# Patient Record
Sex: Male | Born: 1935 | Race: White | Hispanic: No | Marital: Married | Smoking: Former smoker
Health system: Southern US, Community
[De-identification: ages and names within clinical notes are randomized; demographics above are authoritative.]

## PROBLEM LIST (undated history)

## (undated) DIAGNOSIS — G4733 Obstructive sleep apnea (adult) (pediatric): Secondary | ICD-10-CM

## (undated) DIAGNOSIS — E782 Mixed hyperlipidemia: Secondary | ICD-10-CM

## (undated) DIAGNOSIS — N400 Enlarged prostate without lower urinary tract symptoms: Secondary | ICD-10-CM

## (undated) DIAGNOSIS — D649 Anemia, unspecified: Secondary | ICD-10-CM

## (undated) DIAGNOSIS — I10 Essential (primary) hypertension: Secondary | ICD-10-CM

## (undated) DIAGNOSIS — M545 Low back pain, unspecified: Secondary | ICD-10-CM

## (undated) DIAGNOSIS — J309 Allergic rhinitis, unspecified: Secondary | ICD-10-CM

## (undated) DIAGNOSIS — L438 Other lichen planus: Secondary | ICD-10-CM

## (undated) DIAGNOSIS — D126 Benign neoplasm of colon, unspecified: Secondary | ICD-10-CM

## (undated) DIAGNOSIS — M1990 Unspecified osteoarthritis, unspecified site: Secondary | ICD-10-CM

## (undated) DIAGNOSIS — N200 Calculus of kidney: Secondary | ICD-10-CM

## (undated) DIAGNOSIS — J449 Chronic obstructive pulmonary disease, unspecified: Secondary | ICD-10-CM

## (undated) DIAGNOSIS — K5730 Diverticulosis of large intestine without perforation or abscess without bleeding: Secondary | ICD-10-CM

## (undated) DIAGNOSIS — K219 Gastro-esophageal reflux disease without esophagitis: Secondary | ICD-10-CM

## (undated) DIAGNOSIS — K589 Irritable bowel syndrome without diarrhea: Secondary | ICD-10-CM

## (undated) DIAGNOSIS — F419 Anxiety disorder, unspecified: Secondary | ICD-10-CM

## (undated) DIAGNOSIS — K635 Polyp of colon: Secondary | ICD-10-CM

## (undated) DIAGNOSIS — C801 Malignant (primary) neoplasm, unspecified: Secondary | ICD-10-CM

## (undated) DIAGNOSIS — E119 Type 2 diabetes mellitus without complications: Secondary | ICD-10-CM

## (undated) DIAGNOSIS — I2510 Atherosclerotic heart disease of native coronary artery without angina pectoris: Secondary | ICD-10-CM

## (undated) HISTORY — DX: Benign prostatic hyperplasia without lower urinary tract symptoms: N40.0

## (undated) HISTORY — DX: Benign neoplasm of colon, unspecified: D12.6

## (undated) HISTORY — DX: Calculus of kidney: N20.0

## (undated) HISTORY — PX: CHOLECYSTECTOMY: SHX55

## (undated) HISTORY — DX: Obstructive sleep apnea (adult) (pediatric): G47.33

## (undated) HISTORY — DX: Allergic rhinitis, unspecified: J30.9

## (undated) HISTORY — DX: Atherosclerotic heart disease of native coronary artery without angina pectoris: I25.10

## (undated) HISTORY — DX: Mixed hyperlipidemia: E78.2

## (undated) HISTORY — DX: Unspecified osteoarthritis, unspecified site: M19.90

## (undated) HISTORY — DX: Anemia, unspecified: D64.9

## (undated) HISTORY — DX: Chronic obstructive pulmonary disease, unspecified: J44.9

## (undated) HISTORY — DX: Essential (primary) hypertension: I10

## (undated) HISTORY — DX: Low back pain, unspecified: M54.50

## (undated) HISTORY — DX: Type 2 diabetes mellitus without complications: E11.9

## (undated) HISTORY — PX: CATARACT EXTRACTION: SUR2

## (undated) HISTORY — DX: Irritable bowel syndrome, unspecified: K58.9

## (undated) HISTORY — DX: Anxiety disorder, unspecified: F41.9

## (undated) HISTORY — DX: Polyp of colon: K63.5

## (undated) HISTORY — DX: Diverticulosis of large intestine without perforation or abscess without bleeding: K57.30

## (undated) HISTORY — DX: Low back pain: M54.5

## (undated) HISTORY — DX: Gastro-esophageal reflux disease without esophagitis: K21.9

---

## 2000-08-04 ENCOUNTER — Emergency Department (HOSPITAL_COMMUNITY): Admission: EM | Admit: 2000-08-04 | Discharge: 2000-08-04 | Payer: Self-pay | Admitting: Emergency Medicine

## 2000-09-06 ENCOUNTER — Emergency Department (HOSPITAL_COMMUNITY): Admission: EM | Admit: 2000-09-06 | Discharge: 2000-09-06 | Payer: Self-pay | Admitting: Emergency Medicine

## 2001-05-31 ENCOUNTER — Encounter: Admission: RE | Admit: 2001-05-31 | Discharge: 2001-05-31 | Payer: Self-pay | Admitting: Gastroenterology

## 2001-05-31 ENCOUNTER — Encounter (INDEPENDENT_AMBULATORY_CARE_PROVIDER_SITE_OTHER): Payer: Self-pay | Admitting: *Deleted

## 2001-05-31 ENCOUNTER — Encounter: Payer: Self-pay | Admitting: Gastroenterology

## 2001-11-01 ENCOUNTER — Encounter: Payer: Self-pay | Admitting: Gastroenterology

## 2001-11-01 ENCOUNTER — Ambulatory Visit (HOSPITAL_COMMUNITY): Admission: RE | Admit: 2001-11-01 | Discharge: 2001-11-01 | Payer: Self-pay | Admitting: Gastroenterology

## 2001-11-04 ENCOUNTER — Encounter (INDEPENDENT_AMBULATORY_CARE_PROVIDER_SITE_OTHER): Payer: Self-pay | Admitting: *Deleted

## 2001-11-04 ENCOUNTER — Ambulatory Visit (HOSPITAL_COMMUNITY): Admission: RE | Admit: 2001-11-04 | Discharge: 2001-11-04 | Payer: Self-pay | Admitting: Gastroenterology

## 2001-11-04 ENCOUNTER — Encounter: Payer: Self-pay | Admitting: Gastroenterology

## 2003-01-25 ENCOUNTER — Encounter (INDEPENDENT_AMBULATORY_CARE_PROVIDER_SITE_OTHER): Payer: Self-pay | Admitting: Gastroenterology

## 2003-04-27 ENCOUNTER — Ambulatory Visit (HOSPITAL_BASED_OUTPATIENT_CLINIC_OR_DEPARTMENT_OTHER): Admission: RE | Admit: 2003-04-27 | Discharge: 2003-04-27 | Payer: Self-pay | Admitting: Pulmonary Disease

## 2004-07-23 ENCOUNTER — Ambulatory Visit: Payer: Self-pay | Admitting: Pulmonary Disease

## 2004-09-11 ENCOUNTER — Ambulatory Visit: Payer: Self-pay | Admitting: Pulmonary Disease

## 2004-11-07 ENCOUNTER — Ambulatory Visit: Payer: Self-pay | Admitting: Pulmonary Disease

## 2004-11-11 ENCOUNTER — Ambulatory Visit: Payer: Self-pay | Admitting: Pulmonary Disease

## 2004-11-25 ENCOUNTER — Ambulatory Visit: Payer: Self-pay | Admitting: Pulmonary Disease

## 2004-12-19 ENCOUNTER — Ambulatory Visit: Payer: Self-pay | Admitting: Pulmonary Disease

## 2005-05-04 ENCOUNTER — Ambulatory Visit: Payer: Self-pay | Admitting: Pulmonary Disease

## 2005-06-23 ENCOUNTER — Ambulatory Visit: Payer: Self-pay | Admitting: Pulmonary Disease

## 2005-06-29 ENCOUNTER — Ambulatory Visit: Payer: Self-pay | Admitting: Gastroenterology

## 2005-07-02 ENCOUNTER — Ambulatory Visit: Payer: Self-pay | Admitting: Pulmonary Disease

## 2005-07-27 ENCOUNTER — Ambulatory Visit: Payer: Self-pay | Admitting: Gastroenterology

## 2005-08-26 ENCOUNTER — Ambulatory Visit: Payer: Self-pay | Admitting: Pulmonary Disease

## 2005-09-03 ENCOUNTER — Ambulatory Visit: Payer: Self-pay | Admitting: Pulmonary Disease

## 2006-01-04 ENCOUNTER — Ambulatory Visit: Payer: Self-pay | Admitting: Pulmonary Disease

## 2006-01-12 ENCOUNTER — Ambulatory Visit: Payer: Self-pay | Admitting: Pulmonary Disease

## 2006-01-13 ENCOUNTER — Ambulatory Visit: Payer: Self-pay | Admitting: Gastroenterology

## 2006-01-14 ENCOUNTER — Encounter (INDEPENDENT_AMBULATORY_CARE_PROVIDER_SITE_OTHER): Payer: Self-pay | Admitting: *Deleted

## 2006-01-14 ENCOUNTER — Ambulatory Visit: Payer: Self-pay | Admitting: Gastroenterology

## 2006-01-29 ENCOUNTER — Ambulatory Visit: Payer: Self-pay | Admitting: Internal Medicine

## 2006-01-29 ENCOUNTER — Ambulatory Visit: Payer: Self-pay | Admitting: Pulmonary Disease

## 2006-02-15 ENCOUNTER — Ambulatory Visit (HOSPITAL_COMMUNITY): Admission: RE | Admit: 2006-02-15 | Discharge: 2006-02-15 | Payer: Self-pay | Admitting: Internal Medicine

## 2006-02-15 ENCOUNTER — Encounter (INDEPENDENT_AMBULATORY_CARE_PROVIDER_SITE_OTHER): Payer: Self-pay | Admitting: Specialist

## 2006-02-15 ENCOUNTER — Encounter: Payer: Self-pay | Admitting: Internal Medicine

## 2006-02-19 ENCOUNTER — Ambulatory Visit: Payer: Self-pay | Admitting: Internal Medicine

## 2006-03-16 ENCOUNTER — Ambulatory Visit: Payer: Self-pay | Admitting: Pulmonary Disease

## 2006-05-10 ENCOUNTER — Emergency Department (HOSPITAL_COMMUNITY): Admission: EM | Admit: 2006-05-10 | Discharge: 2006-05-10 | Payer: Self-pay | Admitting: Emergency Medicine

## 2006-06-29 ENCOUNTER — Ambulatory Visit: Payer: Self-pay | Admitting: Pulmonary Disease

## 2006-07-20 ENCOUNTER — Ambulatory Visit: Payer: Self-pay | Admitting: Pulmonary Disease

## 2006-07-23 ENCOUNTER — Ambulatory Visit (HOSPITAL_COMMUNITY): Admission: RE | Admit: 2006-07-23 | Discharge: 2006-07-23 | Payer: Self-pay | Admitting: Pulmonary Disease

## 2006-12-27 ENCOUNTER — Ambulatory Visit: Payer: Self-pay | Admitting: Pulmonary Disease

## 2007-01-07 ENCOUNTER — Ambulatory Visit: Payer: Self-pay | Admitting: Pulmonary Disease

## 2007-01-11 ENCOUNTER — Ambulatory Visit: Payer: Self-pay | Admitting: Pulmonary Disease

## 2007-01-11 LAB — CONVERTED CEMR LAB
Albumin: 4.1 g/dL (ref 3.5–5.2)
Basophils Absolute: 0 10*3/uL (ref 0.0–0.1)
Cholesterol: 160 mg/dL (ref 0–200)
Creatinine, Ser: 0.8 mg/dL (ref 0.4–1.5)
GFR calc Af Amer: 123 mL/min
HCT: 43.5 % (ref 39.0–52.0)
HDL: 37.9 mg/dL — ABNORMAL LOW (ref >39.0)
Hemoglobin: 14.7 g/dL (ref 13.0–17.0)
Hgb A1c MFr Bld: 6.9 % — ABNORMAL HIGH (ref 4.6–6.0)
LDL Cholesterol: 103 mg/dL — ABNORMAL HIGH (ref 0–99)
Lymphocytes Relative: 22.8 % (ref 12.0–46.0)
MCHC: 33.7 g/dL (ref 30.0–36.0)
MCV: 87.9 fL (ref 78.0–100.0)
Monocytes Absolute: 0.5 10*3/uL (ref 0.2–0.7)
Neutro Abs: 4.3 10*3/uL (ref 1.4–7.7)
Neutrophils Relative %: 68.3 % (ref 43.0–77.0)
PSA: 0.59 ng/mL (ref 0.10–4.00)
Potassium: 4.4 meq/L (ref 3.5–5.1)
RDW: 12.1 % (ref 11.5–14.6)
Sodium: 145 meq/L (ref 135–145)
Total Bilirubin: 0.8 mg/dL (ref 0.3–1.2)
Total Protein: 7.4 g/dL (ref 6.0–8.3)

## 2007-01-18 ENCOUNTER — Ambulatory Visit: Payer: Self-pay | Admitting: Internal Medicine

## 2007-06-29 ENCOUNTER — Ambulatory Visit: Payer: Self-pay | Admitting: Pulmonary Disease

## 2007-07-13 ENCOUNTER — Ambulatory Visit: Payer: Self-pay | Admitting: Pulmonary Disease

## 2007-07-13 LAB — CONVERTED CEMR LAB
BUN: 15 mg/dL (ref 6–23)
Chloride: 106 meq/L (ref 96–112)
Creatinine, Ser: 0.8 mg/dL (ref 0.4–1.5)
Hgb A1c MFr Bld: 6.7 % — ABNORMAL HIGH (ref 4.6–6.0)
VLDL: 18 mg/dL (ref 0–40)

## 2007-07-22 DIAGNOSIS — I10 Essential (primary) hypertension: Secondary | ICD-10-CM

## 2007-07-22 DIAGNOSIS — K589 Irritable bowel syndrome without diarrhea: Secondary | ICD-10-CM

## 2007-07-22 DIAGNOSIS — G4733 Obstructive sleep apnea (adult) (pediatric): Secondary | ICD-10-CM

## 2007-07-22 DIAGNOSIS — J441 Chronic obstructive pulmonary disease with (acute) exacerbation: Secondary | ICD-10-CM

## 2007-07-22 DIAGNOSIS — Z87442 Personal history of urinary calculi: Secondary | ICD-10-CM

## 2007-07-22 DIAGNOSIS — F411 Generalized anxiety disorder: Secondary | ICD-10-CM

## 2007-07-22 DIAGNOSIS — M545 Low back pain: Secondary | ICD-10-CM

## 2007-07-22 DIAGNOSIS — K219 Gastro-esophageal reflux disease without esophagitis: Secondary | ICD-10-CM

## 2007-07-22 DIAGNOSIS — D126 Benign neoplasm of colon, unspecified: Secondary | ICD-10-CM | POA: Insufficient documentation

## 2007-07-22 DIAGNOSIS — J309 Allergic rhinitis, unspecified: Secondary | ICD-10-CM | POA: Insufficient documentation

## 2007-07-22 DIAGNOSIS — D649 Anemia, unspecified: Secondary | ICD-10-CM | POA: Insufficient documentation

## 2007-07-25 ENCOUNTER — Ambulatory Visit: Payer: Self-pay | Admitting: Pulmonary Disease

## 2007-08-30 ENCOUNTER — Telehealth: Payer: Self-pay | Admitting: Pulmonary Disease

## 2007-09-19 ENCOUNTER — Ambulatory Visit: Payer: Self-pay | Admitting: Pulmonary Disease

## 2007-10-03 ENCOUNTER — Emergency Department (HOSPITAL_COMMUNITY): Admission: EM | Admit: 2007-10-03 | Discharge: 2007-10-04 | Payer: Self-pay | Admitting: Emergency Medicine

## 2007-10-17 ENCOUNTER — Encounter (INDEPENDENT_AMBULATORY_CARE_PROVIDER_SITE_OTHER): Payer: Self-pay | Admitting: *Deleted

## 2007-10-17 ENCOUNTER — Inpatient Hospital Stay (HOSPITAL_COMMUNITY): Admission: EM | Admit: 2007-10-17 | Discharge: 2007-10-20 | Payer: Self-pay | Admitting: Emergency Medicine

## 2007-10-17 ENCOUNTER — Ambulatory Visit: Payer: Self-pay | Admitting: Pulmonary Disease

## 2007-10-20 ENCOUNTER — Encounter (INDEPENDENT_AMBULATORY_CARE_PROVIDER_SITE_OTHER): Payer: Self-pay | Admitting: *Deleted

## 2007-10-21 ENCOUNTER — Telehealth: Payer: Self-pay | Admitting: Pulmonary Disease

## 2007-10-28 ENCOUNTER — Ambulatory Visit: Payer: Self-pay | Admitting: Pulmonary Disease

## 2007-12-14 ENCOUNTER — Encounter: Payer: Self-pay | Admitting: Pulmonary Disease

## 2008-01-11 ENCOUNTER — Ambulatory Visit: Payer: Self-pay | Admitting: Pulmonary Disease

## 2008-01-11 DIAGNOSIS — H9190 Unspecified hearing loss, unspecified ear: Secondary | ICD-10-CM | POA: Insufficient documentation

## 2008-01-11 DIAGNOSIS — K5730 Diverticulosis of large intestine without perforation or abscess without bleeding: Secondary | ICD-10-CM

## 2008-01-15 LAB — CONVERTED CEMR LAB
AST: 22 units/L (ref 0–37)
Basophils Absolute: 0 10*3/uL (ref 0.0–0.1)
Basophils Relative: 0.9 % (ref 0.0–1.0)
Chloride: 108 meq/L (ref 96–112)
Cholesterol: 160 mg/dL (ref 0–200)
Creatinine, Ser: 0.8 mg/dL (ref 0.4–1.5)
Eosinophils Absolute: 0.1 10*3/uL (ref 0.0–0.7)
GFR calc non Af Amer: 102 mL/min
HDL: 25.7 mg/dL — ABNORMAL LOW (ref >39.0)
Hgb A1c MFr Bld: 6.6 % — ABNORMAL HIGH (ref 4.6–6.0)
MCHC: 33.7 g/dL (ref 30.0–36.0)
MCV: 87.9 fL (ref 78.0–100.0)
Neutrophils Relative %: 62.8 % (ref 43.0–77.0)
Platelets: 224 10*3/uL (ref 150–400)
Potassium: 4.6 meq/L (ref 3.5–5.1)
Total Bilirubin: 0.6 mg/dL (ref 0.3–1.2)
Triglycerides: 106 mg/dL (ref 0–149)
VLDL: 21 mg/dL (ref 0–40)

## 2008-01-19 ENCOUNTER — Ambulatory Visit: Payer: Self-pay | Admitting: Internal Medicine

## 2008-01-26 ENCOUNTER — Ambulatory Visit (HOSPITAL_COMMUNITY): Admission: RE | Admit: 2008-01-26 | Discharge: 2008-01-26 | Payer: Self-pay | Admitting: Internal Medicine

## 2008-02-07 ENCOUNTER — Telehealth (INDEPENDENT_AMBULATORY_CARE_PROVIDER_SITE_OTHER): Payer: Self-pay | Admitting: *Deleted

## 2008-02-16 ENCOUNTER — Ambulatory Visit: Payer: Self-pay | Admitting: Internal Medicine

## 2008-06-06 ENCOUNTER — Ambulatory Visit: Payer: Self-pay | Admitting: Pulmonary Disease

## 2008-07-10 ENCOUNTER — Ambulatory Visit: Payer: Self-pay | Admitting: Pulmonary Disease

## 2008-07-10 DIAGNOSIS — E782 Mixed hyperlipidemia: Secondary | ICD-10-CM | POA: Insufficient documentation

## 2008-07-10 DIAGNOSIS — M1990 Unspecified osteoarthritis, unspecified site: Secondary | ICD-10-CM

## 2008-07-24 ENCOUNTER — Telehealth (INDEPENDENT_AMBULATORY_CARE_PROVIDER_SITE_OTHER): Payer: Self-pay | Admitting: *Deleted

## 2008-07-24 LAB — CONVERTED CEMR LAB
BUN: 11 mg/dL (ref 6–23)
CO2: 28 meq/L (ref 19–32)
Chloride: 108 meq/L (ref 96–112)
Cholesterol: 155 mg/dL (ref 0–200)
Creatinine, Ser: 0.7 mg/dL (ref 0.4–1.5)
GFR calc non Af Amer: 118 mL/min
Glucose, Bld: 131 mg/dL — ABNORMAL HIGH (ref 70–99)
LDL Cholesterol: 106 mg/dL — ABNORMAL HIGH (ref 0–99)
Triglycerides: 78 mg/dL (ref 0–149)

## 2008-07-29 DIAGNOSIS — N138 Other obstructive and reflux uropathy: Secondary | ICD-10-CM

## 2008-07-29 DIAGNOSIS — N401 Enlarged prostate with lower urinary tract symptoms: Secondary | ICD-10-CM

## 2008-08-20 ENCOUNTER — Ambulatory Visit: Payer: Self-pay | Admitting: Pulmonary Disease

## 2008-09-26 ENCOUNTER — Encounter (INDEPENDENT_AMBULATORY_CARE_PROVIDER_SITE_OTHER): Payer: Self-pay | Admitting: *Deleted

## 2008-11-12 HISTORY — PX: SKIN CANCER EXCISION: SHX779

## 2008-11-22 ENCOUNTER — Telehealth (INDEPENDENT_AMBULATORY_CARE_PROVIDER_SITE_OTHER): Payer: Self-pay | Admitting: *Deleted

## 2008-12-21 ENCOUNTER — Telehealth: Payer: Self-pay | Admitting: Pulmonary Disease

## 2008-12-30 ENCOUNTER — Emergency Department (HOSPITAL_BASED_OUTPATIENT_CLINIC_OR_DEPARTMENT_OTHER): Admission: EM | Admit: 2008-12-30 | Discharge: 2008-12-31 | Payer: Self-pay | Admitting: Emergency Medicine

## 2008-12-30 ENCOUNTER — Encounter (INDEPENDENT_AMBULATORY_CARE_PROVIDER_SITE_OTHER): Payer: Self-pay | Admitting: *Deleted

## 2008-12-30 ENCOUNTER — Ambulatory Visit: Payer: Self-pay | Admitting: Diagnostic Radiology

## 2009-01-01 ENCOUNTER — Ambulatory Visit: Payer: Self-pay | Admitting: Pulmonary Disease

## 2009-01-01 DIAGNOSIS — K5289 Other specified noninfective gastroenteritis and colitis: Secondary | ICD-10-CM

## 2009-01-09 ENCOUNTER — Encounter (INDEPENDENT_AMBULATORY_CARE_PROVIDER_SITE_OTHER): Payer: Self-pay | Admitting: *Deleted

## 2009-01-09 ENCOUNTER — Ambulatory Visit: Payer: Self-pay | Admitting: Pulmonary Disease

## 2009-01-13 LAB — CONVERTED CEMR LAB
ALT: 17 units/L (ref 0–53)
Alkaline Phosphatase: 77 units/L (ref 39–117)
Basophils Relative: 1 % (ref 0.0–3.0)
Bilirubin, Direct: 0.1 mg/dL (ref 0.0–0.3)
Calcium: 9.2 mg/dL (ref 8.4–10.5)
Creatinine, Ser: 0.7 mg/dL (ref 0.4–1.5)
Eosinophils Absolute: 0 10*3/uL (ref 0.0–0.7)
Eosinophils Relative: 1 % (ref 0.0–5.0)
HDL: 29.5 mg/dL — ABNORMAL LOW (ref >39.00)
LDL Cholesterol: 91 mg/dL (ref 0–99)
Lymphocytes Relative: 23 % (ref 12.0–46.0)
Neutrophils Relative %: 67.5 % (ref 43.0–77.0)
PSA: 1.09 ng/mL (ref 0.10–4.00)
Platelets: 200 10*3/uL (ref 150.0–400.0)
RBC: 4.39 M/uL (ref 4.22–5.81)
Total Bilirubin: 0.7 mg/dL (ref 0.3–1.2)
Total CHOL/HDL Ratio: 5
Total Protein: 7.3 g/dL (ref 6.0–8.3)
Triglycerides: 63 mg/dL (ref 0.0–149.0)
VLDL: 12.6 mg/dL (ref 0.0–40.0)
WBC: 4.7 10*3/uL (ref 4.5–10.5)

## 2009-01-14 ENCOUNTER — Encounter: Payer: Self-pay | Admitting: Pulmonary Disease

## 2009-01-17 ENCOUNTER — Encounter: Payer: Self-pay | Admitting: Pulmonary Disease

## 2009-02-15 ENCOUNTER — Ambulatory Visit: Payer: Self-pay | Admitting: Cardiology

## 2009-02-15 DIAGNOSIS — I2510 Atherosclerotic heart disease of native coronary artery without angina pectoris: Secondary | ICD-10-CM | POA: Insufficient documentation

## 2009-03-12 ENCOUNTER — Ambulatory Visit: Payer: Self-pay | Admitting: Cardiology

## 2009-03-26 ENCOUNTER — Encounter: Payer: Self-pay | Admitting: Emergency Medicine

## 2009-03-26 ENCOUNTER — Ambulatory Visit: Payer: Self-pay | Admitting: Radiology

## 2009-03-27 ENCOUNTER — Ambulatory Visit: Payer: Self-pay | Admitting: Pulmonary Disease

## 2009-03-27 ENCOUNTER — Encounter (INDEPENDENT_AMBULATORY_CARE_PROVIDER_SITE_OTHER): Payer: Self-pay | Admitting: *Deleted

## 2009-03-27 ENCOUNTER — Ambulatory Visit: Payer: Self-pay | Admitting: Internal Medicine

## 2009-03-27 ENCOUNTER — Inpatient Hospital Stay (HOSPITAL_COMMUNITY): Admission: EM | Admit: 2009-03-27 | Discharge: 2009-03-27 | Payer: Self-pay | Admitting: Pulmonary Disease

## 2009-04-15 ENCOUNTER — Ambulatory Visit: Payer: Self-pay | Admitting: Internal Medicine

## 2009-04-15 DIAGNOSIS — K5900 Constipation, unspecified: Secondary | ICD-10-CM | POA: Insufficient documentation

## 2009-04-15 DIAGNOSIS — K3184 Gastroparesis: Secondary | ICD-10-CM

## 2009-04-17 ENCOUNTER — Ambulatory Visit: Payer: Self-pay | Admitting: Pulmonary Disease

## 2009-06-21 ENCOUNTER — Ambulatory Visit: Payer: Self-pay | Admitting: Pulmonary Disease

## 2009-07-11 ENCOUNTER — Ambulatory Visit: Payer: Self-pay | Admitting: Pulmonary Disease

## 2009-07-12 LAB — CONVERTED CEMR LAB
ALT: 17 units/L (ref 0–53)
Albumin: 4.3 g/dL (ref 3.5–5.2)
Basophils Relative: 1 % (ref 0.0–3.0)
Bilirubin, Direct: 0.2 mg/dL (ref 0.0–0.3)
CO2: 23 meq/L (ref 19–32)
Chloride: 105 meq/L (ref 96–112)
Creatinine, Ser: 0.9 mg/dL (ref 0.4–1.5)
Eosinophils Absolute: 0 10*3/uL (ref 0.0–0.7)
Eosinophils Relative: 0.1 % (ref 0.0–5.0)
HCT: 43.8 % (ref 39.0–52.0)
Hgb A1c MFr Bld: 6.5 % (ref 4.6–6.5)
Lymphs Abs: 0.4 10*3/uL — ABNORMAL LOW (ref 0.7–4.0)
MCHC: 33.9 g/dL (ref 30.0–36.0)
MCV: 90.3 fL (ref 78.0–100.0)
Monocytes Absolute: 0.3 10*3/uL (ref 0.1–1.0)
Neutro Abs: 7.9 10*3/uL — ABNORMAL HIGH (ref 1.4–7.7)
Neutrophils Relative %: 90.7 % — ABNORMAL HIGH (ref 43.0–77.0)
Potassium: 4.7 meq/L (ref 3.5–5.1)
RBC: 4.85 M/uL (ref 4.22–5.81)
Sed Rate: 24 mm/hr — ABNORMAL HIGH (ref 0–22)
Total Protein: 7.8 g/dL (ref 6.0–8.3)
WBC: 8.7 10*3/uL (ref 4.5–10.5)

## 2009-07-15 HISTORY — PX: KNEE SURGERY: SHX244

## 2009-07-18 ENCOUNTER — Telehealth: Payer: Self-pay | Admitting: Pulmonary Disease

## 2009-08-05 ENCOUNTER — Telehealth (INDEPENDENT_AMBULATORY_CARE_PROVIDER_SITE_OTHER): Payer: Self-pay | Admitting: *Deleted

## 2009-08-20 ENCOUNTER — Ambulatory Visit: Payer: Self-pay | Admitting: Pulmonary Disease

## 2009-08-20 ENCOUNTER — Telehealth (INDEPENDENT_AMBULATORY_CARE_PROVIDER_SITE_OTHER): Payer: Self-pay | Admitting: *Deleted

## 2009-08-21 ENCOUNTER — Encounter: Payer: Self-pay | Admitting: Pulmonary Disease

## 2009-09-20 ENCOUNTER — Encounter: Payer: Self-pay | Admitting: Pulmonary Disease

## 2009-11-08 ENCOUNTER — Encounter: Payer: Self-pay | Admitting: Cardiology

## 2009-11-11 ENCOUNTER — Ambulatory Visit: Payer: Self-pay | Admitting: Cardiology

## 2009-11-11 DIAGNOSIS — E663 Overweight: Secondary | ICD-10-CM | POA: Insufficient documentation

## 2009-12-12 ENCOUNTER — Telehealth (INDEPENDENT_AMBULATORY_CARE_PROVIDER_SITE_OTHER): Payer: Self-pay | Admitting: *Deleted

## 2010-01-09 ENCOUNTER — Ambulatory Visit: Payer: Self-pay | Admitting: Pulmonary Disease

## 2010-01-11 LAB — CONVERTED CEMR LAB
Alkaline Phosphatase: 87 units/L (ref 39–117)
Basophils Relative: 0.8 % (ref 0.0–3.0)
Bilirubin, Direct: 0.1 mg/dL (ref 0.0–0.3)
CO2: 28 meq/L (ref 19–32)
Calcium: 9.4 mg/dL (ref 8.4–10.5)
Creatinine, Ser: 0.8 mg/dL (ref 0.4–1.5)
Eosinophils Absolute: 0 10*3/uL (ref 0.0–0.7)
GFR calc non Af Amer: 100.91 mL/min (ref >60)
HCT: 40 % (ref 39.0–52.0)
HDL: 38.1 mg/dL — ABNORMAL LOW (ref >39.00)
Hemoglobin: 13.4 g/dL (ref 13.0–17.0)
Hgb A1c MFr Bld: 6.5 % (ref 4.6–6.5)
Lymphocytes Relative: 30 % (ref 12.0–46.0)
Lymphs Abs: 1.3 10*3/uL (ref 0.7–4.0)
MCHC: 33.4 g/dL (ref 30.0–36.0)
Monocytes Relative: 9.1 % (ref 3.0–12.0)
Neutro Abs: 2.6 10*3/uL (ref 1.4–7.7)
RBC: 4.53 M/uL (ref 4.22–5.81)
RDW: 13.3 % (ref 11.5–14.6)
Sodium: 142 meq/L (ref 135–145)
TSH: 1.41 microintl units/mL (ref 0.35–5.50)
Total CHOL/HDL Ratio: 4
Total Protein: 7.4 g/dL (ref 6.0–8.3)
VLDL: 12.2 mg/dL (ref 0.0–40.0)

## 2010-01-16 ENCOUNTER — Encounter: Payer: Self-pay | Admitting: Pulmonary Disease

## 2010-06-06 ENCOUNTER — Ambulatory Visit: Payer: Self-pay | Admitting: Pulmonary Disease

## 2010-07-10 ENCOUNTER — Ambulatory Visit: Payer: Self-pay | Admitting: Pulmonary Disease

## 2010-07-12 LAB — CONVERTED CEMR LAB
BUN: 16 mg/dL (ref 6–23)
Calcium: 9.2 mg/dL (ref 8.4–10.5)
Cholesterol: 155 mg/dL (ref 0–200)
GFR calc non Af Amer: 110.25 mL/min (ref >60)
Glucose, Bld: 118 mg/dL — ABNORMAL HIGH (ref 70–99)
Hgb A1c MFr Bld: 7.1 % — ABNORMAL HIGH (ref 4.6–6.5)

## 2010-10-01 ENCOUNTER — Encounter: Payer: Self-pay | Admitting: Pulmonary Disease

## 2010-10-16 NOTE — Assessment & Plan Note (Signed)
Summary: flu shot ///kp   Nurse Visit Flu Vaccine Consent Questions     Do you have a history of severe allergic reactions to this vaccine? no    Any prior history of allergic reactions to egg and/or gelatin? no    Do you have a sensitivity to the preservative Thimersol? no    Do you have a past history of Guillan-Barre Syndrome? no    Do you currently have an acute febrile illness? no    Have you ever had a severe reaction to latex? no    Vaccine information given and explained to patient? yes    Are you currently pregnant? no    Lot Number:[id number]   Exp Date:03/14/2011   Site Given  Right Deltoid IM .Kandice Hams [facility]  June 06, 2010 3:32 PM    Allergies: 1)  ! Prednisone  Orders Added: 1)  Flu Vaccine 34yrs + MEDICARE PATIENTS [Q2039] 2)  Administration Flu vaccine - MCR [G0008]

## 2010-10-16 NOTE — Assessment & Plan Note (Signed)
Summary: 6 months/apc   Primary Care Provider:  Lorin Picket Nadel,MD  CC:  5 month ROV & review of mult medical problems....  History of Present Illness: 75 y/o WM w/ mult med problems... he has multiple medical problems as noted below...     ~  Apr10:  seen recently in ER w/ ?temp, "felt bad", weak- eval w/ CXR, CT Brain, labs- all neg... given rest, fluids, tylenol and improved... c/o gas and we discussed Mylicon, Gas-X, Phazyme regularly...  CTAbd showed coronary calcifications & this plus his known risk factors>>> referred to Cards for eval.  ~  saw DrHochrein 6/10 w/ normal EKG, neg CV exam- screened w/ treadmill which was adeq & felt to be neg- rec> primary risk factor reduction strategy...  ~  Aug10:  he went to the ER 03/27/09 w/ epig pain, N &V... Xray & Labs neg- consult from DrGessner/ GI felt to be flair of his gastroparesis (given Compazine suppos for Prn use)... he had follow up w/ DrPerry several days ago & his Reglan was discontinued... feels well now- no pain/ discomfort, no n/v/d/c/ or blood seen....   ~  Dec10:  add-on visit for repeat sinus infection- c/o congestion, drainage, etc... prev Rx w/ ZPak, then Avelox, & now rec> sinus films (chr right max sinus), Augmentin, Depo80, Dosepak, Claritin/ Mucinex/ Saline/ Flonase... we will refer to ENT for their opinion (DrShoemaker 1/11> symptoms resolved, routine f/u).   ~  January 09, 2010:  he's had alot going on w/ left knee surg 11/10 DrDuda to "clean it out", the series of 3 "liq cartilage" shots 2/11> starting to feel sl better & incr exercise... has infection in toe on Keflex... had Squamous cell ca removed left hand... prev sinusitis resolved w/ Augmentin & Dosepak... he saw DrHochrein for f/u 2/11> stable, no angina, BP OK, no change in Rx...    Current Problem List:  HEARING LOSS (ICD-389.9) - he has bilat hearing aides...  ALLERGIC RHINITIS (ICD-477.9) - on allergy shots from DrESL Laney Pastor)... plus Claritin, Saline, Mucinex,  Flonase.  OBSTRUCTIVE SLEEP APNEA (ICD-327.23) - sleep study 8/04 showed RDI= 45 & desat to 88%... mod snoring and leg jerks without sleep disruption... CPAP perscribed but not using it now- states he can't sleep w/ it on & furthermore he is resting well as long as he stays on his side... denies daytime hypersomnolence & not interested in re-evaluation.  COPD (ICD-496) - ex-smoker, quit 1981... on ADVAIR 100Bid, PROAIR Prn, MUCINEX Bid... he had Pneumovax in 2008... doesn't like Pred Rx... doing well without cough, sputum, change in dyspnea, etc.  HYPERTENSION (ICD-401.9) - on diet alone, low sodium + ASA 81mg /d... BP= 140/74 here today & even better at home he says... denies HA, fatigue, visual changes, CP, palipit, dizziness, syncope, dyspnea, edema, etc...  R/O CAD (ICD-414.00) - hx neg cardiac cath 1988 by Fransisco Beau; now followed by DrHochrein- notes reviewed.  ~  CT Abd 4/10 via ER showed coronary calcif... referred to Cards.  ~  NuclearStressTest 10/02 was norm- no scar or ischemia, EF=55%.  ~  Eval by DrHochrein 6/10 showed norm EKG & cardiac exam... treadmill ordered.  ~  Treadmill 6/10 showed reasonable exerc tolerance, no ischemic changes, few PVCs, sl incr BP...  ~  2/11:  f/u DrHochrein & stable, no angina, no changes made.  MIXED HYPERLIPIDEMIA (ICD-272.2) - low HLD and NIASPAN 500mg /d started 4/09...  ~  FLP 4/09 showed TChol 160, TG 106, HDL 26, LDL 113... rec- diet + Niaspan 500/d.  ~  FLP 10/09 showed TChol 155, TG 78, HDL 33, LDL 106...  ~  FLP 4/10 showed TChol 133, TG 63, HDL 30, LDL 91... rec> continue same.  ~  FLP 4/11 showed TChol 144, TG 61, HDL 38, LDL 94  DM (ICD-250.00) - on diet + METFORMIN 500mg Bid... he reports BS at home all in the 120-130's...  ~  labs 10/08 showed BS=140, HgA1c=6.7.[name].  ~  labs 4/09 showed BS= 132, HgA1c= 6.6.Marland KitchenMarland Kitchen rec- same med, better diet...  ~  labs 10/09 showed BS= 131, HgA1c= 6.3.Marland KitchenMarland Kitchen  ~  labs 4/10 showed BS= 117, HgA1c= 6.2.[name]. rec> same  meds/ diet Rx.  ~  Dilated eye exam 01/17/09 by DrShapiro- no retinopathy...  ~  labs 10/10 showed BS= 138, A1c= 6.5  ~  labs 4/11 showed BS= 121, A1c= 6.5  GERD (ICD-530.81) & ? GASTROPARESIS - on PROTONIX 40mg /d, & off Reglan per DrPerry...  ~   last EGD 6/07 by DrPerry showed gastic polyp...  ~  8/10:  given Compazine suppos for gastroparesis flair by [employer].  DIVERTICULOSIS OF COLON (ICD-562.10) - he uses MIRALAX Bid regularly... IRRITABLE BOWEL SYNDROME (ICD-564.1) COLONIC POLYPS (ICD-211.3)  ~  last colonoscopy 5/07 by DrSam showed divertics only... f/u 46yrs.  ~  CT Abd 4/10 in ER showed atx bases, coronary calcif, s/p GB, abdAo calcif, 11mm renal stone, divertics, bilat hip AVN.  RENAL CALCULUS, HX OF (ICD-V13.01) BENIGN PROSTATIC HYPERTROPHY, HX OF (ICD-V13.8) - on FLOMAX 0.4mg /d w/ improved symptoms...  DEGENERATIVE JOINT DISEASE (ICD-715.90) - pt states "I have a bum knee" and eval by DrDuda w/ shots in his left shoulder too... note Abd CT w/ bilat hip AVN noted... takes TYLENOL Prn...  ~  11/10:  s/p left knee arthroscopy by DrDuda... BACK PAIN, LUMBAR (ICD-724.2)  ANXIETY (ICD-300.00) - not currently on meds for nerves.  ANEMIA (ICD-285.9) - prev hx of GIB...  ~  labs 4/09 showed Hg= 14.1  ~  labs 4/10 showed Hg= 13.6  ~  labs 4/11 showed Hg= 13.4  Health Maintenance:  ~  Colonoscopy 5/07 by DrSam... f/u planned 5 yrs.  ~  PSA 4/09 = 1.00;  4/10 = 1.09;  PSA 4/11 = 1.02  ~  Immunizations: Pneumovax in 2008 @ age 53;  Tetanus- given 4/10;  had both Flu shots in 2009.     Allergies: 1)  ! Prednisone  Comments:  Nurse/Medical Assistant: The patient's medications and allergies were reviewed with the patient and were updated in the Medication and Allergy Lists.  Past History:  Past Medical History: HEARING LOSS (ICD-389.9) ALLERGIC RHINITIS (ICD-477.9) OBSTRUCTIVE SLEEP APNEA (ICD-327.23) COPD (ICD-496) HYPERTENSION (ICD-401.9) R/O CAD (ICD-414.00) MIXED  HYPERLIPIDEMIA (ICD-272.2) DM (ICD-250.00) GERD (ICD-530.81) DIVERTICULOSIS OF COLON (ICD-562.10) IRRITABLE BOWEL SYNDROME (ICD-564.1) COLONIC POLYPS (ICD-211.3) RENAL CALCULUS, HX OF (ICD-V13.01) BENIGN PROSTATIC HYPERTROPHY, HX OF (ICD-V13.8) DEGENERATIVE JOINT DISEASE (ICD-715.90) BACK PAIN, LUMBAR (ICD-724.2) ANXIETY (ICD-300.00) ANEMIA (ICD-285.9)  GASTROPARESIS GASTRIC POLYPS HIATAL HERNIA  Past Surgical History: S/P cholecystectomy Cataract surgery Arthroscopic knee surgery left  done nov 2010 squamous cell cancer removed from left hand march 2010  Family History: Reviewed history from 04/15/2009 and no changes required. Father died age 37 w/ pneumonia and irreg heat beat Mother died age 80 from stroke, hx DM & DJD 1 Sibling- brother age 21 w/ HBP & borderline DM... No FH of Colon Cancer:  Social History: Reviewed history from 04/15/2009 and no changes required. Married, wife= [place], 48 yrs, No children ex-smoker, quit in 1981 w/ 23yrs 1+ppd retired from  [employer] Use - no  Review of Systems      See HPI       The patient complains of dyspnea on exertion.  The patient denies anorexia, fever, weight loss, weight gain, vision loss, decreased hearing, hoarseness, chest pain, syncope, peripheral edema, prolonged cough, headaches, hemoptysis, abdominal pain, melena, hematochezia, severe indigestion/heartburn, hematuria, incontinence, muscle weakness, suspicious skin lesions, transient blindness, difficulty walking, depression, unusual weight change, abnormal bleeding, enlarged lymph nodes, and angioedema.    Vital Signs:  Patient profile:   75 year old male Height:      75 inches Weight:      226.25 pounds O2 Sat:      96 % on Room air Temp:     97.1 degrees F oral Pulse rate:   74 / minute BP sitting:   140 / 74  (right arm) Cuff size:   regular  Vitals Entered By: Randell Loop CMA (January 09, 2010 9:14 AM)  O2 Sat at Rest %:  96 O2 Flow:   Room air CC: 5 month ROV & review of mult medical problems... Is Patient Diabetic? Yes Pain Assessment Patient in pain? no      Comments meds updated today   Physical Exam  Additional Exam:  WD, WN, 75 y/o WM in NAD... GENERAL:  Alert & oriented; pleasant & cooperative... HEENT:  [place]/AT, EOM-full, PERRLA, EACs-clear, TMs-wnl, NOSE- pale, congested, THROAT-clear & wnl. NECK:  Supple w/ fairROM; no JVD; normal carotid impulses w/o bruits; no thyromegaly or nodules palpated; no lymphadenopathy. CHEST:  Clear to P & A; without wheezes/ rales/ or rhonchi heard... HEART:  Regular Rhythm; without murmurs/ rubs/ or gallops detected...  ABDOMEN:  Soft & nontender; normal bowel sounds; no organomegaly or masses palpated., no guarding, or rebound EXT: without deformities, mild arthritic changes; no varicose veins/ venous insuffic/ or edema. NEURO:  CN's intact;  no focal neuro deficits... DERM:  No lesions noted; no rash etc...    MISC. Report  Procedure date:  01/09/2010  Findings:      BMP (METABOL)   Sodium                    142 mEq/L                   135-145   Potassium                 4.5 mEq/L                   3.5-5.1   Chloride                  108 mEq/L                   96-112   Carbon Dioxide            28 mEq/L                    19-32   Glucose              [H]  121 mg/dL                   16-10   BUN                       15 mg/dL  6-23   Creatinine                0.8 mg/dL                   0.4-5.4   Calcium                   9.4 mg/dL                   0.9-81.1   GFR                       100.91 mL/min               >60  Hepatic/Liver Function Panel (HEPATIC)   Total Bilirubin           0.8 mg/dL                   9.1-4.7   Direct Bilirubin          0.1 mg/dL                   8.2-9.5   Alkaline Phosphatase      87 U/L                      39-117   AST                       22 U/L                      0-37   ALT                       17 U/L                       0-53   Total Protein             7.4 g/dL                    6.2-1.3   Albumin                   4.2 g/dL                    0.8-6.5  CBC Platelet w/Diff (CBCD)   White Cell Count     [L]  4.4 K/uL                    4.5-10.5   Red Cell Count            4.53 Mil/uL                 4.22-5.81   Hemoglobin                13.4 g/dL                   78.4-69.6   Hematocrit                40.0 %                      39.0-52.0   MCV                       88.4 fl  78.0-100.0   Platelet Count            201.0 K/uL                  150.0-400.0   Neutrophil %              59.0 %                      43.0-77.0   Lymphocyte %              30.0 %                      12.0-46.0   Monocyte %                9.1 %                       3.0-12.0   Eosinophils%              1.1 %                       0.0-5.0   Basophils %               0.8 %                       0.0-3.0  Comments:      Lipid Panel (LIPID)   Cholesterol               144 mg/dL                   9-811   Triglycerides             61.0 mg/dL                  9.1-478.2   HDL                  [L]  95.62 mg/dL                 >13.08   LDL Cholesterol           94 mg/dL                    6-57  TSH (TSH)   FastTSH                   1.41 uIU/mL                 0.35-5.50   Hemoglobin A1C (A1C)   Hemoglobin A1C            6.5 %                       4.6-6.5   Prostate Specific Antigen (PSA)   PSA-Hyb                   1.02 ng/mL                  0.10-4.00   Impression & Recommendations:  Problem # 1:  COPD (ICD-496) Stable on Advair, Proair, Mucinex... His updated medication list for this problem includes:    Advair Diskus 100-50 Mcg/dose Misc (Fluticasone-salmeterol) .[name]... Take one puff daily  Problem # 2:  R/O CAD (ICD-414.00) Followed by DrHochrein... BP has been good on diet alone... no angina.Marland KitchenMarland Kitchen  His updated medication list for this problem includes:    Adult Aspirin Ec Low Strength 81 Mg Tbec  (Aspirin) .[name]... Take 1 tablet by mouth once a day  Problem # 3:  MIXED HYPERLIPIDEMIA (ICD-272.2) FLP looks good on med-  continue this + diet/ exerc... His updated medication list for this problem includes:    Niaspan 500 Mg Cr-tabs (Niacin (antihyperlipidemic)) .[name]... Take 1 tab by mouth at bedtime...  Orders: TLB-BMP (Basic Metabolic Panel-BMET) (80048-METABOL) TLB-Hepatic/Liver Function Pnl (80076-HEPATIC) TLB-CBC Platelet - w/Differential (85025-CBCD) TLB-Lipid Panel (80061-LIPID) TLB-TSH (Thyroid Stimulating Hormone) (84443-TSH) TLB-A1C / Hgb A1C (Glycohemoglobin) (83036-A1C) TLB-PSA (Prostate Specific Antigen) (84153-PSA)  Problem # 4:  DM (ICD-250.00) Stable on Metformin... continue same + diet etc... His updated medication list for this problem includes:    Adult Aspirin Ec Low Strength 81 Mg Tbec (Aspirin) .[name]... Take 1 tablet by mouth once a day    Metformin Hcl 500 Mg Tabs (Metformin hcl) .[name]... Take 1 tablet by mouth two times a day  Problem # 5:  GERD (ICD-530.81) GI is stable-  continue Rx. His updated medication list for this problem includes:    Protonix 40 Mg Pack (Pantoprazole sodium) .[name]... Take 1 tablet by mouth once a day    Hyoscyamine Sulfate 0.125 Mg Subl (Hyoscyamine sulfate) .[name]... Place 1 tab under tongue every 6 h as needed for abd cramping...  Problem # 6:  BENIGN PROSTATIC HYPERTROPHY, HX OF (ICD-V13.8) GU stable-  continue med.  Problem # 7:  OTHER MEDICAL PROBLEMS AS NOTED>>>  Complete Medication List: 1)  Allergy Vaccine  .... Every month 2)  Advair Diskus 100-50 Mcg/dose Misc (Fluticasone-salmeterol) .... Take one puff daily 3)  Adult Aspirin Ec Low Strength 81 Mg Tbec (Aspirin) .... Take 1 tablet by mouth once a day 4)  Niaspan 500 Mg Cr-tabs (Niacin (antihyperlipidemic)) .... Take 1 tab by mouth at bedtime.Marland KitchenMarland Kitchen 5)  Metformin Hcl 500 Mg Tabs (Metformin hcl) .... Take 1 tablet by mouth two times a day 6)  Protonix 40 Mg Pack (Pantoprazole sodium)  .... Take 1 tablet by mouth once a day 7)  Miralax Powd (Polyethylene glycol 3350) .... Take as directed two times a day... 8)  Hyoscyamine Sulfate 0.125 Mg Subl (Hyoscyamine sulfate) .... Place 1 tab under tongue every 6 h as needed for abd cramping... 9)  Flomax 0.4 Mg Cp24 (Tamsulosin hcl) .... Take 1 capsule by mouth daily 10)  Centrum Silver Tabs (Multiple vitamins-minerals) .... Take 1 tablet by mouth once a day 11)  Vitamin D 1000 Unit Tabs (Cholecalciferol) .... Daily 12)  Bl Vitamin E 400 Unit Caps (Vitamin e) .... Take 1 tablet by mouth once a day 13)  Onetouch Ultra Test Strp (Glucose blood) .... Test blood glucose daily 14)  Claritin 10 Mg Tabs (Loratadine) .... Take 1 tablet by mouth once a day 15)  Eq Saline Nasal Spray 0.65 % Soln (Saline) .... Spray in each nostril every 1-2 h while awake... 16)  Mucinex Maximum Strength 1200 Mg Xr12h-tab (Guaifenesin) .... Take 1 tab by mouth two times a day w/ plenty of fluids... 17)  Fluticasone Propionate 50 Mcg/act Susp (Fluticasone propionate) .... 2 sp in each nostril at bedtime... 18)  Triamcinolone Acetonide 0.1 % Crea (Triamcinolone acetonide) .... Apply two times a day 19)  Cephalexin 500 Mg Caps (Cephalexin) .... Take one capsule by mouth two times a day until gone  Patient Instructions: 1)  Today we updated your med list- see below.... 2)  Leigh sent your needed  refill in via computer... 3)  Today we did your follow up FASTING blood work...  please call the "phone tree" in a few days for your lab results.Marland KitchenMarland Kitchen 4)  Call for any problems.Marland KitchenMarland Kitchen 5)  Please schedule a follow-up appointment in 6 months, sooner as needed... Prescriptions: ONETOUCH ULTRA TEST  STRP (GLUCOSE BLOOD) test blood glucose daily  #100 x 11   Entered by:   Randell Loop CMA   Authorized by:   Michele Mcalpine MD   Signed by:   Randell Loop CMA on 01/09/2010   Method used:   Electronically to        [facility] (retail)       [address]        [place], [place]  [address]       Ph: [phone] or [phone]       Fax: [phone]   RxID:   [id number] MIRALAX   POWD (POLYETHYLENE GLYCOL 3350) take as directed two times a day...  #2 bottles x prn   Entered by:   Randell Loop CMA   Authorized by:   Michele Mcalpine MD   Signed by:   Randell Loop CMA on 01/09/2010   Method used:   Electronically to        [facility] (retail)       [address]       [place], [place]  [address]       Ph: [phone] or [phone]       Fax: [phone]   RxID:   [id number] PROTONIX 40 MG  PACK (PANTOPRAZOLE SODIUM) Take 1 tablet by mouth once a day  #30 x prn   Entered by:   Randell Loop CMA   Authorized by:   Michele Mcalpine MD   Signed by:   Randell Loop CMA on 01/09/2010   Method used:   Electronically to        [facility] (retail)       [address]       [place], [place]  [address]       Ph: [phone] or [phone]       Fax: [phone]   RxID:   [id number] METFORMIN HCL 500 MG  TABS (METFORMIN HCL) Take 1 tablet by mouth two times a day  #60 x prn   Entered by:   Randell Loop CMA   Authorized by:   Michele Mcalpine MD   Signed by:   Randell Loop CMA on 01/09/2010   Method used:   Electronically to        [facility] (retail)       [address]       [place], [place]  [address]       Ph: [phone] or [phone]       Fax: [phone]   RxID:   [id number]

## 2010-10-16 NOTE — Letter (Signed)
Summary: Recheck Sinusitis/[facility]  Recheck Sinusitis/[facility]   Imported By: Sherian Rein 09/25/2009 14:29:39  _____________________________________________________________________  External Attachment:    Type:   Image     Comment:   External Document

## 2010-10-16 NOTE — Assessment & Plan Note (Signed)
Summary: rov 6 months///kp   Primary Care Provider:  Lorin Picket Nadel,MD  CC:  6 month ROV & review of mult medical problems....  History of Present Illness: 75 y/o WM w/ mult med problems... he has multiple medical problems as noted below...     ~  Apr10:  seen recently in ER w/ ?temp, "felt bad", weak- eval w/ CXR, CT Brain, labs- all neg... given rest, fluids, tylenol and improved... c/o gas and we discussed Mylicon, Gas-X, Phazyme regularly...  CTAbd showed coronary calcifications & this plus his known risk factors>>> referred to Cards for eval.  ~  saw DrHochrein 6/10 w/ normal EKG, neg CV exam- screened w/ treadmill which was adeq & felt to be neg- rec> primary risk factor reduction strategy...  ~  Aug10:  he went to the ER 03/27/09 w/ epig pain, N &V... Xray & Labs neg- consult from DrGessner/ GI felt to be flair of his gastroparesis (given Compazine suppos for Prn use)... he had follow up w/ DrPerry several days ago & his Reglan was discontinued... feels well now- no pain/ discomfort, no n/v/d/c/ or blood seen....  ~  Dec10:  add-on visit for repeat sinus infection- c/o congestion, drainage, etc... prev Rx w/ ZPak, then Avelox, & now rec> sinus films (chr right max sinus), Augmentin, Depo80, Dosepak, Claritin/ Mucinex/ Saline/ Flonase... we will refer to ENT for their opinion (DrShoemaker 1/11> symptoms resolved, routine f/u).   ~  January 09, 2010:  he's had alot going on w/ left knee surg 11/10 DrDuda to "clean it out", the series of 3 "liq cartilage" shots 2/11> starting to feel sl better & incr exercise... has infection in toe on Keflex... had squamous cell ca removed left hand... prev sinusitis resolved w/ Augmentin & Dosepak... he saw DrHochrein for f/u 2/11> stable, no angina, BP OK, no change in Rx...   ~  July 10, 2010:  mult somatic complaints- fell 10/1 w/ left knee injury & cortisone shot helped, c/o head congestion, CWP, sugars sl up, HA, etc... on a pos note- he stopped Vit E &  urine flow is much better...  breathing stable- no ch in DOE;  BP controlled w/ diet, no salt;  Lipids OK on Niaspan;  DM control adeq on Metform;  refills written today.    Current Problem List:  HEARING LOSS (ICD-389.9) - he has bilat hearing aides...  ALLERGIC RHINITIS (ICD-477.9) - on allergy shots from DrESL Laney Pastor)... plus Claritin, Saline, Mucinex, Flonase.  OBSTRUCTIVE SLEEP APNEA (ICD-327.23) - sleep study 8/04 showed RDI= 45 & desat to 88%... mod snoring and leg jerks without sleep disruption... CPAP perscribed but not using it now- states he can't sleep w/ it on & furthermore he is resting well as long as he stays on his side... denies daytime hypersomnolence & not interested in re-evaluation.  COPD (ICD-496) - ex-smoker, quit 1981... on ADVAIR 100Bid, PROAIR Prn, MUCINEX Bid... he had Pneumovax in 2008... doesn't like Pred Rx... doing well except recent cough, min green sputum (no change in dyspnea)- we will Rx w/ Augmentin, incr Mucinex/ Fluids.  HYPERTENSION (ICD-401.9) - on diet alone, low sodium + ASA 81mg /d... BP= 128/80 here today & even better at home he says... denies HA, fatigue, visual changes, CP, palipit, dizziness, syncope, dyspnea, edema, etc...  R/O CAD (ICD-414.00) - hx neg cardiac cath 1988 by Fransisco Beau; now followed by DrHochrein- notes reviewed.  ~  CT Abd 4/10 via ER showed coronary calcif... referred to Cards.  ~  NuclearStressTest 10/02 was  norm- no scar or ischemia, EF=55%.  ~  Eval by DrHochrein 6/10 showed norm EKG & cardiac exam... treadmill ordered.  ~  Treadmill 6/10 showed reasonable exerc tolerance, no ischemic changes, few PVCs, sl incr BP...  ~  2/11:  f/u DrHochrein & stable, no angina, no changes made.  MIXED HYPERLIPIDEMIA (ICD-272.2) - low HLD and NIASPAN 500mg /d started 4/09...  ~  FLP 4/09 showed TChol 160, TG 106, HDL 26, LDL 113... rec- diet + Niaspan 500/d.  ~  FLP 10/09 showed TChol 155, TG 78, HDL 33, LDL 106...  ~  FLP 4/10 showed  TChol 133, TG 63, HDL 30, LDL 91... rec> continue same.  ~  FLP 4/11 showed TChol 144, TG 61, HDL 38, LDL 94  ~  FLP 10/11 showed TChol 155, TG 81, HDL 40, LDL 99  DM (ICD-250.00) - on diet + METFORMIN 500mg Bid... he reports BS at home all in the 120-130's...  ~  labs 10/08 showed BS=140, HgA1c=6.7.[name].  ~  labs 4/09 showed BS= 132, HgA1c= 6.6.Marland KitchenMarland Kitchen rec- same med, better diet...  ~  labs 10/09 showed BS= 131, HgA1c= 6.3.Marland KitchenMarland Kitchen  ~  labs 4/10 showed BS= 117, HgA1c= 6.2.[name]. rec> same meds/ diet Rx.  ~  labs 10/10 showed BS= 138, A1c= 6.5  ~  labs 4/11 showed BS= 121, A1c= 6.5  ~  Dilated eye exam 5/11 by DrShapiro- no retinopathy...  ~  labs 10/11 showed BS= 118, A1c= 7.1.[name]. not as good- get on diet or more meds.  GERD (ICD-530.81) & ? GASTROPARESIS - on PROTONIX 40mg /d, & off Reglan per DrPerry...  ~   last EGD 6/07 by DrPerry showed gastic polyp...  ~  8/10:  given Compazine suppos for gastroparesis flair by [employer].  DIVERTICULOSIS OF COLON (ICD-562.10) - he uses MIRALAX Bid regularly... IRRITABLE BOWEL SYNDROME (ICD-564.1) COLONIC POLYPS (ICD-211.3)  ~  last colonoscopy 5/07 by DrSam showed divertics only... f/u 36yrs.  ~  CT Abd 4/10 in ER showed atx bases, coronary calcif, s/p GB, abdAo calcif, 11mm renal stone, divertics, bilat hip AVN.  RENAL CALCULUS, HX OF (ICD-V13.01) BENIGN PROSTATIC HYPERTROPHY, HX OF (ICD-V13.8) - on FLOMAX 0.4mg /d w/ improved symptoms (he states flow better since stopping Vit E supplement).  DEGENERATIVE JOINT DISEASE (ICD-715.90) - pt states "I have a bum knee" and eval by DrDuda w/ shots in his left shoulder too... note Abd CT w/ bilat hip AVN noted... takes TYLENOL Prn...  ~  11/10:  s/p left knee arthroscopy by DrDuda...  ~  10/11:  s/p fall w/ left knee injury- s/p cortisone shot by DrDuda. BACK PAIN, LUMBAR (ICD-724.2)  ANXIETY (ICD-300.00) - not currently on meds for nerves.  ANEMIA (ICD-285.9) - prev hx of GIB...  ~  labs 4/09 showed Hg= 14.1  ~  labs  4/10 showed Hg= 13.6  ~  labs 4/11 showed Hg= 13.4  Health Maintenance:  ~  Colonoscopy 5/07 by DrSam... f/u planned 5 yrs.  ~  PSA 4/09 = 1.00;  4/10 = 1.09;  PSA 4/11 = 1.02  ~  Immunizations: Pneumovax in 2008 @ age 81;  Tetanus- given 4/10;  he gets yearly seasonal Flu vaccine.   Preventive Screening-Counseling & Management  Alcohol-Tobacco     Smoking Status: quit     Packs/Day: 1.0     Year Quit: 1981     Pack years: 20  Allergies: 1)  ! Prednisone  Comments:  Nurse/Medical Assistant: The patient's medications and allergies were reviewed with the patient and were  updated in the Medication and Allergy Lists.  Past History:  Past Medical History: HEARING LOSS (ICD-389.9) ALLERGIC RHINITIS (ICD-477.9) OBSTRUCTIVE SLEEP APNEA (ICD-327.23) COPD (ICD-496) HYPERTENSION (ICD-401.9) R/O CAD (ICD-414.00) MIXED HYPERLIPIDEMIA (ICD-272.2) DM (ICD-250.00) GERD (ICD-530.81) DIVERTICULOSIS OF COLON (ICD-562.10) IRRITABLE BOWEL SYNDROME (ICD-564.1) COLONIC POLYPS (ICD-211.3) RENAL CALCULUS, HX OF (ICD-V13.01) BENIGN PROSTATIC HYPERTROPHY, HX OF (ICD-V13.8) DEGENERATIVE JOINT DISEASE (ICD-715.90) BACK PAIN, LUMBAR (ICD-724.2) ANXIETY (ICD-300.00) ANEMIA (ICD-285.9)  Past Surgical History: S/P cholecystectomy Cataract surgery Arthroscopic knee surgery left  done nov 2010 squamous cell cancer removed from left hand march 2010  Family History: Reviewed history from 04/15/2009 and no changes required. Father died age 17 w/ pneumonia and irreg heat beat Mother died age 8 from stroke, hx DM & DJD 1 Sibling- brother age 3 w/ HBP & borderline DM... No FH of Colon Cancer:  Social History: Reviewed history from 04/15/2009 and no changes required. Married, wife= Bethany, 48 yrs, No children ex-smoker, quit in 1981 w/ 91yrs 1+ppd retired from [employer] order Alcohol Use - no Packs/Day:  1.0  Review of Systems      See HPI       The patient complains of decreased  hearing, dyspnea on exertion, headaches, and difficulty walking.  The patient denies anorexia, fever, weight loss, weight gain, vision loss, hoarseness, chest pain, syncope, peripheral edema, prolonged cough, hemoptysis, abdominal pain, melena, hematochezia, severe indigestion/heartburn, hematuria, incontinence, muscle weakness, suspicious skin lesions, transient blindness, depression, unusual weight change, abnormal bleeding, enlarged lymph nodes, and angioedema.    Vital Signs:  Patient profile:   75 year old male Height:      75 inches Weight:      228.38 pounds BMI:     28.65 O2 Sat:      97 % on Room air Temp:     97.2 degrees F oral Pulse rate:   72 / minute BP sitting:   128 / 80  (left arm) Cuff size:   regular  Vitals Entered By: Randell Loop CMA (July 10, 2010 8:59 AM)  O2 Sat at Rest %:  97 O2 Flow:  Room air CC: 6 month ROV & review of mult medical problems... Is Patient Diabetic? Yes Pain Assessment Patient in pain? yes      Onset of pain  ha everyday Comments meds updated today with pt   Physical Exam  Additional Exam:  WD, WN, 75 y/o WM in NAD... GENERAL:  Alert & oriented; pleasant & cooperative... HEENT:  [place]/AT, EOM-full, PERRLA, EACs-clear, TMs-wnl, NOSE- pale, congested, THROAT-clear & wnl. NECK:  Supple w/ fairROM; no JVD; normal carotid impulses w/o bruits; no thyromegaly or nodules palpated; no lymphadenopathy. CHEST:  Clear to P & A; without wheezes/ rales/ or rhonchi heard... HEART:  Regular Rhythm; without murmurs/ rubs/ or gallops detected...  ABDOMEN:  Soft & nontender; normal bowel sounds; no organomegaly or masses palpated., no guarding, or rebound EXT: without deformities, mild arthritic changes; no varicose veins/ venous insuffic/ or edema. NEURO:  CN's intact;  no focal neuro deficits... DERM:  No lesions noted; no rash etc...    MISC. Report  Procedure date:  07/10/2010  Findings:      BMP (METABOL)   Sodium               [L]  133  mEq/L                   135-145   Potassium  4.5 mEq/L                   3.5-5.1   Chloride                  101 mEq/L                   96-112   Carbon Dioxide            25 mEq/L                    19-32   Glucose              [H]  118 mg/dL                   16-10   BUN                       16 mg/dL                    9-60   Creatinine                0.7 mg/dL                   4.5-4.0   Calcium                   9.2 mg/dL                   9.8-11.9   GFR                       110.25 mL/min               >60  Lipid Panel (LIPID)   Cholesterol               155 mg/dL                   1-478   Triglycerides             81.0 mg/dL                  2.9-562.1   HDL                       30.86 mg/dL                 >57.84   LDL Cholesterol           99 mg/dL                    6-96  Hemoglobin A1C (A1C)   Hemoglobin A1C       [H]  7.1 %                       4.6-6.5   Impression & Recommendations:  Problem # 1:  ACUTE SINUSITIS, UNSPECIFIED (ICD-461.9) We will Rx acute symptoms w/ Augmentin, Mucinex, Saline nasal mist etc... His updated medication list for this problem includes:    Fluticasone Propionate 50 Mcg/act Susp (Fluticasone propionate) .[name]... 2 sp in each nostril at bedtime...    Eq Saline Nasal Spray 0.65 % Soln (Saline) ..... Spray in each nostril every 1-2 h while awake...    Mucinex Maximum Strength 1200 Mg Xr12h-tab (Guaifenesin) .[name]... Take 1 tab by mouth two times a day w/  plenty of fluids...    Amoxicillin-pot Clavulanate 875-125 Mg Tabs (Amoxicillin-pot clavulanate) .[name]... Take 1 tab by mouth two times a day for sinus infection  Orders: TLB-BMP (Basic Metabolic Panel-BMET) (80048-METABOL) TLB-Lipid Panel (80061-LIPID) TLB-A1C / Hgb A1C (Glycohemoglobin) (83036-A1C)  Problem # 2:  COPD (ICD-496) Stable>  continue current RX. His updated medication list for this problem includes:    Advair Diskus 100-50 Mcg/dose Misc (Fluticasone-salmeterol) .[name]... Take  one puff two times a day...  Problem # 3:  HYPERTENSION (ICD-401.9) BP stable on diet, no salt alone... keep wt down.  Problem # 4:  MIXED HYPERLIPIDEMIA (ICD-272.2) Continue low chol/ low fat diet, get wt down... His updated medication list for this problem includes:    Niaspan 500 Mg Cr-tabs (Niacin (antihyperlipidemic)) .[name]... Take 1 tab by mouth at bedtime...  Problem # 5:  DM (ICD-250.00) A1c is up to 7.1 & may need incr meds- for now needs better diet, same Metformin, get wt down. His updated medication list for this problem includes:    Adult Aspirin Ec Low Strength 81 Mg Tbec (Aspirin) .[name]... Take 1 tablet by mouth once a day    Metformin Hcl 500 Mg Tabs (Metformin hcl) .[name]... Take 1 tablet by mouth two times a day  Problem # 6:  GERD (ICD-530.81) Gi per DrPerry et al... continue his Rx... His updated medication list for this problem includes:    Protonix 40 Mg Pack (Pantoprazole sodium) .[name]... Take 1 tablet by mouth once a day    Hyoscyamine Sulfate 0.125 Mg Subl (Hyoscyamine sulfate) .[name]... Place 1 tab under tongue every 6 h as needed for abd cramping...  Problem # 7:  DEGENERATIVE JOINT DISEASE (ICD-715.90) Eval & treatment from DrDuda... continue as directed... His updated medication list for this problem includes:    Adult Aspirin Ec Low Strength 81 Mg Tbec (Aspirin) .[name]... Take 1 tablet by mouth once a day  Problem # 8:  OTHER MEDICAL PROBLEMS AS NOTED>>>  Complete Medication List: 1)  Allergy Vaccine  .... Every month 2)  Claritin 10 Mg Tabs (Loratadine) .... Take 1 tablet by mouth once a day 3)  Fluticasone Propionate 50 Mcg/act Susp (Fluticasone propionate) .... 2 sp in each nostril at bedtime.Marland KitchenMarland Kitchen 4)  Eq Saline Nasal Spray 0.65 % Soln (Saline) .... Spray in each nostril every 1-2 h while awake... 5)  Advair Diskus 100-50 Mcg/dose Misc (Fluticasone-salmeterol) .... Take one puff two times a day... 6)  Mucinex Maximum Strength 1200 Mg Xr12h-tab (Guaifenesin) .... Take 1 tab  by mouth two times a day w/ plenty of fluids.Marland KitchenMarland Kitchen 7)  Adult Aspirin Ec Low Strength 81 Mg Tbec (Aspirin) .... Take 1 tablet by mouth once a day 8)  Niaspan 500 Mg Cr-tabs (Niacin (antihyperlipidemic)) .... Take 1 tab by mouth at bedtime.Marland KitchenMarland Kitchen 9)  Metformin Hcl 500 Mg Tabs (Metformin hcl) .... Take 1 tablet by mouth two times a day 10)  Protonix 40 Mg Pack (Pantoprazole sodium) .... Take 1 tablet by mouth once a day 11)  Hyoscyamine Sulfate 0.125 Mg Subl (Hyoscyamine sulfate) .... Place 1 tab under tongue every 6 h as needed for abd cramping... 12)  Miralax Powd (Polyethylene glycol 3350) .... Take as directed two times a day... 13)  Flomax 0.4 Mg Cp24 (Tamsulosin hcl) .... Take 1 capsule by mouth daily 14)  Centrum Silver Tabs (Multiple vitamins-minerals) .... Take 1 tablet by mouth once a day 15)  Vitamin D 1000 Unit Tabs (Cholecalciferol) .... Daily 16)  Onetouch Ultra Test Strp (Glucose blood) .Marland KitchenMarland KitchenMarland Kitchen  Test blood glucose daily 17)  Triamcinolone Acetonide 0.1 % Crea (Triamcinolone acetonide) .... Apply two times a day 18)  Amoxicillin-pot Clavulanate 875-125 Mg Tabs (Amoxicillin-pot clavulanate) .... Take 1 tab by mouth two times a day for sinus infection 19)  Shingles Vaccine...  .... Administer shingles vaccine please...  Patient Instructions: 1)  Today we updated your med list- see below.... 2)  We refilled the meds you requested and wrote a new perscription for AUGMENTIN antibiotic to take as needed for sinus infection.Marland KitchenMarland Kitchen 3)  Today we did your follow up FASTING blood work... please call the "phone tree" in a few days for your lab results.Marland KitchenMarland Kitchen 4)  Stay as active as poss, and get on that diet you've been meaning to start!!! 5)  Call for any questions.Marland KitchenMarland Kitchen 6)  Please schedule a follow-up appointment in 6 months. Prescriptions: SHINGLES VACCINE... administer Shingles vaccine please...  #1 x 0   Entered and Authorized by:   Michele Mcalpine MD   Signed by:   Michele Mcalpine MD on 07/10/2010   Method used:    Print then Give to Patient   RxID:   [id number] AMOXICILLIN-POT CLAVULANATE 875-125 MG TABS (AMOXICILLIN-POT CLAVULANATE) take 1 tab by mouth two times a day for sinus infection  #20 x 1   Entered and Authorized by:   Michele Mcalpine MD   Signed by:   Michele Mcalpine MD on 07/10/2010   Method used:   Print then Give to Patient   RxID:   [id number] FLOMAX 0.4 MG CP24 (TAMSULOSIN HCL) Take 1 capsule by mouth daily  #30 x 12   Entered and Authorized by:   Michele Mcalpine MD   Signed by:   Michele Mcalpine MD on 07/10/2010   Method used:   Print then Give to Patient   RxID:   [id number] FLUTICASONE PROPIONATE 50 MCG/ACT SUSP (FLUTICASONE PROPIONATE) 2 sp in each nostril at bedtime...  #1 x 12   Entered and Authorized by:   Michele Mcalpine MD   Signed by:   Michele Mcalpine MD on 07/10/2010   Method used:   Print then Give to Patient   RxID:   [id number] ADVAIR DISKUS 100-50 MCG/DOSE  MISC (FLUTICASONE-SALMETEROL) take one puff two times a day...  #1 x 12   Entered and Authorized by:   Michele Mcalpine MD   Signed by:   Michele Mcalpine MD on 07/10/2010   Method used:   Print then Give to Patient   RxID:   [id number]    Immunization History:  Pneumovax Immunization History:    Pneumovax:  historical (06/30/2006)

## 2010-10-16 NOTE — Miscellaneous (Signed)
  Clinical Lists Changes  Observations: Added new observation of ETTFINDING:  Quality of ETT:   diagnostic    ETT Interpretation:   normal-no evidence of ischemia by ST analysis    Comments:     The patient had a reasonable exercise tolerance.  He reached the target heart rate.  There were no ischemic ST/T wave changes or arrhythmias.  He mild chest tightness and appropriate dyspnea.  He had a normal heart rate recovery.  He had a slightly accelrated BP response.    Recommendations:   Negative adequate ETT.  No further cardiovascular studies are indicated.  He should continue with primary risk reduction. He needs particular attention paid to his BP. Based on this study, I gave him a prescription for exercise.  (03/12/2009 11:27)      Exercise Stress Test  Procedure date:  03/12/2009  Findings:       Quality of ETT:   diagnostic    ETT Interpretation:   normal-no evidence of ischemia by ST analysis    Comments:     The patient had a reasonable exercise tolerance.  He reached the target heart rate.  There were no ischemic ST/T wave changes or arrhythmias.  He mild chest tightness and appropriate dyspnea.  He had a normal heart rate recovery.  He had a slightly accelrated BP response.    Recommendations:   Negative adequate ETT.  No further cardiovascular studies are indicated.  He should continue with primary risk reduction. He needs particular attention paid to his BP. Based on this study, I gave him a prescription for exercise.

## 2010-10-16 NOTE — Assessment & Plan Note (Signed)
Summary: rov. gd  Medications Added HYDROCODONE-ACETAMINOPHEN 5-500 MG TABS (HYDROCODONE-ACETAMINOPHEN) as needed      Allergies Added:   Visit Type:  Follow-up Referring Provider:  n/a Primary Provider:  Lorin Picket Nadel,MD  CC:  chest pain.  History of Present Illness: The patient presents for followup of his coronary calcification. At the last visit I performed an exercise treadmill test. There was no evidence of high-grade obstructive coronary disease. Since that time he has done relatively well. He has had arthroscopic surgery on his knee and cartilage injections. He still has knee discomfort and is slow to rehabilitation. These had a couple episodes of fleeting mid sternal chest discomfort. One was about 3 weeks ago after an injection in his knee. This was mild and not associated with other symptoms. He came and went spontaneously. He's not had any sustained substernal chest pressure. He has no symptoms reproducible with activity. He has no shortness of breath, PND or orthopnea. He said no palpitations, presyncope or syncope.  Current Medications (verified): 1)  Allergy Vaccine .... Every Month 2)  Advair Diskus 100-50 Mcg/dose  Misc (Fluticasone-Salmeterol) .... Take One Puff Daily 3)  Adult Aspirin Ec Low Strength 81 Mg  Tbec (Aspirin) .... Take 1 Tablet By Mouth Once A Day 4)  Niaspan 500 Mg Cr-Tabs (Niacin (Antihyperlipidemic)) .... Take 1 Tab By Mouth At Bedtime.Marland KitchenMarland Kitchen 5)  Metformin Hcl 500 Mg  Tabs (Metformin Hcl) .... Take 1 Tablet By Mouth Two Times A Day 6)  Protonix 40 Mg  Pack (Pantoprazole Sodium) .... Take 1 Tablet By Mouth Once A Day 7)  Miralax   Powd (Polyethylene Glycol 3350) .... Take As Directed Two Times A Day... 8)  Flomax 0.4 Mg Cp24 (Tamsulosin Hcl) .... Take 1 Capsule By Mouth Daily 9)  Centrum Silver   Tabs (Multiple Vitamins-Minerals) .... Take 1 Tablet By Mouth Once A Day 10)  Vitamin D 1000 Unit  Tabs (Cholecalciferol) .... Daily 11)  Bl Vitamin E 400 Unit  Caps  (Vitamin E) .... Take 1 Tablet By Mouth Once A Day 12)  Onetouch Ultra Test  Strp (Glucose Blood) .... Test Blood Glucose Daily 13)  Prochlorperazine 25 Mg Supp (Prochlorperazine) .... Insert One Suppository Every 6 H As Needed For Nausea... 14)  Hyoscyamine Sulfate 0.125 Mg Subl (Hyoscyamine Sulfate) .... Place 1 Tab Under Tongue Every 6 H As Needed For Abd Cramping... 15)  Claritin 10 Mg Tabs (Loratadine) .... Take 1 Tablet By Mouth Once A Day 16)  Eq Saline Nasal Spray 0.65 % Soln (Saline) .... Spray in Each Nostril Every 1-2 H While Awake... 17)  Mucinex Maximum Strength 1200 Mg Xr12h-Tab (Guaifenesin) .... Take 1 Tab By Mouth Two Times A Day W/ Plenty of Fluids... 18)  Fluticasone Propionate 50 Mcg/act Susp (Fluticasone Propionate) .... 2 Sp in Each Nostril At Bedtime... 19)  Hydrocodone-Acetaminophen 5-500 Mg Tabs (Hydrocodone-Acetaminophen) .... As Needed  Allergies (verified): 1)  ! Prednisone  Past History:  Past Medical History: Reviewed history from 08/20/2009 and no changes required. HEARING LOSS (ICD-389.9) ALLERGIC RHINITIS (ICD-477.9) OBSTRUCTIVE SLEEP APNEA (ICD-327.23) COPD (ICD-496) HYPERTENSION (ICD-401.9) R/O CAD (ICD-414.00) MIXED HYPERLIPIDEMIA (ICD-272.2) DM (ICD-250.00) GERD (ICD-530.81) DIVERTICULOSIS OF COLON (ICD-562.10) IRRITABLE BOWEL SYNDROME (ICD-564.1) COLONIC POLYPS (ICD-211.3) RENAL CALCULUS, HX OF (ICD-V13.01) BENIGN PROSTATIC HYPERTROPHY, HX OF (ICD-V13.8) DEGENERATIVE JOINT DISEASE (ICD-715.90) BACK PAIN, LUMBAR (ICD-724.2) ANXIETY (ICD-300.00) ANEMIA (ICD-285.9)  GASTROPARESIS GASTRIC POLYPS HIATAL HERNIA  Past Surgical History: S/P cholecystectomy Cataract surgery Arthroscopic knee surgery left  Review of Systems  As stated in the HPI and negative for all other systems.   Vital Signs:  Patient profile:   75 year old male Height:      75 inches Weight:      222 pounds BMI:     27.85 Pulse rate:   79 / minute BP  sitting:   148 / 78  (right arm) Cuff size:   regular  Vitals Entered By: Hardin Negus, RMA (November 11, 2009 4:26 PM)  Physical Exam  General:  Well developed, well nourished, no acute distress. Head:  Normocephalic and atraumatic. Eyes:  PERRLA/EOM intact; conjunctiva and lids normal. Neck:  Neck supple, no JVD. No masses, thyromegaly or abnormal cervical nodes. Chest Wall:  no deformities or breast masses noted Lungs:  Clear throughout to auscultation. Heart:  Regular rate and rhythm; no murmurs, rubs,  or bruits. Abdomen:  Bowel sounds positive; abdomen soft and non-tender without masses, organomegaly, or hernias noted. No hepatosplenomegaly, obese Msk:  Symmetrical with no gross deformities. Normal posture. Pulses:  pulses normal in all 4 extremities Extremities:  No clubbing or cyanosis. Neurologic:  Alert and oriented x 3. Psych:  Normal affect.   EKG  Procedure date:  11/11/2009  Findings:      sinus rhythm, rate 78, axis within normal limits, intervals within normal limits, no acute ST-T wave changes.  Impression & Recommendations:  Problem # 1:  CORONARY ATHEROSCLEROSIS NATIVE CORONARY ARTERY (ICD-414.01) The patient does have coronary calcification involves as evidence of some coronary plaque. However, there is no evidence of obstructive plaque. We have discussed this. At this point he needs no further testing that needs to continue with risk reduction. His updated medication list for this problem includes:    Adult Aspirin Ec Low Strength 81 Mg Tbec (Aspirin) .[name]... Take 1 tablet by mouth once a day  Problem # 2:  HYPERTENSION (ICD-401.9) Today his blood pressure is unusually high. He understands therapeutic lifestyle changes. He can keep a blood pressure diary. It is not stay below 140/90 need further therapy.  Problem # 3:  OVERWEIGHT (ICD-278.02) He knows that he needs weight loss. Hopefully he will be able to exercise more as he is knee gets better. This will  help his blood pressure as well.  Other Orders: EKG w/ Interpretation (93000)  Patient Instructions: 1)  Your physician recommends that you schedule a follow-up appointment in: 1 yr rov 2)  Your physician recommends that you continue on your current medications as directed. Please refer to the Current Medication list given to you today.

## 2010-10-16 NOTE — Progress Notes (Signed)
Summary: rx  Phone Note Call from Patient Call back at Home Phone [phone]   Caller: Patient Call For: nadel Reason for Call: Talk to Nurse Summary of Call: would like to get something called in to pharmacy.  pt has congestion and sinus drainage.  Taking Mucinex and clariton. [facility] Initial call taken by: Eugene Gavia,  December 12, 2009 8:51 AM  Follow-up for Phone Call        Called, spoke with pt.  Pt c/o sinus drainage with clear and yellow mucus, burning throat, and head congestion since Monday.  Requesting [facility] be [facility] to [facility].  Will forward to SN - pls advise.  Thanks! Allergies - Prednisone.   Follow-up by: Gweneth Dimitri RN,  December 12, 2009 9:03 AM  Additional Follow-up for Phone Call Additional follow up Details #1::        per SN---otc mucinex 1200 mg by mouth two times a day with plenty of fluids, otc nasal saline spray every 1-2 hours during the day, rx for astepro #1  2 sprays in each nostril every morning, zpak #1  take as directed. Randell Loop CMA  December 12, 2009 9:09 AM     Additional Follow-up for Phone Call Additional follow up Details #2::    called, spoke with pt.  Pt informed of above recs per SN and aware astepro and zpak sent to [facility] [address]  He verbalized understanding.  Follow-up by: Gweneth Dimitri RN,  December 12, 2009 9:32 AM  New/Updated Medications: ASTEPRO 0.15 % SOLN (AZELASTINE HCL) 2 sprays in each nostril every morning ZITHROMAX Z-PAK 250 MG TABS (AZITHROMYCIN) take as directed Prescriptions: ZITHROMAX Z-PAK 250 MG TABS (AZITHROMYCIN) take as directed  #1 x 0   Entered by:   Gweneth Dimitri RN   Authorized by:   Michele Mcalpine MD   Signed by:   Gweneth Dimitri RN on 12/12/2009   Method used:   Electronically to        [id number]       [address]       [place], [place]  [address]       Ph: [phone] or [phone]       Fax: [phone]   RxID:   [id number] ASTEPRO 0.15 % SOLN  (AZELASTINE HCL) 2 sprays in each nostril every morning  #1 x 0   Entered by:   Gweneth Dimitri RN   Authorized by:   Michele Mcalpine MD   Signed by:   Gweneth Dimitri RN on 12/12/2009   Method used:   Electronically to        [id number] ([facility])       [address]       [place], [place]  [address]       Ph: [phone] or [phone]       Fax: [phone]   RxID:   [id number]

## 2010-10-16 NOTE — Letter (Signed)
Summary: Geralynn Rile  [facility]   Imported By: Sherian Rein 01/29/2010 14:28:25  _____________________________________________________________________  External Attachment:    Type:   Image     Comment:   External Document

## 2010-10-21 ENCOUNTER — Encounter: Payer: Self-pay | Admitting: Adult Health

## 2010-10-21 ENCOUNTER — Ambulatory Visit (INDEPENDENT_AMBULATORY_CARE_PROVIDER_SITE_OTHER): Payer: Medicare Other | Admitting: Adult Health

## 2010-10-21 ENCOUNTER — Other Ambulatory Visit: Payer: Medicare Other

## 2010-10-21 ENCOUNTER — Encounter (INDEPENDENT_AMBULATORY_CARE_PROVIDER_SITE_OTHER): Payer: Self-pay | Admitting: *Deleted

## 2010-10-21 ENCOUNTER — Other Ambulatory Visit: Payer: Self-pay | Admitting: Adult Health

## 2010-10-21 ENCOUNTER — Telehealth: Payer: Self-pay | Admitting: Cardiology

## 2010-10-21 DIAGNOSIS — I10 Essential (primary) hypertension: Secondary | ICD-10-CM

## 2010-10-21 LAB — BASIC METABOLIC PANEL
CO2: 28 mEq/L (ref 19–32)
Chloride: 106 mEq/L (ref 96–112)
Potassium: 4.5 mEq/L (ref 3.5–5.1)
Sodium: 141 mEq/L (ref 135–145)

## 2010-10-22 NOTE — Miscellaneous (Signed)
Summary: [facility]  [facility]   Imported By: Lester [place] 10/14/2010 10:54:00  _____________________________________________________________________  External Attachment:    Type:   Image     Comment:   External Document

## 2010-10-30 NOTE — Progress Notes (Signed)
Summary: B/P running high this morning  needs appt with SW  lm   Phone Note Call from Patient Call back at [facility] Phone [phone]   Caller: Patient Summary of Call: Pt B/P running high this morning 163/82 has been yesterday 179/85 and 135/74 and 131/76. Initial call taken by: Judie Grieve,  October 21, 2010 8:24 AM  Follow-up for Phone Call        Spoke with pt who reports he starting feeling bad Sunday evening.  Describes as "feeling odd" with pressure in head and chest. Did not take blood pressure at that time. Took tylenol which helped and went to bed. Early last week blood pressure was 131/68 and on Saturday February 4 it was 135/74. Monday AM continued to feel bad. Blood pressure at that time 164/80. Had chest pressure and headache which continued throughout day.  No radiation of pain. Took tylenol twice yesterday which helped. States he does have nausea at times which he sees Dr. Marina Goodell for. This AM blood pressure is 169/82. Head and chest pressure not as bad this AM.  Took blood pressure while speaking with me and it was 173/98. Will discuss with Dr. Antoine Poche. Follow-up by: Dossie Arbour, RN, BSN,  October 21, 2010 9:14 AM  Additional Follow-up for Phone Call Additional follow up Details #1::        pt rtn call Glynda Jaeger  October 21, 2010 1:40 PM  Per Dr Antoine Poche who reviewed information - pt to be seen either by Tereso Newcomer, PA, his primary care MD or be seen in the [facility] office tomorrow.  Left message for pt to call back  pt called back and gave him above info-he doesn't want to go to [facility], he wiil call his pcp and if he can't get in , he call me back to schedule with scott Glynda Jaeger  October 21, 2010 3:01 PM  Additional Follow-up by: Charolotte Capuchin, RN,  October 21, 2010 2:22 PM

## 2010-10-30 NOTE — Assessment & Plan Note (Signed)
Summary: Acute NP office visit - HTN   Copy to:  n/a Primary Provider/Referring Provider:  Lorin Picket Nadel,MD  CC:  HTN at 182/75 at 1300 and 179/78 at 1430 today.  History of Present Illness: 75  year old male patient has a known history of diabetes, hypertension, hyperlipidemia, and esophageal reflux     October 21, 2010--Presents for work in visit. Complains of elevated blood pressure.  B/P were up  182/75 at 1300, 179/78 at 1430 today . Has had a cold and drainge for last week. Taking zyrtec, denies decongestants. Has had a mild headache , took tylenol which helped. No visual/speech changes, no ext weakness. Felt some chest pressure yesterday for short time , none today.  No palpitations or exertional chest pain.  Denies, dyspnea, orthopnea, hemoptysis, fever, n/v/d, edema.  Medications Prior to Update: 1)  Allergy Vaccine .... Every Month 2)  Claritin 10 Mg Tabs (Loratadine) .... Take 1 Tablet By Mouth Once A Day 3)  Fluticasone Propionate 50 Mcg/act Susp (Fluticasone Propionate) .... 2 Sp in Each Nostril At Bedtime.Marland KitchenMarland Kitchen 4)  Eq Saline Nasal Spray 0.65 % Soln (Saline) .... Spray in Each Nostril Every 1-2 H While Awake... 5)  Advair Diskus 100-50 Mcg/dose  Misc (Fluticasone-Salmeterol) .... Take One Puff Two Times A Day... 6)  Mucinex Maximum Strength 1200 Mg Xr12h-Tab (Guaifenesin) .... Take 1 Tab By Mouth Two Times A Day W/ Plenty of Fluids.Marland KitchenMarland Kitchen 7)  Adult Aspirin Ec Low Strength 81 Mg  Tbec (Aspirin) .... Take 1 Tablet By Mouth Once A Day 8)  Niaspan 500 Mg Cr-Tabs (Niacin (Antihyperlipidemic)) .... Take 1 Tab By Mouth At Bedtime.Marland KitchenMarland Kitchen 9)  Metformin Hcl 500 Mg  Tabs (Metformin Hcl) .... Take 1 Tablet By Mouth Two Times A Day 10)  Protonix 40 Mg  Pack (Pantoprazole Sodium) .... Take 1 Tablet By Mouth Once A Day 11)  Hyoscyamine Sulfate 0.125 Mg Subl (Hyoscyamine Sulfate) .... Place 1 Tab Under Tongue Every 6 H As Needed For Abd Cramping... 12)  Miralax   Powd (Polyethylene Glycol 3350) .... Take  As Directed Two Times A Day... 13)  Flomax 0.4 Mg Cp24 (Tamsulosin Hcl) .... Take 1 Capsule By Mouth Daily 14)  Centrum Silver   Tabs (Multiple Vitamins-Minerals) .... Take 1 Tablet By Mouth Once A Day 15)  Vitamin D 1000 Unit  Tabs (Cholecalciferol) .... Daily 16)  Onetouch Ultra Test  Strp (Glucose Blood) .... Test Blood Glucose Daily 17)  Triamcinolone Acetonide 0.1 % Crea (Triamcinolone Acetonide) .... Apply Two Times A Day 18)  Amoxicillin-Pot Clavulanate 875-125 Mg Tabs (Amoxicillin-Pot Clavulanate) .... Take 1 Tab By Mouth Two Times A Day For Sinus Infection 19)  Shingles Vaccine... .... Administer Shingles Vaccine Please...  Current Medications (verified): 1)  Allergy Vaccine .... Every Month 2)  Claritin 10 Mg Tabs (Loratadine) .... Take 1 Tablet By Mouth Once A Day 3)  Fluticasone Propionate 50 Mcg/act Susp (Fluticasone Propionate) .... 2 Sp in Each Nostril At Bedtime.Marland KitchenMarland Kitchen 4)  Eq Saline Nasal Spray 0.65 % Soln (Saline) .... Spray in Each Nostril Every 1-2 H While Awake... 5)  Advair Diskus 100-50 Mcg/dose  Misc (Fluticasone-Salmeterol) .... Take One Puff Two Times A Day... 6)  Mucinex Maximum Strength 1200 Mg Xr12h-Tab (Guaifenesin) .... Take 1 Tab By Mouth Two Times A Day W/ Plenty of Fluids.Marland KitchenMarland Kitchen 7)  Adult Aspirin Ec Low Strength 81 Mg  Tbec (Aspirin) .... Take 1 Tablet By Mouth Once A Day 8)  Niaspan 500 Mg Cr-Tabs (Niacin (Antihyperlipidemic)) .... Take 1  Tab By Mouth At Bedtime.Marland KitchenMarland Kitchen 9)  Metformin Hcl 500 Mg  Tabs (Metformin Hcl) .... Take 1 Tablet By Mouth Two Times A Day 10)  Protonix 40 Mg  Pack (Pantoprazole Sodium) .... Take 1 Tablet By Mouth Once A Day 11)  Hyoscyamine Sulfate 0.125 Mg Subl (Hyoscyamine Sulfate) .... Place 1 Tab Under Tongue Every 6 H As Needed For Abd Cramping... 12)  Miralax   Powd (Polyethylene Glycol 3350) .... Take As Directed Two Times A Day... 13)  Flomax 0.4 Mg Cp24 (Tamsulosin Hcl) .... Take 1 Capsule By Mouth Daily 14)  Centrum Silver   Tabs (Multiple  Vitamins-Minerals) .... Take 1 Tablet By Mouth Once A Day 15)  Vitamin D 1000 Unit  Tabs (Cholecalciferol) .... Daily 16)  Onetouch Ultra Test  Strp (Glucose Blood) .... Test Blood Glucose Daily 17)  Triamcinolone Acetonide 0.1 % Crea (Triamcinolone Acetonide) .... Apply Two Times A Day 18)  Shingles Vaccine... .... Administer Shingles Vaccine Please...  Allergies (verified): 1)  ! Prednisone  Past History:  Past Medical History: Last updated: 07/10/2010 HEARING LOSS (ICD-389.9) ALLERGIC RHINITIS (ICD-477.9) OBSTRUCTIVE SLEEP APNEA (ICD-327.23) COPD (ICD-496) HYPERTENSION (ICD-401.9) R/O CAD (ICD-414.00) MIXED HYPERLIPIDEMIA (ICD-272.2) DM (ICD-250.00) GERD (ICD-530.81) DIVERTICULOSIS OF COLON (ICD-562.10) IRRITABLE BOWEL SYNDROME (ICD-564.1) COLONIC POLYPS (ICD-211.3) RENAL CALCULUS, HX OF (ICD-V13.01) BENIGN PROSTATIC HYPERTROPHY, HX OF (ICD-V13.8) DEGENERATIVE JOINT DISEASE (ICD-715.90) BACK PAIN, LUMBAR (ICD-724.2) ANXIETY (ICD-300.00) ANEMIA (ICD-285.9)  Past Surgical History: Last updated: 07/10/2010 S/P cholecystectomy Cataract surgery Arthroscopic knee surgery left  done nov 2010 squamous cell cancer removed from left hand march 2010  Family History: Last updated: 04/23/2009 Father died age 63 w/ pneumonia and irreg heat beat Mother died age 40 from stroke, hx DM & DJD 1 Sibling- brother age 39 w/ HBP & borderline DM... No FH of Colon Cancer:  Social History: Last updated: 2009/04/23 Married, wife= Louise, 48 yrs, No children ex-smoker, quit in 1981 w/ 81yrs 1+ppd retired from [employer] Alcohol Use - no  Risk Factors: Smoking Status: quit (07/10/2010) Packs/Day: 1.0 (07/10/2010)  Review of Systems      See HPI  Vital Signs:  Patient profile:   75 year old male Height:      75 inches Weight:      233.13 pounds BMI:     29.24 O2 Sat:      98 % on Room air Temp:     97.9 degrees F oral Pulse rate:   76 / minute BP sitting:   156 / 84   (left arm) Cuff size:   regular  Vitals Entered By: Boone Master CNA/MA (October 21, 2010 3:44 PM)  O2 Flow:  Room air CC: HTN at 182/75 at 1300, 179/78 at 1430 today Is Patient Diabetic? No Comments Medications reviewed with patient Daytime contact number verified with patient. Boone Master CNA/MA  October 21, 2010 3:44 PM    Physical Exam  Additional Exam:  WD, WN, 75 y/o WM in NAD... GENERAL:  Alert & oriented; pleasant & cooperative... HEENT:  [place]/AT, PERRLA, EOMI,  EACs-clear, TMs-wnl, NOSE-clear, THROAT-clear & wnl. NECK:  Supple w/ fairROM; no JVD; normal carotid impulses w/o bruits; no thyromegaly or nodules palpated; no lymphadenopathy. CHEST:  Clear to P & A; without wheezes/ rales/ or rhonchi heard... HEART:  Regular Rhythm; without murmurs/ rubs/ or gallops detected...  ABDOMEN:  Soft & nontender; normal bowel sounds; no organomegaly or masses palpated., no guarding, or rebound EXT: without deformities, mild arthritic changes; no varicose veins/ venous insuffic/ or  edema. NEURO:  CN's intact;  no focal neuro deficits...nml gait,neg rhomberg.nml grips, MAEWx 4  DERM:  No lesions noted; no rash etc...     Impression & Recommendations:  Problem # 1:  HYPERTENSION (ICD-401.9)  elevated blood pressure with assoicated mild headache.  exam unrevealing. EKG with no acute changes.  Plan:  Begin Diovan 160mg  once daily - samples given Low salt diet  Check blood pressure once daily and keep log -bring to next office visit.-call if blood pressure is >160/90. follow up 4 weeks and as needed  Please contact office for sooner follow up if symptoms do not improve or worsen  His updated medication list for this problem includes:    Diovan 160 Mg Tabs (Valsartan) .[name]... 1 by mouth once daily  Orders: TLB-BMP (Basic Metabolic Panel-BMET) (80048-METABOL) TLB-BNP (B-Natriuretic Peptide) (83880-BNPR) Est. Patient Level IV (95621)  Medications Added to Medication List This  Visit: 1)  Diovan 160 Mg Tabs (Valsartan) .[name].. 1 by mouth once daily  Patient Instructions: 1)  Begin Diovan 160mg  once daily - samples given 2)  Low salt diet  3)  Check blood pressure once daily and keep log -bring to next office visit.-call if blood pressure is >160/90. 4)  follow up 4 weeks and as needed  5)  Please contact office for sooner follow up if symptoms do not improve or worsen

## 2010-11-13 ENCOUNTER — Encounter: Payer: Self-pay | Admitting: Cardiology

## 2010-11-13 ENCOUNTER — Encounter (INDEPENDENT_AMBULATORY_CARE_PROVIDER_SITE_OTHER): Payer: Medicare Other | Admitting: Cardiology

## 2010-11-13 DIAGNOSIS — I10 Essential (primary) hypertension: Secondary | ICD-10-CM

## 2010-11-13 DIAGNOSIS — I2510 Atherosclerotic heart disease of native coronary artery without angina pectoris: Secondary | ICD-10-CM

## 2010-11-13 DIAGNOSIS — E782 Mixed hyperlipidemia: Secondary | ICD-10-CM

## 2010-11-13 NOTE — Progress Notes (Unsigned)
HPI  Allergies  Allergen Reactions  . Prednisone     REACTION: high dose intolerance    Current Outpatient Prescriptions on File Prior to Visit  Medication Sig Dispense Refill  . aspirin 81 MG tablet Take 81 mg by mouth daily.        . cholecalciferol (VITAMIN D) 1000 UNITS tablet Take 1,000 Units by mouth daily.        . fluticasone (FLONASE) 50 MCG/ACT nasal spray 2 sprays by Nasal route daily. As needed       . fluticasone-salmeterol (ADVAIR HFA) 115-21 MCG/ACT inhaler Inhale 2 puffs into the lungs 2 (two) times daily. One puff twice a day       . Guaifenesin (MUCINEX MAXIMUM STRENGTH) 1200 MG TB12 Take by mouth.        . hyoscyamine (LEVSIN SL) 0.125 MG SL tablet Place 0.125 mg under the tongue every 4 (four) hours as needed. As needed for abdominal cramping       . loratadine (CLARITIN REDITABS) 10 MG dissolvable tablet Take 10 mg by mouth daily.        . metFORMIN (GLUCOPHAGE) 500 MG tablet Take 500 mg by mouth 2 (two) times daily with a meal.        . metFORMIN (GLUMETZA) 1000 MG (MOD) 24 hr tablet Take 500 mg by mouth daily with breakfast.        . Multiple Vitamins-Minerals (MULTIVITAMIN WITH MINERALS) tablet Take 1 tablet by mouth daily.        . niacin (NIASPAN) 500 MG CR tablet Take 500 mg by mouth at bedtime.        . pantoprazole (PROTONIX) 40 MG tablet Take 40 mg by mouth daily.        . polyethylene glycol (MIRALAX) powder Take 17 g by mouth daily. Take as directed       . triamcinolone (KENALOG) 0.1 % cream Apply topically 2 (two) times daily. As directed       . valsartan (DIOVAN) 160 MG tablet Take 160 mg by mouth daily.          No past medical history on file.  No past surgical history on file.  ROS  There were no vitals taken for this visit.  PHYSICAL EXAM

## 2010-11-13 NOTE — Assessment & Plan Note (Signed)
I reviewed his last lipid profile. I will make no changes to his regimen. The goal should be an LDL less than 100 and HDL greater than 40

## 2010-11-13 NOTE — Assessment & Plan Note (Signed)
His blood pressure is still not quite at target. I will change his Diovan to Diovan HCT 160/12.5 he will keep his blood pressure diary.

## 2010-11-13 NOTE — Progress Notes (Unsigned)
Subjective:      Patient ID: Jesse Santos is a 75 y.o. male.  Chief Complaint: HPI {Common ambulatory SmartLinks:19316} ROS    Objective:    Physical Exam  Lab Review:  {Recent labs:19471::"not applicable"}    Assessment:     No diagnosis found.   Plan:     ***    

## 2010-11-13 NOTE — Progress Notes (Signed)
HPI  The Presents for evaluation of his difficult to control hypertension. Since I last saw him he had an episode on the day of the [employer] Bowl of some chest tightness. The next day he had some shoulder discomfort. He was also noted to have blood pressure systolic in the 180s.  He presented to his primary physician where he did have Diovan. He has since had no further chest pressure, neck or arm discomfort. He has had no new shortness of breath, PND or orthopnea. He is trying to walk every day. He has had an extensive blood pressure diary. I have reviewed this with him in detail. His blood pressure systolic particularly in the 150s. Diastolics are well controlled. He had no extreme exacerbation.  Allergies  Allergen Reactions  . Prednisone     REACTION: high dose intolerance    Current Outpatient Prescriptions on File Prior to Visit  Medication Sig Dispense Refill  . aspirin 81 MG tablet Take 81 mg by mouth daily.        . cholecalciferol (VITAMIN D) 1000 UNITS tablet Take 1,000 Units by mouth daily.        . fluticasone (FLONASE) 50 MCG/ACT nasal spray 2 sprays by Nasal route daily. As needed       . fluticasone-salmeterol (ADVAIR HFA) 115-21 MCG/ACT inhaler Inhale 2 puffs into the lungs 2 (two) times daily. One puff twice a day       . Guaifenesin (MUCINEX MAXIMUM STRENGTH) 1200 MG TB12 Take by mouth.        . hyoscyamine (LEVSIN SL) 0.125 MG SL tablet Place 0.125 mg under the tongue every 4 (four) hours as needed. As needed for abdominal cramping       . loratadine (CLARITIN REDITABS) 10 MG dissolvable tablet Take 10 mg by mouth daily.        . metFORMIN (GLUCOPHAGE) 500 MG tablet Take 500 mg by mouth 2 (two) times daily with a meal.        . metFORMIN (GLUMETZA) 1000 MG (MOD) 24 hr tablet Take 500 mg by mouth daily with breakfast.        . Multiple Vitamins-Minerals (MULTIVITAMIN WITH MINERALS) tablet Take 1 tablet by mouth daily.        . niacin (NIASPAN) 500 MG CR tablet Take 500 mg by  mouth at bedtime.        . pantoprazole (PROTONIX) 40 MG tablet Take 40 mg by mouth daily.        . polyethylene glycol (MIRALAX) powder Take 17 g by mouth daily. Take as directed       . triamcinolone (KENALOG) 0.1 % cream Apply topically 2 (two) times daily. As directed       . valsartan (DIOVAN) 160 MG tablet Take 160 mg by mouth daily.          Past Medical History  Diagnosis Date  . Hearing loss   . Allergic rhinitis, cause unspecified   . Obstructive sleep apnea (adult) (pediatric)   . Chronic airway obstruction, not elsewhere classified   . Unspecified essential hypertension   . Mixed hyperlipidemia   . Coronary artery disease   . Type II or unspecified type diabetes mellitus without mention of complication, not stated as uncontrolled   . Esophageal reflux   . Diverticulosis of colon (without mention of hemorrhage)   . Irritable bowel syndrome   . Benign neoplasm of colon   . Renal calculus   . BPH (benign prostatic hypertrophy)   .  Degenerative joint disease   . Lumbar back pain   . Anxiety   . Anemia     Past Surgical History  Procedure Date  . Cholecystectomy   . Cataract extraction   . Skin cancer excision     left hand march 2010    ROS Negative for all other systems except as stated in the history of present illness  BP 142/74  Pulse 79  Ht 6\' 3"  (1.905 m)  Wt 230 lb 8 oz (104.554 kg)  BMI 28.81 kg/m2  PHYSICAL EXAM HEENT:  Pupils equal round and reactive, fundi not visualized, oral mucosa unremarkable NECK:  No jugular venous distention, waveform within normal limits, carotid upstroke brisk and symmetric, no bruits, no thyromegaly LYMPHATICS:  No cervical, axillary adenopathy LUNGS:  Clear to auscultation bilaterally BACK:  No CVA tenderness CHEST:  Unremarkable HEART:  PMI not displaced or sustained,,S1 and S2 within normal limits, no S3, no S4, no clicks, no rubs, no murmurs ABD:  Flat, positive bowel sounds normal in frequency in pitch, no  bruits, no rebound, no guarding, no midline pulsatile mass, no hepatomegaly, no splenomegaly EXT:  2 plus pulses throughout, no edema, no cyanosis no clubbing SKIN:  No rashes no nodules NEURO:  Cranial nerves II through XII grossly intact, motor grossly intact throughout PSYCH:  Cognitively intact, oriented to person place and time

## 2010-11-13 NOTE — Assessment & Plan Note (Addendum)
Given his Symptoms and suggestion of coronary calcification in the past I will bring him in when exercise treadmill test

## 2010-11-13 NOTE — Progress Notes (Deleted)
Subjective:      Patient ID: Jesse Santos is a 75 y.o. male.  Chief Complaint: HPI {Common ambulatory SmartLinks:19316} ROS    Objective:    Physical Exam  Lab Review:  {Recent labs:19471::"not applicable"}    Assessment:     No diagnosis found.   Plan:     ***

## 2010-11-18 ENCOUNTER — Ambulatory Visit: Payer: Medicare Other | Admitting: Adult Health

## 2010-11-20 NOTE — Progress Notes (Signed)
Summary: [facility]  [facility]   Imported By: Marylou Mccoy 11/13/2010 15:28:12  _____________________________________________________________________  External Attachment:    Type:   Image     Comment:   External Document

## 2010-11-20 NOTE — Assessment & Plan Note (Signed)
Summary: 1 yr rov 414.01 pfh.sp  Medications Added DIOVAN HCT 160-12.5 MG TABS (VALSARTAN-HYDROCHLOROTHIAZIDE) one daily        Visit Type:  75 year follow up Referring Provider:  n/a Primary Provider:  Lorin Picket Nadel,MD  CC:  BP issues.  History of Present Illness: Please see note done in the EPIC system.  Current Medications (verified): 1)  Allergy Vaccine .... Every Month 2)  Claritin 10 Mg Tabs (Loratadine) .... Take 1 Tablet By Mouth Once A Day 3)  Fluticasone Propionate 50 Mcg/act Susp (Fluticasone Propionate) .... 2 Sp in Each Nostril At Bedtime.Marland KitchenMarland Kitchen 4)  Eq Saline Nasal Spray 0.65 % Soln (Saline) .... Spray in Each Nostril Every 1-2 H While Awake... 5)  Advair Diskus 100-50 Mcg/dose  Misc (Fluticasone-Salmeterol) .... Take One Puff Two Times A Day... 6)  Mucinex Maximum Strength 1200 Mg Xr12h-Tab (Guaifenesin) .... Take 1 Tab By Mouth Two Times A Day W/ Plenty of Fluids.Marland KitchenMarland Kitchen 7)  Adult Aspirin Ec Low Strength 81 Mg  Tbec (Aspirin) .... Take 1 Tablet By Mouth Once A Day 8)  Niaspan 500 Mg Cr-Tabs (Niacin (Antihyperlipidemic)) .... Take 1 Tab By Mouth At Bedtime.Marland KitchenMarland Kitchen 9)  Metformin Hcl 500 Mg  Tabs (Metformin Hcl) .... Take 1 Tablet By Mouth Two Times A Day 10)  Protonix 40 Mg  Pack (Pantoprazole Sodium) .... Take 1 Tablet By Mouth Once A Day 11)  Hyoscyamine Sulfate 0.125 Mg Subl (Hyoscyamine Sulfate) .... Place 1 Tab Under Tongue Every 6 H As Needed For Abd Cramping... 12)  Miralax   Powd (Polyethylene Glycol 3350) .... Take As Directed Two Times A Day... 13)  Flomax 0.4 Mg Cp24 (Tamsulosin Hcl) .... Take 1 Capsule By Mouth Daily 14)  Centrum Silver   Tabs (Multiple Vitamins-Minerals) .... Take 1 Tablet By Mouth Once A Day 15)  Vitamin D 1000 Unit  Tabs (Cholecalciferol) .... Daily 16)  Onetouch Ultra Test  Strp (Glucose Blood) .... Test Blood Glucose Daily 17)  Triamcinolone Acetonide 0.1 % Crea (Triamcinolone Acetonide) .... Apply Two Times A Day 18)  Shingles Vaccine... ....  Administer Shingles Vaccine Please... 19)  Diovan 160 Mg Tabs (Valsartan) .[name].. 1 By Mouth Once Daily  Allergies: 1)  ! Prednisone  Past History:  Past Medical History: Last updated: 07/10/2010 HEARING LOSS (ICD-389.9) ALLERGIC RHINITIS (ICD-477.9) OBSTRUCTIVE SLEEP APNEA (ICD-327.23) COPD (ICD-496) HYPERTENSION (ICD-401.9) R/O CAD (ICD-414.00) MIXED HYPERLIPIDEMIA (ICD-272.2) DM (ICD-250.00) GERD (ICD-530.81) DIVERTICULOSIS OF COLON (ICD-562.10) IRRITABLE BOWEL SYNDROME (ICD-564.1) COLONIC POLYPS (ICD-211.3) RENAL CALCULUS, HX OF (ICD-V13.01) BENIGN PROSTATIC HYPERTROPHY, HX OF (ICD-V13.8) DEGENERATIVE JOINT DISEASE (ICD-715.90) BACK PAIN, LUMBAR (ICD-724.2) ANXIETY (ICD-300.00) ANEMIA (ICD-285.9)  Vital Signs:  Patient profile:   75 year old male Height:      75 inches Weight:      230.50 pounds BMI:     28.91 Pulse rate:   79 / minute Pulse rhythm:   regular Resp:     18 per minute BP sitting:   142 / 74  (left arm) Cuff size:   large  Vitals Entered By: Vikki Ports (November 13, 2010 9:32 AM)  Serial Vital Signs/Assessments:  Time      Position  BP       Pulse  Resp  Temp     By           R Arm     135/76                         Doree Fudge  Jaramillo    Other Orders: EKG w/ Interpretation (93000) Treadmill (Treadmill)  Patient Instructions: 1)  Your physician recommends that you schedule a follow-up appointment at the time of your treadmill with Dr Antoine Poche in April 2)  Your physician recommends that you return for lab work in:  April (same day as treadmill) BMP 401.1 v58.69 3)  Your physician has recommended you make the following change in your medication: Start Diovan 160mg  and start Diovan/HTCZ 160/12.5 mg once a day 4)  Your physician has requested that you have an exercise tolerance test.  For further information please visit [email or web address]  Please also follow instruction sheet, as given. Prescriptions: DIOVAN HCT 160-12.5 MG TABS  (VALSARTAN-HYDROCHLOROTHIAZIDE) one daily  #30 x 11   Entered by:   Charolotte Capuchin, RN   Authorized by:   Rollene Rotunda, MD, [facility]   Signed by:   Charolotte Capuchin, RN on 11/13/2010   Method used:   Electronically to        [facility] (retail)       [address]       [place], [place]  [address]       Ph: [phone] or [phone]       Fax: [phone]   RxID:   [id number]

## 2010-12-03 ENCOUNTER — Encounter: Payer: Self-pay | Admitting: *Deleted

## 2010-12-03 ENCOUNTER — Telehealth: Payer: Self-pay | Admitting: *Deleted

## 2010-12-03 ENCOUNTER — Telehealth: Payer: Self-pay | Admitting: Cardiology

## 2010-12-03 DIAGNOSIS — I10 Essential (primary) hypertension: Secondary | ICD-10-CM

## 2010-12-03 MED ORDER — VALSARTAN 320 MG PO TABS: 320.0000 mg | ORAL_TABLET | Freq: Every day | ORAL | Status: DC

## 2010-12-03 NOTE — Telephone Encounter (Signed)
Per Dr Antoine Poche - pt is to stop Diovan/HCT and start Diovan 320 mg once a day.  Pt is aware and RX sent to [facility]

## 2010-12-03 NOTE — Telephone Encounter (Signed)
See previous phone note.  

## 2010-12-03 NOTE — Telephone Encounter (Signed)
Per pt call - states since starting Diovan/HCT he has been having having trouble urinating like before he was started on Flomax.  Pt reports that his BP is good at 136/69.  Will ask Dr Antoine Poche and call pt back with instructions.

## 2010-12-07 NOTE — Progress Notes (Signed)
This encounter was created in error - please disregard.

## 2010-12-21 LAB — CBC
Platelets: 200 10*3/uL (ref 150–400)
RDW: 12.9 % (ref 11.5–15.5)

## 2010-12-21 LAB — DIFFERENTIAL
Basophils Absolute: 0.1 10*3/uL (ref 0.0–0.1)
Basophils Relative: 1 % (ref 0–1)
Lymphocytes Relative: 8 % — ABNORMAL LOW (ref 12–46)
Neutro Abs: 13.5 10*3/uL — ABNORMAL HIGH (ref 1.7–7.7)
Neutrophils Relative %: 87 % — ABNORMAL HIGH (ref 43–77)

## 2010-12-21 LAB — PROTIME-INR: INR: 1 (ref 0.00–1.49)

## 2010-12-21 LAB — APTT: aPTT: 33 seconds (ref 24–37)

## 2010-12-21 LAB — POCT CARDIAC MARKERS
CKMB, poc: <1 ng/mL — ABNORMAL LOW (ref 1.0–8.0)
Myoglobin, poc: 50.1 ng/mL (ref 12–200)
Troponin i, poc: <0.05 ng/mL (ref 0.00–0.09)

## 2010-12-21 LAB — COMPREHENSIVE METABOLIC PANEL
ALT: 10 U/L (ref 0–53)
Albumin: 4.4 g/dL (ref 3.5–5.2)
Alkaline Phosphatase: 116 U/L (ref 39–117)
GFR calc Af Amer: >60 mL/min (ref >60)
Potassium: 4.2 mEq/L (ref 3.5–5.1)
Sodium: 144 mEq/L (ref 135–145)
Total Protein: 7.8 g/dL (ref 6.0–8.3)

## 2010-12-21 LAB — LIPASE, BLOOD: Lipase: 40 U/L (ref 23–300)

## 2010-12-22 LAB — DIFFERENTIAL
Basophils Absolute: 0 10*3/uL (ref 0.0–0.1)
Basophils Relative: 0 % (ref 0–1)
Eosinophils Relative: 1 % (ref 0–5)
Monocytes Absolute: 0.5 10*3/uL (ref 0.1–1.0)
Monocytes Relative: 4 % (ref 3–12)

## 2010-12-22 LAB — COMPREHENSIVE METABOLIC PANEL
AST: 20 U/L (ref 0–37)
Albumin: 3.5 g/dL (ref 3.5–5.2)
Alkaline Phosphatase: 70 U/L (ref 39–117)
Chloride: 109 mEq/L (ref 96–112)
GFR calc Af Amer: >60 mL/min (ref >60)
Potassium: 4.3 mEq/L (ref 3.5–5.1)
Sodium: 140 mEq/L (ref 135–145)
Total Bilirubin: 0.6 mg/dL (ref 0.3–1.2)
Total Protein: 6.6 g/dL (ref 6.0–8.3)

## 2010-12-22 LAB — CARDIAC PANEL(CRET KIN+CKTOT+MB+TROPI)
Relative Index: INVALID (ref 0.0–2.5)
Troponin I: 0.01 ng/mL (ref 0.00–0.06)

## 2010-12-22 LAB — CBC
Platelets: 184 10*3/uL (ref 150–400)
RDW: 13.5 % (ref 11.5–15.5)
WBC: 11.1 10*3/uL — ABNORMAL HIGH (ref 4.0–10.5)

## 2010-12-22 LAB — HEMOGLOBIN A1C
Hgb A1c MFr Bld: 5.8 % (ref 4.6–6.1)
Mean Plasma Glucose: 120 mg/dL

## 2010-12-22 LAB — GLUCOSE, CAPILLARY: Glucose-Capillary: 147 mg/dL — ABNORMAL HIGH (ref 70–99)

## 2010-12-24 ENCOUNTER — Encounter: Payer: Self-pay | Admitting: Cardiology

## 2010-12-24 LAB — BASIC METABOLIC PANEL
BUN: 15 mg/dL (ref 6–23)
CO2: 25 mEq/L (ref 19–32)
Glucose, Bld: 164 mg/dL — ABNORMAL HIGH (ref 70–99)
Potassium: 4.4 mEq/L (ref 3.5–5.1)
Sodium: 140 mEq/L (ref 135–145)

## 2010-12-24 LAB — DIFFERENTIAL
Basophils Absolute: 0 10*3/uL (ref 0.0–0.1)
Basophils Relative: 0 % (ref 0–1)
Eosinophils Absolute: 0.1 10*3/uL (ref 0.0–0.7)
Eosinophils Relative: 1 % (ref 0–5)
Lymphocytes Relative: 4 % — ABNORMAL LOW (ref 12–46)
Monocytes Absolute: 0.3 10*3/uL (ref 0.1–1.0)

## 2010-12-24 LAB — URINALYSIS, ROUTINE W REFLEX MICROSCOPIC
Bilirubin Urine: NEGATIVE
Ketones, ur: NEGATIVE mg/dL
Nitrite: NEGATIVE
Specific Gravity, Urine: 1.024 (ref 1.005–1.030)
Urobilinogen, UA: 1 mg/dL (ref 0.0–1.0)
pH: 7 (ref 5.0–8.0)

## 2010-12-24 LAB — URINE MICROSCOPIC-ADD ON

## 2010-12-24 LAB — CBC
HCT: 42.1 % (ref 39.0–52.0)
Hemoglobin: 13.8 g/dL (ref 13.0–17.0)
MCHC: 32.6 g/dL (ref 30.0–36.0)
MCV: 89.5 fL (ref 78.0–100.0)
Platelets: 196 10*3/uL (ref 150–400)
RDW: 12.2 % (ref 11.5–15.5)

## 2010-12-28 ENCOUNTER — Encounter: Payer: Self-pay | Admitting: Cardiology

## 2010-12-29 ENCOUNTER — Ambulatory Visit (INDEPENDENT_AMBULATORY_CARE_PROVIDER_SITE_OTHER): Payer: Medicare Other | Admitting: Cardiology

## 2010-12-29 ENCOUNTER — Other Ambulatory Visit (INDEPENDENT_AMBULATORY_CARE_PROVIDER_SITE_OTHER): Payer: Medicare Other | Admitting: *Deleted

## 2010-12-29 DIAGNOSIS — I10 Essential (primary) hypertension: Secondary | ICD-10-CM

## 2010-12-29 DIAGNOSIS — Z79899 Other long term (current) drug therapy: Secondary | ICD-10-CM

## 2010-12-29 DIAGNOSIS — N529 Male erectile dysfunction, unspecified: Secondary | ICD-10-CM

## 2010-12-29 LAB — BASIC METABOLIC PANEL
CO2: 25 mEq/L (ref 19–32)
Calcium: 9.3 mg/dL (ref 8.4–10.5)
Creatinine, Ser: 0.9 mg/dL (ref 0.4–1.5)
GFR: 88.98 mL/min (ref >60.00)
Sodium: 138 mEq/L (ref 135–145)

## 2010-12-29 MED ORDER — SILDENAFIL CITRATE 50 MG PO TABS: 50.0000 mg | ORAL_TABLET | Freq: Every day | ORAL | Status: DC | PRN

## 2010-12-29 MED ORDER — AMLODIPINE BESYLATE 2.5 MG PO TABS: 2.5000 mg | ORAL_TABLET | Freq: Every day | ORAL | Status: DC

## 2010-12-29 NOTE — Progress Notes (Signed)
Addended by: Julieta Gutting on: 12/29/2010 03:41 PM   Modules accepted: Orders

## 2010-12-29 NOTE — Progress Notes (Signed)
Exercise Treadmill Test  Pre-Exercise Testing Evaluation Rhythm: normal sinus  Rate: 83   PR:  .15 QRS:  .10  QT:  .35 QTc: .42    Test  Exercise Tolerance Test Ordering MD: Angelina Sheriff, MD  Interpreting MD:  Angelina Sheriff, MD  Unique Test No: 1  Treadmill:  1  Indication for ETT: chest pain - rule out ischemia  Contraindication to ETT: No   Stress Modality: exercise - treadmill  Cardiac Imaging Performed: non   Protocol: standard Bruce - maximal  Max BP:  217/52  Max MPHR (bpm):  147 85% MPR (bpm):  124  MPHR obtained (bpm):  146 % MPHR obtained:  99  Reached 85% MPHR (min:sec):  3:27 Total Exercise Time (min-sec):  6:00  Workload in METS:  7.0 Borg Scale: 16  Reason ETT Terminated:  desired heart rate attained    ST Segment Analysis At Rest: normal ST segments - no evidence of significant ST depression With Exercise: no evidence of significant ST depression  Other Information Arrhythmia:  Yes Angina during ETT:  absent (0) Quality of ETT:  diagnostic  ETT Interpretation:  normal - no evidence of ischemia by ST analysis  Comments: The patient had an excellent exercise tolerance.  There was no chest pain.  There was an appropriate level of dyspnea.  There were no arrhythmias, a normal heart rate response.  BP response was elevated.  There were no ischemic ST T wave changes and a normal heart rate recovery.  Recommendations Negative adequate ETT.  No further testing is indicated.  Based on the above I gave the patient a prescription for exercise.  I did add amlodipne 2.5 mg daily for better blood pressure control.

## 2010-12-31 ENCOUNTER — Telehealth: Payer: Self-pay | Admitting: Cardiology

## 2010-12-31 ENCOUNTER — Encounter: Payer: Self-pay | Admitting: Pulmonary Disease

## 2010-12-31 NOTE — Telephone Encounter (Signed)
RN s/w Pt: reports not being able to urinate with normal flow since beginning Amlodipine. He has had this problem occur once before when his b/p medicine was changed. Pt reports home BP readings have been in the 129-150's /71-77's range. RN advised Pt should stop Amlodipine and Hochrein/Pam will call back re medication change.  Pt has an appt to see Kriste Basque and will inform him re: problems with his prostate. Pt verbalizes understanding and will wait for their return call possible today or tomorrow.

## 2010-12-31 NOTE — Telephone Encounter (Signed)
PT HAVING PROBLEMS WITH HIS MEDICATIONS. FEELING AS IF HE NEEDS TO URINATE AND CAN'T. DYOVAN 320MG  AMALOPIDINE 2.5MG 

## 2011-01-01 NOTE — Telephone Encounter (Signed)
Pt aware to stop medication and see his primary care MD in follow for urinary problems.  Pt states he has an appt tomorrow with his primary care

## 2011-01-01 NOTE — Telephone Encounter (Signed)
The patient should stop the amlodipine.  He needs to talk to his primary care about his urination problem.

## 2011-01-01 NOTE — Telephone Encounter (Signed)
Pt aware and is following with his primary care MD tomorrow

## 2011-01-02 ENCOUNTER — Ambulatory Visit (INDEPENDENT_AMBULATORY_CARE_PROVIDER_SITE_OTHER): Payer: Medicare Other | Admitting: Pulmonary Disease

## 2011-01-02 ENCOUNTER — Other Ambulatory Visit (INDEPENDENT_AMBULATORY_CARE_PROVIDER_SITE_OTHER): Payer: Medicare Other

## 2011-01-02 ENCOUNTER — Encounter: Payer: Self-pay | Admitting: Pulmonary Disease

## 2011-01-02 DIAGNOSIS — K219 Gastro-esophageal reflux disease without esophagitis: Secondary | ICD-10-CM

## 2011-01-02 DIAGNOSIS — J449 Chronic obstructive pulmonary disease, unspecified: Secondary | ICD-10-CM

## 2011-01-02 DIAGNOSIS — D649 Anemia, unspecified: Secondary | ICD-10-CM

## 2011-01-02 DIAGNOSIS — J4489 Other specified chronic obstructive pulmonary disease: Secondary | ICD-10-CM

## 2011-01-02 DIAGNOSIS — E119 Type 2 diabetes mellitus without complications: Secondary | ICD-10-CM

## 2011-01-02 DIAGNOSIS — D126 Benign neoplasm of colon, unspecified: Secondary | ICD-10-CM

## 2011-01-02 DIAGNOSIS — E782 Mixed hyperlipidemia: Secondary | ICD-10-CM

## 2011-01-02 DIAGNOSIS — E663 Overweight: Secondary | ICD-10-CM

## 2011-01-02 DIAGNOSIS — I10 Essential (primary) hypertension: Secondary | ICD-10-CM

## 2011-01-02 DIAGNOSIS — I2510 Atherosclerotic heart disease of native coronary artery without angina pectoris: Secondary | ICD-10-CM

## 2011-01-02 DIAGNOSIS — N139 Obstructive and reflux uropathy, unspecified: Secondary | ICD-10-CM

## 2011-01-02 DIAGNOSIS — F411 Generalized anxiety disorder: Secondary | ICD-10-CM

## 2011-01-02 LAB — CBC WITH DIFFERENTIAL/PLATELET
Basophils Absolute: 0.1 10*3/uL (ref 0.0–0.1)
Eosinophils Absolute: 0 10*3/uL (ref 0.0–0.7)
Lymphocytes Relative: 22.6 % (ref 12.0–46.0)
MCHC: 34.2 g/dL (ref 30.0–36.0)
Neutrophils Relative %: 69.1 % (ref 43.0–77.0)
RBC: 4.59 Mil/uL (ref 4.22–5.81)
RDW: 14.3 % (ref 11.5–14.6)

## 2011-01-02 LAB — HEMOGLOBIN A1C: Hgb A1c MFr Bld: 7.3 % — ABNORMAL HIGH (ref 4.6–6.5)

## 2011-01-02 LAB — LIPID PANEL
Total CHOL/HDL Ratio: 4
Triglycerides: 69 mg/dL (ref 0.0–149.0)

## 2011-01-02 LAB — BASIC METABOLIC PANEL
CO2: 28 mEq/L (ref 19–32)
Chloride: 107 mEq/L (ref 96–112)
Creatinine, Ser: 0.8 mg/dL (ref 0.4–1.5)
Glucose, Bld: 119 mg/dL — ABNORMAL HIGH (ref 70–99)

## 2011-01-02 LAB — HEPATIC FUNCTION PANEL
ALT: 16 U/L (ref 0–53)
Albumin: 4 g/dL (ref 3.5–5.2)
Alkaline Phosphatase: 87 U/L (ref 39–117)
Bilirubin, Direct: 0.1 mg/dL (ref 0.0–0.3)
Total Protein: 7.1 g/dL (ref 6.0–8.3)

## 2011-01-02 LAB — PSA: PSA: 4.94 ng/mL — ABNORMAL HIGH (ref 0.10–4.00)

## 2011-01-02 MED ORDER — PANTOPRAZOLE SODIUM 40 MG PO TBEC: 40.0000 mg | DELAYED_RELEASE_TABLET | Freq: Every day | ORAL | Status: DC

## 2011-01-02 MED ORDER — NIACIN ER (ANTIHYPERLIPIDEMIC) 500 MG PO TBCR: 500.0000 mg | EXTENDED_RELEASE_TABLET | Freq: Every day | ORAL | Status: DC

## 2011-01-02 MED ORDER — METFORMIN HCL 500 MG PO TABS: 500.0000 mg | ORAL_TABLET | Freq: Two times a day (BID) | ORAL | Status: DC

## 2011-01-02 NOTE — Progress Notes (Signed)
Subjective:    Patient ID: Jesse Santos, male    DOB: [birth year], 75 y.o.   MRN: [id number]  HPI 75 y/o WM w/ mult med problems here for a follow up visit>   ~  January 09, 2010:  he's had alot going on w/ left knee surg 11/10 DrDuda to "clean it out", then series of 3 "liq cartilage" shots 2/11> starting to feel sl better & incr exercise... has infection in toe on Keflex... had squamous cell ca removed left hand... prev sinusitis resolved w/ Augmentin & Dosepak... he saw DrHochrein for f/u 2/11> stable, no angina, BP OK, no change in Rx...  ~  July 10, 2010:  mult somatic complaints- fell 10/1 w/ left knee injury & cortisone shot helped, c/o head congestion, CWP, sugars sl up, HA, etc... on a pos note- he stopped Vit E & urine flow is much better he says...  breathing stable- no ch in DOE;  BP controlled w/ diet, no salt;  Lipids OK on Niaspan;  DM control adeq on Metform;  refills written today.  ~  January 02, 2011:  78mo ROV & he notes lots of stress from dental problems etc; notes his BP has been up & he saw DrHochrein w/ Norvasc prescribed but he states this "stopped my urine flow- same thing happened to a guy at church" & he will let us know what that gentleman is taking for his BP; now on Diovan 320mg /d & BP is improved> EKG was normal, he has a Myoview sched soon for screening eval...  Lipids remain adeq controlled on his Niacin;  DM control fair on his Metformin monotherapy (A1c=7.3 & may need additional medication) but he hasn't yet lost any wt & we reviewe3d diet & exercise recommendations;  He is c/o some dysphagia w/ "food stopping on occas"> takes Protonix, Miralax, Levsin- and we discussed f/u eval from DrPerry to check his esoph;  LABS today showed elev PSA 1.0==>4.9 over the last yr (we called in Doxy x14d w/ repeat PSA 29mo & refer to Urology if not back to norm)...        Problem List:  HEARING LOSS (ICD-389.9) - he has bilat hearing aides...  ALLERGIC RHINITIS (ICD-477.9) - on  allergy shots from DrESL Laney Pastor)... plus Claritin, Saline, Mucinex, Flonase.  OBSTRUCTIVE SLEEP APNEA (ICD-327.23) - sleep study 8/04 showed RDI= 45 & desat to 88%... mod snoring and leg jerks without sleep disruption... CPAP perscribed but not using it now- states he can't sleep w/ it on & furthermore he is resting well as long as he stays on his side... denies daytime hypersomnolence & not interested in re-evaluation.  COPD (ICD-496) - ex-smoker, quit 1981... on ADVAIR 100Bid, PROAIR Prn, MUCINEX Bid... he had Pneumovax in 2008... doesn't like Pred Rx...  ~  10/11:  doing well except recent cough, min green sputum (no change in dyspnea)- we will Rx w/ Augmentin, incr Mucinex/ Fluids. ~  4/12:  Breathing at baseline, continue maintenance meds...  HYPERTENSION (ICD-401.9) - prev on diet alone> now on DIOVAN 320mg /d... ~  10/11:  BP= 128/80 & even better at home he says... denies HA, fatigue, visual changes, CP, palipit, dizziness, syncope, dyspnea, edema, etc... ~  4/12:  Pt has seen TP, DrHochrein, & checking BP daily at home; tried on Norvasc but claims it decr his urine flow; now on Diovan & improved w/ BP= 132/70...  R/O CAD (ICD-414.00) - on ASA 81mg /d... Followed by DrHochrein & his notes are reviewed. ~  hx  [facility]... ~  NuclearStressTest 10/02 was norm- no scar or ischemia, EF=55%. ~  CT Abd 4/10 via ER showed coronary calcif... referred to Cards. ~  Eval by DrHochrein 6/10 showed norm EKG & cardiac exam... treadmill ordered. ~  Treadmill 6/10 showed reasonable exerc tolerance, no ischemic changes, few PVCs, sl incr BP... ~  Follow up Myoview scheduled 4/12 ==> pending  MIXED HYPERLIPIDEMIA (ICD-272.2) - low HDL and NIASPAN 500mg /d started 4/09... ~  FLP 4/09 showed TChol 160, TG 106, HDL 26, LDL 113... rec- diet + Niaspan 500/d. ~  FLP 10/09 showed TChol 155, TG 78, HDL 33, LDL 106... ~  FLP 4/10 showed TChol 133, TG 63, HDL 30, LDL 91... rec> continue  same. ~  FLP 4/11 showed TChol 144, TG 61, HDL 38, LDL 94 ~  FLP 10/11 showed TChol 155, TG 81, HDL 40, LDL 99 ~  FLP 4/12 showed TChol 142, TG 69, HDL 38, LDL 90  DM (ICD-250.00) - on diet + METFORMIN 500mg Bid... he reports BS at home all in the 120-130's... ~  labs 4/09 showed BS= 132, HgA1c= 6.6.Marland KitchenMarland Kitchen rec- same med, better diet... ~  labs 10/09 showed BS= 131, HgA1c= 6.3.Marland KitchenMarland Kitchen ~  labs 4/10 showed BS= 117, HgA1c= 6.2.[name]. rec> same meds/ diet Rx. ~  labs 10/10 showed BS= 138, A1c= 6.5 ~  labs 4/11 showed BS= 121, A1c= 6.5.Marland KitchenMarland Kitchen On Metform500Bid + diet. ~  Dilated eye exam 5/11 by DrShapiro- no retinopathy... ~  labs 10/11 (wt=228#) showed BS= 118, A1c= 7.1.[name]. not as good- get on diet or more meds. ~  Labs 4/12 (wt=229#) showed BS= 119, A1c= 7.3... Wrong direction, may need more meds, get wt down!  GERD (ICD-530.81) & ? GASTROPARESIS - on PROTONIX 40mg /d, & off Reglan per DrPerry... ~   last EGD 6/07 by DrPerry showed gastic polyp... ~  8/10:  given Compazine suppos for gastroparesis flair by DrGessner. ~  4/12:  C/o intermittent dysphagia & referred back to GI for further eval (colonoscopy due as well)...  DIVERTICULOSIS OF COLON (ICD-562.10) - he uses MIRALAX Bid regularly... IRRITABLE BOWEL SYNDROME (ICD-564.1) COLONIC POLYPS (ICD-211.3) ~  last colonoscopy 5/07 by DrSam showed divertics only... f/u 33yrs. ~  CT Abd 4/10 in ER showed atx bases, coronary calcif, s/p GB, abdAo calcif, 11mm renal stone, divertics, bilat hip AVN.  RENAL CALCULUS, HX OF (ICD-V13.01) BENIGN PROSTATIC HYPERTROPHY, HX OF (ICD-V13.8) - on FLOMAX 0.4mg /d w/ improved symptoms (he states flow better since stopping Vit E supplement). ~  4/12:  Routine PSA= 4.9 (it was 1.02 4/11) & we Rx w/ Doxy Bid x14d, thewn plan recheck PSA==> pending  DEGENERATIVE JOINT DISEASE (ICD-715.90) - pt states "I have a bum knee" and eval by DrDuda w/ shots in his left shoulder too... note Abd CT w/ bilat hip AVN noted... takes TYLENOL  Prn... ~  11/10:  s/p left knee arthroscopy by DrDuda... ~  10/11:  s/p fall w/ left knee injury- s/p cortisone shot by DrDuda.  BACK PAIN, LUMBAR (ICD-724.2)  ANXIETY (ICD-300.00) - not currently on meds for nerves.  ANEMIA (ICD-285.9) - prev hx of GIB... ~  labs 4/09 showed Hg= 14.1 ~  labs 4/10 showed Hg= 13.6 ~  labs 4/11 showed Hg= 13.4 ~  Labs 4/12 showed Hg= 13.8  Health Maintenance: ~  GI: followed by DrPerry> Colonoscopy 5/07 by DrSam... f/u planned 5 yrs. ~  GU:  See PSAs recorded above... ~  Immunizations: Pneumovax in 2008 @ age  70;  Tetanus- given 4/10;  he gets yearly seasonal Flu vaccine.   Past Surgical History  Procedure Date  . Cholecystectomy   . Cataract extraction   . Skin cancer excision 3/10    left hand   . Knee surgery 11/10    Outpatient Encounter Prescriptions as of 01/02/2011  Medication Sig Dispense Refill  . aspirin 81 MG tablet Take 81 mg by mouth daily.        . Cholecalciferol (VITAMIN D) 2000 UNITS CAPS Take 1 capsule by mouth daily.        . fluticasone (FLONASE) 50 MCG/ACT nasal spray 2 sprays by Nasal route daily. As needed       . fluticasone-salmeterol (ADVAIR HFA) 115-21 MCG/ACT inhaler Inhale 2 puffs into the lungs 2 (two) times daily. One puff twice a day       . Guaifenesin (MUCINEX MAXIMUM STRENGTH) 1200 MG TB12 Take by mouth.        . hyoscyamine (LEVSIN SL) 0.125 MG SL tablet Place 0.125 mg under the tongue every 4 (four) hours as needed. As needed for abdominal cramping       . loratadine (CLARITIN REDITABS) 10 MG dissolvable tablet Take 10 mg by mouth daily.        . metFORMIN (GLUCOPHAGE) 500 MG tablet Take 500 mg by mouth 2 (two) times daily with a meal.        . Multiple Vitamins-Minerals (MULTIVITAMIN WITH MINERALS) tablet Take 1 tablet by mouth daily.        . niacin (NIASPAN) 500 MG CR tablet Take 500 mg by mouth at bedtime.        . pantoprazole (PROTONIX) 40 MG tablet Take 40 mg by mouth daily.        . polyethylene  glycol (MIRALAX) powder Take 17 g by mouth 2 (two) times daily. Take as directed      . sildenafil (VIAGRA) 50 MG tablet Take 1 tablet (50 mg total) by mouth daily as needed for erectile dysfunction.  10 tablet  2  . sodium chloride (OCEAN) 0.65 % nasal spray 1 spray by Nasal route as needed.        . Tamsulosin HCl (FLOMAX) 0.4 MG CAPS Take 1 tablet by mouth daily.      [name] triamcinolone (KENALOG) 0.1 % cream Apply topically 2 (two) times daily. As directed       . valsartan (DIOVAN) 320 MG tablet Take 1 tablet (320 mg total) by mouth daily.  30 tablet  11  . amLODipine (NORVASC) 2.5 MG tablet Take 1 tablet (2.5 mg total) by mouth daily.  30 tablet  11    Allergies  Allergen Reactions  . Prednisone     REACTION: high dose intolerance    Review of Systems         See HPI - all other systems neg except as noted... The patient complains of decreased hearing, dyspnea on exertion, headaches, and difficulty walking.  The patient denies anorexia, fever, weight loss, weight gain, vision loss, hoarseness, chest pain, syncope, peripheral edema, prolonged cough, hemoptysis, abdominal pain, melena, hematochezia, severe indigestion/heartburn, hematuria, incontinence, muscle weakness, suspicious skin lesions, transient blindness, depression, unusual weight change, abnormal bleeding, enlarged lymph nodes, and angioedema.     Objective:   Physical Exam      WD, WN, 75 y/o WM in NAD... GENERAL:  Alert & oriented; pleasant & cooperative... HEENT:  [place]/AT, EOM-full, PERRLA, EACs-clear, TMs-wnl, NOSE- pale, congested, THROAT-clear & wnl. NECK:  Supple  w/ fairROM; no JVD; normal carotid impulses w/o bruits; no thyromegaly or nodules palpated; no lymphadenopathy. CHEST:  Clear to P & A; without wheezes/ rales/ or rhonchi heard... HEART:  Regular Rhythm; without murmurs/ rubs/ or gallops detected...  ABDOMEN:  Soft & nontender; normal bowel sounds; no organomegaly or masses palpated., no guarding, or  rebound EXT: without deformities, mild arthritic changes; no varicose veins/ venous insuffic/ or edema. NEURO:  CN's intact;  no focal neuro deficits... DERM:  No lesions noted; no rash etc...   Assessment & Plan:   COPD>  He continues on Advair, Proair rescue, Mucinex; also Claritin, Flonase, Saline; stable continue same Rx...  HBP>  Seems better controlled on Diovan 320mg /d now but he is very interested in telling me what his church friend takes for his HBP & seems to work so well- he will call w/ this info...  R/O CAD>  Myoview pending from DrHochrein, continue ASA...  Lipids>  Look satois on his diet + Niacin...  DM>  Control is worse w/ A1c up to 7.3; he was told there are more meds in his future if he doesn't get his wt down (currently 229#)...  GI>  Per DrPerry w/ GERD, ?Gastroparesis, Divertics, IBS, Colon polyps> due for f/u colonoscopy & c/o dysphagia, therefore refer to DrPerry for EGD & Colon...  Anxiety>  Certainly an issue, he does not want anxiolytic Rx.Marland KitchenMarland Kitchen

## 2011-01-02 NOTE — Patient Instructions (Signed)
Today we updated your meds in our EPIC system...    We refilled the meds you requested...    We decided to try to control your BP w/ the DIOVAN 320mg /d, plus a low sodium diet, & by decreasing your stress level!!!  Today we did your follow up fasting blood work...    Please call the PHONE TREE in a few days for your results...    Dial [id number] & when prompted enter your patient number followed by the # symbol...    Your patient number is:  [id number]  Call for any questions...    We decided to keep close tabs on your BP issue & would like to recheck you in one month.Marland KitchenMarland Kitchen

## 2011-01-12 ENCOUNTER — Telehealth: Payer: Self-pay | Admitting: *Deleted

## 2011-01-12 DIAGNOSIS — Z87898 Personal history of other specified conditions: Secondary | ICD-10-CM

## 2011-01-12 MED ORDER — DOXYCYCLINE HYCLATE 100 MG PO CAPS: 100.0000 mg | ORAL_CAPSULE | Freq: Two times a day (BID) | ORAL | Status: AC

## 2011-01-12 NOTE — Telephone Encounter (Signed)
Called and spoke with pt about his lab results.  He is aware to take the doxy x 14 days and he will return to the lab on 5-2 prior to ov for repeat of psa.  Pt is aware to cont same meds for now and work on a better diet for his sugar and a1c.  Pt will call for other concerns.

## 2011-01-14 ENCOUNTER — Encounter: Payer: Self-pay | Admitting: Pulmonary Disease

## 2011-01-26 ENCOUNTER — Ambulatory Visit: Payer: Medicare Other | Admitting: Internal Medicine

## 2011-01-27 NOTE — Discharge Summary (Signed)
Jesse Santos, Jesse Santos              ACCOUNT NO.:  [id number]   MEDICAL RECORD NO.:  [id number]          PATIENT TYPE:  INP   LOCATION:  5501                         FACILITY:  [facility]   PHYSICIAN:  Leslye Peer, MD    DATE OF BIRTH:  04-09-1936   DATE OF ADMISSION:  03/27/2009  DATE OF DISCHARGE:  03/27/2009                               DISCHARGE SUMMARY   DISCHARGE DIAGNOSIS:  Gastroparesis flare.   CONSULTANTS:  Dr. Iva Boop, MD, [facility] with [facility].   LABORATORY DATA:  BNP less than 30.  Cardiac enzymes negative.  CBC  demonstrates a hemoglobin of 12.8, hematocrit 37.8, white blood cell  count of 11.1, and platelet count 184.  Sodium 140, potassium 4.3,  chloride 104, CO2 of 23, BUN 19, creatinine 0.79, lipase 40, and glucose  137.  EKG demonstrated normal sinus rhythm.   BRIEF HISTORY:  Jesse Santos is a pleasant 75 year old male patient who  presented to [facility] Emergency Room approximately 4:00 a.m. in the  morning on March 27, 2009, with chief complaint of epigastric pain  following episode of nausea and vomiting.  He had had a prior history of  coronary artery calcification identified by CAT scan, however, recently  he had undergone extensive cardiac evaluation by Dr. Rollene Rotunda  which included a cardiac stress test which was negative for ST changes  or evidence of ischemia.  He therefore was cleared from a cardiac  standpoint.  However, given his history, the patient was worried about  potential chest discomfort and Pulmonary Medicine Team was asked to  admit.   HOSPITAL COURSE BY DISCHARGE DIAGNOSE S:  1. Gastroparesis flare.  Jesse Santos was seen and evaluated and      extensive cardiac evaluation was done.  This included 12-lead EKG,      as well as a full set of cardiac enzymes.  These were all negative      and nondiagnostic.  Because of his extensive GI history, [facility] was called in.  He was then seen by Dr. Stan Head.  It was his impression that the patient was having a flare      of gastroparesis and recommendations were made for one Compazine      suppositories on a p.r.n. basis and to follow up with Dr. Marina Goodell,      this appointment was made.  The patient is now medically stable and      cleared for discharge.   DISCHARGE INSTRUCTIONS:  Diet as tolerated.   DISCHARGE MEDICATIONS:  1. Vitamin D3 of 1000 international units daily.  2. Niaspan 500 mg at bedtime.  3. Flomax 0.4 mg at bedtime.  4. Metoclopramide 10 mg 4 times a day.  5. Protonix 40 mg daily.  6. Metformin 500 mg twice a day.  7. Advair 100/50 one puff twice a day.  8. Mucinex 2 tablets daily.  9. Aspirin 81 mg daily.  10.Claritin 1 tablet daily.  11.Allergy shot every 2 weeks.  This is subcutaneous.  12.Polyethylene glycol 17 g  twice a day.  Finally, a new prescription consists of Compazine 25 mg suppository,  instructed to take per rectum every 8-12 hours as needed for episodes of  nausea and vomiting.   DISPOSITION:  Jesse Santos has met maximum benefit from inpatient stay and  has ruled out from a cardiac standpoint in evaluation of chest  discomfort.  He will follow up with Dr. Yancey Flemings on April 15, 2009 and  Dr. Alroy Dust on April 17, 2009.   Over 30 minutes of time was dedicated to discharge instruction and  planning.      Zenia Resides, NP      Leslye Peer, MD  Electronically Signed    PB/MEDQ  D:  03/27/2009  T:  03/28/2009  Job:  [id number]

## 2011-01-27 NOTE — H&P (Signed)
Jesse Santos, Jesse Santos              ACCOUNT NO.:  [id number]   MEDICAL RECORD NO.:  [id number]          PATIENT TYPE:  EMS   LOCATION:  ED                           FACILITY:  [facility]   PHYSICIAN:  Lonzo Cloud. Kriste Basque, MD     DATE OF BIRTH:  [birth year]   DATE OF ADMISSION:  10/17/2007  DATE OF DISCHARGE:                              HISTORY & PHYSICAL   CHIEF COMPLAINT:  Nausea and vomiting and fever.   HISTORY OF PRESENT ILLNESS:  Jesse Santos is a 75 year old white male  patient of Dr. Kriste Basque and Dr. Marina Goodell.  He presents to the emergency  department today, October 17, 2007 with complaints of nausea and  vomiting x5 of undigested hot dogs and Cheetos.  He had the hot dogs and  Cheetos __________ during [employer] Bowl game last p.m. around 8 or 9 p.m.  He became nauseous and vomited five times in the early a.m. at  approximately 0300.  He has noted fever without chills or sweats.  Of  note, he had a similar episode of this back in November 2008, again  triggered by food consumption.  He was treated by the emergency  department physician with Phenergan tablets.  Of note, he did try  Phenergan tablets during the night but vomited them up.  He has a GI  history of gastric polyp removal with diverticula followed by Dr. Marina Goodell.  Since this is the second episode, he will be admitted for his GI work-  up.   PAST MEDICAL HISTORY:  1. Significant for hypertension.  2. Diabetes mellitus.  3. Chronic obstructive pulmonary disease.  4. Gastroesophageal reflux disease.  5. Adenomatous colon polyps.  6. Irritable bowel syndrome.  7. Diverticulosis.  8. Obstructive sleep apnea.  9. History of kidney stones.  10.Large inflammatory gastric polyp, status post endoscopic resection.  11.Iron-deficiency anemia secondary to chronic blood loss from gastric      polyp.  12.Probable AVM on colonoscopy.  13.He does have a history of renal calculus.  14.History of asthmatic bronchitis.  15.Anxiety.  16.Back  pain.   PAST SURGICAL HISTORY:  Cholecystectomy.   ALLERGIES:  He is intolerant to high dose prednisone, but this is not a  true allergy.   MEDICATIONS:  1. Protonix 40 mg daily.  2. Carafate 1 g t.i.d.  3. MiraLax 17 g b.i.d.  4. Reglan 10 mg q.i.d.  5. Advair 250/50 one puff b.i.d.  6. Albuterol p.r.n. nebulizers.  7. Mucinex 600 mg p.r.n.  8. Aspirin 81 mg.  9. Multivitamin.  10.Vitamin E.  11.Weekly allergy shots.  12.Metformin ER 800 mg daily.  [name]   PHYSICAL EXAMINATION:  GENERAL:  Well-nourished, well-developed white  male in no acute distress.  Is on oxygen at this time.  HEENT:  Without JVD.  VITAL SIGNS: Blood pressure initially 162/76; now 104/75.  Heart rate  114, temperature 100.1.  Respiratory rate 22.  Oxygen saturation 97% on  2 L nasal cannula.  CHEST:  Clear to auscultation.  CARDIAC:  Heart sounds regular.  Regular rate and rhythm.  ABDOMEN:  Soft, nontender.  Positive bowel sounds.  GENITOURINARY:  He voids.  EXTREMITIES:  Warm.   BRIEF LABORATORY DATA:  WBC 14.8, glucose 196, creatinine 0.82.  CT scan  of the abdomen was negative except for scattered diverticula.   IMPRESSION:  1. Recurrent nausea and vomiting with history of gastric polyps,      diverticulosis, and irritable bowel syndrome., and poor choice of      food consumption.  CT scan of the abdomen was negative.  Therefore,      we will admit, hydrate and order an ultrasound of the abdomen to      further rule out any GI source of nausea and vomiting and request      GI consult.  2. Diabetes mellitus.  Will put his p.o. medicines on hold at this      time and check sliding scale aspirin q.6h.  3. Hypertension.  Will hold his medications for  now.  4. Asthma.  He will have p.r.n. nebulizer.  Will hold his Advair.  5. Fever.  Will check culture data.  No embolics at this time.      Devra Dopp, MSN, ACNP      Scott M. Kriste Basque, MD  Electronically Signed   SM/MEDQ  D:  10/17/2007  T:   10/17/2007  Job:  [id number]   cc:   [name]. Marina Goodell, MD  [address]  [place]  [place] [address]

## 2011-01-27 NOTE — Assessment & Plan Note (Signed)
[facility]                         GASTROENTEROLOGY OFFICE NOTE   NAME:Jesse Santos, Jesse Santos                     MRN:          [id number]  DATE:01/19/2008                            DOB:          05/18/1936    The patient is self-referred.   REASON FOR CONSULTATION:  Nausea.   HISTORY:  This is a 75 year old white male with a history of  hypertension, diabetes mellitus, COPD, gastroesophageal reflux disease,  and anxiety.  He presents today regarding problems with nausea.  The  patient reports intermittent problems with nausea for some time.  Earlier this year, he developed problems with nausea, vomiting, and  diarrhea which led to hospitalization.  He was felt to have an acute  gastroenteritis.  Since that time, he describes having nausea  approximately one time per week.  No vomiting.  This is relieved with 25  mg of Phenergan.  However, Phenergan results in some tiredness.  For his  reflux disease, he has been on Protonix 40 mg daily.  Apparently also on  metoclopramide 10 mg q.i.d.  He has had no odynophagia, dysphagia, or  weight loss.  No GI bleeding.  His history is remarkable for large  gastric polyp which was removed endoscopically.  This was inflammatory  and felt to be responsible for iron deficiency anemia.  He is also known  to have adenomatous colon polyps, diverticulosis, and isolated vascular  malformation of the colon.  Also hemorrhoids.  His last upper endoscopy  was in June of 2007.  His last colonoscopy in May of 2007.  He is due  for surveillance colonoscopy in May of 2012.  The patient cannot  identify any factors that seem to exacerbate his nausea.  Meals have no  effect.  He is status post cholecystectomy.  No nocturnal symptoms.  He  does take a number of other medications, though he does not feel that he  has upset his stomach.   PAST MEDICAL HISTORY:  COPD, hypertension, type 2 diabetes,  gastroesophageal reflux disease,  irritable bowel syndrome, anxiety,  diverticulosis, kidney stones.   PAST SURGICAL HISTORY:  Cholecystectomy.   ALLERGIES:  He is intolerant to PREDNISONE.   CURRENT MEDICATIONS:  1. Protonix 40 mg daily.  2. MiraLax 17 grams b.i.d. for constipation.  3. Metoclopramide 10 mg q.i.d.  4. Advair.  5. Mucinex.  6. Baby aspirin.  7. Multivitamin.  8. Vitamin E.  9. Allergy shot.  10.Proair inhaler.  11.Metformin 500 mg b.i.d.  12.Flomax.  13.Niaspan 500 mg daily.   FAMILY HISTORY:  Noncontributory.   SOCIAL HISTORY:  Married.  Retired.   PHYSICAL EXAMINATION:  GENERAL:  Elderly male in no acute distress.  He  is alert and oriented.  VITAL SIGNS:  Blood pressure is 164/74, heart rate is 70, respirations  are 16 and unlabored.  Weight is 233 pounds.  HEENT:  Sclerae anicteric.  Conjunctivae pink.  Oral mucosa is intact.  There is no thrush.  There is no adenopathy.  Thyroid is normal.  LUNGS:  Clear to auscultation and percussion.  HEART:  Regular without murmur.  ABDOMEN:  Obese and  soft without tenderness, mass, or hernia.  Good  bowel sounds heard.  Prior cholecystectomy scar is well-healed.  There  is no succussion splash.  RECTAL:  Omitted.  EXTREMITIES:  Without cyanosis, clubbing, or edema.  NEUROLOGY:  He is alert and oriented x4 without focal deficit.   IMPRESSION:  1. Nausea.  Nonspecific.  May be due to reflux, could be medications,      could be gastroparesis, could be functional.  2. History of reflux disease.  No classic symptoms on Protonix.  3. History of gastric polyp previously resected.  4. History of adenomatous colon polyps.  Surveillance up-to-date.   RECOMMENDATIONS:  1. Gastric emptying scan to evaluate for significant gastroparesis.  2. Upper endoscopy to further evaluate symptoms and exclude ulcer or      Candida esophagitis as a cause for nausea.  3. Adjust diabetic medications preprocedure to avoid hypoglycemia.  4. Continue general medical  care with Dr. Kriste Basque.     [name]. Marina Goodell, MD  Electronically Signed    JNP/MedQ  DD: 01/20/2008  DT: 01/20/2008  Job #: [id number]   cc:   Lonzo Cloud. Kriste Basque, MD

## 2011-01-27 NOTE — Discharge Summary (Signed)
Jesse Santos, Jesse Santos              ACCOUNT NO.:  [id number]   MEDICAL RECORD NO.:  [id number]          PATIENT TYPE:  INP   LOCATION:  1539                         [facility]AC[facility]   PHYSICIAN:  Lonzo Cloud. Kriste Basque, MD     DATE OF BIRTH:  09-24-1935   DATE OF ADMISSION:  10/17/2007  DATE OF DISCHARGE:  10/20/2007                               DISCHARGE SUMMARY   FINAL DIAGNOSES:  1. Admitted October 17, 2007 via the emergency room with nausea,      vomiting, and diarrhea.  Evaluation reveals probable      gastroenteritis, and patient improved rapidly with antiemetics and      empiric Flagyl.  2. History of chronic obstructive pulmonary disease and asthmatic      bronchitis - controlled with Advair, albuterol, and Mucinex.  3. History of obstructive sleep apnea.  4. History of hypertension - controlled on low sodium diet alone.  5. History of diabetes mellitus controlled on metformin.  6. History of hyperlipidemia also controlled on diet alone.  7. History of gastroesophageal reflux disease with gastric polyp.  8. History of diverticulosis, irritable bowel syndrome, and colon      polyps.  9. Status post cholecystectomy.  10.History of kidney stones.  11.History of degenerative arthritis and low back pain with      spondylosis.  12.History of anxiety.  13.History of anemia secondary to blood loss from the above mentioned      gastric polyp.   BRIEF HISTORY AND PHYSICAL:  The patient is a 75 year old white man  known to me with multiple medical problems as noted above.  He presented  to the emergency room on October 17, 2007 with an acute history  consisting of nausea and vomiting on several occasions after eating a  hot dog and Cheetos at a [employer].  He continued to vomit,  developed low grade fever, and had diarrhea.  He presented to the  emergency room where he was seen by the emergency room physician, given  IV fluids, and referred for admission.   PAST MEDICAL HISTORY:   As noted he has a history of COPD and asthmatic  bronchitis.  He is an ex-smoker having quit in 1981.  He takes Advair,  albuterol, and Mucinex.  He has a history of obstructive sleep apnea.  He has been on CPAP.  He has a history of hypertension which he controls  on a low sodium diet alone, and has not required medications.  He had a  negative heart catheterization in 1988 and a normal Cardiolite in 2002.  He has adult onset diabetes, and takes metformin b.i.d. His last blood  sugar was 140, hemoglobin A1c 6.7 in October 2008.  Has a history of  mild hyperlipidemia which he controls with diet alone.  His last  cholesterol was 165, triglycerides 90, HDL 31, LDL 116.  He has  gastroesophageal reflux disease, and had an endoscopy in June 2007 that  showed large gastric polyp which was removed.  He had slight anemia with  GI blood loss related to this polyp.  He has a  history of  diverticulosis, irritable bowel syndrome, and colon polyps.  He is  followed by Dr. Marina Goodell for GI.  His last colonoscopy in May 2007 showed  diverticulosis, hemorrhoids, and question of an AV malformation.  He is  treated with Protonix, Reglan, and MiraLax.  He has a history of kidney  stones in the past.  He has a history of degenerative arthritis and low  back pain with spondylosis and a history of moderate spinal stenosis.  He has a history of anxiety.   PHYSICAL EXAMINATION:  GENERAL:  Revealed a 75 year old gentleman in no  acute distress.  VITAL SIGNS:  Blood pressure 160/70, pulse 114 and regular, respirations  22 per minute not labored, O2 sat 97% on 2 liters, and temperature 100  degrees.  HEENT:  Unremarkable.  NECK:  Showed no jugular venous distention, no carotid bruits, no  thyromegaly or lymphadenopathy.  CHEST:  Was clear to percussion, auscultation.  CARDIAC:  Revealed regular rhythm, normal S1, S2 without murmurs, rubs  or gallops heard.  ABDOMEN:  Soft and nontender.  Bowel sounds were intact.   There were no  masses detected.  EXTREMITIES:  Warm.  There was no cyanosis, clubbing or edema.  NEUROLOGIC:  Intact.   LABORATORY DATA:  Chest x-ray revealed some left base atelectasis;  otherwise, negative.  CT scan of the abdomen showed the same left base  atelectasis versus scarring; previous cholecystectomy with vascular  clips; diffuse fatty infiltration of the pancreas without focal lesions;  parenchymal calcification in the left kidney; a small right renal cyst;  calcification within the aorta; diverticulosis in the pelvis without  inflammation; and a normal appendix.  He also had an abdominal  ultrasound that was negative status post cholecystectomy.   Hemoglobin 14.9, hematocrit 43.9, white count 14,800 with 93% segs.  Cardiac enzymes were negative.  Serum amylase and lipase were normal.  Sodium 138, potassium 4.4, chloride 104, CO2 24, BUN 16, creatinine 0.8.  Blood sugar 196, hemoglobin A1c 6.5.  Liver enzymes were normal.   HOSPITAL COURSE:  The patient was admitted with gastroenteritis with  diarrhea; rule out other etiologies.  He had been treated with Avelox in  the month prior to admission and concern for C difficile.  Stool was set  up for culture and returned negative.  He was placed on empiric Flagyl  orally and improved rapidly.  His nausea and vomiting resolved and his  diarrhea resolved.  He was given IV fluids and blood pressure improved,  and he had no postural changes.  The patient was slowly ambulated on the  floor and found to be MHB and ready for discharge on October 20, 2007.   MEDICATIONS AT DISCHARGE:  1. Advair 100/50 1 puff b.i.d.  2. Albuterol 2 sprays every 4-6 hours as needed.  3. Mucinex 1-2 tablets p.o. b.i.d. with plenty of fluids.  4. Metformin 500 mg p.o. b.i.d.  5. Protonix 40 mg p.o. q.a.m.  6. Zyrtec 10 mg p.o. daily as needed for allergies.  7. Flagyl 250 mg p.o. q.i.d. for five more days until gone.  8. Multivitamin daily.    CONDITION ON DISCHARGE:  Improved.   DISPOSITION:  The patient was discharged home.  Will call the office for  follow-up in 1-2 weeks.      Lonzo Cloud. Kriste Basque, MD  Electronically Signed     SMN/MEDQ  D:  10/20/2007  T:  10/21/2007  Job:  [id number]

## 2011-01-27 NOTE — Assessment & Plan Note (Signed)
[facility]                             PULMONARY OFFICE NOTE   NAME:Jesse Santos, Jesse Santos                     MRN:          [id number]  DATE:07/25/2007                            DOB:          1935/10/20    HISTORY OF PRESENT ILLNESS:  The patient is a 75 year old white male  patient of Dr. Kriste Basque, who has a known history of asthmatic bronchitis,  allergic rhinitis and diabetes mellitus that presents today for a 4-day  history of productive cough, nasal congestion, sore throat and some  malaise.  The patient has been using some Zyrtec and Mucinex without  much relief.  The patient denies any chest pain, shortness of breath,  orthopnea, PND or leg swelling.   PAST MEDICAL HISTORY:  Reviewed.   MEDICATIONS:  Reviewed.   PHYSICAL EXAM:  The patient is a pleasant male in no acute distress.  He is afebrile with stable vital signs.  O2 saturation is 96% on room  air.  HEENT:  Nasal mucosa with some mild erythema.  Nontender sinuses.  Posterior pharynx is clear.  NECK:  Supple without cervical adenopathy.  No JVD.  Lungs sounds are clear to auscultation without any wheezing or crackles.  CARDIAC:  Regular rate.  ABDOMEN:  Soft and nontender.  EXTREMITIES:  Warm without any edema.   IMPRESSION AND PLAN:  Acute upper respiratory infection.  The patient is  to begin Mucinex-DM twice daily.  Xopenex nebulizer treatment was given  today in the office.  The patient is advised to use a Z-Pak and to have  a Z-Pak on hold in case symptoms worsen with persistent purulent sputum.  The patient is to return back to the office if symptoms do not improve  or worsen.  The patient is to return back to the office as scheduled or  call for sooner follow-up if his symptoms do not improve or worsen.      Rubye Oaks, NP  Electronically Signed      Lonzo Cloud. Kriste Basque, MD  Electronically Signed   TP/MedQ  DD: 07/25/2007  DT: 07/26/2007  Job #: [id number]

## 2011-01-27 NOTE — Consult Note (Signed)
NAMERIGLEY, NIESS              ACCOUNT NO.:  [id number]   MEDICAL RECORD NO.:  [id number]          PATIENT TYPE:  INP   LOCATION:  5501                         FACILITY:  [facility]   PHYSICIAN:  Iva Boop, MD,FACGDATE OF BIRTH:  [birth year]   DATE OF CONSULTATION:  DATE OF DISCHARGE:  03/27/2009                                 CONSULTATION   REQUESTING PHYSICIAN:  Jesse Cloud. Kriste Basque, MD   REASON FOR CONSULTATION:  Nausea, vomiting, abdominal and chest pain.   ASSESSMENT:  A 75 year old white man with recurrent gastroparesis who  has had a flare of his gastroparesis with concomitant chest pain.  This  has completely resolved.  Cardiac enzymes are negative.   Note, the patient incorrectly believed that his gastric emptying was  normal because he had a normal retention on a gastric emptying study in  May 2009 with 8.6% retained at 2 hours, however, at 60 minutes, there  was 91.5% retention.  He has a long history of intermittent nausea and  vomiting.  He has been using metoclopramide and shows no signs of  tardive dyskinesia or neuropathy, though he is aware of these possible  problems.   RECOMMENDATIONS AND PLAN:  1. Discharge the patient to home today.  This was discussed with Dr.      Ortencia Kick who is covering for Dr. Kriste Santos.  2. Follow up urinalysis that was ordered though that is likely to be      low yield as a cause of his problems.  No urinary symptoms, and he      is completely better.  3. Compazine 25 mg suppository as needed every 12 hours for nausea and      vomiting, p.r.n. flare of this and try to go to clear liquids and      gradually advance as tolerated.  4. Follow a gastroparesis diet plan as well when he flares.  5. See Dr. Yancey Flemings in followup on August 2 at 2:30 p.m. for      coordination of further care and to discuss this further and      consider intermittent use of metoclopramide at that time, though      that could be tricky given the possible side effects  that may be      worthwhile.   For further details of this consultation, please see the hospital chart.      Iva Boop, MD,FACG  Electronically Signed     CEG/MEDQ  D:  03/27/2009  T:  03/28/2009  Job:  [id number]   cc:   [name]. Marina Goodell, MD  Jesse Cloud. Kriste Basque, MD

## 2011-01-30 NOTE — Assessment & Plan Note (Signed)
[facility]                         GASTROENTEROLOGY OFFICE NOTE   NAME:Jesse Santos, Jesse Santos                     MRN:          [id number]  DATE:01/18/2007                            DOB:          May 01, 1936    REASON FOR CONSULTATION:  Followup regarding gastric polyp.   HISTORY:  This is a pleasant 75 year old white male with a history of  hypertension, type 2 diabetes mellitus, chronic obstructive pulmonary  disease, obstructive sleep apnea, irritable bowel syndrome, and  gastroesophageal reflux disease who is referred through the courtesy of  Dr. Kriste Basque regarding prior history of large gastric polyp.  Approximately  one year ago the patient developed iron deficiency anemia.  He underwent  colonoscopy and upper endoscopy with Dr. Corinda Gubler.  The colonoscopy  revealed diverticulosis and hemorrhoids.  As well, photograph suggesting  vascular malformation.  Upper endoscopy revealed benign esophageal  stricture as well as a large antral polyp which was inflamed.  The polyp  was felt to be the source of chronic blood loss leading to iron  deficiency anemia.  He subsequently underwent resection of a 2.5 mm  polyp on February 15, 2006.  The polyp was benign and inflammatory.  Office  followup on iron supplementation demonstrated an improved hemoglobin.  Recent evaluation with Dr. Kriste Basque, January 11, 2007 revealed a normal  hemoglobin of 14.7.  For the patient's reflux disease he is on Protonix  40 mg daily as well as metoclopramide 10 mg q.i.d.  He has also been on  sucralfate for unclear reasons.  He takes MiraLax for his chronic  constipation.  The patient denies nausea, vomiting, abdominal pain,  melena, or hematochezia.   PAST MEDICAL HISTORY:  1. Hypertension.  2. Diabetes mellitus.  3. Chronic obstructive pulmonary disease.  4. Gastroesophageal reflux disease.  5. Adenomatous colon polyps.  6. Irritable bowel syndrome.  7. Diverticulosis.  8. Obstructive  sleep apnea.  9. History of kidney stones.  10.Large inflammatory gastric polyp status post endoscopic resection.  11.Iron deficiency anemia secondary to chronic blood loss from the      large gastric polyp.  12.Probable AVM on colonoscopy.   PAST SURGICAL HISTORY:  Cholecystectomy.   ALLERGIES:  INTOLERANT TO HIGH DOSE PREDNISONE, BUT NOT TRULY ALLERGIC.   CURRENT MEDICATIONS:  1. Protonix 40 mg daily.  2. Sucralfate 1 gram t.i.d.  3. MiraLax 17 grams b.i.d.  4. Metoclopramide 10 mg q.i.d.  5. Advair.  6. Albuterol.  7. Mucinex.  8. Aspirin 81 mg.  9. Multivitamin.  10.Vitamin E.  11.Allergy shot once weekly.  12.Metformin ER 800 mg daily.   PHYSICAL EXAMINATION:  Well-appearing male in no acute distress.  He is  alert and oriented.  His blood pressure is 162/90, heart rate is 84 and  regular, his weight 238.4 pounds.  HEENT:  Sclerae are anicteric, conjunctivae are pink, oral mucosa  intact, no adenopathy,  LUNGS:  Clear.  HEART:  Regular.  ABDOMEN:  Soft, nontender, nondistended with good bowel sounds.  No  organomegaly or mass, prior surgical incisions well healed.   IMPRESSION:  1. History of large hyperplastic gastric polyp, status  post endoscopic      resection.  2,  Iron deficiency anemia, secondary to chronic blood loss from large  gastric polyp.  Resolved.  1. History of gastroesophageal reflux disease.  Asymptomatic on      medical therapy.  2. Chronic constipation. Stable.  3. History of adenomatous colon polyps, due for followup in 2012.   RECOMMENDATIONS:  1. Continue Protonix.  2. Discontinue sucralfate.  3. Colonoscopy in 2012 as planned.  4. Ongoing periodic blood checks with Dr. Kriste Basque has has been done.  5. GI followup p.r.n.     [name]. Marina Goodell, MD  Electronically Signed    JNP/MedQ  DD: 01/18/2007  DT: 01/18/2007  Job #: [id number]   cc:   Lonzo Cloud. Kriste Basque, MD

## 2011-02-02 ENCOUNTER — Encounter: Payer: Self-pay | Admitting: Internal Medicine

## 2011-02-02 ENCOUNTER — Ambulatory Visit (INDEPENDENT_AMBULATORY_CARE_PROVIDER_SITE_OTHER): Payer: Medicare Other | Admitting: Internal Medicine

## 2011-02-02 ENCOUNTER — Other Ambulatory Visit (INDEPENDENT_AMBULATORY_CARE_PROVIDER_SITE_OTHER): Payer: Medicare Other

## 2011-02-02 VITALS — BP 126/64 | HR 96 | Ht 74.0 in | Wt 231.0 lb

## 2011-02-02 DIAGNOSIS — K219 Gastro-esophageal reflux disease without esophagitis: Secondary | ICD-10-CM

## 2011-02-02 DIAGNOSIS — Z1211 Encounter for screening for malignant neoplasm of colon: Secondary | ICD-10-CM

## 2011-02-02 DIAGNOSIS — Z87898 Personal history of other specified conditions: Secondary | ICD-10-CM

## 2011-02-02 DIAGNOSIS — R112 Nausea with vomiting, unspecified: Secondary | ICD-10-CM

## 2011-02-02 DIAGNOSIS — N400 Enlarged prostate without lower urinary tract symptoms: Secondary | ICD-10-CM

## 2011-02-02 DIAGNOSIS — Z860100 Personal history of colon polyps, unspecified: Secondary | ICD-10-CM

## 2011-02-02 DIAGNOSIS — E119 Type 2 diabetes mellitus without complications: Secondary | ICD-10-CM

## 2011-02-02 DIAGNOSIS — K5900 Constipation, unspecified: Secondary | ICD-10-CM

## 2011-02-02 DIAGNOSIS — R1310 Dysphagia, unspecified: Secondary | ICD-10-CM

## 2011-02-02 LAB — PSA: PSA: 1.3 ng/mL (ref 0.10–4.00)

## 2011-02-02 MED ORDER — PROCHLORPERAZINE 25 MG RE SUPP: 25.0000 mg | Freq: Two times a day (BID) | RECTAL | Status: DC | PRN

## 2011-02-02 MED ORDER — PEG-KCL-NACL-NASULF-NA ASC-C 100 G PO SOLR: 1.0000 | Freq: Once | ORAL | Status: AC

## 2011-02-02 NOTE — Patient Instructions (Signed)
Colonoscopy [facility] 02/12/11 10:00 am arrive at 9:00 am on 4th floor. Moviprep prescription sent to your pharmacy for you to pick up. Colonoscopy brochure given for you to review. Hold Diabetic medication morning of procedure.

## 2011-02-02 NOTE — Progress Notes (Signed)
HISTORY OF PRESENT ILLNESS:  Jesse Santos is a 75 y.o. male with multiple significant medical problems as listed below. He has been followed in this office for GERD, large gastric polyp removed endoscopically, and adenomatous colon polyps. He has also been evaluated for episodic nausea with vomiting, without definite cause found. He presents today regarding surveillance colonoscopy. Last colonoscopy in 2007 with Dr. Corinda Gubler revealed diverticulosis and hemorrhoids. No recurrent neoplasia. Followup in 5 years recommended. The patient denies lower GI complaints. However, he does require MiraLax for his chronic constipation. For GERD he takes pantoprazole. This works nicely. His last episode of nausea with vomiting occurred in February. Problem generally lasts a day or less. He has taken Compazine suppositories occasionally and has found this helpful. He requested a refill.  REVIEW OF SYSTEMS:  All non-GI ROS negative except for hearing problems and pain with urination.  Past Medical History  Diagnosis Date  . Hearing loss   . Allergic rhinitis, cause unspecified   . Obstructive sleep apnea (adult) (pediatric)   . COPD (chronic obstructive pulmonary disease)   . Unspecified essential hypertension   . Mixed hyperlipidemia   . Coronary artery disease     Coronary calcification with negative ETT 2010  . Type II or unspecified type diabetes mellitus without mention of complication, not stated as uncontrolled   . Esophageal reflux   . Diverticulosis of colon (without mention of hemorrhage)   . Irritable bowel syndrome   . Benign neoplasm of colon   . Renal calculus   . BPH (benign prostatic hypertrophy)   . Degenerative joint disease   . Lumbar back pain   . Anxiety   . Anemia     Past Surgical History  Procedure Date  . Cholecystectomy   . Cataract extraction   . Skin cancer excision 3/10    left hand   . Knee surgery 11/10    Social History Jesse Santos  reports that he quit  smoking about 31 years ago. His smoking use included Cigarettes. He has a 25 pack-year smoking history. He quit smokeless tobacco use about 31 years ago. He reports that he does not drink alcohol or use illicit drugs.  family history includes Diabetes in his mother; Heart disease (age of onset:93) in his father; Hypertension in his brother; and Stroke (age of onset:88) in his mother.  There is no history of Colon cancer.  Allergies  Allergen Reactions  . Prednisone     REACTION: high dose intolerance       PHYSICAL EXAMINATION: Vital signs: BP 126/64  Pulse 96  Ht 6\' 2"  (1.88 m)  Wt 231 lb (104.781 kg)  BMI 29.66 kg/m2  Constitutional: generally well-appearing, no acute distress Psychiatric: alert and oriented x3, cooperative Eyes: extraocular movements intact, anicteric, conjunctiva pink Mouth: oral pharynx moist, no lesions Neck: supple no lymphadenopathy Cardiovascular: heart regular rate and rhythm, no murmur Lungs: clear to auscultation bilaterally Abdomen: soft, obese, nontender, nondistended, no obvious ascites, no peritoneal signs, normal bowel sounds, no organomegaly Rectal: Deferred until colonoscopy Extremities: no lower extremity edema bilaterally Skin: no lesions on visible extremities Neuro: No focal deficits.   ASSESSMENT:  #1. GERD. Symptoms controlled with pantoprazole #2. Episodic nausea with vomiting. Cause uncertain #3. History of adenomatous colon polyps. Last colonoscopy in 2007 negative for neoplasia. Due for followup #4. Chronic constipation. Improved with MiraLax #5. Multiple medical problems including diabetes   PLAN:  #1. Continue pantoprazole and reflux precautions #2. Prescribed Compazine suppositories when necessary #3.  Continue MiraLax #4. Schedule surveillance colonoscopy.The nature of the procedure, as well as the risks, benefits, and alternatives were carefully and thoroughly reviewed with the patient. Ample time for discussion and  questions allowed. The patient understood, was satisfied, and agreed to proceed. The prep prescribed was Movi prep #5. Hold diabetic medication the morning of the exam until normal by mouth intake resumed

## 2011-02-03 ENCOUNTER — Encounter: Payer: Self-pay | Admitting: Internal Medicine

## 2011-02-03 ENCOUNTER — Encounter: Payer: Self-pay | Admitting: Gastroenterology

## 2011-02-05 ENCOUNTER — Encounter: Payer: Self-pay | Admitting: Pulmonary Disease

## 2011-02-05 ENCOUNTER — Ambulatory Visit (INDEPENDENT_AMBULATORY_CARE_PROVIDER_SITE_OTHER): Payer: Medicare Other | Admitting: Pulmonary Disease

## 2011-02-05 DIAGNOSIS — E782 Mixed hyperlipidemia: Secondary | ICD-10-CM

## 2011-02-05 DIAGNOSIS — E119 Type 2 diabetes mellitus without complications: Secondary | ICD-10-CM

## 2011-02-05 DIAGNOSIS — F411 Generalized anxiety disorder: Secondary | ICD-10-CM

## 2011-02-05 DIAGNOSIS — Z87898 Personal history of other specified conditions: Secondary | ICD-10-CM

## 2011-02-05 DIAGNOSIS — I2510 Atherosclerotic heart disease of native coronary artery without angina pectoris: Secondary | ICD-10-CM

## 2011-02-05 DIAGNOSIS — J449 Chronic obstructive pulmonary disease, unspecified: Secondary | ICD-10-CM

## 2011-02-05 DIAGNOSIS — I10 Essential (primary) hypertension: Secondary | ICD-10-CM

## 2011-02-05 DIAGNOSIS — J4489 Other specified chronic obstructive pulmonary disease: Secondary | ICD-10-CM

## 2011-02-05 NOTE — Progress Notes (Signed)
Subjective:    Patient ID: Jesse Santos, male    DOB: [birth year], 75 y.o.   MRN: [id number]  HPI  75 y/o WM w/ mult med problems here for a follow up visit>   ~  July 10, 2010:  mult somatic complaints- fell 10/1 w/ left knee injury & cortisone shot helped, c/o head congestion, CWP, sugars sl up, HA, etc... on a pos note- he stopped Vit E & urine flow is much better he says...  breathing stable- no ch in DOE;  BP controlled w/ diet, no salt;  Lipids OK on Niaspan;  DM control adeq on Metform;  refills written today.  ~  January 02, 2011:  52mo ROV & he notes lots of stress from dental problems etc; notes his BP has been up & he saw DrHochrein w/ Norvasc prescribed but he states this "stopped my urine flow- same thing happened to a guy at church"; now on Diovan 320mg /d & BP is improved> EKG was normal, he has a Myoview sched soon for screening eval...  Lipids remain adeq controlled on his Niacin;  DM control fair on his Metformin monotherapy (A1c=7.3 & may need additional medication) but he hasn't yet lost any wt & we reviewe3d diet & exercise recommendations;  He is c/o some dysphagia w/ "food stopping on occas"> takes Protonix, Miralax, Levsin- and we discussed f/u eval from DrPerry to check his esoph;  LABS today showed elev PSA 1.0==>4.9 over the last yr (we called in Doxy x14d w/ repeat PSA 10mo & refer to Urology if not back to norm)...  ~  Feb 05, 2011:  10mo ROV & BP remains well controlled on the Diovan320mg /d, he claims that Norvasc and HCTZ "both stopped my urine flow";  He had elev PSA at 4.9 & was treated w/ Doxy x10d> repeat PSA back to normal= 1.30;  He is sched for colonoscopy next wk w/ DrPerry... We reviewed his labs.       Problem List:  HEARING LOSS (ICD-389.9) - he has bilat hearing aides...  ALLERGIC RHINITIS (ICD-477.9) - on allergy shots from DrESL Laney Pastor)... plus Claritin, Saline, Mucinex, Flonase. ~  occas bouts of sinusitis requiring antibiotic Rx...  OBSTRUCTIVE  SLEEP APNEA (ICD-327.23) - sleep study 8/04 showed RDI= 45 & desat to 88%... mod snoring and leg jerks without sleep disruption... CPAP perscribed but not using it now- states he can't sleep w/ it on & furthermore he is resting well as long as he stays on his side... denies daytime hypersomnolence & not interested in re-evaluation.  COPD (ICD-496) - ex-smoker, quit 1981... on ADVAIR 100Bid, PROAIR Prn, MUCINEX Bid... he had Pneumovax in 2008... doesn't like Pred Rx...  ~  10/11:  doing well except recent cough, min green sputum (no change in dyspnea)- we will Rx w/ Augmentin, incr Mucinex/ Fluids. ~  4/12:  Breathing at baseline, continue maintenance meds...  HYPERTENSION (ICD-401.9) - prev on diet alone> now on DIOVAN 320mg /d... ~  He claims that Norvasc & HCTZ "stopped my urine flow"... ~  10/11:  BP= 128/80 & even better at home he says... denies HA, fatigue, visual changes, CP, palipit, dizziness, syncope, dyspnea, edema, etc... ~  4/12:  Pt has seen TP, DrHochrein, & checking BP daily at home; tried on Norvasc but claims it decr his urine flow; now on Diovan & improved w/ BP= 132/70... ~  5/12:  BP remains well controlled on Diovan 320mg /d> 120/62 today.  R/O CAD (ICD-414.00) - on ASA 81mg /d... Followed by DrHochrein &  his notes are reviewed. ~  hx neg cardiac cath 1988 [place]... ~  NuclearStressTest 10/02 was norm- no scar or ischemia, EF=55%. ~  CT Abd 4/10 via ER showed coronary calcif... referred to Cards. ~  Eval by DrHochrein 6/10 showed norm EKG & cardiac exam... treadmill ordered. ~  Treadmill 6/10 showed reasonable exerc tolerance, no ischemic changes, few PVCs, sl incr BP... ~  Follow up Myoview scheduled 4/12 ==> pending  MIXED HYPERLIPIDEMIA (ICD-272.2) - low HDL and NIASPAN 500mg /d started 4/09... ~  FLP 4/09 showed TChol 160, TG 106, HDL 26, LDL 113... rec- diet + Niaspan 500/d. ~  FLP 10/09 showed TChol 155, TG 78, HDL 33, LDL 106... ~  FLP 4/10 showed TChol 133,  TG 63, HDL 30, LDL 91... rec> continue same. ~  FLP 4/11 showed TChol 144, TG 61, HDL 38, LDL 94 ~  FLP 10/11 showed TChol 155, TG 81, HDL 40, LDL 99 ~  FLP 4/12 showed TChol 142, TG 69, HDL 38, LDL 90  DM (ICD-250.00) - on diet + METFORMIN 500mg Bid... he reports BS at home all in the 120-130's... ~  labs 4/09 showed BS= 132, HgA1c= 6.6.Marland KitchenMarland Kitchen rec- same med, better diet... ~  labs 10/09 showed BS= 131, HgA1c= 6.3.Marland KitchenMarland Kitchen ~  labs 4/10 showed BS= 117, HgA1c= 6.2.[name]. rec> same meds/ diet Rx. ~  labs 10/10 showed BS= 138, A1c= 6.5 ~  labs 4/11 showed BS= 121, A1c= 6.5.Marland KitchenMarland Kitchen On Metform500Bid + diet. ~  Dilated eye exam 5/11 by DrShapiro- no retinopathy... ~  labs 10/11 (wt=228#) showed BS= 118, A1c= 7.1.[name]. not as good- get on diet or more meds. ~  Labs 4/12 (wt=229#) showed BS= 119, A1c= 7.3... Wrong direction, may need more meds, get wt down!  GERD (ICD-530.81) & ? GASTROPARESIS - on PROTONIX 40mg /d, & off Reglan per DrPerry... ~   last EGD 6/07 by DrPerry showed gastic polyp... ~  8/10:  given Compazine suppos for gastroparesis flair by DrGessner. ~  4/12:  C/o intermittent dysphagia & referred back to GI for further eval (colonoscopy due as well)...  DIVERTICULOSIS OF COLON (ICD-562.10) - he uses MIRALAX Bid regularly... IRRITABLE BOWEL SYNDROME (ICD-564.1) COLONIC POLYPS (ICD-211.3) ~  last colonoscopy 5/07 by DrSam showed divertics only... f/u 12yrs. ~  CT Abd 4/10 in ER showed atx bases, coronary calcif, s/p GB, abdAo calcif, 11mm renal stone, divertics, bilat hip AVN.  RENAL CALCULUS, HX OF (ICD-V13.01) BENIGN PROSTATIC HYPERTROPHY, HX OF (ICD-V13.8) - on FLOMAX 0.4mg /d w/ improved symptoms (he states flow better since stopping Vit E supplement). ~  4/12:  Routine PSA= 4.9 (it was 1.02 4/11) & we rx w/ Doxy Bid x14d, thewn plan recheck PSA==> 1.30  DEGENERATIVE JOINT DISEASE (ICD-715.90) - pt states "I have a bum knee" and eval by DrDuda w/ viscosupplementation shots... Note: Abd CT w/ bilat hip  AVN noted... takes TYLENOL Prn... ~  11/10:  s/p left knee arthroscopy by DrDuda... ~  10/11:  s/p fall w/ left knee injury- s/p cortisone shot by DrDuda; hx shot in left shoulder too.  BACK PAIN, LUMBAR (ICD-724.2)  ANXIETY (ICD-300.00) - not currently on meds for nerves.  ANEMIA (ICD-285.9) - prev hx of GIB... 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  Health Maintenance: ~  GI: followed by DrPerry> Colonoscopy 5/07 by DrSam..[name]  f/u planned 5 yrs. ~  GU:  See PSAs recorded above... ~  Immunizations: Pneumovax in 2008 @ age 71;  Tetanus- given 4/10;  he gets yearly seasonal Flu vaccine.   Past Surgical History  Procedure Date  . Cholecystectomy   . Cataract extraction   . Skin cancer excision 3/10    left hand   . Knee surgery 11/10    Outpatient Encounter Prescriptions as of 02/05/2011  Medication Sig Dispense Refill  . aspirin 81 MG tablet Take 81 mg by mouth daily.        . Cholecalciferol (VITAMIN D) 2000 UNITS CAPS Take 1 capsule by mouth daily.        . fluticasone (FLONASE) 50 MCG/ACT nasal spray 2 sprays by Nasal route daily. As needed       . fluticasone-salmeterol (ADVAIR HFA) 115-21 MCG/ACT inhaler Inhale 2 puffs into the lungs 2 (two) times daily. One puff twice a day       . Guaifenesin (MUCINEX MAXIMUM STRENGTH) 1200 MG TB12 Take by mouth.        . hyoscyamine (LEVSIN SL) 0.125 MG SL tablet Place 0.125 mg under the tongue every 4 (four) hours as needed. As needed for abdominal cramping       . loratadine (CLARITIN REDITABS) 10 MG dissolvable tablet Take 10 mg by mouth daily.        . metFORMIN (GLUCOPHAGE) 500 MG tablet Take 1 tablet (500 mg total) by mouth 2 (two) times daily with a meal.  60 tablet  11  . Multiple Vitamins-Minerals (MULTIVITAMIN WITH MINERALS) tablet Take 1 tablet by mouth daily.        . niacin (NIASPAN) 500 MG CR  tablet Take 1 tablet (500 mg total) by mouth at bedtime.  30 tablet  11  . pantoprazole (PROTONIX) 40 MG tablet Take 1 tablet (40 mg total) by mouth daily.  30 tablet  11  . polyethylene glycol (MIRALAX) powder Take 17 g by mouth 2 (two) times daily. Take as directed      . prochlorperazine (COMPRO) 25 MG suppository Place 1 suppository (25 mg total) rectally every 12 (twelve) hours as needed for nausea.  12 suppository  0  . sodium chloride (OCEAN) 0.65 % nasal spray 1 spray by Nasal route as needed.        . Tamsulosin HCl (FLOMAX) 0.4 MG CAPS Take 1 tablet by mouth daily.      [name] triamcinolone (KENALOG) 0.1 % cream Apply topically 2 (two) times daily. As directed       . valsartan (DIOVAN) 320 MG tablet Take 1 tablet (320 mg total) by mouth daily.  30 tablet  11     Allergies  Allergen Reactions  . Prednisone     REACTION: high dose intolerance    Review of Systems         See HPI - all other systems neg except as noted... The patient complains of decreased hearing, dyspnea on exertion, headaches, and difficulty walking.  The patient denies anorexia, fever, weight loss, weight gain, vision loss, hoarseness, chest pain, syncope, peripheral edema, prolonged cough, hemoptysis, abdominal pain, melena, hematochezia, severe indigestion/heartburn, hematuria, incontinence, muscle weakness, suspicious skin lesions, transient blindness, depression, unusual weight change, abnormal bleeding, enlarged lymph nodes, and angioedema.     Objective:   Physical Exam      WD, WN, 75 y/o WM in NAD... GENERAL:  Alert & oriented; pleasant & cooperative... HEENT:  [place]/AT, EOM-full, PERRLA, EACs-clear, TMs-wnl, NOSE- pale,  congested, THROAT-clear & wnl. NECK:  Supple w/ fairROM; no JVD; normal carotid impulses w/o bruits; no thyromegaly or nodules palpated; no lymphadenopathy. CHEST:  Clear to P & A; without wheezes/ rales/ or rhonchi heard... HEART:  Regular Rhythm; without murmurs/ rubs/ or gallops  detected...  ABDOMEN:  Soft & nontender; normal bowel sounds; no organomegaly or masses palpated., no guarding, or rebound EXT: without deformities, mild arthritic changes; no varicose veins/ venous insuffic/ or edema. NEURO:  CN's intact;  no focal neuro deficits... DERM:  No lesions noted; no rash etc...   Assessment & Plan:   COPD>  He continues on Advair, Proair rescue, Mucinex; also Claritin, Flonase, Saline; stable continue same Rx...  HBP>  Seems well controlled on Diovan 320mg /d now, continue same.  R/O CAD>  Myoview pending from DrHochrein, continue ASA...  Lipids>  Looks satis on his diet + Niacin...  DM>  Control is worse w/ A1c up to 7.3; he was told there are more meds in his future if he doesn't get his wt down (currently~230#)...  GI>  Per DrPerry w/ GERD, ?Gastroparesis, Divertics, IBS, Colon polyps> due for f/u colonoscopy & c/o dysphagia, therefore refer to DrPerry for EGD & Colon...  Anxiety>  Certainly an issue, he does not want anxiolytic Rx.Marland KitchenMarland Kitchen

## 2011-02-05 NOTE — Patient Instructions (Signed)
Today we updated your med list in EPIC...    Your BP looks good> continue your same meds...  Call for any questions...  Let's plan a follow up visit in 6 months, sooner if needed for problems.Marland KitchenMarland Kitchen

## 2011-02-08 ENCOUNTER — Encounter: Payer: Self-pay | Admitting: Pulmonary Disease

## 2011-02-10 ENCOUNTER — Encounter: Payer: Self-pay | Admitting: Pulmonary Disease

## 2011-02-10 ENCOUNTER — Telehealth: Payer: Self-pay | Admitting: Internal Medicine

## 2011-02-10 NOTE — Telephone Encounter (Signed)
Spoke with pt and he had questions regarding his prep for the procedure. Reviewed instructions with pt and all questions answered.

## 2011-02-11 ENCOUNTER — Encounter: Payer: Self-pay | Admitting: Internal Medicine

## 2011-02-12 ENCOUNTER — Ambulatory Visit (AMBULATORY_SURGERY_CENTER): Payer: Medicare Other | Admitting: Internal Medicine

## 2011-02-12 ENCOUNTER — Encounter: Payer: Self-pay | Admitting: Internal Medicine

## 2011-02-12 ENCOUNTER — Ambulatory Visit: Payer: Medicare Other | Admitting: Cardiology

## 2011-02-12 VITALS — BP 131/91 | HR 75 | Temp 97.6°F | Resp 20 | Ht 73.0 in | Wt 218.0 lb

## 2011-02-12 DIAGNOSIS — Z860100 Personal history of colon polyps, unspecified: Secondary | ICD-10-CM

## 2011-02-12 DIAGNOSIS — Z1211 Encounter for screening for malignant neoplasm of colon: Secondary | ICD-10-CM

## 2011-02-12 DIAGNOSIS — D126 Benign neoplasm of colon, unspecified: Secondary | ICD-10-CM

## 2011-02-12 DIAGNOSIS — K635 Polyp of colon: Secondary | ICD-10-CM

## 2011-02-12 DIAGNOSIS — K5730 Diverticulosis of large intestine without perforation or abscess without bleeding: Secondary | ICD-10-CM

## 2011-02-12 HISTORY — DX: Polyp of colon: K63.5

## 2011-02-12 MED ORDER — SODIUM CHLORIDE 0.9 % IV SOLN: 500.0000 mL | INTRAVENOUS | Status: DC

## 2011-02-12 NOTE — Patient Instructions (Signed)
[employer] reviewed with patient and care partner.  [facility] Discharge Instructions signed by care partner.  Impressions/Recommendations:  Polyps two--removed (handout given)  Diverticulosis (handout given)  High fiber diet handout given.  Follow up colonoscopy as needed if problems.

## 2011-02-13 ENCOUNTER — Telehealth: Payer: Self-pay

## 2011-02-13 NOTE — Telephone Encounter (Signed)

## 2011-05-28 ENCOUNTER — Telehealth: Payer: Self-pay | Admitting: Pulmonary Disease

## 2011-05-28 NOTE — Telephone Encounter (Signed)
Leigh, pls advise if okay to send order to [facility] for hearing test through this clinic. Thanks

## 2011-05-28 NOTE — Telephone Encounter (Signed)
Per SN---ok to send in referral for hearing test. thanks

## 2011-06-01 ENCOUNTER — Ambulatory Visit (INDEPENDENT_AMBULATORY_CARE_PROVIDER_SITE_OTHER): Payer: Medicare Other

## 2011-06-01 DIAGNOSIS — Z23 Encounter for immunization: Secondary | ICD-10-CM

## 2011-06-04 LAB — DIFFERENTIAL
Basophils Relative: 0
Eosinophils Absolute: 0.1
Neutrophils Relative %: 89 — ABNORMAL HIGH

## 2011-06-04 LAB — URINALYSIS, ROUTINE W REFLEX MICROSCOPIC
Hgb urine dipstick: NEGATIVE
Specific Gravity, Urine: 1.036 — ABNORMAL HIGH
Urobilinogen, UA: 0.2

## 2011-06-04 LAB — COMPREHENSIVE METABOLIC PANEL
ALT: 20
Alkaline Phosphatase: 89
CO2: 25
Chloride: 103
GFR calc non Af Amer: >60
Glucose, Bld: 188 — ABNORMAL HIGH
Potassium: 4.1
Sodium: 139

## 2011-06-04 LAB — CBC
Hemoglobin: 14.8
RBC: 5.02
WBC: 16.8 — ABNORMAL HIGH

## 2011-06-04 LAB — LIPASE, BLOOD: Lipase: 25

## 2011-06-05 LAB — COMPREHENSIVE METABOLIC PANEL
ALT: 19
AST: 24
Alkaline Phosphatase: 86
CO2: 24
Calcium: 9.1
Chloride: 104
GFR calc non Af Amer: >60
Glucose, Bld: 196 — ABNORMAL HIGH
Potassium: 4.4
Sodium: 138
Total Bilirubin: 0.7

## 2011-06-05 LAB — DIFFERENTIAL
Basophils Absolute: 0
Basophils Relative: 0
Eosinophils Absolute: 0
Eosinophils Relative: 0
Neutrophils Relative %: 93 — ABNORMAL HIGH

## 2011-06-05 LAB — CK TOTAL AND CKMB (NOT AT ARMC): Relative Index: INVALID

## 2011-06-05 LAB — STOOL CULTURE

## 2011-06-05 LAB — TROPONIN I: Troponin I: 0.03

## 2011-06-05 LAB — URINALYSIS, ROUTINE W REFLEX MICROSCOPIC
Bilirubin Urine: NEGATIVE
Glucose, UA: NEGATIVE
Ketones, ur: NEGATIVE
Protein, ur: NEGATIVE

## 2011-06-05 LAB — CLOSTRIDIUM DIFFICILE EIA: C difficile Toxins A+B, EIA: NEGATIVE

## 2011-06-05 LAB — CBC
Hemoglobin: 14.9
MCHC: 34
RBC: 5.01
WBC: 14.8 — ABNORMAL HIGH

## 2011-06-05 LAB — URINE CULTURE

## 2011-06-05 LAB — CULTURE, BLOOD (ROUTINE X 2)

## 2011-06-05 LAB — AMYLASE: Amylase: 28

## 2011-07-10 ENCOUNTER — Ambulatory Visit (INDEPENDENT_AMBULATORY_CARE_PROVIDER_SITE_OTHER)
Admission: RE | Admit: 2011-07-10 | Discharge: 2011-07-10 | Disposition: A | Discharge status: Home / Self Care | Payer: Medicare Other | Source: Ambulatory Visit | Attending: Adult Health | Admitting: Adult Health

## 2011-07-10 ENCOUNTER — Encounter: Payer: Self-pay | Admitting: Adult Health

## 2011-07-10 ENCOUNTER — Telehealth: Payer: Self-pay | Admitting: Pulmonary Disease

## 2011-07-10 ENCOUNTER — Ambulatory Visit (INDEPENDENT_AMBULATORY_CARE_PROVIDER_SITE_OTHER): Payer: Medicare Other | Admitting: Adult Health

## 2011-07-10 VITALS — BP 142/76 | HR 77 | Temp 98.3°F | Ht 74.0 in | Wt 231.4 lb

## 2011-07-10 DIAGNOSIS — J449 Chronic obstructive pulmonary disease, unspecified: Secondary | ICD-10-CM

## 2011-07-10 MED ORDER — LEVALBUTEROL HCL 0.63 MG/3ML IN NEBU: 0.6300 mg | INHALATION_SOLUTION | Freq: Once | RESPIRATORY_TRACT | Status: DC

## 2011-07-10 MED ORDER — AMOXICILLIN-POT CLAVULANATE 875-125 MG PO TABS: 1.0000 | ORAL_TABLET | Freq: Two times a day (BID) | ORAL | Status: AC

## 2011-07-10 NOTE — Progress Notes (Signed)
Addended by: Tommie Sams on: 07/10/2011 11:55 AM   Modules accepted: Orders

## 2011-07-10 NOTE — Patient Instructions (Signed)
Begin Augmentin 875 mg twice daily with food for 7 days. Continue on Mucinex twice daily Fluids and rest Tylenol as needed for body aches and fever. I will call you with chest x-ray results when available. Followup as directed with Dr. Kriste Basque on an as needed  Please contact office for sooner follow up if symptoms do not improve or worsen or seek emergency care

## 2011-07-10 NOTE — Progress Notes (Signed)
Addended by: Tommie Sams on: 07/10/2011 11:25 AM   Modules accepted: Orders

## 2011-07-10 NOTE — Assessment & Plan Note (Signed)
COPD exacerbation with associated early sinusitis and/or bronchitis We'll give Xopenex nebulizer treatment office  We'll check chest x-ray today to rule out pneumonia.  Plan Begin Augmentin x7 days. Continue on Mucinex DM twice daily. Continue supportive care with fluids, rest,  Tylenol.

## 2011-07-10 NOTE — Telephone Encounter (Signed)
Pt requesting an appt today for chest congestion/cough and back pain for 4-5 days. No fever today. Appt scheduled with TP today at 10:30am.
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
## 2011-07-23 ENCOUNTER — Telehealth: Payer: Self-pay | Admitting: Pulmonary Disease

## 2011-07-23 DIAGNOSIS — H9190 Unspecified hearing loss, unspecified ear: Secondary | ICD-10-CM

## 2011-07-23 NOTE — Telephone Encounter (Signed)
Order has been sent to [facility] for pt to have appt [facility].

## 2011-08-05 ENCOUNTER — Other Ambulatory Visit: Payer: Self-pay | Admitting: Pulmonary Disease

## 2011-08-14 ENCOUNTER — Encounter: Payer: Self-pay | Admitting: Pulmonary Disease

## 2011-08-14 ENCOUNTER — Other Ambulatory Visit (INDEPENDENT_AMBULATORY_CARE_PROVIDER_SITE_OTHER): Payer: Medicare Other

## 2011-08-14 ENCOUNTER — Ambulatory Visit (INDEPENDENT_AMBULATORY_CARE_PROVIDER_SITE_OTHER): Payer: Medicare Other | Admitting: Pulmonary Disease

## 2011-08-14 DIAGNOSIS — I10 Essential (primary) hypertension: Secondary | ICD-10-CM

## 2011-08-14 DIAGNOSIS — K219 Gastro-esophageal reflux disease without esophagitis: Secondary | ICD-10-CM

## 2011-08-14 DIAGNOSIS — M1990 Unspecified osteoarthritis, unspecified site: Secondary | ICD-10-CM

## 2011-08-14 DIAGNOSIS — G4733 Obstructive sleep apnea (adult) (pediatric): Secondary | ICD-10-CM

## 2011-08-14 DIAGNOSIS — I2510 Atherosclerotic heart disease of native coronary artery without angina pectoris: Secondary | ICD-10-CM

## 2011-08-14 DIAGNOSIS — D126 Benign neoplasm of colon, unspecified: Secondary | ICD-10-CM

## 2011-08-14 DIAGNOSIS — E119 Type 2 diabetes mellitus without complications: Secondary | ICD-10-CM

## 2011-08-14 DIAGNOSIS — J449 Chronic obstructive pulmonary disease, unspecified: Secondary | ICD-10-CM

## 2011-08-14 DIAGNOSIS — K5730 Diverticulosis of large intestine without perforation or abscess without bleeding: Secondary | ICD-10-CM

## 2011-08-14 DIAGNOSIS — E782 Mixed hyperlipidemia: Secondary | ICD-10-CM

## 2011-08-14 DIAGNOSIS — F411 Generalized anxiety disorder: Secondary | ICD-10-CM

## 2011-08-14 LAB — BASIC METABOLIC PANEL
CO2: 26 mEq/L (ref 19–32)
GFR: 86.58 mL/min (ref >60.00)
Glucose, Bld: 136 mg/dL — ABNORMAL HIGH (ref 70–99)
Potassium: 4.9 mEq/L (ref 3.5–5.1)
Sodium: 140 mEq/L (ref 135–145)

## 2011-08-14 LAB — LIPID PANEL
Total CHOL/HDL Ratio: 4
VLDL: 16.4 mg/dL (ref 0.0–40.0)

## 2011-08-14 MED ORDER — NIACIN ER (ANTIHYPERLIPIDEMIC) 500 MG PO TBCR: 500.0000 mg | EXTENDED_RELEASE_TABLET | Freq: Every day | ORAL | Status: DC

## 2011-08-14 MED ORDER — VALSARTAN 160 MG PO TABS: 160.0000 mg | ORAL_TABLET | Freq: Every day | ORAL | Status: DC

## 2011-08-14 MED ORDER — FLUTICASONE PROPIONATE 50 MCG/ACT NA SUSP: 2.0000 | Freq: Every day | NASAL | Status: DC

## 2011-08-14 MED ORDER — FLUTICASONE-SALMETEROL 115-21 MCG/ACT IN AERO: 2.0000 | INHALATION_SPRAY | Freq: Two times a day (BID) | RESPIRATORY_TRACT | Status: DC

## 2011-08-14 MED ORDER — SILDENAFIL CITRATE 100 MG PO TABS: 100.0000 mg | ORAL_TABLET | ORAL | Status: DC | PRN

## 2011-08-14 MED ORDER — POLYETHYLENE GLYCOL 3350 17 GM/SCOOP PO POWD: 17.0000 g | Freq: Two times a day (BID) | ORAL | Status: DC

## 2011-08-14 MED ORDER — METFORMIN HCL 500 MG PO TABS: 500.0000 mg | ORAL_TABLET | Freq: Two times a day (BID) | ORAL | Status: DC

## 2011-08-14 MED ORDER — PANTOPRAZOLE SODIUM 40 MG PO TBEC: 40.0000 mg | DELAYED_RELEASE_TABLET | Freq: Every day | ORAL | Status: DC

## 2011-08-14 NOTE — Progress Notes (Signed)
Subjective:    Patient ID: Jesse Santos, male    DOB: 1935-11-03, 75 y.o.   MRN: [id number]  HPI 75 y/o WM w/ mult med problems here for a follow up visit>   ~  July 10, 2010:  mult somatic complaints- fell 10/1 w/ left knee injury & cortisone shot helped, c/o head congestion, CWP, sugars sl up, HA, etc... on a pos note- he stopped Vit E & urine flow is much better he says...  breathing stable- no ch in DOE;  BP controlled w/ diet, no salt;  Lipids OK on Niaspan;  DM control adeq on Metform;  refills written today.  ~  January 02, 2011:  35mo ROV & he notes lots of stress from dental problems etc; notes his BP has been up & he saw DrHochrein w/ Norvasc prescribed but he states this "stopped my urine flow- same thing happened to a guy at church"; now on Diovan 320mg /d & BP is improved> EKG was normal, he has a Myoview sched soon for screening eval...  Lipids remain adeq controlled on his Niacin;  DM control fair on his Metformin monotherapy (A1c=7.3 & may need additional medication) but he hasn't yet lost any wt & we reviewe3d diet & exercise recommendations;  He is c/o some dysphagia w/ "food stopping on occas"> takes Protonix, Miralax, Levsin- and we discussed f/u eval from DrPerry to check his esoph;  LABS today showed elev PSA 1.0==>4.9 over the last yr (we called in Doxy x14d w/ repeat PSA 5mo & refer to Urology if not back to norm)...  ~  Feb 05, 2011:  5mo ROV & BP remains well controlled on the Diovan320mg /d, he claims that Norvasc and HCTZ "both stopped my urine flow";  He had elev PSA at 4.9 & was treated w/ Doxy x10d> repeat PSA back to normal= 1.30;  He is sched for colonoscopy next wk w/ DrPerry... We reviewed his labs.  ~  August 14, 2011:  73mo ROV & he saw TP ~64mo ago w/ URI/ bronchitis, treated w/ Augmentin, Mucinex, Tylenol & improved;  His COPD is controlled w/ Advair, Proair, Mucinex & we reviewed regular dosing;  BP controlled on Diovan but he refuses the 320 dose saying it  caused HA & he knows that it can elev the BS too; he decr to 1/2 the dose & says that his BP is ok on this dose; he denies CP, palpit, dizzy, SOB, edema...  See prob list below>>        Problem List:  HEARING LOSS (ICD-389.9) - he has bilat hearing aides...  ALLERGIC RHINITIS (ICD-477.9) - on allergy shots from DrESL Laney Pastor)... plus Claritin, Saline, Mucinex, Flonase. ~  occas bouts of sinusitis requiring antibiotic Rx...  OBSTRUCTIVE SLEEP APNEA (ICD-327.23) - sleep study 8/04 showed RDI= 45 & desat to 88%... mod snoring and leg jerks without sleep disruption... CPAP perscribed but not using it now- states he can't sleep w/ it on & furthermore he is resting well as long as he stays on his side... denies daytime hypersomnolence & not interested in re-evaluation.  COPD (ICD-496) - ex-smoker, quit 1981... on ADVAIR 100Bid, PROAIR Prn, MUCINEX Bid... he had Pneumovax in 2008... doesn't like Pred Rx...  ~  10/11:  doing well except recent cough, min green sputum (no change in dyspnea)- we will Rx w/ Augmentin, incr Mucinex/ Fluids. ~  4/12:  Breathing at baseline, continue maintenance meds...  HYPERTENSION (ICD-401.9) - prev on diet alone> now on DIOVAN 320mg /d... ~  He claims that Norvasc & HCTZ "stopped my urine flow"... ~  10/11:  BP= 128/80 & even better at home he says... denies HA, fatigue, visual changes, CP, palipit, dizziness, syncope, dyspnea, edema, etc... ~  4/12:  Pt has seen TP, DrHochrein, & checking BP daily at home; tried on Norvasc but claims it decr his urine flow; now on Diovan & improved w/ BP= 132/70... ~  5/12:  BP remains well controlled on Diovan 320mg /d> 120/62 today.  R/O CAD (ICD-414.00) - on ASA 81mg /d... Followed by DrHochrein & his notes are reviewed. ~  hx neg cardiac cath 1988 by DrJoe Clinch... ~  NuclearStressTest 10/02 was norm- no scar or ischemia, EF=55%. ~  CT Abd 4/10 via ER showed coronary calcif... referred to Cards. ~  Eval by DrHochrein 6/10 showed  norm EKG & cardiac exam... treadmill ordered. ~  Treadmill 6/10 showed reasonable exerc tolerance, no ischemic changes, few PVCs, sl incr BP... ~  Follow up Myoview scheduled 4/12 ==> pending  MIXED HYPERLIPIDEMIA (ICD-272.2) - low HDL and NIASPAN 500mg /d started 4/09... ~  FLP 4/09 showed TChol 160, TG 106, HDL 26, LDL 113... rec- diet + Niaspan 500/d. ~  FLP 10/09 showed TChol 155, TG 78, HDL 33, LDL 106... ~  FLP 4/10 showed TChol 133, TG 63, HDL 30, LDL 91... rec> continue same. ~  FLP 4/11 showed TChol 144, TG 61, HDL 38, LDL 94 ~  FLP 10/11 showed TChol 155, TG 81, HDL 40, LDL 99 ~  FLP 4/12 showed TChol 142, TG 69, HDL 38, LDL 90 ~  FLP 11/12 showed TChol 165, TG 82, HDL 43, LDL 106  DM (ICD-250.00) - on diet + METFORMIN 500mg Bid... he reports BS at home all in the 120-130's... ~  labs 4/09 showed BS= 132, HgA1c= 6.6.Marland KitchenMarland Kitchen rec- same med, better diet... ~  labs 10/09 showed BS= 131, HgA1c= 6.3.Marland KitchenMarland Kitchen ~  labs 4/10 showed BS= 117, HgA1c= 6.2.[name]. rec> same meds/ diet Rx. ~  labs 10/10 showed BS= 138, A1c= 6.5 ~  labs 4/11 showed BS= 121, A1c= 6.5.Marland KitchenMarland Kitchen On Metform500Bid + diet. ~  Dilated eye exam 5/11 by DrShapiro- no retinopathy... ~  labs 10/11 (wt=228#) showed BS= 118, A1c= 7.1.[name]. not as good- get on diet or more meds. ~  Labs 4/12 (wt=229#) showed BS= 119, A1c= 7.3... Wrong direction, may need more meds, get wt down! ~  5/12:  Ophthalmology check by DrShapiro was neg- no retinopathy... ~  Labs 11/12 showed BS= 136, A1c= 7.0  GERD (ICD-530.81) & ? GASTROPARESIS - on PROTONIX 40mg /d, & off Reglan per DrPerry... ~   last EGD 6/07 by DrPerry showed gastic polyp... ~  8/10:  given Compazine suppos for gastroparesis flair by DrGessner. ~  4/12:  C/o intermittent dysphagia & referred back to GI for further eval (colonoscopy due as well)...  DIVERTICULOSIS OF COLON (ICD-562.10) - he uses MIRALAX Bid regularly... IRRITABLE BOWEL SYNDROME (ICD-564.1) COLONIC POLYPS (ICD-211.3) ~  last colonoscopy  5/07 by DrSam showed divertics only... f/u 20yrs. ~  CT Abd 4/10 in ER showed atx bases, coronary calcif, s/p GB, abdAo calcif, 11mm renal stone, divertics, bilat hip AVN. ~  Follow up colonoscopy by DrPerry 5/12 showed severe diverticulosis, 2 sm polyps= tubular adenoma & f/u planned 5 yrs...  RENAL CALCULUS, HX OF (ICD-V13.01) BENIGN PROSTATIC HYPERTROPHY, HX OF (ICD-V13.8) - on FLOMAX 0.4mg /d w/ improved symptoms (he states flow better since stopping Vit E supplement). ~  4/12:  Routine PSA= 4.9 (it was 1.02 4/11) &  we rx w/ Doxy Bid x14d, thewn plan recheck PSA==> 1.30  DEGENERATIVE JOINT DISEASE (ICD-715.90) - pt states "I have a bum knee" and eval by DrDuda w/ viscosupplementation shots... Note: Abd CT w/ bilat hip AVN noted... takes TYLENOL Prn... ~  11/10:  s/p left knee arthroscopy by DrDuda... ~  10/11:  s/p fall w/ left knee injury- s/p cortisone shot by DrDuda; hx shot in left shoulder too.  BACK PAIN, LUMBAR (ICD-724.2)  ANXIETY (ICD-300.00) - not currently on meds for nerves.  ANEMIA (ICD-285.9) - prev hx of GIB... 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  Health Maintenance: ~  GI: followed by DrPerry> Colonoscopy 5/07 by DrSam... f/u planned 5 yrs. ~  GU:  See PSAs recorded above... ~  Immunizations: Pneumovax in 2008 @ age 74;  Tetanus- given 4/10;  he gets yearly seasonal Flu vaccine.   Past Surgical History  Procedure Date  . Cholecystectomy   . Cataract extraction   . Skin cancer excision 3/10    left hand   . Knee surgery 11/10    Outpatient Encounter Prescriptions as of 08/14/2011  Medication Sig Dispense Refill  . aspirin 81 MG tablet Take 81 mg by mouth daily.        . Cholecalciferol (VITAMIN D) 2000 UNITS CAPS Take 1 capsule by mouth daily.        . fluticasone (FLONASE) 50 MCG/ACT nasal spray 2 sprays by Nasal route  daily. As needed       . fluticasone-salmeterol (ADVAIR HFA) 115-21 MCG/ACT inhaler Inhale 2 puffs into the lungs 2 (two) times daily. One puff twice a day       . Guaifenesin (MUCINEX MAXIMUM STRENGTH) 1200 MG TB12 Take by mouth.        . hyoscyamine (LEVSIN SL) 0.125 MG SL tablet Place 0.125 mg under the tongue every 4 (four) hours as needed. As needed for abdominal cramping       . loratadine (CLARITIN REDITABS) 10 MG dissolvable tablet Take 10 mg by mouth daily.        . metFORMIN (GLUCOPHAGE) 500 MG tablet Take 1 tablet (500 mg total) by mouth 2 (two) times daily with a meal.  60 tablet  11  . Multiple Vitamins-Minerals (MULTIVITAMIN WITH MINERALS) tablet Take 1 tablet by mouth daily.        . niacin (NIASPAN) 500 MG CR tablet Take 1 tablet (500 mg total) by mouth at bedtime.  30 tablet  11  . pantoprazole (PROTONIX) 40 MG tablet Take 1 tablet (40 mg total) by mouth daily.  30 tablet  11  . polyethylene glycol (MIRALAX) powder Take 17 g by mouth 2 (two) times daily. Take as directed      . prochlorperazine (COMPRO) 25 MG suppository Place 1 suppository (25 mg total) rectally every 12 (twelve) hours as needed for nausea.  12 suppository  0  . sodium chloride (OCEAN) 0.65 % nasal spray 1 spray by Nasal route as needed.        . Tamsulosin HCl (FLOMAX) 0.4 MG CAPS TAKE ONE CAPSULE EVERY DAY  30 capsule  11  . triamcinolone (KENALOG) 0.1 % cream Apply topically 2 (two) times daily. As directed       . valsartan (DIOVAN) 320 MG tablet Take 320 mg by mouth every other  day.        [name] DISCONTD: valsartan (DIOVAN) 320 MG tablet Take 1 tablet (320 mg total) by mouth daily.  30 tablet  11   Facility-Administered Encounter Medications as of 08/14/2011  Medication Dose Route Frequency Provider Last Rate Last Dose  . levalbuterol (XOPENEX) nebulizer solution 0.63 mg  0.63 mg Nebulization Once Tammy Parrett, NP      . DISCONTD: 0.9 %  sodium chloride infusion  500 mL Intravenous Continuous Yancey Flemings, MD         Allergies  Allergen Reactions  . Prednisone     REACTION: high dose intolerance    Current Medications, Allergies, Past Medical History, Past Surgical History, Family History, and Social History were reviewed in [employer] record.    Review of Systems         See HPI - all other systems neg except as noted... The patient complains of decreased hearing, dyspnea on exertion, headaches, and difficulty walking.  The patient denies anorexia, fever, weight loss, weight gain, vision loss, hoarseness, chest pain, syncope, peripheral edema, prolonged cough, hemoptysis, abdominal pain, melena, hematochezia, severe indigestion/heartburn, hematuria, incontinence, muscle weakness, suspicious skin lesions, transient blindness, depression, unusual weight change, abnormal bleeding, enlarged lymph nodes, and angioedema.     Objective:   Physical Exam      WD, WN, 75 y/o WM in NAD... GENERAL:  Alert & oriented; pleasant & cooperative... HEENT:  [place]/AT, EOM-full, PERRLA, EACs-clear, TMs-wnl, NOSE- pale, congested, THROAT-clear & wnl. NECK:  Supple w/ fairROM; no JVD; normal carotid impulses w/o bruits; no thyromegaly or nodules palpated; no lymphadenopathy. CHEST:  Clear to P & A; without wheezes/ rales/ or rhonchi heard... HEART:  Regular Rhythm; without murmurs/ rubs/ or gallops detected...  ABDOMEN:  Soft & nontender; normal bowel sounds; no organomegaly or masses palpated., no guarding, or rebound EXT: without deformities, mild arthritic changes; no varicose veins/ venous insuffic/ or edema. NEURO:  CN's intact;  no focal neuro deficits... DERM:  No lesions noted; no rash etc...   Assessment & Plan:   COPD>  He continues on Advair, Proair rescue, Mucinex; also Claritin, Flonase, Saline; stable continue same Rx...  HBP>  Seems adeq controlled on Diovan 160mg /d now, continue same.  R/O CAD>  Followed by DrHochrein, continue ASA...  Lipids>  Looks satis on his diet +  Niacin...  DM>  Control is improved w/ A1c down to 7.0; he was told there are more meds in his future if he doesn't get his wt down (currently~230#)...  GI>  Per DrPerry w/ GERD, ?Gastroparesis, Divertics, IBS, Colon polyps> f/u colon 5/12 w/ divertics & 2 sm adenomas, repeat planned ~28yrs...  Anxiety>  Certainly an issue, he does not want anxiolytic Rx.Marland KitchenMarland Kitchen

## 2011-08-14 NOTE — Patient Instructions (Signed)
Today we updated your med list in our EPIC system...    Continue your current medications the same...  Today we did your follow up fasting blood work...    Please call the [facility] in a few days for your results...    Dial [id number] & when prompted enter your patient number followed by the # symbol...    Your patient number is:  [id number]#  Stay as active as poss & work on diet & weight reduction...  Let's plan a follow up visit in 6 months.Marland KitchenMarland Kitchen

## 2011-08-16 ENCOUNTER — Encounter: Payer: Self-pay | Admitting: Pulmonary Disease

## 2011-09-16 DIAGNOSIS — J309 Allergic rhinitis, unspecified: Secondary | ICD-10-CM | POA: Diagnosis not present

## 2011-09-18 DIAGNOSIS — J309 Allergic rhinitis, unspecified: Secondary | ICD-10-CM | POA: Diagnosis not present

## 2011-09-21 DIAGNOSIS — J309 Allergic rhinitis, unspecified: Secondary | ICD-10-CM | POA: Diagnosis not present

## 2011-09-23 DIAGNOSIS — J309 Allergic rhinitis, unspecified: Secondary | ICD-10-CM | POA: Diagnosis not present

## 2011-09-25 ENCOUNTER — Telehealth: Payer: Self-pay | Admitting: Pulmonary Disease

## 2011-09-25 MED ORDER — AZITHROMYCIN 250 MG PO TABS: ORAL_TABLET | ORAL | Status: AC

## 2011-09-25 NOTE — Telephone Encounter (Signed)
Per SN---ok to call in zpak #1  Take as directed with no refills. Called and spoke with pt and he is aware of zpak sent to his pharmacy

## 2011-09-25 NOTE — Telephone Encounter (Signed)
Pt c/o clear sinus drainage x 2 days, sore throat and slight chest tightness. He denies any sob, wheezing, cough or fever. He has taken OTC mucinex, claritin, saline nasal spray and gargled with salt water. Symptoms are not improving. Pls advise. Allergies  Allergen Reactions  . Prednisone     REACTION: high dose intolerance

## 2011-09-29 DIAGNOSIS — J309 Allergic rhinitis, unspecified: Secondary | ICD-10-CM | POA: Diagnosis not present

## 2011-10-05 ENCOUNTER — Telehealth: Payer: Self-pay | Admitting: Pulmonary Disease

## 2011-10-05 NOTE — Telephone Encounter (Signed)
Per Leigh-instructions are One puff twice a day - Inhalation; pharmacy is aware. Updated in chart as well.

## 2011-10-16 DIAGNOSIS — J309 Allergic rhinitis, unspecified: Secondary | ICD-10-CM | POA: Diagnosis not present

## 2011-11-13 DIAGNOSIS — J309 Allergic rhinitis, unspecified: Secondary | ICD-10-CM | POA: Diagnosis not present

## 2011-11-29 ENCOUNTER — Emergency Department (INDEPENDENT_AMBULATORY_CARE_PROVIDER_SITE_OTHER): Payer: Medicare Other

## 2011-11-29 ENCOUNTER — Other Ambulatory Visit: Payer: Self-pay

## 2011-11-29 ENCOUNTER — Emergency Department (HOSPITAL_BASED_OUTPATIENT_CLINIC_OR_DEPARTMENT_OTHER)
Admission: EM | Admit: 2011-11-29 | Discharge: 2011-11-29 | Disposition: A | Discharge status: Home / Self Care | Payer: Medicare Other | Attending: Emergency Medicine | Admitting: Emergency Medicine

## 2011-11-29 ENCOUNTER — Encounter (HOSPITAL_BASED_OUTPATIENT_CLINIC_OR_DEPARTMENT_OTHER): Payer: Self-pay | Admitting: *Deleted

## 2011-11-29 DIAGNOSIS — R0602 Shortness of breath: Secondary | ICD-10-CM | POA: Insufficient documentation

## 2011-11-29 DIAGNOSIS — E119 Type 2 diabetes mellitus without complications: Secondary | ICD-10-CM | POA: Diagnosis not present

## 2011-11-29 DIAGNOSIS — Z79899 Other long term (current) drug therapy: Secondary | ICD-10-CM | POA: Diagnosis not present

## 2011-11-29 DIAGNOSIS — R079 Chest pain, unspecified: Secondary | ICD-10-CM | POA: Diagnosis not present

## 2011-11-29 DIAGNOSIS — R0789 Other chest pain: Secondary | ICD-10-CM

## 2011-11-29 DIAGNOSIS — I10 Essential (primary) hypertension: Secondary | ICD-10-CM | POA: Insufficient documentation

## 2011-11-29 DIAGNOSIS — I2510 Atherosclerotic heart disease of native coronary artery without angina pectoris: Secondary | ICD-10-CM | POA: Diagnosis not present

## 2011-11-29 DIAGNOSIS — R51 Headache: Secondary | ICD-10-CM | POA: Insufficient documentation

## 2011-11-29 LAB — COMPREHENSIVE METABOLIC PANEL
AST: 25 U/L (ref 0–37)
Albumin: 3.8 g/dL (ref 3.5–5.2)
Alkaline Phosphatase: 93 U/L (ref 39–117)
BUN: 16 mg/dL (ref 6–23)
CO2: 21 mEq/L (ref 19–32)
Chloride: 106 mEq/L (ref 96–112)
Creatinine, Ser: 0.7 mg/dL (ref 0.50–1.35)
GFR calc non Af Amer: >90 mL/min (ref >90)
Potassium: 4.3 mEq/L (ref 3.5–5.1)
Total Bilirubin: 0.2 mg/dL — ABNORMAL LOW (ref 0.3–1.2)

## 2011-11-29 LAB — DIFFERENTIAL
Basophils Relative: 1 % (ref 0–1)
Lymphocytes Relative: 34 % (ref 12–46)
Monocytes Absolute: 0.4 10*3/uL (ref 0.1–1.0)
Monocytes Relative: 7 % (ref 3–12)
Neutro Abs: 3.4 10*3/uL (ref 1.7–7.7)
Neutrophils Relative %: 56 % (ref 43–77)

## 2011-11-29 LAB — CBC
HCT: 38.6 % — ABNORMAL LOW (ref 39.0–52.0)
Hemoglobin: 13.1 g/dL (ref 13.0–17.0)
RBC: 4.4 MIL/uL (ref 4.22–5.81)
WBC: 6.1 10*3/uL (ref 4.0–10.5)

## 2011-11-29 LAB — GLUCOSE, CAPILLARY: Glucose-Capillary: 233 mg/dL — ABNORMAL HIGH (ref 70–99)

## 2011-11-29 LAB — CARDIAC PANEL(CRET KIN+CKTOT+MB+TROPI)
Relative Index: 2.6 — ABNORMAL HIGH (ref 0.0–2.5)
Troponin I: <0.3 ng/mL (ref <0.30)

## 2011-11-29 MED ORDER — ASPIRIN 81 MG PO CHEW
324.0000 mg | CHEWABLE_TABLET | Freq: Once | ORAL | Status: AC
  Administered 2011-11-29: 324 mg via ORAL
  Filled 2011-11-29: qty 4

## 2011-11-29 MED ORDER — MORPHINE SULFATE 4 MG/ML IJ SOLN: 8.0000 mg | INTRAMUSCULAR | Status: DC | PRN

## 2011-11-29 MED ORDER — PANTOPRAZOLE SODIUM 40 MG PO TBEC
40.0000 mg | DELAYED_RELEASE_TABLET | Freq: Once | ORAL | Status: AC
  Administered 2011-11-29: 40 mg via ORAL
  Filled 2011-11-29: qty 1

## 2011-11-29 MED ORDER — MORPHINE SULFATE 4 MG/ML IJ SOLN: 4.0000 mg | Freq: Once | INTRAMUSCULAR | Status: DC

## 2011-11-29 NOTE — ED Provider Notes (Signed)
History     CSN: [id number]  Arrival date & time 11/29/11  2000   First MD Initiated Contact with Patient 11/29/11 2020      Chief Complaint  Patient presents with  . Chest Pain    (Consider location/radiation/quality/duration/timing/severity/associated sxs/prior treatment) HPI  Patient is 77 yo man with PMH of DM-II, HTN, COPD and HLD, who present with chest pain. Per patient, he woke up in 1:30 AM with bilateral arm tingling sensation, but no weakness in all extremities. At the same time, he started having chest pain. It is located at lower mid chest and also at epigastric area. It is pressure-like, non-radiating, 4/10 in severity. The pain is intermittent. It happens almost every hour and lasts for about 5 to 10 min. It is associated with SOB and palpitation at the beginning. Then SOB and palpitation resolved spontaneously. The chest pain is not affected by deep breath or coughing. Patient reports that he did treadmill for 20 min with heart rate up to 177 at home in this afternoon. After exercise, his chest pain was little better. Patient has GERD, but he thinks this chest pan is different.   Patient has chronic headache, which is worse today. No visional change or slurry speech.  Denies fever, chills, cough, abdominal pain,diarrhea, leg swelling. No pain in calf areas. No recent long distant traveling history.     Past Medical History  Diagnosis Date  . Hearing loss   . Allergic rhinitis, cause unspecified   . Obstructive sleep apnea (adult) (pediatric)   . COPD (chronic obstructive pulmonary disease)   . Unspecified essential hypertension   . Mixed hyperlipidemia   . Coronary artery disease     Coronary calcification with negative ETT 2010  . Type II or unspecified type diabetes mellitus without mention of complication, not stated as uncontrolled   . Esophageal reflux   . Diverticulosis of colon (without mention of hemorrhage)   . Irritable bowel syndrome   . Benign  neoplasm of colon   . Renal calculus   . BPH (benign prostatic hypertrophy)   . Degenerative joint disease   . Lumbar back pain   . Anxiety   . Anemia     Past Surgical History  Procedure Date  . Cholecystectomy   . Cataract extraction   . Skin cancer excision 3/10    left hand   . Knee surgery 11/10    Family History  Problem Relation Age of Onset  . Heart disease Father 84  . Hypertension Brother   . Stroke Mother 45  . Diabetes Mother   . Colon cancer Neg Hx     History  Substance Use Topics  . Smoking status: Former Smoker -- 1.0 packs/day for 25 years    Types: Cigarettes    Quit date: 09/15/1979  . Smokeless tobacco: Former Neurosurgeon    Quit date: 09/15/1979  . Alcohol Use: No    Review of Systems  Constitutional: Negative for fever, chills and fatigue.  HENT: Negative for ear pain, congestion, sore throat, facial swelling, sneezing, neck pain and voice change.   Eyes: Negative for photophobia, pain, discharge and visual disturbance.  Respiratory: Positive for chest tightness and shortness of breath. Negative for apnea, cough, choking, wheezing and stridor.   Cardiovascular: Positive for chest pain and palpitations. Negative for leg swelling.  Gastrointestinal: Negative for nausea, vomiting, abdominal pain, diarrhea, blood in stool and abdominal distention.  Genitourinary: Negative for dysuria, urgency, frequency and hematuria.  Musculoskeletal: Negative for joint  swelling, arthralgias and gait problem.  Skin: Negative for pallor and wound.  Neurological: Positive for headaches. Negative for dizziness, tremors, seizures, syncope, facial asymmetry, speech difficulty, weakness and light-headedness.  Hematological: Negative for adenopathy. Does not bruise/bleed easily.  Psychiatric/Behavioral: Negative for hallucinations, behavioral problems, confusion and agitation.    Date: 11/29/2011  Rate: 75  Rhythm: normal sinus rhythm  QRS Axis: normal  Intervals: normal   ST/T Wave abnormalities: normal  Conduction Disutrbances:none  Narrative Interpretation:   Old EKG Reviewed: unchanged   Allergies  Prednisone  Home Medications   Current Outpatient Rx  Name Route Sig Dispense Refill  . ASPIRIN 81 MG PO TABS Oral Take 81 mg by mouth daily.      [name] VITAMIN D 2000 UNITS PO CAPS Oral Take 1 capsule by mouth daily.      [name] FLUTICASONE PROPIONATE 50 MCG/ACT NA SUSP Nasal Place 2 sprays into the nose daily. As needed 16 g 11  . FLUTICASONE-SALMETEROL 115-21 MCG/ACT IN AERO Inhalation Inhale 1 puff into the lungs 2 (two) times daily.    . GUAIFENESIN ER 1200 MG PO TB12 Oral Take by mouth.      [name] LORATADINE 10 MG PO TBDP Oral Take 10 mg by mouth daily.      [name] METFORMIN HCL 500 MG PO TABS Oral Take 1 tablet (500 mg total) by mouth 2 (two) times daily with a meal. 60 tablet 11  . MULTI-VITAMIN/MINERALS PO TABS Oral Take 1 tablet by mouth daily.      [name] NIACIN ER (ANTIHYPERLIPIDEMIC) 500 MG PO TBCR Oral Take 1 tablet (500 mg total) by mouth at bedtime. 30 tablet 11  . PANTOPRAZOLE SODIUM 40 MG PO TBEC Oral Take 1 tablet (40 mg total) by mouth daily. 30 tablet 11  . SALINE NASAL SPRAY 0.65 % NA SOLN Nasal 1 spray by Nasal route as needed.      [name] TAMSULOSIN HCL 0.4 MG PO CAPS  TAKE ONE CAPSULE EVERY DAY 30 capsule 11  . VALSARTAN 160 MG PO TABS Oral Take 1 tablet (160 mg total) by mouth daily. 30 tablet 11  . HYOSCYAMINE SULFATE 0.125 MG SL SUBL Sublingual Place 0.125 mg under the tongue every 4 (four) hours as needed. As needed for abdominal cramping     . POLYETHYLENE GLYCOL 3350 PO POWD Oral Take 17 g by mouth 2 (two) times daily. Take as directed 1054 g 11  . PROCHLORPERAZINE 25 MG RE SUPP Rectal Place 1 suppository (25 mg total) rectally every 12 (twelve) hours as needed for nausea. 12 suppository 0  . SILDENAFIL CITRATE 100 MG PO TABS Oral Take 1 tablet (100 mg total) by mouth as needed for erectile dysfunction. 10 tablet 5  . TRIAMCINOLONE ACETONIDE 0.1 % EX  CREA Topical Apply topically 2 (two) times daily. As directed       BP 137/62  Pulse 74  Temp(Src) 97.6 F (36.4 C) (Oral)  Resp 13  Ht 6\' 3"  (1.905 m)  Wt 226 lb 4 oz (102.626 kg)  BMI 28.28 kg/m2  SpO2 96%  Physical Exam  General: resting in bed, not in acute distress HEENT: PERRL, EOMI, no scleral icterus Cardiac: S1/S2, RRR, No murmurs, gallops or rubs Pulm: Good air movement bilaterally, Clear to auscultation bilaterally, No rales, wheezing, rhonchi or rubs. Chest wall: there is no tenderness over chest wall upon palpation. Abd: Soft,  nondistended, nontender, no rebound pain, no organomegaly, BS present Ext: No rashes or edema, 2+DP/PT pulse bilaterally Neuro: alert  and oriented X3, cranial nerves II-XII grossly intact, muscle strength 5/5 in all extremeties,  sensation to light touch intact.   ED Course  Procedures (including critical care time)  Patient's chest pain is atypical. Though his EKG has no ischemic change, he has significant risk factors for ACS, including HTN, DM-II, HLD. It is important to rule out ACS. Other differential diagnosis includes, but less likely, PE (Low Well score, no tachycardia, O2 Sat is 100%, no leg pain or swelling);  PNA (no fever, cough, negative chest-Xray), aortic dissection (not radiating to the back, not typical tearing pain, no mediastinal widening on chest X-ray), GERD (patient is on PPI. He describes the pain is different with his GERD).   Patient had negative EKG, CXR, troponin I. No leukocytosis. He was treated with ASA and Protonix at ED. He feels better. He does not have chest pain now. His vitals are stable. Patient will be discharged home. He is instructed to follow up with his cardiologist in 2 days and with his PCP in 1 or 2 weeks.     Labs Reviewed  GLUCOSE, CAPILLARY - Abnormal; Notable for the following:    Glucose-Capillary 233 (*)    All other components within normal limits  CBC - Abnormal; Notable for the following:      HCT 38.6 (*)    All other components within normal limits  CARDIAC PANEL(CRET KIN+CKTOT+MB+TROPI)  DIFFERENTIAL  COMPREHENSIVE METABOLIC PANEL   Dg Chest Port 1 View  11/29/2011  *RADIOLOGY REPORT*  Clinical Data: Chest pain  PORTABLE CHEST - 1 VIEW  Comparison: 07/10/2011  Findings: The heart size and mediastinal contours are within normal limits.  Both lungs are clear.  The visualized skeletal structures are unremarkable.  IMPRESSION: Negative exam.  Original Report Authenticated By: Rosealee Albee, M.D.    No diagnosis found.    MDM      Lorretta Harp, MD 11/29/11 2250

## 2011-11-29 NOTE — Discharge Instructions (Signed)
  1. Please follow-up with your PCP in 1 or 2 weeks and your cardiologist in 2 days.  2. If you have worsening of your symptoms or new symptoms arise, please call your cardiologist or go to [facility] for immediate evaluation. Chest Pain (Nonspecific) Chest pain has many causes. Your pain could be caused by something serious, such as a heart attack or a blood clot in the lungs. It could also be caused by something less serious, such as a chest bruise or a virus. Follow up with your doctor. More lab tests or other studies may be needed to find the cause of your pain. Most of the time, nonspecific chest pain will improve within 2 to 3 days of rest and mild pain medicine. HOME CARE  For chest bruises, you may put ice on the sore area for 15 to 20 minutes, 3 to 4 times a day. Do this only if it makes you or your child feel better.   Put ice in a plastic bag.   Place a towel between the skin and the bag.   Rest for the next 2 to 3 days.   Go back to work if the pain improves.   See your doctor if the pain lasts longer than 1 to 2 weeks.   Only take medicine as told by your doctor.   Quit smoking if you smoke.  GET HELP RIGHT AWAY IF:   There is more pain or pain that spreads to the arm, neck, jaw, back, or belly (abdomen).   You or your child has shortness of breath.   You or your child coughs more than usual or coughs up blood.   You or your child has very bad back or belly pain, feels sick to his or her stomach (nauseous), or throws up (vomits).   You or your child has very bad weakness.   You or your child passes out (faints).   You or your child has a temperature by mouth above 102 F (38.9 C), not controlled by medicine.  Any of these problems may be serious and may be an emergency. Do not wait to see if the problems will go away. Get medical help right away. Call your local emergency services 911 in [place] Do not drive yourself to the hospital. MAKE SURE YOU:   Understand  these instructions.   Will watch this condition.   Will get help right away if you or your child is not doing well or gets worse.  Document Released: 02/17/2008 Document Revised: 08/20/2011 Document Reviewed: 02/17/2008 [facility] Patient Information 2012 [place], [place].

## 2011-11-29 NOTE — ED Notes (Signed)
Pt states he was awakened this am with tingling in both arms and epigastric pain. Describes as cramping. Some SHOB. Denies other s/s.

## 2011-11-29 NOTE — ED Notes (Signed)
MD at bedside. 

## 2011-11-29 NOTE — ED Notes (Signed)
Taken to room 10 for ekg  And triage- Corrie Dandy, charge RN aware

## 2011-11-30 ENCOUNTER — Encounter: Payer: Self-pay | Admitting: *Deleted

## 2011-11-30 NOTE — ED Provider Notes (Signed)
I saw and evaluated the patient, reviewed the resident's note and I agree with the findings and plan. Pt w hx gerd, gastroparesis, cad w epigastric area pain x approximately 18 hours, constant. Got on treadmil today and felt much better. Worse when lying flat. After constant symptoms x 18+ hours, ecg and cardiac markers normal. abd soft nt. Relief w gi meds.  Suzi Roots, MD 11/30/11 [phone]

## 2011-12-01 ENCOUNTER — Encounter: Payer: Self-pay | Admitting: Physician Assistant

## 2011-12-01 ENCOUNTER — Ambulatory Visit: Payer: Medicare Other | Admitting: Physician Assistant

## 2011-12-01 ENCOUNTER — Ambulatory Visit (INDEPENDENT_AMBULATORY_CARE_PROVIDER_SITE_OTHER): Payer: Medicare Other | Admitting: Physician Assistant

## 2011-12-01 DIAGNOSIS — I2510 Atherosclerotic heart disease of native coronary artery without angina pectoris: Secondary | ICD-10-CM

## 2011-12-01 DIAGNOSIS — I10 Essential (primary) hypertension: Secondary | ICD-10-CM | POA: Diagnosis not present

## 2011-12-01 DIAGNOSIS — E782 Mixed hyperlipidemia: Secondary | ICD-10-CM

## 2011-12-01 DIAGNOSIS — R079 Chest pain, unspecified: Secondary | ICD-10-CM

## 2011-12-01 MED ORDER — NITROGLYCERIN 0.4 MG SL SUBL: 0.4000 mg | SUBLINGUAL_TABLET | SUBLINGUAL | Status: DC | PRN

## 2011-12-01 NOTE — Patient Instructions (Signed)
Your physician has recommended you make the following change in your medication: A PRESCRIPTION HAS BEEN SENT IN TODAY FOR NITRO 0.4 SL  Your physician has requested that you have en exercise stress myoview DX 786.50 CHEST PAIN. For further information please visit [email or web address] Please follow instruction sheet, as given.  Your physician recommends that you schedule a follow-up appointment in: 3-4 WEEKS WITH DR. HOCHREIN

## 2011-12-01 NOTE — Progress Notes (Signed)
[address] [place], [place]  [address] Phone: [phone] Fax:  [phone]  Date:  12/01/2011   Name:  Jesse Santos       DOB:  08-23-1936 MRN:  [id number]  PCP:  Dr. Kriste Basque  Primary Cardiologist:  Dr. Rollene Rotunda  Primary Electrophysiologist:  None    History of Present Illness: Jesse Santos is a 76 y.o. male who returns for follow up after a visit to the ED.  He has a history of diabetes, hypertension, hyperlipidemia, sleep apnea, GERD, IBS, nephrolithiasis, COPD and anxiety.  A CT scan in the past has demonstrated coronary calcifications.  He is followed with Dr. Antoine Poche for blood pressure control.  Exercise treadmill test in 02/2009 and 12/2010 have been negative.  He was seen in the emergency room 11/29/11 with chest discomfort.  Labs: Potassium 4.3, creatinine 0.7, ALT 16, troponin negative x1, hemoglobin 13.1.  Chest x-ray was unremarkable.  He had chest tightness and pressure as well as bilateral arm tingling when he awoke Sunday morning.  He went back to sleep.  When he woke again, the symptoms were still there.  He may have been short of breath.  He thinks he may have been somewhat diaphoretic.  He denies jaw pain.  The pain would come and go throughout the day.  He walked on the treadmill for 20 minutes without increase or decrease in his symptoms.  He recorded his blood pressure at 160-170 systolic.  He went to the emergency room later that evening after having symptoms throughout the day.  As noted his troponin was negative.  The symptoms seemed to resolve after receiving aspirin and Protonix.  He felt fatigued since that day.  However, today he feels back to normal.  He denies significant dyspnea with exertion.  He describes class II symptoms.  He denies exertional chest pain.  He denies orthopnea, PND or edema.  Past Medical History  Diagnosis Date  . Hearing loss   . Allergic rhinitis, cause unspecified   . Obstructive sleep apnea (adult)  (pediatric)   . COPD (chronic obstructive pulmonary disease)   . Unspecified essential hypertension   . Mixed hyperlipidemia   . Coronary artery disease     Coronary calcification with negative ETT 2010  . Type II or unspecified type diabetes mellitus without mention of complication, not stated as uncontrolled   . Esophageal reflux   . Diverticulosis of colon (without mention of hemorrhage)   . Irritable bowel syndrome   . Benign neoplasm of colon   . Renal calculus   . BPH (benign prostatic hypertrophy)   . Degenerative joint disease   . Lumbar back pain   . Anxiety   . Anemia     Current Outpatient Prescriptions  Medication Sig Dispense Refill  . aspirin 81 MG tablet Take 81 mg by mouth daily.        . Cholecalciferol (VITAMIN D) 2000 UNITS CAPS Take 1 capsule by mouth daily.        . fluticasone (FLONASE) 50 MCG/ACT nasal spray Place 2 sprays into the nose daily. As needed  16 g  11  . fluticasone-salmeterol (ADVAIR HFA) 115-21 MCG/ACT inhaler Inhale 1 puff into the lungs 2 (two) times daily.      . Guaifenesin (MUCINEX MAXIMUM STRENGTH) 1200 MG TB12 Take by mouth.        . hyoscyamine (LEVSIN SL) 0.125 MG SL tablet Place 0.125 mg under the tongue every 4 (four) hours as  needed. As needed for abdominal cramping       . loratadine (CLARITIN REDITABS) 10 MG dissolvable tablet Take 10 mg by mouth daily.        . metFORMIN (GLUCOPHAGE) 500 MG tablet Take 1 tablet (500 mg total) by mouth 2 (two) times daily with a meal.  60 tablet  11  . Multiple Vitamins-Minerals (MULTIVITAMIN WITH MINERALS) tablet Take 1 tablet by mouth daily.        . niacin (NIASPAN) 500 MG CR tablet Take 1 tablet (500 mg total) by mouth at bedtime.  30 tablet  11  . pantoprazole (PROTONIX) 40 MG tablet Take 1 tablet (40 mg total) by mouth daily.  30 tablet  11  . polyethylene glycol powder (MIRALAX) powder Take 17 g by mouth 2 (two) times daily. Take as directed  1054 g  11  . prochlorperazine (COMPRO) 25 MG  suppository Place 1 suppository (25 mg total) rectally every 12 (twelve) hours as needed for nausea.  12 suppository  0  . sildenafil (VIAGRA) 100 MG tablet Take 1 tablet (100 mg total) by mouth as needed for erectile dysfunction.  10 tablet  5  . sodium chloride (OCEAN) 0.65 % nasal spray 1 spray by Nasal route as needed.        . Tamsulosin HCl (FLOMAX) 0.4 MG CAPS TAKE ONE CAPSULE EVERY DAY  30 capsule  11  . triamcinolone (KENALOG) 0.1 % cream Apply topically 2 (two) times daily. As directed       . valsartan (DIOVAN) 160 MG tablet Take 1 tablet (160 mg total) by mouth daily.  30 tablet  11   Current Facility-Administered Medications  Medication Dose Route Frequency Provider Last Rate Last Dose  . levalbuterol (XOPENEX) nebulizer solution 0.63 mg  0.63 mg Nebulization Once Julio Sicks, NP        Allergies: Allergies  Allergen Reactions  . Prednisone     REACTION: high dose intolerance    History  Substance Use Topics  . Smoking status: Former Smoker -- 1.0 packs/day for 25 years    Types: Cigarettes    Quit date: 09/15/1979  . Smokeless tobacco: Former Neurosurgeon    Quit date: 09/15/1979  . Alcohol Use: No     ROS:  Please see the history of present illness.   He has a headache that has been present since he started on blood pressure medicines.  He has urinary frequency that he relates to BPH.  He denies chest pain related to meals.  He denies dysphagia, belching, waterbrash symptoms.  He denies melena, hematochezia, hematuria.  He denies fevers, chills, cough.  All other systems reviewed and negative.   PHYSICAL EXAM: VS:  BP 131/73  Pulse 83  Ht 6\' 2"  (1.88 m)  Wt 232 lb (105.235 kg)  BMI 29.79 kg/m2 Well nourished, well developed, in no acute distress HEENT: normal Neck: no JVD Cardiac:  normal S1, S2; RRR; no murmur Lungs:  clear to auscultation bilaterally, no wheezing, rhonchi or rales Abd: soft, nontender, no hepatomegaly Ext: no edema Skin: warm and dry Neuro:   CNs 2-12 intact, no focal abnormalities noted  EKG:  Sinus rhythm, rate 76, normal axis  ASSESSMENT AND PLAN:  1. Chest pain  He has atypical and typical features.  He has coronary calcification noted on CT in the past.  He has multiple cardiac risk factors (DM2, HTN, HLP, prior smoker).  However, he has been able to walk on the treadmill at home without  chest symptoms.  His Troponin in the ED was negative and his CXR was ok.  Given the concerning nature of his symptoms and his risk factors, I think he warrants further testing.  Schedule ETT-Myoview.  If abnormal he will need cardiac cath.  Follow up with Dr. Rollene Rotunda in 3-4 weeks, or sooner if myoview abnormal.     2. CORONARY ATHEROSCLEROSIS NATIVE CORONARY ARTERY  Continue ASA.  Arrange stress test as noted.   3. HYPERTENSION  Controlled.  Continue current therapy.    4. Mixed hyperlipidemia  Managed by PCP.      Signed, Tereso Newcomer, PA-C  10:22 AM 12/01/2011

## 2011-12-08 ENCOUNTER — Ambulatory Visit (HOSPITAL_COMMUNITY): Payer: Medicare Other | Attending: Cardiology | Admitting: Radiology

## 2011-12-08 VITALS — BP 143/75 | Ht 72.0 in | Wt 228.0 lb

## 2011-12-08 DIAGNOSIS — R5381 Other malaise: Secondary | ICD-10-CM | POA: Diagnosis not present

## 2011-12-08 DIAGNOSIS — R079 Chest pain, unspecified: Secondary | ICD-10-CM | POA: Diagnosis not present

## 2011-12-08 DIAGNOSIS — J449 Chronic obstructive pulmonary disease, unspecified: Secondary | ICD-10-CM | POA: Diagnosis not present

## 2011-12-08 DIAGNOSIS — R000 Tachycardia, unspecified: Secondary | ICD-10-CM | POA: Insufficient documentation

## 2011-12-08 DIAGNOSIS — Z87891 Personal history of nicotine dependence: Secondary | ICD-10-CM | POA: Insufficient documentation

## 2011-12-08 DIAGNOSIS — R0789 Other chest pain: Secondary | ICD-10-CM | POA: Insufficient documentation

## 2011-12-08 DIAGNOSIS — E785 Hyperlipidemia, unspecified: Secondary | ICD-10-CM | POA: Diagnosis not present

## 2011-12-08 DIAGNOSIS — E782 Mixed hyperlipidemia: Secondary | ICD-10-CM

## 2011-12-08 DIAGNOSIS — E119 Type 2 diabetes mellitus without complications: Secondary | ICD-10-CM

## 2011-12-08 DIAGNOSIS — R002 Palpitations: Secondary | ICD-10-CM | POA: Insufficient documentation

## 2011-12-08 DIAGNOSIS — R0602 Shortness of breath: Secondary | ICD-10-CM | POA: Diagnosis not present

## 2011-12-08 DIAGNOSIS — R61 Generalized hyperhidrosis: Secondary | ICD-10-CM | POA: Diagnosis not present

## 2011-12-08 DIAGNOSIS — E663 Overweight: Secondary | ICD-10-CM | POA: Diagnosis not present

## 2011-12-08 DIAGNOSIS — I2510 Atherosclerotic heart disease of native coronary artery without angina pectoris: Secondary | ICD-10-CM

## 2011-12-08 DIAGNOSIS — I10 Essential (primary) hypertension: Secondary | ICD-10-CM

## 2011-12-08 DIAGNOSIS — R0609 Other forms of dyspnea: Secondary | ICD-10-CM | POA: Insufficient documentation

## 2011-12-08 DIAGNOSIS — J4489 Other specified chronic obstructive pulmonary disease: Secondary | ICD-10-CM | POA: Insufficient documentation

## 2011-12-08 DIAGNOSIS — I4949 Other premature depolarization: Secondary | ICD-10-CM

## 2011-12-08 DIAGNOSIS — R0989 Other specified symptoms and signs involving the circulatory and respiratory systems: Secondary | ICD-10-CM | POA: Insufficient documentation

## 2011-12-08 MED ORDER — TECHNETIUM TC 99M TETROFOSMIN IV KIT
10.9000 | PACK | Freq: Once | INTRAVENOUS | Status: AC | PRN
  Administered 2011-12-08: 10.9 via INTRAVENOUS

## 2011-12-08 MED ORDER — TECHNETIUM TC 99M TETROFOSMIN IV KIT
33.0000 | PACK | Freq: Once | INTRAVENOUS | Status: AC | PRN
  Administered 2011-12-08: 33 via INTRAVENOUS

## 2011-12-08 NOTE — Progress Notes (Signed)
[facility] SITE 3 NUCLEAR MED [address] [place] [address] [phone]  Cardiology Nuclear Med Study  Jesse Santos is a 76 y.o. male     MRN : [id number]     DOB: 06-11-1936  Procedure Date: 12/08/2011  Nuclear Med Background Indication for Stress Test:  Evaluation for Ischemia and 11/29/11 Post Hospital with Chest Pain, (-) enzymes History:  COPD and '02 MWU:[id number], EF=55%; '10 NU:[id number] calcifications; '12 QIH:[id number] Cardiac Risk Factors: History of Smoking, Hypertension, Lipids, NIDDM and Overweight  Symptoms:  Chest Pressure/Tightness with and without Exertion (last episode of chest discomfort was yesterday morning), Diaphoresis, DOE, Fatigue, Palpitations and Rapid HR   Nuclear Pre-Procedure Caffeine/Decaff Intake:  None NPO After: 9:30pm   Lungs:  Clear. IV 0.9% NS with Angio Cath:  22g  IV Site: R Wrist  IV Started by:  Stanton Kidney, EMT-P  Chest Size (in):  44 Cup Size: n/a  Height: 6' (1.829 m)  Weight:  228 lb (103.42 kg)  BMI:  Body mass index is 30.92 kg/(m^2). Tech Comments:  CBG= 158 @ 7:15 am, per patient.    Nuclear Med Study 1 or 2 day study: 1 day  Stress Test Type:  Stress  Reading MD: Olga Millers, MD  Order Authorizing Provider:  Rollene Rotunda, MD; Tereso Newcomer, PA-C  Resting Radionuclide: Technetium 52m Tetrofosmin  Resting Radionuclide Dose: 10.9 mCi   Stress Radionuclide:  Technetium 3m Tetrofosmin  Stress Radionuclide Dose: 33.0 mCi           Stress Protocol Rest HR: 65 Stress HR: 139  Rest BP: 143/75 Stress BP: 210/66  Exercise Time (min): 6:00 METS: 7.0   Predicted Max HR: 146 bpm % Max HR: 95.21 bpm Rate Pressure Product: 38756   Dose of Adenosine (mg):  n/a Dose of Lexiscan: n/a mg  Dose of Atropine (mg): n/a Dose of Dobutamine: n/a mcg/kg/min (at max HR)  Stress Test Technologist: Smiley Houseman, CMA-N  Nuclear Technologist:  Doyne Keel, CNMT     Rest Procedure:  Myocardial perfusion imaging was  performed at rest 45 minutes following the intravenous administration of Technetium 58m Tetrofosmin.  Rest ECG: No acute changes  Stress Procedure:  The patient exercised on the treadmill utilizing the  Bruce protocol for six minutes. He then stopped due to fatigue.  He c/o chest pressure, 4/10, with exercise.  There were no diagnostic ST-T wave changes, occasional PAC's and PVC's with rare couplets were noted.   He had a hypertensive response to exercise, 210/66.  Technetium 103m Tetrofosmin was injected at peak exercise and myocardial perfusion imaging was performed after a brief delay.  Stress ECG: No significant ST segment change suggestive of ischemia.  QPS Raw Data Images:  Acquisition technically good; normal left ventricular size. Stress Images:  Normal homogeneous uptake in all areas of the myocardium. Rest Images:  There is decreased uptake in the inferior wall. Subtraction (SDS):  No evidence of ischemia. Transient Ischemic Dilatation (Normal <1.22):  1.01 Lung/Heart Ratio (Normal <0.45):  0.26  Quantitative Gated Spect Images QGS EDV:  98 ml QGS ESV:  28 ml  Impression Exercise Capacity:  Fair exercise capacity. BP Response:  Hypertensive blood pressure response. Clinical Symptoms:  There is chest pain. ECG Impression:  No significant ST segment change suggestive of ischemia. Comparison with Prior Nuclear Study: No images to compare  Overall Impression:  Normal stress nuclear study.  LV Ejection Fraction: 71%.  LV Wall Motion:  NL LV Function; NL Wall Motion  Olga Millers

## 2011-12-15 DIAGNOSIS — J309 Allergic rhinitis, unspecified: Secondary | ICD-10-CM | POA: Diagnosis not present

## 2011-12-31 ENCOUNTER — Ambulatory Visit (INDEPENDENT_AMBULATORY_CARE_PROVIDER_SITE_OTHER): Payer: Medicare Other | Admitting: Cardiology

## 2011-12-31 ENCOUNTER — Encounter: Payer: Self-pay | Admitting: Cardiology

## 2011-12-31 VITALS — BP 150/80 | HR 80 | Ht 74.0 in | Wt 230.8 lb

## 2011-12-31 DIAGNOSIS — I10 Essential (primary) hypertension: Secondary | ICD-10-CM | POA: Diagnosis not present

## 2011-12-31 DIAGNOSIS — E663 Overweight: Secondary | ICD-10-CM

## 2011-12-31 DIAGNOSIS — I2510 Atherosclerotic heart disease of native coronary artery without angina pectoris: Secondary | ICD-10-CM

## 2011-12-31 NOTE — Assessment & Plan Note (Signed)
The patient has no new sypmtoms.  No further cardiovascular testing is indicated.  We will continue with aggressive risk reduction and meds as listed.  

## 2011-12-31 NOTE — Progress Notes (Signed)
HPI The patient was in the emergency room recently for evaluation of chest discomfort. He was then seen in this office and was sent for a stress perfusion study. This demonstrated no evidence of ischemia or infarct. His EF was 71%. When he was in the emergency room he was describing persistent chest discomfort. However, he says he is no longer getting this. He does have some fleeting chest discomfort occasionally. However, he walks 30 minutes daily without bringing this on. The patient denies any new symptoms such as chest discomfort, neck or arm discomfort. There has been no new shortness of breath, PND or orthopnea. There have been no reported palpitations, presyncope or syncope.  Allergies  Allergen Reactions  . Prednisone     REACTION: high dose intolerance    Current Outpatient Prescriptions  Medication Sig Dispense Refill  . aspirin 81 MG tablet Take 81 mg by mouth daily.        . Cholecalciferol (VITAMIN D) 2000 UNITS CAPS Take 1 capsule by mouth daily.        . fluticasone-salmeterol (ADVAIR HFA) 115-21 MCG/ACT inhaler Inhale 1 puff into the lungs 2 (two) times daily.      . Guaifenesin (MUCINEX MAXIMUM STRENGTH) 1200 MG TB12 Take by mouth.        . hyoscyamine (LEVSIN SL) 0.125 MG SL tablet Place 0.125 mg under the tongue every 4 (four) hours as needed. As needed for abdominal cramping       . metFORMIN (GLUCOPHAGE) 500 MG tablet Take 1 tablet (500 mg total) by mouth 2 (two) times daily with a meal.  60 tablet  11  . Multiple Vitamins-Minerals (MULTIVITAMIN WITH MINERALS) tablet Take 1 tablet by mouth daily.        . niacin (NIASPAN) 500 MG CR tablet Take 1 tablet (500 mg total) by mouth at bedtime.  30 tablet  11  . nitroGLYCERIN (NITROSTAT) 0.4 MG SL tablet Place 1 tablet (0.4 mg total) under the tongue every 5 (five) minutes as needed for chest pain.  25 tablet  3  . NON FORMULARY Inject as directed every 30 (thirty) days. Allergy shot      . pantoprazole (PROTONIX) 40 MG tablet  Take 1 tablet (40 mg total) by mouth daily.  30 tablet  11  . polyethylene glycol powder (MIRALAX) powder Take 17 g by mouth 2 (two) times daily. Take as directed  1054 g  11  . prochlorperazine (COMPRO) 25 MG suppository Place 1 suppository (25 mg total) rectally every 12 (twelve) hours as needed for nausea.  12 suppository  0  . sildenafil (VIAGRA) 100 MG tablet Take 1 tablet (100 mg total) by mouth as needed for erectile dysfunction.  10 tablet  5  . sodium chloride (OCEAN) 0.65 % nasal spray 1 spray by Nasal route as needed.        . Tamsulosin HCl (FLOMAX) 0.4 MG CAPS TAKE ONE CAPSULE EVERY DAY  30 capsule  11  . triamcinolone (KENALOG) 0.1 % cream Apply topically 2 (two) times daily. As directed       . valsartan (DIOVAN) 160 MG tablet Take 1 tablet (160 mg total) by mouth daily.  30 tablet  11  . sildenafil (VIAGRA) 50 MG tablet Take 1 tablet (50 mg total) by mouth daily as needed for erectile dysfunction.  10 tablet  2   Current Facility-Administered Medications  Medication Dose Route Frequency Provider Last Rate Last Dose  . levalbuterol (XOPENEX) nebulizer solution 0.63 mg  0.63  mg Nebulization Once Julio Sicks, NP        Past Medical History  Diagnosis Date  . Hearing loss   . Allergic rhinitis, cause unspecified   . Obstructive sleep apnea (adult) (pediatric)   . COPD (chronic obstructive pulmonary disease)   . Unspecified essential hypertension   . Mixed hyperlipidemia   . Coronary artery disease     Coronary calcification with negative ETT 2010  . Type II or unspecified type diabetes mellitus without mention of complication, not stated as uncontrolled   . Esophageal reflux   . Diverticulosis of colon (without mention of hemorrhage)   . Irritable bowel syndrome   . Benign neoplasm of colon   . Renal calculus   . BPH (benign prostatic hypertrophy)   . Degenerative joint disease   . Lumbar back pain   . Anxiety   . Anemia     Past Surgical History  Procedure Date   . Cholecystectomy   . Cataract extraction   . Skin cancer excision 3/10    left hand   . Knee surgery 11/10    ROS:  As stated in the HPI and negative for all other systems.  PHYSICAL EXAM BP 150/80  Pulse 80  Ht 6\' 2"  (1.88 m)  Wt 230 lb 12.8 oz (104.69 kg)  BMI 29.63 kg/m2 GENERAL:  Well appearing HEENT:  Pupils equal round and reactive, fundi not visualized, oral mucosa unremarkable NECK:  No jugular venous distention, waveform within normal limits, carotid upstroke brisk and symmetric, no bruits, no thyromegaly LYMPHATICS:  No cervical, inguinal adenopathy LUNGS:  Clear to auscultation bilaterally BACK:  No CVA tenderness CHEST:  Unremarkable HEART:  PMI not displaced or sustained,S1 and S2 within normal limits, no S3, no S4, no clicks, no rubs, no murmurs ABD:  Flat, positive bowel sounds normal in frequency in pitch, no bruits, no rebound, no guarding, no midline pulsatile mass, no hepatomegaly, no splenomegaly EXT:  2 plus pulses throughout, no edema, no cyanosis no clubbing   ASSESSMENT AND PLAN

## 2011-12-31 NOTE — Assessment & Plan Note (Signed)
His blood pressure is slightly elevated here but it is not typically high and I reviewed her blood pressure diary. No change in therapy is indicated.

## 2011-12-31 NOTE — Assessment & Plan Note (Signed)
He understands the need to lose weight with diet and exercise. 

## 2011-12-31 NOTE — Patient Instructions (Signed)
Continue current medications as listed.  Follow up in 1 year with Dr Hochrein.  You will receive a letter in the mail 2 months before you are due.  Please call us when you receive this letter to schedule your follow up appointment.  

## 2012-01-14 ENCOUNTER — Other Ambulatory Visit: Payer: Self-pay | Admitting: Pulmonary Disease

## 2012-01-14 DIAGNOSIS — J309 Allergic rhinitis, unspecified: Secondary | ICD-10-CM | POA: Diagnosis not present

## 2012-01-25 DIAGNOSIS — S62319A Displaced fracture of base of unspecified metacarpal bone, initial encounter for closed fracture: Secondary | ICD-10-CM | POA: Diagnosis not present

## 2012-01-28 DIAGNOSIS — Z961 Presence of intraocular lens: Secondary | ICD-10-CM | POA: Diagnosis not present

## 2012-01-28 DIAGNOSIS — E119 Type 2 diabetes mellitus without complications: Secondary | ICD-10-CM | POA: Diagnosis not present

## 2012-02-05 ENCOUNTER — Encounter: Payer: Self-pay | Admitting: Pulmonary Disease

## 2012-02-11 ENCOUNTER — Other Ambulatory Visit (INDEPENDENT_AMBULATORY_CARE_PROVIDER_SITE_OTHER): Payer: Medicare Other

## 2012-02-11 ENCOUNTER — Encounter: Payer: Self-pay | Admitting: Pulmonary Disease

## 2012-02-11 ENCOUNTER — Ambulatory Visit (INDEPENDENT_AMBULATORY_CARE_PROVIDER_SITE_OTHER): Payer: Medicare Other | Admitting: Pulmonary Disease

## 2012-02-11 VITALS — BP 128/66 | HR 84 | Temp 96.7°F | Ht 74.0 in | Wt 228.4 lb

## 2012-02-11 DIAGNOSIS — J4489 Other specified chronic obstructive pulmonary disease: Secondary | ICD-10-CM

## 2012-02-11 DIAGNOSIS — E119 Type 2 diabetes mellitus without complications: Secondary | ICD-10-CM | POA: Diagnosis not present

## 2012-02-11 DIAGNOSIS — E782 Mixed hyperlipidemia: Secondary | ICD-10-CM

## 2012-02-11 DIAGNOSIS — K5730 Diverticulosis of large intestine without perforation or abscess without bleeding: Secondary | ICD-10-CM

## 2012-02-11 DIAGNOSIS — Z87898 Personal history of other specified conditions: Secondary | ICD-10-CM

## 2012-02-11 DIAGNOSIS — J449 Chronic obstructive pulmonary disease, unspecified: Secondary | ICD-10-CM

## 2012-02-11 DIAGNOSIS — I10 Essential (primary) hypertension: Secondary | ICD-10-CM

## 2012-02-11 DIAGNOSIS — K5900 Constipation, unspecified: Secondary | ICD-10-CM

## 2012-02-11 DIAGNOSIS — G4733 Obstructive sleep apnea (adult) (pediatric): Secondary | ICD-10-CM

## 2012-02-11 DIAGNOSIS — K219 Gastro-esophageal reflux disease without esophagitis: Secondary | ICD-10-CM

## 2012-02-11 DIAGNOSIS — F411 Generalized anxiety disorder: Secondary | ICD-10-CM

## 2012-02-11 DIAGNOSIS — M1990 Unspecified osteoarthritis, unspecified site: Secondary | ICD-10-CM

## 2012-02-11 LAB — BASIC METABOLIC PANEL
BUN: 17 mg/dL (ref 6–23)
Calcium: 9.3 mg/dL (ref 8.4–10.5)
GFR: 100.32 mL/min (ref >60.00)
Glucose, Bld: 132 mg/dL — ABNORMAL HIGH (ref 70–99)
Potassium: 5 mEq/L (ref 3.5–5.1)
Sodium: 140 mEq/L (ref 135–145)

## 2012-02-11 LAB — HEMOGLOBIN A1C: Hgb A1c MFr Bld: 7.1 % — ABNORMAL HIGH (ref 4.6–6.5)

## 2012-02-11 LAB — LIPID PANEL: Total CHOL/HDL Ratio: 3

## 2012-02-11 LAB — TSH: TSH: 1.42 u[IU]/mL (ref 0.35–5.50)

## 2012-02-11 MED ORDER — LOSARTAN POTASSIUM 100 MG PO TABS: 100.0000 mg | ORAL_TABLET | Freq: Every day | ORAL | Status: DC

## 2012-02-11 MED ORDER — TADALAFIL 20 MG PO TABS: 20.0000 mg | ORAL_TABLET | Freq: Every day | ORAL | Status: DC | PRN

## 2012-02-11 NOTE — Patient Instructions (Signed)
Today we updated your med list in our EPIC system...    Continue your current medications the same...  We decided to change the Diovan to generic LOSARTAN 100mg - take one tab daily...    Watch your BP at home & call for any problems...  Today we did your follow up FASTING blood work...    We will call you w/ the results when avail...  Stay as active as possible (but watch out for DOGS)  Let's plan a follow up visit in 6 months.Marland KitchenMarland Kitchen

## 2012-02-11 NOTE — Progress Notes (Signed)
Subjective:    Patient ID: Jesse Santos, male    DOB: 10-05-1935, 76 y.o.   MRN: [id number]  HPI 76 y/o WM w/ mult med problems here for a follow up visit>   ~  July 10, 2010:  mult somatic complaints- fell 10/1 w/ left knee injury & cortisone shot helped, c/o head congestion, CWP, sugars sl up, HA, etc... on a pos note- he stopped Vit E & urine flow is much better he says...  breathing stable- no ch in DOE;  BP controlled w/ diet, no salt;  Lipids OK on Niaspan;  DM control adeq on Metform;  refills written today.  ~  January 02, 2011:  54mo ROV & he notes lots of stress from dental problems etc; notes his BP has been up & he saw DrHochrein w/ Norvasc prescribed but he states this "stopped my urine flow- same thing happened to a guy at church"; now on Diovan 320mg /d & BP is improved> EKG was normal, he has a Myoview sched soon for screening eval...  Lipids remain adeq controlled on his Niacin;  DM control fair on his Metformin monotherapy (A1c=7.3 & may need additional medication) but he hasn't yet lost any wt & we reviewe3d diet & exercise recommendations;  He is c/o some dysphagia w/ "food stopping on occas"> takes Protonix, Miralax, Levsin- and we discussed f/u eval from DrPerry to check his esoph;  LABS today showed elev PSA 1.0==>4.9 over the last yr (we called in Doxy x14d w/ repeat PSA 75mo & refer to Urology if not back to norm)...  ~  Feb 05, 2011:  75mo ROV & BP remains well controlled on the Diovan320mg /d, he claims that Norvasc and HCTZ "both stopped my urine flow";  He had elev PSA at 4.9 & was treated w/ Doxy x10d> repeat PSA back to normal= 1.30;  He is sched for colonoscopy next wk w/ DrPerry... We reviewed his labs.  ~  August 14, 2011:  54mo ROV & he saw TP ~64mo ago w/ URI/ bronchitis, treated w/ Augmentin, Mucinex, Tylenol & improved;  His COPD is controlled w/ Advair, Proair, Mucinex & we reviewed regular dosing;  BP controlled on Diovan but he refuses the 320 dose saying it  caused HA & he knows that it can elev the BS too; he decr to 1/2 the dose & says that his BP is ok on this dose; he denies CP, palpit, dizzy, SOB, edema...  See prob list below>>  ~  Feb 11, 2012:  54mo ROV & Gala Romney reports a broken bone in right hand from playing w/ the dog- in short arm cast now (DrDuda);  He is in the donut hole & requesting any med changes to generics & we reviewed this esp as it relates to very low cash pay meds;  We decided to change Niaspan to OTC slow release Niacin, and change Diovan160 to Losartan100 & he will follow BP at home after the transition...    He saw DrHochrein 4/13 after being in the ER 11/29/11 for AtypCP> Myoview was neg- no ischemia or infarct, EF=71%; he has been walking daily & no CP, recommended to stay active & get weight down...    He had recent f/u DrShapiro for Ophthalmology> s/p cats, no DM retinopathy seen...    We reviewed prob list, meds, xrays and labs> see below>> CXR 3/13 showed normal heart size, clear lungs, NAD.Marland KitchenMarland Kitchen EKG 3/13 showed NSR, rate75, WNL, NAD... LABS 3/13:  Chems- ok x BS=232;  CBC-  wnl.Marland KitchenMarland Kitchen LABS 5/13:  FLP- at goals on Niacin alone;  Chems- ok x BS=132 A1c=7.1 on MetformBid;  TSH=1.42;  PSA=1.04...        Problem List:  HEARING LOSS (ICD-389.9) - he has bilat hearing aides...  ALLERGIC RHINITIS (ICD-477.9) - on allergy shots from DrESL Laney Pastor)... plus Claritin, Saline, Mucinex, Flonase. ~  occas bouts of sinusitis requiring antibiotic Rx...  OBSTRUCTIVE SLEEP APNEA (ICD-327.23) - sleep study 8/04 showed RDI= 45 & desat to 88%... mod snoring and leg jerks without sleep disruption... CPAP perscribed but not using it now- states he can't sleep w/ it on & furthermore he is resting well as long as he stays on his side... denies daytime hypersomnolence & not interested in re-evaluation.  COPD (ICD-496) - ex-smoker, quit 1981... on ADVAIR 100Bid, PROAIR Prn, MUCINEX Bid... he had Pneumovax in 2008... doesn't like Pred Rx...  ~  10/11:   doing well except recent cough, min green sputum (no change in dyspnea)- we will Rx w/ Augmentin, incr Mucinex/ Fluids. ~  4/12:  Breathing at baseline, continue maintenance meds...  HYPERTENSION (ICD-401.9) - prev on diet alone> then on Diovan320, but we decided to change to LOSARTAN 100mg /d 5/13 to save $$ ~  He claims that Norvasc & HCTZ "stopped my urine flow"... ~  10/11:  BP= 128/80 & even better at home he says... denies HA, fatigue, visual changes, CP, palipit, dizziness, syncope, dyspnea, edema, etc... ~  4/12:  Pt has seen TP, DrHochrein, & checking BP daily at home; tried on Norvasc but claims it decr his urine flow; now on Diovan & improved w/ BP= 132/70... ~  5/12:  BP remains well controlled on Diovan 320mg /d= 120/62 today. ~  11/12:  BP= 142/82 on Diovan160 now; denies CP, palpit, dizzy, SOB, edema... ~  5/13:  BP= 128/66 & he's requesting change to cheaper med; rec switch Diovan to LOSARTAN 100mg /d...  R/O CAD (ICD-414.00) - on ASA 81mg /d... Followed by DrHochrein & his notes are reviewed. ~  hx neg cardiac cath 1988 by DrJoe Morris... ~  NuclearStressTest 10/02 was norm- no scar or ischemia, EF=55%. ~  CT Abd 4/10 via ER showed coronary calcif... referred to Cards. ~  Eval by DrHochrein 6/10 showed norm EKG & cardiac exam... treadmill ordered. ~  Treadmill 6/10 showed reasonable exerc tolerance, no ischemic changes, few PVCs, sl incr BP... ~  Follow up Myoview scheduled 4/12 ==> pending  MIXED HYPERLIPIDEMIA (ICD-272.2) - low HDL and NIASPAN 500mg /d started 4/09... ~  FLP 4/09 showed TChol 160, TG 106, HDL 26, LDL 113... rec- diet + Niaspan 500/d. ~  FLP 10/09 showed TChol 155, TG 78, HDL 33, LDL 106... ~  FLP 4/10 showed TChol 133, TG 63, HDL 30, LDL 91... rec> continue same. ~  FLP 4/11 showed TChol 144, TG 61, HDL 38, LDL 94 ~  FLP 10/11 showed TChol 155, TG 81, HDL 40, LDL 99 ~  FLP 4/12 showed TChol 142, TG 69, HDL 38, LDL 90 ~  FLP 11/12 showed TChol 165, TG 82, HDL  43, LDL 106 ~  FLP 5/13 in Niasp500 showed TChol 155, TG 72, HDL 46, LDL 95... rec change to generic.  DM (ICD-250.00) - on diet + METFORMIN 500mg Bid... he reports BS at home all in the 120-130's... ~  labs 4/09 showed BS= 132, HgA1c= 6.6.Marland KitchenMarland Kitchen rec- same med, better diet... ~  labs 10/09 showed BS= 131, HgA1c= 6.3.Marland KitchenMarland Kitchen ~  labs 4/10 showed BS= 117, HgA1c= 6.2.[name]. rec> same meds/ diet Rx. ~  labs 10/10 showed BS= 138, A1c= 6.5 ~  labs 4/11 showed BS= 121, A1c= 6.5.Marland KitchenMarland Kitchen On Metform500Bid + diet. ~  Dilated eye exam 5/11 by DrShapiro- no retinopathy... ~  labs 10/11 (wt=228#) showed BS= 118, A1c= 7.1.[name]. not as good- get on diet or more meds. ~  Labs 4/12 (wt=229#) showed BS= 119, A1c= 7.3... Wrong direction, may need more meds, get wt down! ~  5/12:  Ophthalmology check by DrShapiro was neg- no retinopathy... ~  Labs 11/12 showed BS= 136, A1c= 7.0 ~  Labs 5/13 on MetformBid showed BS= 132, A1c= 7.1.Marland KitchenMarland Kitchen Continue same, get wt down.  GERD (ICD-530.81) & ? GASTROPARESIS - on PROTONIX 40mg /d, & off Reglan per DrPerry... ~   last EGD 6/07 by DrPerry showed gastic polyp... ~  8/10:  given Compazine suppos for gastroparesis flair by DrGessner. ~  4/12:  C/o intermittent dysphagia & referred back to GI for further eval (colonoscopy due as well)...  DIVERTICULOSIS OF COLON (ICD-562.10) - he uses MIRALAX Bid regularly... IRRITABLE BOWEL SYNDROME (ICD-564.1) COLONIC POLYPS (ICD-211.3) ~  last colonoscopy 5/07 by DrSam showed divertics only... f/u 64yrs. ~  CT Abd 4/10 in ER showed atx bases, coronary calcif, s/p GB, abdAo calcif, 11mm renal stone, divertics, bilat hip AVN. ~  Follow up colonoscopy by DrPerry 5/12 showed severe diverticulosis, 2 sm polyps= tubular adenoma & f/u planned 5 yrs...  RENAL CALCULUS, HX OF (ICD-V13.01) BENIGN PROSTATIC HYPERTROPHY, HX OF (ICD-V13.8) - on FLOMAX 0.4mg /d w/ improved symptoms (he states flow better since stopping Vit E supplement). ~  4/12:  Routine PSA= 4.9 (it was 1.02  4/11) & we rx w/ Doxy Bid x14d, thewn plan recheck PSA==> 1.30  DEGENERATIVE JOINT DISEASE (ICD-715.90) - pt states "I have a bum knee" and eval by DrDuda w/ viscosupplementation shots... Note: Abd CT w/ bilat hip AVN noted... takes TYLENOL Prn... ~  11/10:  s/p left knee arthroscopy by DrDuda... ~  10/11:  s/p fall w/ left knee injury- s/p cortisone shot by DrDuda; hx shot in left shoulder too.  BACK PAIN, LUMBAR (ICD-724.2)  ANXIETY (ICD-300.00) - not currently on meds for nerves.  ANEMIA (ICD-285.9) - prev hx of GIB... 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  Health Maintenance: ~  GI: followed by DrPerry> Colonoscopy 5/07 by DrSam... f/u planned 5 yrs. ~  GU:  See PSAs recorded above... ~  Immunizations: Pneumovax in 2008 @ age 41;  Tetanus- given 4/10;  he gets yearly seasonal Flu vaccine.   Past Surgical History  Procedure Date  . Cholecystectomy   . Cataract extraction   . Skin cancer excision 3/10    left hand   . Knee surgery 11/10    Outpatient Encounter Prescriptions as of 02/11/2012  Medication Sig Dispense Refill  . aspirin 81 MG tablet Take 81 mg by mouth daily.        . Cholecalciferol (VITAMIN D) 2000 UNITS CAPS Take 1 capsule by mouth daily.        . fluticasone-salmeterol (ADVAIR HFA) 115-21 MCG/ACT inhaler Inhale 1 puff into the lungs 2 (two) times daily.      . Guaifenesin (MUCINEX MAXIMUM STRENGTH) 1200 MG TB12 Take by mouth.        . hyoscyamine (LEVSIN SL) 0.125 MG SL tablet Place 0.125 mg under the tongue every 4 (  four) hours as needed. As needed for abdominal cramping       . metFORMIN (GLUCOPHAGE) 500 MG tablet Take 1 tablet (500 mg total) by mouth 2 (two) times daily with a meal.  60 tablet  11  . Multiple Vitamins-Minerals (MULTIVITAMIN WITH MINERALS) tablet Take 1 tablet by mouth daily.        . niacin (NIASPAN) 500  MG CR tablet Take 1 tablet (500 mg total) by mouth at bedtime.  30 tablet  11  . nitroGLYCERIN (NITROSTAT) 0.4 MG SL tablet Place 1 tablet (0.4 mg total) under the tongue every 5 (five) minutes as needed for chest pain.  25 tablet  3  . NON FORMULARY Inject as directed every 30 (thirty) days. Allergy shot      . ONE TOUCH ULTRA TEST test strip USE AS DIRECTED  100 each  2  . pantoprazole (PROTONIX) 40 MG tablet Take 1 tablet (40 mg total) by mouth daily.  30 tablet  11  . polyethylene glycol powder (MIRALAX) powder Take 17 g by mouth 2 (two) times daily. Take as directed  1054 g  11  . prochlorperazine (COMPRO) 25 MG suppository Place 1 suppository (25 mg total) rectally every 12 (twelve) hours as needed for nausea.  12 suppository  0  . sildenafil (VIAGRA) 100 MG tablet Take 1 tablet (100 mg total) by mouth as needed for erectile dysfunction.  10 tablet  5  . sodium chloride (OCEAN) 0.65 % nasal spray 1 spray by Nasal route as needed.        . Tamsulosin HCl (FLOMAX) 0.4 MG CAPS TAKE ONE CAPSULE EVERY DAY  30 capsule  11  . triamcinolone (KENALOG) 0.1 % cream Apply topically 2 (two) times daily. As directed       . valsartan (DIOVAN) 160 MG tablet Take 1 tablet (160 mg total) by mouth daily.  30 tablet  11  . sildenafil (VIAGRA) 50 MG tablet Take 1 tablet (50 mg total) by mouth daily as needed for erectile dysfunction.  10 tablet  2   Facility-Administered Encounter Medications as of 02/11/2012  Medication Dose Route Frequency Provider Last Rate Last Dose  . levalbuterol (XOPENEX) nebulizer solution 0.63 mg  0.63 mg Nebulization Once Julio Sicks, NP        Allergies  Allergen Reactions  . Prednisone     REACTION: high dose intolerance    Current Medications, Allergies, Past Medical History, Past Surgical History, Family History, and Social History were reviewed in [employer] record.    Review of Systems         See HPI - all other systems neg except as  noted... The patient complains of decreased hearing, dyspnea on exertion, headaches, and difficulty walking.  The patient denies anorexia, fever, weight loss, weight gain, vision loss, hoarseness, chest pain, syncope, peripheral edema, prolonged cough, hemoptysis, abdominal pain, melena, hematochezia, severe indigestion/heartburn, hematuria, incontinence, muscle weakness, suspicious skin lesions, transient blindness, depression, unusual weight change, abnormal bleeding, enlarged lymph nodes, and angioedema.     Objective:   Physical Exam      WD, WN, 76 y/o WM in NAD... GENERAL:  Alert & oriented; pleasant & cooperative... HEENT:  [place]/AT, EOM-full, PERRLA, EACs-clear, TMs-wnl, NOSE- pale, congested, THROAT-clear & wnl. NECK:  Supple w/ fairROM; no JVD; normal carotid impulses w/o bruits; no thyromegaly or nodules palpated; no lymphadenopathy. CHEST:  Clear to P & A; without wheezes/ rales/ or rhonchi heard... HEART:  Regular Rhythm; without murmurs/  rubs/ or gallops detected...  ABDOMEN:  Soft & nontender; normal bowel sounds; no organomegaly or masses palpated., no guarding, or rebound EXT: without deformities, mild arthritic changes; no varicose veins/ venous insuffic/ or edema. NEURO:  CN's intact;  no focal neuro deficits... DERM:  No lesions noted; no rash etc...  RADIOLOGY DATA:  Reviewed in the EPIC EMR & discussed w/ the patient...  LABORATORY DATA:  Reviewed in the EPIC EMR & discussed w/ the patient...   Assessment & Plan:   COPD>  He continues on Advair, Proair rescue, Mucinex; also Claritin, Flonase, Saline; stable continue same Rx...  HBP>  Seems adeq controlled on Diovan160 but cost went up; switch to LOSARTAN 100mg /d...  R/O CAD>  Followed by DrHochrein, and doing satis... continue ASA & risk factor reduction....  Lipids>  Looks satis on his diet + Niacin...  DM>  Control is improved w/ A1c stable at 7.1; he was told there are more meds in his future if he doesn't get  his wt down (currently~230#)...  GI>  Per DrPerry w/ GERD, ?Gastroparesis, Divertics, IBS, Colon polyps> f/u colon 5/12 w/ divertics & 2 sm adenomas, repeat planned ~65yrs...  Anxiety>  Certainly an issue, he does not want anxiolytic Rx...   Patient's Medications  New Prescriptions   LOSARTAN (COZAAR) 100 MG TABLET    Take 1 tablet (100 mg total) by mouth daily.   TADALAFIL (CIALIS) 20 MG TABLET    Take 1 tablet (20 mg total) by mouth daily as needed for erectile dysfunction.  Previous Medications   ASPIRIN 81 MG TABLET    Take 81 mg by mouth daily.     CHOLECALCIFEROL (VITAMIN D) 2000 UNITS CAPS    Take 1 capsule by mouth daily.     FLUTICASONE-SALMETEROL (ADVAIR HFA) 115-21 MCG/ACT INHALER    Inhale 1 puff into the lungs 2 (two) times daily.   GUAIFENESIN (MUCINEX MAXIMUM STRENGTH) 1200 MG TB12    Take by mouth.     HYOSCYAMINE (LEVSIN SL) 0.125 MG SL TABLET    Place 0.125 mg under the tongue every 4 (four) hours as needed. As needed for abdominal cramping    METFORMIN (GLUCOPHAGE) 500 MG TABLET    Take 1 tablet (500 mg total) by mouth 2 (two) times daily with a meal.   MULTIPLE VITAMINS-MINERALS (MULTIVITAMIN WITH MINERALS) TABLET    Take 1 tablet by mouth daily.     NIACIN (NIASPAN) 500 MG CR TABLET    Take 1 tablet (500 mg total) by mouth at bedtime.   NITROGLYCERIN (NITROSTAT) 0.4 MG SL TABLET    Place 1 tablet (0.4 mg total) under the tongue every 5 (five) minutes as needed for chest pain.   NON FORMULARY    Inject as directed every 30 (thirty) days. Allergy shot   ONE TOUCH ULTRA TEST TEST STRIP    USE AS DIRECTED   PANTOPRAZOLE (PROTONIX) 40 MG TABLET    Take 1 tablet (40 mg total) by mouth daily.   POLYETHYLENE GLYCOL POWDER (MIRALAX) POWDER    Take 17 g by mouth 2 (two) times daily. Take as directed   PROCHLORPERAZINE (COMPRO) 25 MG SUPPOSITORY    Place 1 suppository (25 mg total) rectally every 12 (twelve) hours as needed for nausea.   SILDENAFIL (VIAGRA) 50 MG TABLET    Take 1  tablet (50 mg total) by mouth daily as needed for erectile dysfunction.   SODIUM CHLORIDE (OCEAN) 0.65 % NASAL SPRAY    1 spray by Nasal  route as needed.     TAMSULOSIN HCL (FLOMAX) 0.4 MG CAPS    TAKE ONE CAPSULE EVERY DAY   TRIAMCINOLONE (KENALOG) 0.1 % CREAM    Apply topically 2 (two) times daily. As directed   Modified Medications   No medications on file  Discontinued Medications   SILDENAFIL (VIAGRA) 100 MG TABLET    Take 1 tablet (100 mg total) by mouth as needed for erectile dysfunction.   VALSARTAN (DIOVAN) 160 MG TABLET    Take 1 tablet (160 mg total) by mouth daily.

## 2012-02-15 DIAGNOSIS — J309 Allergic rhinitis, unspecified: Secondary | ICD-10-CM | POA: Diagnosis not present

## 2012-02-15 DIAGNOSIS — S62319A Displaced fracture of base of unspecified metacarpal bone, initial encounter for closed fracture: Secondary | ICD-10-CM | POA: Diagnosis not present

## 2012-03-03 DIAGNOSIS — L570 Actinic keratosis: Secondary | ICD-10-CM | POA: Diagnosis not present

## 2012-03-03 DIAGNOSIS — Z85828 Personal history of other malignant neoplasm of skin: Secondary | ICD-10-CM | POA: Diagnosis not present

## 2012-03-03 DIAGNOSIS — B351 Tinea unguium: Secondary | ICD-10-CM | POA: Diagnosis not present

## 2012-03-07 ENCOUNTER — Other Ambulatory Visit: Payer: Self-pay | Admitting: Internal Medicine

## 2012-03-09 DIAGNOSIS — S62319A Displaced fracture of base of unspecified metacarpal bone, initial encounter for closed fracture: Secondary | ICD-10-CM | POA: Diagnosis not present

## 2012-03-14 DIAGNOSIS — J309 Allergic rhinitis, unspecified: Secondary | ICD-10-CM | POA: Diagnosis not present

## 2012-04-01 DIAGNOSIS — T6391XA Toxic effect of contact with unspecified venomous animal, accidental (unintentional), initial encounter: Secondary | ICD-10-CM | POA: Diagnosis not present

## 2012-04-14 DIAGNOSIS — J309 Allergic rhinitis, unspecified: Secondary | ICD-10-CM | POA: Diagnosis not present

## 2012-04-20 IMAGING — CR DG CHEST 2V
2 series · 2 of 2 positions shown · non-contrast
Comparison: 03/27/2009.  12/30/2008.

CLINICAL DATA: COPD.  Chest pain.  Ex-smoker.

CHEST - 2 VIEW

[view not recorded (1 of 2)]
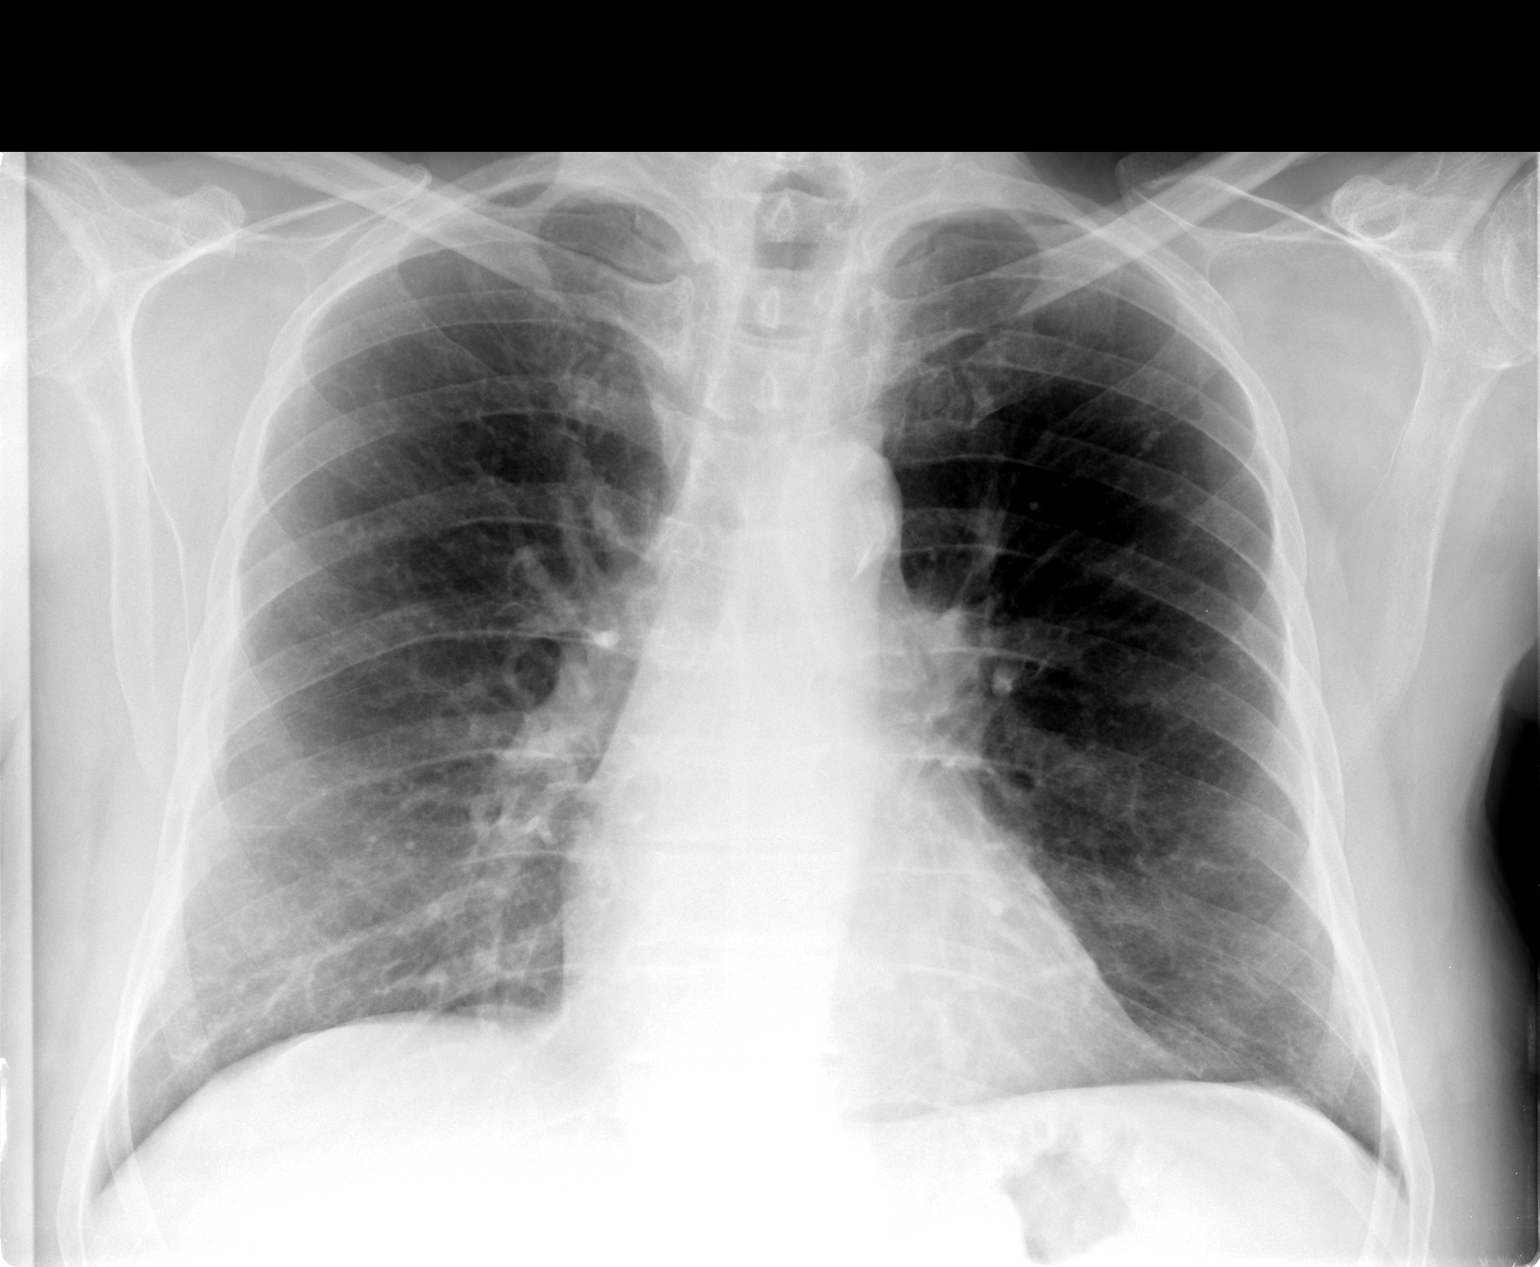

[view not recorded (2 of 2)]
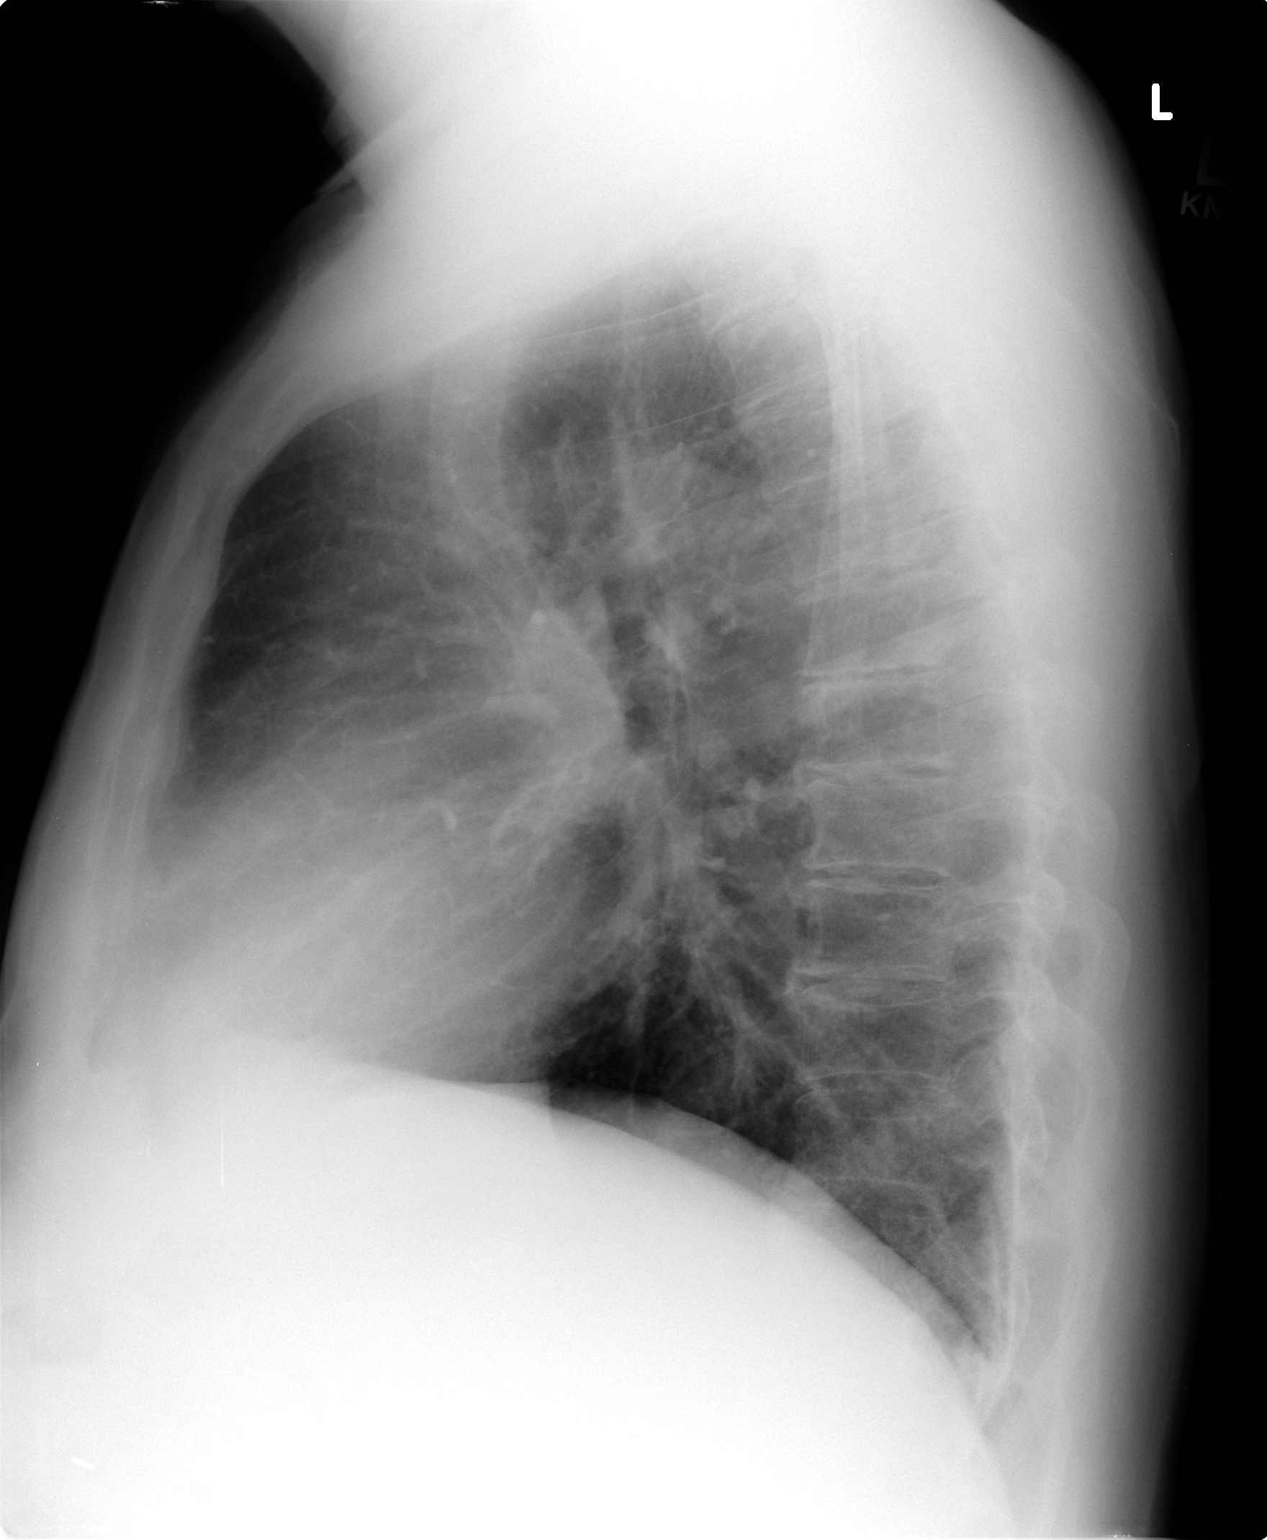

[2 of 2 positions shown; findings below may reference images not displayed]

FINDINGS: Stable configuration of the left suprahilar vessels.  The
no airspace disease.  No effusion.  Cardiopericardial silhouette
within normal limits.  Aortic arch atherosclerotic calcifications.
Trachea midline.  Dextroconvex thoracic scoliosis. Thoracic
spondylosis.  Enlargement retrosternal clear space compatible with
emphysema.
IMPRESSION: No active cardiopulmonary disease.  Unchanged appearance of the
chest.

## 2012-05-03 ENCOUNTER — Ambulatory Visit (INDEPENDENT_AMBULATORY_CARE_PROVIDER_SITE_OTHER): Payer: Medicare Other | Admitting: Adult Health

## 2012-05-03 ENCOUNTER — Encounter: Payer: Self-pay | Admitting: Adult Health

## 2012-05-03 VITALS — BP 152/68 | HR 92 | Temp 98.0°F | Ht 74.0 in | Wt 228.5 lb

## 2012-05-03 DIAGNOSIS — J449 Chronic obstructive pulmonary disease, unspecified: Secondary | ICD-10-CM | POA: Diagnosis not present

## 2012-05-03 MED ORDER — AZITHROMYCIN 250 MG PO TABS: ORAL_TABLET | ORAL | Status: AC

## 2012-05-03 MED ORDER — HYDROCODONE-HOMATROPINE 5-1.5 MG/5ML PO SYRP: 5.0000 mL | ORAL_SOLUTION | Freq: Four times a day (QID) | ORAL | Status: AC | PRN

## 2012-05-03 NOTE — Assessment & Plan Note (Addendum)
Flare  xopenex neb given in office   Plan  Zpack take as directed.  Mucinex DM Twice daily  As needed  Cough/congestion  Saline nasal rinses As needed   Claritin daily As needed  Drainage  Hydromet 1/2-1 tsp every 6 hr as needed for cough -may make you sleepy  Please contact office for sooner follow up if symptoms do not improve or worsen or seek emergency care

## 2012-05-03 NOTE — Progress Notes (Signed)
Subjective:    Patient ID: Jesse Santos, male    DOB: 1935-12-13, 76 y.o.   MRN: [id number]  HPI 76 y/o WM   ~  July 10, 2010:  mult somatic complaints- fell 10/1 w/ left knee injury & cortisone shot helped, c/o head congestion, CWP, sugars sl up, HA, etc... on a pos note- he stopped Vit E & urine flow is much better he says...  breathing stable- no ch in DOE;  BP controlled w/ diet, no salt;  Lipids OK on Niaspan;  DM control adeq on Metform;  refills written today.  ~  January 02, 2011:  34mo ROV & he notes lots of stress from dental problems etc; notes his BP has been up & he saw DrHochrein w/ Norvasc prescribed but he states this "stopped my urine flow- same thing happened to a guy at church"; now on Diovan 320mg /d & BP is improved> EKG was normal, he has a Myoview sched soon for screening eval...  Lipids remain adeq controlled on his Niacin;  DM control fair on his Metformin monotherapy (A1c=7.3 & may need additional medication) but he hasn't yet lost any wt & we reviewe3d diet & exercise recommendations;  He is c/o some dysphagia w/ "food stopping on occas"> takes Protonix, Miralax, Levsin- and we discussed f/u eval from DrPerry to check his esoph;  LABS today showed elev PSA 1.0==>4.9 over the last yr (we called in Doxy x14d w/ repeat PSA 78mo & refer to Urology if not back to norm)...  ~  Feb 05, 2011:  78mo ROV & BP remains well controlled on the Diovan320mg /d, he claims that Norvasc and HCTZ "both stopped my urine flow";  He had elev PSA at 4.9 & was treated w/ Doxy x10d> repeat PSA back to normal= 1.30;  He is sched for colonoscopy next wk w/ DrPerry... We reviewed his labs.  ~  August 14, 2011:  34mo ROV & he saw TP ~29mo ago w/ URI/ bronchitis, treated w/ Augmentin, Mucinex, Tylenol & improved;  His COPD is controlled w/ Advair, Proair, Mucinex & we reviewed regular dosing;  BP controlled on Diovan but he refuses the 320 dose saying it caused HA & he knows that it can elev the BS too; he  decr to 1/2 the dose & says that his BP is ok on this dose; he denies CP, palpit, dizzy, SOB, edema...  See prob list below>>  ~  Feb 11, 2012:  34mo ROV & Gala Romney reports a broken bone in right hand from playing w/ the dog- in short arm cast now (DrDuda);  He is in the donut hole & requesting any med changes to generics & we reviewed this esp as it relates to very low cash pay meds;  We decided to change Niaspan to OTC slow release Niacin, and change Diovan160 to Losartan100 & he will follow BP at home after the transition...    He saw DrHochrein 4/13 after being in the ER 11/29/11 for AtypCP> Myoview was neg- no ischemia or infarct, EF=71%; he has been walking daily & no CP, recommended to stay active & get weight down...    He had recent f/u DrShapiro for Ophthalmology> s/p cats, no DM retinopathy seen...    We reviewed prob list, meds, xrays and labs> see below>> CXR 3/13 showed normal heart size, clear lungs, NAD.Marland KitchenMarland Kitchen EKG 3/13 showed NSR, rate75, WNL, NAD... LABS 3/13:  Chems- ok x BS=232;  CBC- wnl... LABS 5/13:  FLP- at goals on Niacin alone;  Chems- ok x BS=132 A1c=7.1 on MetformBid;  TSH=1.42;  PSA=1.04...  05/03/2012 Acute OV  [name] He complains of c/o chest congestion, sore ears, sore throat, and tightness in chest x 4 days.  States spit up mostly clear mucus with a spot of green in it.  Has been using mucinex and claritin without much help Returning from vacation from [place] few days ago  No chest pain , hemoptysis , fever  Wheezing at night worse x 2 days          Problem List:  HEARING LOSS (ICD-389.9) - he has bilat hearing aides...  ALLERGIC RHINITIS (ICD-477.9) - on allergy shots from DrESL Laney Pastor)... plus Claritin, Saline, Mucinex, Flonase. ~  occas bouts of sinusitis requiring antibiotic Rx...  OBSTRUCTIVE SLEEP APNEA (ICD-327.23) - sleep study 8/04 showed RDI= 45 & desat to 88%... mod snoring and leg jerks without sleep disruption... CPAP  perscribed but not using it now- states he can't sleep w/ it on & furthermore he is resting well as long as he stays on his side... denies daytime hypersomnolence & not interested in re-evaluation.  COPD (ICD-496) - ex-smoker, quit 1981... on ADVAIR 100Bid, PROAIR Prn, MUCINEX Bid... he had Pneumovax in 2008... doesn't like Pred Rx...  ~  10/11:  doing well except recent cough, min green sputum (no change in dyspnea)- we will Rx w/ Augmentin, incr Mucinex/ Fluids. ~  4/12:  Breathing at baseline, continue maintenance meds...  HYPERTENSION (ICD-401.9) - prev on diet alone> then on Diovan320, but we decided to change to LOSARTAN 100mg /d 5/13 to save $$ ~  He claims that Norvasc & HCTZ "stopped my urine flow"... ~  10/11:  BP= 128/80 & even better at home he says... denies HA, fatigue, visual changes, CP, palipit, dizziness, syncope, dyspnea, edema, etc... ~  4/12:  Pt has seen TP, DrHochrein, & checking BP daily at home; tried on Norvasc but claims it decr his urine flow; now on Diovan & improved w/ BP= 132/70... ~  5/12:  BP remains well controlled on Diovan 320mg /d= 120/62 today. ~  11/12:  BP= 142/82 on Diovan160 now; denies CP, palpit, dizzy, SOB, edema... ~  5/13:  BP= 128/66 & he's requesting change to cheaper med; rec switch Diovan to LOSARTAN 100mg /d...  R/O CAD (ICD-414.00) - on ASA 81mg /d... Followed by DrHochrein & his notes are reviewed. ~  hx neg cardiac cath 1988 by DrJoe [place]... ~  NuclearStressTest 10/02 was norm- no scar or ischemia, EF=55%. ~  CT Abd 4/10 via ER showed coronary calcif... referred to Cards. ~  Eval by DrHochrein 6/10 showed norm EKG & cardiac exam... treadmill ordered. ~  Treadmill 6/10 showed reasonable exerc tolerance, no ischemic changes, few PVCs, sl incr BP... ~  Follow up Myoview scheduled 4/12 ==> pending  MIXED HYPERLIPIDEMIA (ICD-272.2) - low HDL and NIASPAN 500mg /d started 4/09... ~  FLP 4/09 showed TChol 160, TG 106, HDL 26, LDL 113... rec- diet +  Niaspan 500/d. ~  FLP 10/09 showed TChol 155, TG 78, HDL 33, LDL 106... ~  FLP 4/10 showed TChol 133, TG 63, HDL 30, LDL 91... rec> continue same. ~  FLP 4/11 showed TChol 144, TG 61, HDL 38, LDL 94 ~  FLP 10/11 showed TChol 155, TG 81, HDL 40, LDL 99 ~  FLP 4/12 showed TChol 142, TG 69, HDL 38, LDL 90 ~  FLP 11/12 showed TChol 165, TG 82, HDL 43, LDL 106 ~  FLP 5/13 in Niasp500  showed TChol 155, TG 72, HDL 46, LDL 95... rec change to generic.  DM (ICD-250.00) - on diet + METFORMIN 500mg Bid... he reports BS at home all in the 120-130's... ~  labs 4/09 showed BS= 132, HgA1c= 6.6.Marland KitchenMarland Kitchen rec- same med, better diet... ~  labs 10/09 showed BS= 131, HgA1c= 6.3.Marland KitchenMarland Kitchen ~  labs 4/10 showed BS= 117, HgA1c= 6.2.[name]. rec> same meds/ diet Rx. ~  labs 10/10 showed BS= 138, A1c= 6.5 ~  labs 4/11 showed BS= 121, A1c= 6.5.Marland KitchenMarland Kitchen On Metform500Bid + diet. ~  Dilated eye exam 5/11 by DrShapiro- no retinopathy... ~  labs 10/11 (wt=228#) showed BS= 118, A1c= 7.1.[name]. not as good- get on diet or more meds. ~  Labs 4/12 (wt=229#) showed BS= 119, A1c= 7.3... Wrong direction, may need more meds, get wt down! ~  5/12:  Ophthalmology check by DrShapiro was neg- no retinopathy... ~  Labs 11/12 showed BS= 136, A1c= 7.0 ~  Labs 5/13 on MetformBid showed BS= 132, A1c= 7.1.Marland KitchenMarland Kitchen Continue same, get wt down.  GERD (ICD-530.81) & ? GASTROPARESIS - on PROTONIX 40mg /d, & off Reglan per DrPerry... ~   last EGD 6/07 by DrPerry showed gastic polyp... ~  8/10:  given Compazine suppos for gastroparesis flair by DrGessner. ~  4/12:  C/o intermittent dysphagia & referred back to GI for further eval (colonoscopy due as well)...  DIVERTICULOSIS OF COLON (ICD-562.10) - he uses MIRALAX Bid regularly... IRRITABLE BOWEL SYNDROME (ICD-564.1) COLONIC POLYPS (ICD-211.3) ~  last colonoscopy 5/07 by DrSam showed divertics only... f/u 49yrs. ~  CT Abd 4/10 in ER showed atx bases, coronary calcif, s/p GB, abdAo calcif, 11mm renal stone, divertics, bilat hip  AVN. ~  Follow up colonoscopy by DrPerry 5/12 showed severe diverticulosis, 2 sm polyps= tubular adenoma & f/u planned 5 yrs...  RENAL CALCULUS, HX OF (ICD-V13.01) BENIGN PROSTATIC HYPERTROPHY, HX OF (ICD-V13.8) - on FLOMAX 0.4mg /d w/ improved symptoms (he states flow better since stopping Vit E supplement). ~  4/12:  Routine PSA= 4.9 (it was 1.02 4/11) & we rx w/ Doxy Bid x14d, thewn plan recheck PSA==> 1.30  DEGENERATIVE JOINT DISEASE (ICD-715.90) - pt states "I have a bum knee" and eval by DrDuda w/ viscosupplementation shots... Note: Abd CT w/ bilat hip AVN noted... takes TYLENOL Prn... ~  11/10:  s/p left knee arthroscopy by DrDuda... ~  10/11:  s/p fall w/ left knee injury- s/p cortisone shot by DrDuda; hx shot in left shoulder too.  BACK PAIN, LUMBAR (ICD-724.2)  ANXIETY (ICD-300.00) - not currently on meds for nerves.  ANEMIA (ICD-285.9) - prev hx of GIB... 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  Health Maintenance: ~  GI: followed by DrPerry> Colonoscopy 5/07 by DrSam... f/u planned 5 yrs. ~  GU:  See PSAs recorded above... ~  Immunizations: Pneumovax in 2008 @ age 69;  Tetanus- given 4/10;  he gets yearly seasonal Flu vaccine.   Past Surgical History  Procedure Date  . Cholecystectomy   . Cataract extraction   . Skin cancer excision 3/10    left hand   . Knee surgery 11/10    Outpatient Encounter Prescriptions as of 05/03/2012  Medication Sig Dispense Refill  . aspirin 81 MG tablet Take 81 mg by mouth daily.        . Cholecalciferol (VITAMIN D) 2000 UNITS  CAPS Take 1 capsule by mouth daily.        . COMPRO 25 MG suppository PLACE 1 SUPPOSITORY (25 MG TOTAL) RECTALLY EVERY 12 (TWELVE) HOURS AS NEEDED FOR NAUSEA.  12 suppository  0  . fluticasone-salmeterol (ADVAIR HFA) 115-21 MCG/ACT inhaler Inhale 1 puff into the lungs 2 (two) times  daily.      . Guaifenesin (MUCINEX MAXIMUM STRENGTH) 1200 MG TB12 Take by mouth.        . hyoscyamine (LEVSIN SL) 0.125 MG SL tablet Place 0.125 mg under the tongue every 4 (four) hours as needed. As needed for abdominal cramping       . losartan (COZAAR) 100 MG tablet Take 1 tablet (100 mg total) by mouth daily.  90 tablet  3  . metFORMIN (GLUCOPHAGE) 500 MG tablet Take 1 tablet (500 mg total) by mouth 2 (two) times daily with a meal.  60 tablet  11  . mometasone (NASONEX) 50 MCG/ACT nasal spray Place 2 sprays into the nose as needed.      . Multiple Vitamins-Minerals (MULTIVITAMIN WITH MINERALS) tablet Take 1 tablet by mouth daily.        . niacin (NIASPAN) 500 MG CR tablet Take 1 tablet (500 mg total) by mouth at bedtime.  30 tablet  11  . nitroGLYCERIN (NITROSTAT) 0.4 MG SL tablet Place 1 tablet (0.4 mg total) under the tongue every 5 (five) minutes as needed for chest pain.  25 tablet  3  . NON FORMULARY Inject as directed every 30 (thirty) days. Allergy shot      . ONE TOUCH ULTRA TEST test strip USE AS DIRECTED  100 each  2  . pantoprazole (PROTONIX) 40 MG tablet Take 1 tablet (40 mg total) by mouth daily.  30 tablet  11  . polyethylene glycol powder (MIRALAX) powder Take 17 g by mouth 2 (two) times daily. Take as directed  1054 g  11  . sodium chloride (OCEAN) 0.65 % nasal spray 1 spray by Nasal route as needed.        . tadalafil (CIALIS) 20 MG tablet Take 1 tablet (20 mg total) by mouth daily as needed for erectile dysfunction.  10 tablet  12  . Tamsulosin HCl (FLOMAX) 0.4 MG CAPS TAKE ONE CAPSULE EVERY DAY  30 capsule  11  . triamcinolone (KENALOG) 0.1 % cream Apply topically 2 (two) times daily. As directed       . sildenafil (VIAGRA) 50 MG tablet Take 1 tablet (50 mg total) by mouth daily as needed for erectile dysfunction.  10 tablet  2   Facility-Administered Encounter Medications as of 05/03/2012  Medication Dose Route Frequency Provider Last Rate Last Dose  . levalbuterol  (XOPENEX) nebulizer solution 0.63 mg  0.63 mg Nebulization Once Julio Sicks, NP        Allergies  Allergen Reactions  . Prednisone     REACTION: high dose intolerance    Current Medications, Allergies, Past Medical History, Past Surgical History, Family History, and Social History were reviewed in [employer] record.    Review of Systems         Constitutional:   No  weight loss, night sweats,  Fevers, chills,  +fatigue, or  lassitude.  HEENT:   No headaches,  Difficulty swallowing,  Tooth/dental problems, or  Sore throat,                No sneezing, itching, ear ache,  +nasal congestion, post nasal drip,  CV:  No chest pain,  Orthopnea, PND, swelling in lower extremities, anasarca, dizziness, palpitations, syncope.   GI  No heartburn, indigestion, abdominal pain, nausea, vomiting, diarrhea, change in bowel habits, loss of appetite, bloody stools.   Resp:   No coughing up of blood.    No chest wall deformity  Skin: no rash or lesions.  GU: no dysuria, change in color of urine, no urgency or frequency.  No flank pain, no hematuria   MS:  No joint pain or swelling.  No decreased range of motion.  No back pain.  Psych:  No change in mood or affect. No depression or anxiety.  No memory loss.       Objective:   Physical Exam      WD, WN, 76 y/o WM in NAD... GENERAL:  Alert & oriented; pleasant & cooperative... HEENT:  [place]/AT,   EACs-clear, TMs-wnl, NOSE- pale, congested, THROAT-clear & wnl. NECK:  Supple w/ fairROM; no JVD; normal carotid impulses w/o bruits; no thyromegaly or nodules palpated; no lymphadenopathy. CHEST:  Coarse BS  without wheezes/ rales/ or rhonchi heard... HEART:  Regular Rhythm; without murmurs/ rubs/ or gallops detected...  ABDOMEN:  Soft & nontender; normal bowel sounds; no organomegaly or masses palpated., no guarding, or rebound EXT: without deformities, mild arthritic changes; no varicose veins/ venous insuffic/ or  edema. NEURO:   no focal neuro deficits noted  DERM:  No lesions noted; no rash etc...   Assessment & Plan:    Patient's Medications  New Prescriptions   No medications on file  Previous Medications   ASPIRIN 81 MG TABLET    Take 81 mg by mouth daily.     CHOLECALCIFEROL (VITAMIN D) 2000 UNITS CAPS    Take 1 capsule by mouth daily.     COMPRO 25 MG SUPPOSITORY    PLACE 1 SUPPOSITORY (25 MG TOTAL) RECTALLY EVERY 12 (TWELVE) HOURS AS NEEDED FOR NAUSEA.   FLUTICASONE-SALMETEROL (ADVAIR HFA) 115-21 MCG/ACT INHALER    Inhale 1 puff into the lungs 2 (two) times daily.   GUAIFENESIN (MUCINEX MAXIMUM STRENGTH) 1200 MG TB12    Take by mouth.     HYOSCYAMINE (LEVSIN SL) 0.125 MG SL TABLET    Place 0.125 mg under the tongue every 4 (four) hours as needed. As needed for abdominal cramping    LOSARTAN (COZAAR) 100 MG TABLET    Take 1 tablet (100 mg total) by mouth daily.   METFORMIN (GLUCOPHAGE) 500 MG TABLET    Take 1 tablet (500 mg total) by mouth 2 (two) times daily with a meal.   MOMETASONE (NASONEX) 50 MCG/ACT NASAL SPRAY    Place 2 sprays into the nose as needed.   MULTIPLE VITAMINS-MINERALS (MULTIVITAMIN WITH MINERALS) TABLET    Take 1 tablet by mouth daily.     NIACIN (NIASPAN) 500 MG CR TABLET    Take 1 tablet (500 mg total) by mouth at bedtime.   NITROGLYCERIN (NITROSTAT) 0.4 MG SL TABLET    Place 1 tablet (0.4 mg total) under the tongue every 5 (five) minutes as needed for chest pain.   NON FORMULARY    Inject as directed every 30 (thirty) days. Allergy shot   ONE TOUCH ULTRA TEST TEST STRIP    USE AS DIRECTED   PANTOPRAZOLE (PROTONIX) 40 MG TABLET    Take 1 tablet (40 mg total) by mouth daily.   POLYETHYLENE GLYCOL POWDER (MIRALAX) POWDER    Take 17 g by mouth 2 (two) times daily. Take as  directed   SILDENAFIL (VIAGRA) 50 MG TABLET    Take 1 tablet (50 mg total) by mouth daily as needed for erectile dysfunction.   SODIUM CHLORIDE (OCEAN) 0.65 % NASAL SPRAY    1 spray by Nasal route as  needed.     TADALAFIL (CIALIS) 20 MG TABLET    Take 1 tablet (20 mg total) by mouth daily as needed for erectile dysfunction.   TAMSULOSIN HCL (FLOMAX) 0.4 MG CAPS    TAKE ONE CAPSULE EVERY DAY   TRIAMCINOLONE (KENALOG) 0.1 % CREAM    Apply topically 2 (two) times daily. As directed   Modified Medications   No medications on file  Discontinued Medications   No medications on file

## 2012-05-03 NOTE — Patient Instructions (Signed)
Zpack take as directed.  Mucinex DM Twice daily  As needed  Cough/congestion  Saline nasal rinses As needed   Claritin daily As needed  Drainage  Hydromet 1/2-1 tsp every 6 hr as needed for cough -may make you sleepy  Please contact office for sooner follow up if symptoms do not improve or worsen or seek emergency care

## 2012-05-04 MED ORDER — LEVALBUTEROL HCL 0.63 MG/3ML IN NEBU
0.6300 mg | INHALATION_SOLUTION | Freq: Once | RESPIRATORY_TRACT | Status: AC
  Administered 2012-05-03: 0.63 mg via RESPIRATORY_TRACT

## 2012-05-04 NOTE — Addendum Note (Signed)
Addended by: Fenton Foy on: 05/04/2012 11:13 AM   Modules accepted: Orders

## 2012-05-11 DIAGNOSIS — J3089 Other allergic rhinitis: Secondary | ICD-10-CM | POA: Diagnosis not present

## 2012-05-11 DIAGNOSIS — J45909 Unspecified asthma, uncomplicated: Secondary | ICD-10-CM | POA: Diagnosis not present

## 2012-05-11 DIAGNOSIS — J309 Allergic rhinitis, unspecified: Secondary | ICD-10-CM | POA: Diagnosis not present

## 2012-05-25 ENCOUNTER — Ambulatory Visit (INDEPENDENT_AMBULATORY_CARE_PROVIDER_SITE_OTHER): Payer: Medicare Other

## 2012-05-25 DIAGNOSIS — Z23 Encounter for immunization: Secondary | ICD-10-CM

## 2012-05-26 DIAGNOSIS — Z23 Encounter for immunization: Secondary | ICD-10-CM

## 2012-06-01 ENCOUNTER — Telehealth: Payer: Self-pay | Admitting: Pulmonary Disease

## 2012-06-01 NOTE — Telephone Encounter (Signed)
I spoke with pt to advise him of this. He stated he checked with another [facility] and they did have this in stock and just got his rx filled there.

## 2012-06-01 NOTE — Telephone Encounter (Signed)
Fax received from the pharrmcy stating ALL Niaspan is on back order and they are requesting an alternative for the patient. Pls advise.

## 2012-06-01 NOTE — Telephone Encounter (Signed)
Per SN---let him use otc niacin for now until the rx is available again.  thanks

## 2012-06-14 DIAGNOSIS — J309 Allergic rhinitis, unspecified: Secondary | ICD-10-CM | POA: Diagnosis not present

## 2012-07-15 DIAGNOSIS — J309 Allergic rhinitis, unspecified: Secondary | ICD-10-CM | POA: Diagnosis not present

## 2012-07-26 ENCOUNTER — Other Ambulatory Visit: Payer: Self-pay | Admitting: Pulmonary Disease

## 2012-08-01 DIAGNOSIS — J309 Allergic rhinitis, unspecified: Secondary | ICD-10-CM | POA: Diagnosis not present

## 2012-08-05 ENCOUNTER — Telehealth: Payer: Self-pay | Admitting: Pulmonary Disease

## 2012-08-05 MED ORDER — CEPHALEXIN 500 MG PO CAPS: 500.0000 mg | ORAL_CAPSULE | Freq: Three times a day (TID) | ORAL | Status: DC

## 2012-08-05 NOTE — Telephone Encounter (Signed)
Called spoke with patient who c/o head congestion, PND, prod cough with yellow mucus x3days, wheezing and tightness this morning.  Was raking leaves on Monday but wore a mask.  Has been taking hydromet given by TP at 8.20.13 sick visit.  Also taking mucinex.  Pt is requesting rx.  [facility] [address] Allergies  Allergen Reactions  . Prednisone     REACTION: high dose intolerance   Last ov w/ SN 5.20.13, follow up 6 months >> 1.2.14.  Dr Kriste Basque please advise, thanks.

## 2012-08-05 NOTE — Telephone Encounter (Signed)
Per SN---continue the mucinex, hydromet.  Call in keflex 500 mg   #21  1 po tid with no refills.  Called and spoke with pt and he is aware of SN recs and nothing further at this time.

## 2012-08-12 ENCOUNTER — Ambulatory Visit: Payer: Medicare Other | Admitting: Pulmonary Disease

## 2012-08-15 DIAGNOSIS — J309 Allergic rhinitis, unspecified: Secondary | ICD-10-CM | POA: Diagnosis not present

## 2012-08-16 ENCOUNTER — Other Ambulatory Visit: Payer: Self-pay | Admitting: Adult Health

## 2012-08-16 ENCOUNTER — Encounter: Payer: Self-pay | Admitting: Adult Health

## 2012-08-16 ENCOUNTER — Telehealth: Payer: Self-pay | Admitting: Adult Health

## 2012-08-16 ENCOUNTER — Ambulatory Visit (INDEPENDENT_AMBULATORY_CARE_PROVIDER_SITE_OTHER): Payer: Medicare Other | Admitting: Adult Health

## 2012-08-16 ENCOUNTER — Ambulatory Visit (HOSPITAL_BASED_OUTPATIENT_CLINIC_OR_DEPARTMENT_OTHER)
Admission: RE | Admit: 2012-08-16 | Discharge: 2012-08-16 | Disposition: A | Discharge status: Home / Self Care | Payer: Medicare Other | Source: Ambulatory Visit | Attending: Adult Health | Admitting: Adult Health

## 2012-08-16 ENCOUNTER — Telehealth: Payer: Self-pay | Admitting: Pulmonary Disease

## 2012-08-16 VITALS — BP 132/70 | HR 82 | Temp 98.2°F | Ht 74.0 in | Wt 229.0 lb

## 2012-08-16 DIAGNOSIS — R0602 Shortness of breath: Secondary | ICD-10-CM | POA: Insufficient documentation

## 2012-08-16 DIAGNOSIS — J209 Acute bronchitis, unspecified: Secondary | ICD-10-CM | POA: Diagnosis not present

## 2012-08-16 DIAGNOSIS — J069 Acute upper respiratory infection, unspecified: Secondary | ICD-10-CM

## 2012-08-16 DIAGNOSIS — R05 Cough: Secondary | ICD-10-CM | POA: Insufficient documentation

## 2012-08-16 DIAGNOSIS — R059 Cough, unspecified: Secondary | ICD-10-CM | POA: Insufficient documentation

## 2012-08-16 MED ORDER — AMOXICILLIN-POT CLAVULANATE 875-125 MG PO TABS: 1.0000 | ORAL_TABLET | Freq: Two times a day (BID) | ORAL | Status: AC

## 2012-08-16 MED ORDER — LEVALBUTEROL HCL 0.63 MG/3ML IN NEBU
0.6300 mg | INHALATION_SOLUTION | Freq: Once | RESPIRATORY_TRACT | Status: AC
  Administered 2012-08-16: 0.63 mg via RESPIRATORY_TRACT

## 2012-08-16 NOTE — Progress Notes (Signed)
Subjective:    Patient ID: Jesse Santos, male    DOB: 1935-12-13, 76 y.o.   MRN: [id number]  HPI 76 y/o WM   ~  July 10, 2010:  mult somatic complaints- fell 10/1 w/ left knee injury & cortisone shot helped, c/o head congestion, CWP, sugars sl up, HA, etc... on a pos note- he stopped Vit E & urine flow is much better he says...  breathing stable- no ch in DOE;  BP controlled w/ diet, no salt;  Lipids OK on Niaspan;  DM control adeq on Metform;  refills written today.  ~  January 02, 2011:  34mo ROV & he notes lots of stress from dental problems etc; notes his BP has been up & he saw DrHochrein w/ Norvasc prescribed but he states this "stopped my urine flow- same thing happened to a guy at church"; now on Diovan 320mg /d & BP is improved> EKG was normal, he has a Myoview sched soon for screening eval...  Lipids remain adeq controlled on his Niacin;  DM control fair on his Metformin monotherapy (A1c=7.3 & may need additional medication) but he hasn't yet lost any wt & we reviewe3d diet & exercise recommendations;  He is c/o some dysphagia w/ "food stopping on occas"> takes Protonix, Miralax, Levsin- and we discussed f/u eval from DrPerry to check his esoph;  LABS today showed elev PSA 1.0==>4.9 over the last yr (we called in Doxy x14d w/ repeat PSA 78mo & refer to Urology if not back to norm)...  ~  Feb 05, 2011:  78mo ROV & BP remains well controlled on the Diovan320mg /d, he claims that Norvasc and HCTZ "both stopped my urine flow";  He had elev PSA at 4.9 & was treated w/ Doxy x10d> repeat PSA back to normal= 1.30;  He is sched for colonoscopy next wk w/ DrPerry... We reviewed his labs.  ~  August 14, 2011:  34mo ROV & he saw TP ~29mo ago w/ URI/ bronchitis, treated w/ Augmentin, Mucinex, Tylenol & improved;  His COPD is controlled w/ Advair, Proair, Mucinex & we reviewed regular dosing;  BP controlled on Diovan but he refuses the 320 dose saying it caused HA & he knows that it can elev the BS too; he  decr to 1/2 the dose & says that his BP is ok on this dose; he denies CP, palpit, dizzy, SOB, edema...  See prob list below>>  ~  Feb 11, 2012:  34mo ROV & Jesse Santos reports a broken bone in right hand from playing w/ the dog- in short arm cast now (DrDuda);  He is in the donut hole & requesting any med changes to generics & we reviewed this esp as it relates to very low cash pay meds;  We decided to change Niaspan to OTC slow release Niacin, and change Diovan160 to Losartan100 & he will follow BP at home after the transition...    He saw DrHochrein 4/13 after being in the ER 11/29/11 for AtypCP> Myoview was neg- no ischemia or infarct, EF=71%; he has been walking daily & no CP, recommended to stay active & get weight down...    He had recent f/u DrShapiro for Ophthalmology> s/p cats, no DM retinopathy seen...    We reviewed prob list, meds, xrays and labs> see below>> CXR 3/13 showed normal heart size, clear lungs, NAD.Marland KitchenMarland Kitchen EKG 3/13 showed NSR, rate75, WNL, NAD... LABS 3/13:  Chems- ok x BS=232;  CBC- wnl... LABS 5/13:  FLP- at goals on Niacin alone;  Chems- ok x BS=132 A1c=7.1 on MetformBid;  TSH=1.42;  PSA=1.04...  05/03/12  Acute OV  [name] He complains of c/o chest congestion, sore ears, sore throat, and tightness in chest x 4 days.  States spit up mostly clear mucus with a spot of green in it.  Has been using mucinex and claritin without much help Returning from vacation from [place] few days ago  No chest pain , hemoptysis , fever  Wheezing at night worse x 2 days  >zpack   08/16/2012 Acute OV  Complains of 3 weeks of cough/congestion.  Was call in Keflex which he finished Keflex 4 days ago.  Did not help, still coughing up green, thick mucus. Has a lot of sinus  drainage w/ yellow discharge. Chest /lungs  feels raw - Denies sob or fever .  Leaving on Vacation tomorrow.  No hemoptysis or edema.  OTC robitussin helps at night with cough.          Problem  List:  HEARING LOSS (ICD-389.9) - he has bilat hearing aides...  ALLERGIC RHINITIS (ICD-477.9) - on allergy shots from DrESL Laney Pastor)... plus Claritin, Saline, Mucinex, Flonase. ~  occas bouts of sinusitis requiring antibiotic Rx...  OBSTRUCTIVE SLEEP APNEA (ICD-327.23) - sleep study 8/04 showed RDI= 45 & desat to 88%... mod snoring and leg jerks without sleep disruption... CPAP perscribed but not using it now- states he can't sleep w/ it on & furthermore he is resting well as long as he stays on his side... denies daytime hypersomnolence & not interested in re-evaluation.  COPD (ICD-496) - ex-smoker, quit 1981... on ADVAIR 100Bid, PROAIR Prn, MUCINEX Bid... he had Pneumovax in 2008... doesn't like Pred Rx...  ~  10/11:  doing well except recent cough, min green sputum (no change in dyspnea)- we will Rx w/ Augmentin, incr Mucinex/ Fluids. ~  4/12:  Breathing at baseline, continue maintenance meds...  HYPERTENSION (ICD-401.9) - prev on diet alone> then on Diovan320, but we decided to change to LOSARTAN 100mg /d 5/13 to save $$ ~  He claims that Norvasc & HCTZ "stopped my urine flow"... ~  10/11:  BP= 128/80 & even better at home he says... denies HA, fatigue, visual changes, CP, palipit, dizziness, syncope, dyspnea, edema, etc... ~  4/12:  Pt has seen TP, DrHochrein, & checking BP daily at home; tried on Norvasc but claims it decr his urine flow; now on Diovan & improved w/ BP= 132/70... ~  5/12:  BP remains well controlled on Diovan 320mg /d= 120/62 today. ~  11/12:  BP= 142/82 on Diovan160 now; denies CP, palpit, dizzy, SOB, edema... ~  5/13:  BP= 128/66 & he's requesting change to cheaper med; rec switch Diovan to LOSARTAN 100mg /d...  R/O CAD (ICD-414.00) - on ASA 81mg /d... Followed by DrHochrein & his notes are reviewed. ~  hx neg cardiac cath 1988 by DrJoe [place]... ~  NuclearStressTest 10/02 was norm- no scar or ischemia, EF=55%. ~  CT Abd 4/10 via ER showed coronary calcif... referred to  Cards. ~  Eval by DrHochrein 6/10 showed norm EKG & cardiac exam... treadmill ordered. ~  Treadmill 6/10 showed reasonable exerc tolerance, no ischemic changes, few PVCs, sl incr BP... ~  Follow up Myoview scheduled 4/12 ==> pending  MIXED HYPERLIPIDEMIA (ICD-272.2) - low HDL and NIASPAN 500mg /d started 4/09... ~  FLP 4/09 showed TChol 160, TG 106, HDL 26, LDL 113... rec- diet + Niaspan 500/d. ~  FLP 10/09 showed TChol 155, TG 78, HDL 33,  LDL 106... ~  FLP 4/10 showed TChol 133, TG 63, HDL 30, LDL 91... rec> continue same. ~  FLP 4/11 showed TChol 144, TG 61, HDL 38, LDL 94 ~  FLP 10/11 showed TChol 155, TG 81, HDL 40, LDL 99 ~  FLP 4/12 showed TChol 142, TG 69, HDL 38, LDL 90 ~  FLP 11/12 showed TChol 165, TG 82, HDL 43, LDL 106 ~  FLP 5/13 in Niasp500 showed TChol 155, TG 72, HDL 46, LDL 95... rec change to generic.  DM (ICD-250.00) - on diet + METFORMIN 500mg Bid... he reports BS at home all in the 120-130's... ~  labs 4/09 showed BS= 132, HgA1c= 6.6.Marland KitchenMarland Kitchen rec- same med, better diet... ~  labs 10/09 showed BS= 131, HgA1c= 6.3.Marland KitchenMarland Kitchen ~  labs 4/10 showed BS= 117, HgA1c= 6.2.[name]. rec> same meds/ diet Rx. ~  labs 10/10 showed BS= 138, A1c= 6.5 ~  labs 4/11 showed BS= 121, A1c= 6.5.Marland KitchenMarland Kitchen On Metform500Bid + diet. ~  Dilated eye exam 5/11 by DrShapiro- no retinopathy... ~  labs 10/11 (wt=228#) showed BS= 118, A1c= 7.1.[name]. not as good- get on diet or more meds. ~  Labs 4/12 (wt=229#) showed BS= 119, A1c= 7.3... Wrong direction, may need more meds, get wt down! ~  5/12:  Ophthalmology check by DrShapiro was neg- no retinopathy... ~  Labs 11/12 showed BS= 136, A1c= 7.0 ~  Labs 5/13 on MetformBid showed BS= 132, A1c= 7.1.Marland KitchenMarland Kitchen Continue same, get wt down.  GERD (ICD-530.81) & ? GASTROPARESIS - on PROTONIX 40mg /d, & off Reglan per DrPerry... ~   last EGD 6/07 by DrPerry showed gastic polyp... ~  8/10:  given Compazine suppos for gastroparesis flair by DrGessner. ~  4/12:  C/o intermittent dysphagia & referred  back to GI for further eval (colonoscopy due as well)...  DIVERTICULOSIS OF COLON (ICD-562.10) - he uses MIRALAX Bid regularly... IRRITABLE BOWEL SYNDROME (ICD-564.1) COLONIC POLYPS (ICD-211.3) ~  last colonoscopy 5/07 by DrSam showed divertics only... f/u 64yrs. ~  CT Abd 4/10 in ER showed atx bases, coronary calcif, s/p GB, abdAo calcif, 11mm renal stone, divertics, bilat hip AVN. ~  Follow up colonoscopy by DrPerry 5/12 showed severe diverticulosis, 2 sm polyps= tubular adenoma & f/u planned 5 yrs...  RENAL CALCULUS, HX OF (ICD-V13.01) BENIGN PROSTATIC HYPERTROPHY, HX OF (ICD-V13.8) - on FLOMAX 0.4mg /d w/ improved symptoms (he states flow better since stopping Vit E supplement). ~  4/12:  Routine PSA= 4.9 (it was 1.02 4/11) & we rx w/ Doxy Bid x14d, thewn plan recheck PSA==> 1.30  DEGENERATIVE JOINT DISEASE (ICD-715.90) - pt states "I have a bum knee" and eval by DrDuda w/ viscosupplementation shots... Note: Abd CT w/ bilat hip AVN noted... takes TYLENOL Prn... ~  11/10:  s/p left knee arthroscopy by DrDuda... ~  10/11:  s/p fall w/ left knee injury- s/p cortisone shot by DrDuda; hx shot in left shoulder too.  BACK PAIN, LUMBAR (ICD-724.2)  ANXIETY (ICD-300.00) - not currently on meds for nerves.  ANEMIA (ICD-285.9) - prev hx of GIB... 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  Health Maintenance: ~  GI: followed by DrPerry> Colonoscopy 5/07 by DrSam... f/u planned 5 yrs. ~  GU:  See PSAs recorded above... ~  Immunizations: Pneumovax in 2008 @ age 58;  Tetanus- given 4/10;  he gets yearly  seasonal Flu vaccine.   Past Surgical History  Procedure Date  . Cholecystectomy   . Cataract extraction   . Skin cancer excision 3/10    left hand   . Knee surgery 11/10    Outpatient Encounter Prescriptions as of 08/16/2012  Medication Sig Dispense Refill   . aspirin 81 MG tablet Take 81 mg by mouth daily.        . Cholecalciferol (VITAMIN D) 2000 UNITS CAPS Take 1 capsule by mouth daily.        . COMPRO 25 MG suppository PLACE 1 SUPPOSITORY (25 MG TOTAL) RECTALLY EVERY 12 (TWELVE) HOURS AS NEEDED FOR NAUSEA.  12 suppository  0  . dextromethorphan-guaiFENesin (MUCINEX DM) 30-600 MG per 12 hr tablet Take 1 tablet by mouth every 12 (twelve) hours.      . fluticasone-salmeterol (ADVAIR HFA) 115-21 MCG/ACT inhaler Inhale 1 puff into the lungs 2 (two) times daily.      . hyoscyamine (LEVSIN SL) 0.125 MG SL tablet Place 0.125 mg under the tongue every 4 (four) hours as needed. As needed for abdominal cramping       . losartan (COZAAR) 100 MG tablet Take 1 tablet (100 mg total) by mouth daily.  90 tablet  3  . metFORMIN (GLUCOPHAGE) 500 MG tablet Take 1 tablet (500 mg total) by mouth 2 (two) times daily with a meal.  60 tablet  11  . mometasone (NASONEX) 50 MCG/ACT nasal spray Place 2 sprays into the nose as needed.      . Multiple Vitamins-Minerals (MULTIVITAMIN WITH MINERALS) tablet Take 1 tablet by mouth daily.        . niacin (NIASPAN) 500 MG CR tablet Take 1 tablet (500 mg total) by mouth at bedtime.  30 tablet  11  . nitroGLYCERIN (NITROSTAT) 0.4 MG SL tablet Place 1 tablet (0.4 mg total) under the tongue every 5 (five) minutes as needed for chest pain.  25 tablet  3  . NON FORMULARY Inject as directed every 30 (thirty) days. Allergy shot      . ONE TOUCH ULTRA TEST test strip USE AS DIRECTED  100 each  2  . pantoprazole (PROTONIX) 40 MG tablet Take 1 tablet (40 mg total) by mouth daily.  30 tablet  11  . polyethylene glycol powder (MIRALAX) powder Take 17 g by mouth 2 (two) times daily. Take as directed  1054 g  11  . sodium chloride (OCEAN) 0.65 % nasal spray 1 spray by Nasal route as needed.        . Tamsulosin HCl (FLOMAX) 0.4 MG CAPS TAKE ONE CAPSULE EVERY DAY  30 capsule  11  . triamcinolone (KENALOG) 0.1 % cream Apply topically 2 (two) times  daily. As directed       . [DISCONTINUED] Guaifenesin (MUCINEX MAXIMUM STRENGTH) 1200 MG TB12 Take by mouth.        . sildenafil (VIAGRA) 50 MG tablet Take 1 tablet (50 mg total) by mouth daily as needed for erectile dysfunction.  10 tablet  2  . tadalafil (CIALIS) 20 MG tablet Take 1 tablet (20 mg total) by mouth daily as needed for erectile dysfunction.  10 tablet  12  . [DISCONTINUED] cephALEXin (KEFLEX) 500 MG capsule Take 1 capsule (500 mg total) by mouth 3 (three) times daily.  21 capsule  0   Facility-Administered Encounter Medications as of 08/16/2012  Medication Dose Route Frequency Provider Last Rate Last Dose  . [DISCONTINUED] levalbuterol (XOPENEX) nebulizer solution 0.63 mg  0.63 mg  Nebulization Once Julio Sicks, NP        Allergies  Allergen Reactions  . Prednisone     REACTION: high dose intolerance    Current Medications, Allergies, Past Medical History, Past Surgical History, Family History, and Social History were reviewed in [employer] record.    Review of Systems         Constitutional:   No  weight loss, night sweats,  Fevers, chills,  +fatigue, or  lassitude.  HEENT:   No headaches,  Difficulty swallowing,  Tooth/dental problems, or  Sore throat,                No sneezing, itching, ear ache,  +nasal congestion, post nasal drip,   CV:  No chest pain,  Orthopnea, PND, swelling in lower extremities, anasarca, dizziness, palpitations, syncope.   GI  No heartburn, indigestion, abdominal pain, nausea, vomiting, diarrhea, change in bowel habits, loss of appetite, bloody stools.   Resp:   No coughing up of blood.    No chest wall deformity  Skin: no rash or lesions.  GU: no dysuria, change in color of urine, no urgency or frequency.  No flank pain, no hematuria   MS:  No joint pain or swelling.  No decreased range of motion.  No back pain.  Psych:  No change in mood or affect. No depression or anxiety.  No memory  loss.       Objective:   Physical Exam      WD, WN, 76 y/o WM in NAD... GENERAL:  Alert & oriented; pleasant & cooperative... HEENT:  [place]/AT,   EACs-clear, TMs-wnl, NOSE- clear drainage , max sinus tenderness THROAT-clear & wnl. NECK:  Supple w/ fairROM; no JVD; normal carotid impulses w/o bruits; no thyromegaly or nodules palpated; no lymphadenopathy. CHEST:  Diminshed BS in bases   without wheezes/ rales/ or rhonchi heard... HEART:  Regular Rhythm; without murmurs/ rubs/ or gallops detected...  ABDOMEN:  Soft & nontender; normal bowel sounds; no organomegaly or masses palpated., no guarding, or rebound EXT: without deformities, mild arthritic changes; no varicose veins/ venous insuffic/ or edema. NEURO:   no focal neuro deficits noted  DERM:  No lesions noted; no rash etc...   Assessment & Plan:

## 2012-08-16 NOTE — Addendum Note (Signed)
Addended by: Abigail Miyamoto D on: 08/16/2012 10:44 AM   Modules accepted: Orders

## 2012-08-16 NOTE — Telephone Encounter (Signed)
Made in error. Jesse Santos  °

## 2012-08-16 NOTE — Telephone Encounter (Signed)
Pt calling again in ref to previous msg can be reached at [phone].Jesse Santos

## 2012-08-16 NOTE — Patient Instructions (Addendum)
Augmentin 875mg  Twice daily  For 10 days  Mucinex DM Twice daily  As needed  Cough/congestion  Fluids and rest  Saline nasal rinses As needed   Please contact office for sooner follow up if symptoms do not improve or worsen or seek emergency care  follow up Dr. Kriste Basque  Next month as planned  I will call with xray results.

## 2012-08-16 NOTE — Telephone Encounter (Signed)
Called and spoke with pt and he is aware of cxr results per TP.  Pt voiced his understanding and nothing further is needed.

## 2012-08-16 NOTE — Assessment & Plan Note (Signed)
Acute Bronchitis and sinusitis -slow to resolve  xopenex neb in office  Check cxr   Plan Augmentin 875mg  Twice daily  For 10 days  Mucinex DM Twice daily  As needed  Cough/congestion  Fluids and rest  Saline nasal rinses As needed   Please contact office for sooner follow up if symptoms do not improve or worsen or seek emergency care  follow up Dr. Kriste Basque  Next month as planned  I will call with xray results.

## 2012-08-16 NOTE — Progress Notes (Signed)
Quick Note:  Spoke with patient made him aware of results as listed below per Tammy. Verbalized understanding and nothing further needed at this time. ______

## 2012-08-16 NOTE — Telephone Encounter (Signed)
Error.Jesse Santos ° °

## 2012-08-16 NOTE — Telephone Encounter (Signed)
Spoke with patient made him aware of results as listed below per Tammy.  Verbalized understanding and nothing further needed at this time.  Notes Recorded by Julio Sicks, NP on 08/16/2012 at 4:26 PM No sign of PNA  Cont on current regimen.  follow up as planned and As needed  Please contact office for sooner follow up if symptoms do not improve or worsen or seek emergency care

## 2012-08-18 DIAGNOSIS — J309 Allergic rhinitis, unspecified: Secondary | ICD-10-CM | POA: Diagnosis not present

## 2012-08-22 ENCOUNTER — Other Ambulatory Visit: Payer: Self-pay | Admitting: Pulmonary Disease

## 2012-08-22 DIAGNOSIS — J309 Allergic rhinitis, unspecified: Secondary | ICD-10-CM | POA: Diagnosis not present

## 2012-08-24 DIAGNOSIS — J309 Allergic rhinitis, unspecified: Secondary | ICD-10-CM | POA: Diagnosis not present

## 2012-08-26 DIAGNOSIS — J309 Allergic rhinitis, unspecified: Secondary | ICD-10-CM | POA: Diagnosis not present

## 2012-09-04 ENCOUNTER — Other Ambulatory Visit: Payer: Self-pay | Admitting: Pulmonary Disease

## 2012-09-15 ENCOUNTER — Ambulatory Visit (INDEPENDENT_AMBULATORY_CARE_PROVIDER_SITE_OTHER): Payer: Medicare Other | Admitting: Pulmonary Disease

## 2012-09-15 ENCOUNTER — Other Ambulatory Visit (INDEPENDENT_AMBULATORY_CARE_PROVIDER_SITE_OTHER): Payer: Medicare Other

## 2012-09-15 ENCOUNTER — Encounter: Payer: Self-pay | Admitting: Pulmonary Disease

## 2012-09-15 VITALS — BP 140/84 | HR 87 | Temp 98.0°F | Ht 74.0 in | Wt 228.8 lb

## 2012-09-15 DIAGNOSIS — Z87898 Personal history of other specified conditions: Secondary | ICD-10-CM

## 2012-09-15 DIAGNOSIS — D649 Anemia, unspecified: Secondary | ICD-10-CM

## 2012-09-15 DIAGNOSIS — E559 Vitamin D deficiency, unspecified: Secondary | ICD-10-CM

## 2012-09-15 DIAGNOSIS — F411 Generalized anxiety disorder: Secondary | ICD-10-CM

## 2012-09-15 DIAGNOSIS — J449 Chronic obstructive pulmonary disease, unspecified: Secondary | ICD-10-CM

## 2012-09-15 DIAGNOSIS — N320 Bladder-neck obstruction: Secondary | ICD-10-CM | POA: Diagnosis not present

## 2012-09-15 DIAGNOSIS — G4733 Obstructive sleep apnea (adult) (pediatric): Secondary | ICD-10-CM | POA: Diagnosis not present

## 2012-09-15 DIAGNOSIS — K219 Gastro-esophageal reflux disease without esophagitis: Secondary | ICD-10-CM

## 2012-09-15 DIAGNOSIS — J309 Allergic rhinitis, unspecified: Secondary | ICD-10-CM | POA: Diagnosis not present

## 2012-09-15 DIAGNOSIS — E782 Mixed hyperlipidemia: Secondary | ICD-10-CM | POA: Diagnosis not present

## 2012-09-15 DIAGNOSIS — I10 Essential (primary) hypertension: Secondary | ICD-10-CM

## 2012-09-15 DIAGNOSIS — I2510 Atherosclerotic heart disease of native coronary artery without angina pectoris: Secondary | ICD-10-CM

## 2012-09-15 DIAGNOSIS — M545 Low back pain, unspecified: Secondary | ICD-10-CM

## 2012-09-15 DIAGNOSIS — E119 Type 2 diabetes mellitus without complications: Secondary | ICD-10-CM

## 2012-09-15 DIAGNOSIS — D126 Benign neoplasm of colon, unspecified: Secondary | ICD-10-CM

## 2012-09-15 DIAGNOSIS — K589 Irritable bowel syndrome without diarrhea: Secondary | ICD-10-CM

## 2012-09-15 DIAGNOSIS — M1990 Unspecified osteoarthritis, unspecified site: Secondary | ICD-10-CM

## 2012-09-15 DIAGNOSIS — K5730 Diverticulosis of large intestine without perforation or abscess without bleeding: Secondary | ICD-10-CM

## 2012-09-15 DIAGNOSIS — J4489 Other specified chronic obstructive pulmonary disease: Secondary | ICD-10-CM

## 2012-09-15 LAB — CBC WITH DIFFERENTIAL/PLATELET
Basophils Relative: 0.8 % (ref 0.0–3.0)
Eosinophils Absolute: 0.1 10*3/uL (ref 0.0–0.7)
Eosinophils Relative: 0.9 % (ref 0.0–5.0)
HCT: 40.2 % (ref 39.0–52.0)
Hemoglobin: 13.6 g/dL (ref 13.0–17.0)
MCHC: 33.8 g/dL (ref 30.0–36.0)
MCV: 89.1 fl (ref 78.0–100.0)
Monocytes Absolute: 0.4 10*3/uL (ref 0.1–1.0)
Neutro Abs: 3.5 10*3/uL (ref 1.4–7.7)
Neutrophils Relative %: 64.5 % (ref 43.0–77.0)
RBC: 4.51 Mil/uL (ref 4.22–5.81)
WBC: 5.4 10*3/uL (ref 4.5–10.5)

## 2012-09-15 LAB — LIPID PANEL
Cholesterol: 140 mg/dL (ref 0–200)
HDL: 39.2 mg/dL (ref >39.00)
LDL Cholesterol: 90 mg/dL (ref 0–99)
Total CHOL/HDL Ratio: 4
Triglycerides: 56 mg/dL (ref 0.0–149.0)
VLDL: 11.2 mg/dL (ref 0.0–40.0)

## 2012-09-15 LAB — BASIC METABOLIC PANEL
BUN: 16 mg/dL (ref 6–23)
Calcium: 9.3 mg/dL (ref 8.4–10.5)
Chloride: 105 mEq/L (ref 96–112)
Creatinine, Ser: 0.8 mg/dL (ref 0.4–1.5)
GFR: 96 mL/min (ref >60.00)

## 2012-09-15 LAB — HEMOGLOBIN A1C: Hgb A1c MFr Bld: 7.5 % — ABNORMAL HIGH (ref 4.6–6.5)

## 2012-09-15 LAB — HEPATIC FUNCTION PANEL
Bilirubin, Direct: 0.1 mg/dL (ref 0.0–0.3)
Total Bilirubin: 0.7 mg/dL (ref 0.3–1.2)

## 2012-09-15 MED ORDER — HYOSCYAMINE SULFATE 0.125 MG SL SUBL: 0.1250 mg | SUBLINGUAL_TABLET | SUBLINGUAL | Status: DC | PRN

## 2012-09-15 MED ORDER — NIACIN ER (ANTIHYPERLIPIDEMIC) 500 MG PO TBCR: 500.0000 mg | EXTENDED_RELEASE_TABLET | Freq: Every day | ORAL | Status: DC

## 2012-09-15 MED ORDER — FLUTICASONE-SALMETEROL 115-21 MCG/ACT IN AERO: 1.0000 | INHALATION_SPRAY | Freq: Two times a day (BID) | RESPIRATORY_TRACT | Status: DC

## 2012-09-15 MED ORDER — TRAMADOL HCL 50 MG PO TABS: 50.0000 mg | ORAL_TABLET | Freq: Three times a day (TID) | ORAL | Status: DC | PRN

## 2012-09-15 MED ORDER — METFORMIN HCL 500 MG PO TABS: 500.0000 mg | ORAL_TABLET | Freq: Two times a day (BID) | ORAL | Status: DC

## 2012-09-15 MED ORDER — PANTOPRAZOLE SODIUM 40 MG PO TBEC: 40.0000 mg | DELAYED_RELEASE_TABLET | Freq: Every day | ORAL | Status: DC

## 2012-09-15 MED ORDER — MOMETASONE FUROATE 50 MCG/ACT NA SUSP: 2.0000 | NASAL | Status: DC | PRN

## 2012-09-15 MED ORDER — GLUCOSE BLOOD VI STRP: ORAL_STRIP | Status: DC

## 2012-09-15 MED ORDER — ATENOLOL 25 MG PO TABS: 25.0000 mg | ORAL_TABLET | Freq: Every day | ORAL | Status: DC

## 2012-09-15 NOTE — Progress Notes (Signed)
Subjective:    Patient ID: Jesse Santos, male    DOB: December 23, 1935, 77 y.o.   MRN: [id number]  HPI 77 y/o WM w/ mult med problems here for a follow up visit>   ~  January 02, 2011:  519mo ROV & he notes lots of stress from dental problems etc; notes his BP has been up & he saw DrHochrein w/ Norvasc prescribed but he states this "stopped my urine flow- same thing happened to a guy at church"; now on Diovan 320mg /d & BP is improved> EKG was normal, he has a Myoview sched soon for screening eval...  Lipids remain adeq controlled on his Niacin;  DM control fair on his Metformin monotherapy (A1c=7.3 & may need additional medication) but he hasn't yet lost any wt & we reviewe3d diet & exercise recommendations;  He is c/o some dysphagia w/ "food stopping on occas"> takes Protonix, Miralax, Levsin- and we discussed f/u eval from DrPerry to check his esoph;  LABS today showed elev PSA 1.0==>4.9 over the last yr (we called in Doxy x14d w/ repeat PSA 19mo & refer to Urology if not back to norm)...  ~  Feb 05, 2011:  19mo ROV & BP remains well controlled on the Diovan320mg /d, he claims that Norvasc and HCTZ "both stopped my urine flow";  He had elev PSA at 4.9 & was treated w/ Doxy x10d> repeat PSA back to normal= 1.30;  He is sched for colonoscopy next wk w/ DrPerry... We reviewed his labs.  ~  August 14, 2011:  519mo ROV & he saw TP ~819mo ago w/ URI/ bronchitis, treated w/ Augmentin, Mucinex, Tylenol & improved;  His COPD is controlled w/ Advair, Proair, Mucinex & we reviewed regular dosing;  BP controlled on Diovan but he refuses the 320 dose saying it caused HA & he knows that it can elev the BS too; he decr to 1/2 the dose & says that his BP is ok on this dose; he denies CP, palpit, dizzy, SOB, edema...  See prob list below>>  ~  Feb 11, 2012:  519mo ROV & Gala Romney reports a broken bone in right hand from playing w/ the dog- in short arm cast now (DrDuda);  He is in the donut hole & requesting any med changes to generics  & we reviewed this esp as it relates to very low cash pay meds;  We decided to change Niaspan to OTC slow release Niacin, and change Diovan160 to Losartan100 & he will follow BP at home after the transition...    He saw DrHochrein 4/13 after being in the ER 11/29/11 for AtypCP> Myoview was neg- no ischemia or infarct, EF=71%; he has been walking daily & no CP, recommended to stay active & get weight down...    He had recent f/u DrShapiro for Ophthalmology> s/p cats, no DM retinopathy seen...    We reviewed prob list, meds, xrays and labs> see below>> CXR 3/13 showed normal heart size, clear lungs, NAD.Marland KitchenMarland Kitchen EKG 3/13 showed NSR, rate75, WNL, NAD... LABS 3/13:  Chems- ok x BS=232;  CBC- wnl... LABS 5/13:  FLP- at goals on Niacin alone;  Chems- ok x BS=132 A1c=7.1 on MetformBid;  TSH=1.42;  PSA=1.04...  ~  September 15, 2012:  68mo ROV & Gala Romney is c/o some pain in his neck=> we will Rx w/ Tramadol for prn use;  He also notes that his pulse seems "higher" since he started the Losartan, pulse= 87 reg today, BP=140/84, and we discussed adding Aten25mg /d to his  med regimen...  We reviewed the following medical problems during today's office visit>>     HOH> he has hearing aides...    AR> on allergy shots, OTC antihist, saline, Nasonex; gets occas bouts of sinusitis but iproved this past yr...    OSA> Sleep Study was in 204 w/ RDI=45 & desat to 88% w/ mod snoring & leg jerks, but he states he never could use the CPAP effectively & he sleeps fine if he stays on his side; denies daytime sleepiness or other issues...    COPD> exsmoker quit 1981; on AdvairHFA115-2spBid, Mucinex prn; he doesn't like Pred Rx; he had Bronchitic exac treated by TP 12/13 w/ Augmentin, Mucinex, etc & improved; needs incr exercise program...    HBP> on Losar100; BP=140/84, he notes incr HR he says & we decided to add low dose ATEN25mg /d...    CAD> on ASA81; followed by DrHochrein- CT Abd 2010 showed coronary calcif, Treadmill was neg; Myoview  3/13 was neg, wnl...    Hyperlipid> on Niacin500mg ; FLP showed TChol 140, TG 56, HDL 39, LDL 90; continue same, get wt down...    DM> on Metform500Bid; Labs showed BS=134, A1c=7.5; wt=228# (no change) rec to continue same med, better diet, get wt down.[name]    GI- GERD, dysphagia, Divertics, IBS, Polyps> on Protonix40, Miralax, Levsin0.125 prn; followed by DrPerry & stable- last colon 5/12 w/ severe divertics & 2 sm adenomas removed...    GU- Kid stone, BPH> on Flomax0.4 & Cialis prn; PSA remains wnl at 1.10    DJD, LBP> left knee complaints w/ shots from DrDuda, similar for left shoulder; hx bilat hip AVN noted on prev CT Abd but he has min complaints and uses Tylenol prn...    Anxiety> he does not want anxiolytic meds.. We reviewed prob list, meds, xrays and labs> see below for updates >> he had the 2013 Flu vaccine 9/13... Meds refilled today per request... LABS 1/14:  FLP- at goals on Niacin rx;  Chems- ok w/ BS=134, A1c=7.5;  CBC- wnl;  TSH=1.13;  VitD=42;  PSA=1.10             Problem List:  HEARING LOSS (ICD-389.9) - he has bilat hearing aides...  ALLERGIC RHINITIS (ICD-477.9) - on allergy shots from DrESL Laney Pastor)... plus Claritin, Saline, Mucinex, Flonase. ~  occas bouts of sinusitis requiring antibiotic Rx... ~  8/13: he had allergy f/u DrVanWinkle> skin testing 2010 pos to dust mites and molds; doing well on shots once/mo; stable & no changes made...  OBSTRUCTIVE SLEEP APNEA (ICD-327.23) - sleep study 8/04 showed RDI= 45 & desat to 88%... mod snoring and leg jerks without sleep disruption... CPAP perscribed but not using it now- states he can't sleep w/ it on & furthermore he is resting well as long as he stays on his side... denies daytime hypersomnolence & not interested in re-evaluation.  COPD (ICD-496) - ex-smoker, quit 1981... on ADVAIR 100Bid, PROAIR Prn, MUCINEX Bid... he had Pneumovax in 2008... doesn't like Pred Rx...  ~  10/11:  doing well except recent cough, min green sputum  (no change in dyspnea)- we will Rx w/ Augmentin, incr Mucinex/ Fluids. ~  4/12:  Breathing at baseline, continue maintenance meds... ~  CXR 12/13 showed normal heart size, clear lungs, NAD... ~  1/14:  He's had 2 exac thios past yr- saw TP w/ Rx & improved...  HYPERTENSION (ICD-401.9) - prev on diet alone> then on Diovan320, but we decided to change to LOSARTAN 100mg /d 5/13 to save $$ ~  He claims that Norvasc & HCTZ "stopped my urine flow"... ~  10/11:  BP= 128/80 & even better at home he says... denies HA, fatigue, visual changes, CP, palipit, dizziness, syncope, dyspnea, edema, etc... ~  4/12:  Pt has seen TP, DrHochrein, & checking BP daily at home; tried on Norvasc but claims it decr his urine flow; now on Diovan & improved w/ BP= 132/70... ~  5/12:  BP remains well controlled on Diovan 320mg /d= 120/62 today. ~  11/12:  BP= 142/82 on Diovan160 now; denies CP, palpit, dizzy, SOB, edema... ~  5/13:  BP= 128/66 & he's requesting change to cheaper med; rec switch Diovan to LOSARTAN 100mg /d... ~  1/14: on Losar100; BP=140/84, he notes incr HR he says & we decided to add low dose ATENOLOL25mg /d...  R/O CAD (ICD-414.00) - on ASA 81mg /d... Followed by DrHochrein & his notes are reviewed. ~  hx neg cardiac cath 1988 by DrJoe [place]... ~  NuclearStressTest 10/02 was norm- no scar or ischemia, EF=55%. ~  CT Abd 4/10 via ER showed coronary calcif... referred to Cards. ~  Eval by DrHochrein 6/10 showed norm EKG & cardiac exam... treadmill ordered. ~  Treadmill 6/10 showed reasonable exerc tolerance, no ischemic changes, few PVCs, sl incr BP... ~  EKG 3/13 showed NSR, rate76, WNL, NAD... ~  Myoview 3/13 was neg> exercised for , stopped for fatigue & CP; no ST seg changes, occas PACs & PVCs; hypertensive BP response; no ischemia, EF=71%, normal wall motion...  MIXED HYPERLIPIDEMIA (ICD-272.2) - low HDL and NIASPAN 500mg /d started 4/09... ~  FLP 4/09 showed TChol 160, TG 106, HDL 26, LDL 113...  rec- diet + Niaspan 500/d. ~  FLP 10/09 showed TChol 155, TG 78, HDL 33, LDL 106... ~  FLP 4/10 showed TChol 133, TG 63, HDL 30, LDL 91... rec> continue same. ~  FLP 4/11 showed TChol 144, TG 61, HDL 38, LDL 94 ~  FLP 10/11 showed TChol 155, TG 81, HDL 40, LDL 99 ~  FLP 4/12 showed TChol 142, TG 69, HDL 38, LDL 90 ~  FLP 11/12 showed TChol 165, TG 82, HDL 43, LDL 106 ~  FLP 5/13 in Niasp500 showed TChol 155, TG 72, HDL 46, LDL 95... rec change to generic. ~  FLP 1/14 on Niacin500 showed TChol 140, TG 56, HDL 39, LDL 90  DM (ICD-250.00) - on diet + METFORMIN 500mg Bid... he reports BS at home all in the 120-130's... ~  labs 4/09 showed BS= 132, HgA1c= 6.6.Marland KitchenMarland Kitchen rec- same med, better diet... ~  labs 10/09 showed BS= 131, HgA1c= 6.3.Marland KitchenMarland Kitchen ~  labs 4/10 showed BS= 117, HgA1c= 6.2.[name]. rec> same meds/ diet Rx. ~  labs 10/10 showed BS= 138, A1c= 6.5 ~  labs 4/11 showed BS= 121, A1c= 6.5.Marland KitchenMarland Kitchen On Metform500Bid + diet. ~  Dilated eye exam 5/11 by DrShapiro- no retinopathy... ~  labs 10/11 (wt=228#) showed BS= 118, A1c= 7.1.[name]. not as good- get on diet or more meds. ~  Labs 4/12 (wt=229#) showed BS= 119, A1c= 7.3... Wrong direction, may need more meds, get wt down! ~  5/12:  Ophthalmology check by DrShapiro was neg- no retinopathy... ~  Labs 11/12 showed BS= 136, A1c= 7.0 ~  Labs 5/13 on MetformBid showed BS= 132, A1c= 7.1.Marland KitchenMarland Kitchen Continue same, get wt down. ~  Ophthalmology check from DrShapiro 5/13> no DM retinopathy... ~  Labs 1/14 showed BS= 134, A1c= 7.5; not as good- needs better diet, get wt down, same meds for now...  GERD (ICD-530.81) & ?  GASTROPARESIS - on PROTONIX 40mg /d, & off Reglan per DrPerry... ~   last EGD 6/07 by DrPerry showed gastic polyp... ~  8/10:  given Compazine suppos for gastroparesis flair by DrGessner. ~  4/12:  C/o intermittent dysphagia & referred back to GI for further eval (colonoscopy due as well)...  DIVERTICULOSIS OF COLON (ICD-562.10) - he uses MIRALAX Bid  regularly... IRRITABLE BOWEL SYNDROME (ICD-564.1) COLONIC POLYPS (ICD-211.3) ~  last colonoscopy 5/07 by DrSam showed divertics only... f/u 60yrs. ~  CT Abd 4/10 in ER showed atx bases, coronary calcif, s/p GB, abdAo calcif, 11mm renal stone, divertics, bilat hip AVN. ~  Follow up colonoscopy by DrPerry 5/12 showed severe diverticulosis, 2 sm polyps= tubular adenoma & f/u planned 5 yrs...  RENAL CALCULUS, HX OF (ICD-V13.01) BENIGN PROSTATIC HYPERTROPHY, HX OF (ICD-V13.8) - on FLOMAX 0.4mg /d w/ improved symptoms (he states flow better since stopping Vit E supplement). ~  4/12:  Routine PSA= 4.9 (it was 1.02 4/11) & we rx w/ Doxy Bid x14d, thewn plan recheck PSA==> 1.30  DEGENERATIVE JOINT DISEASE (ICD-715.90) - pt states "I have a bum knee" and eval by DrDuda w/ viscosupplementation shots... Note: Abd CT w/ bilat hip AVN noted... takes TYLENOL Prn... ~  11/10:  s/p left knee arthroscopy by DrDuda... ~  10/11:  s/p fall w/ left knee injury- s/p cortisone shot by DrDuda; hx shot in left shoulder too.  BACK PAIN, LUMBAR (ICD-724.2)  ANXIETY (ICD-300.00) - not currently on meds for nerves.  ANEMIA (ICD-285.9) - prev hx of GIB... 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  Health Maintenance: ~  GI: followed by DrPerry> Colonoscopy 5/07 by DrSam... f/u planned 5 yrs. ~  GU:  See PSAs recorded above... ~  Immunizations: Pneumovax in 2008 @ age 73;  Tetanus- given 4/10;  he gets yearly seasonal Flu vaccine.   Past Surgical History  Procedure Date  . Cholecystectomy   . Cataract extraction   . Skin cancer excision 3/10    left hand   . Knee surgery 11/10    Outpatient Encounter Prescriptions as of 09/15/2012  Medication Sig Dispense Refill  . aspirin 81 MG tablet Take 81 mg by mouth daily.        . Cholecalciferol (VITAMIN D) 2000 UNITS CAPS Take 1 capsule  by mouth daily.        . COMPRO 25 MG suppository PLACE 1 SUPPOSITORY (25 MG TOTAL) RECTALLY EVERY 12 (TWELVE) HOURS AS NEEDED FOR NAUSEA.  12 suppository  0  . dextromethorphan-guaiFENesin (MUCINEX DM) 30-600 MG per 12 hr tablet Take 1 tablet by mouth every 12 (twelve) hours.      . fluticasone-salmeterol (ADVAIR HFA) 115-21 MCG/ACT inhaler Inhale 1 puff into the lungs 2 (two) times daily.      . hyoscyamine (LEVSIN SL) 0.125 MG SL tablet Place 0.125 mg under the tongue every 4 (four) hours as needed. As needed for abdominal cramping       . losartan (COZAAR) 100 MG tablet Take 1 tablet (100 mg total) by mouth daily.  90 tablet  3  . metFORMIN (GLUCOPHAGE) 500 MG tablet Take 1 tablet (500 mg total) by mouth 2 (two) times daily with a meal.  60 tablet  11  . mometasone (NASONEX) 50 MCG/ACT nasal spray Place 2 sprays into the nose as  needed.      . Multiple Vitamins-Minerals (MULTIVITAMIN WITH MINERALS) tablet Take 1 tablet by mouth daily.        [name] NIASPAN 500 MG CR tablet TAKE 1 TABLET BY MOUTH AT BEDTIME  30 tablet  1  . nitroGLYCERIN (NITROSTAT) 0.4 MG SL tablet Place 1 tablet (0.4 mg total) under the tongue every 5 (five) minutes as needed for chest pain.  25 tablet  3  . NON FORMULARY Inject as directed every 30 (thirty) days. Allergy shot      . ONE TOUCH ULTRA TEST test strip USE AS DIRECTED  100 each  2  . pantoprazole (PROTONIX) 40 MG tablet TAKE 1 TABLET BY MOUTH EVERY DAY  30 tablet  1  . polyethylene glycol powder (GLYCOLAX/MIRALAX) powder TAKE 17 GRAMS BY MOUTH TWICE A DAY AS DIRECTED  1054 g  7  . sodium chloride (OCEAN) 0.65 % nasal spray 1 spray by Nasal route as needed.        . Tamsulosin HCl (FLOMAX) 0.4 MG CAPS TAKE ONE CAPSULE EVERY DAY  30 capsule  11  . triamcinolone (KENALOG) 0.1 % cream Apply topically 2 (two) times daily. As directed       . tadalafil (CIALIS) 20 MG tablet Take 1 tablet (20 mg total) by mouth daily as needed for erectile dysfunction.  10 tablet  12  .  [DISCONTINUED] sildenafil (VIAGRA) 50 MG tablet Take 1 tablet (50 mg total) by mouth daily as needed for erectile dysfunction.  10 tablet  2    Allergies  Allergen Reactions  . Prednisone     REACTION: high dose intolerance    Current Medications, Allergies, Past Medical History, Past Surgical History, Family History, and Social History were reviewed in [employer] record.    Review of Systems         See HPI - all other systems neg except as noted... The patient complains of decreased hearing, dyspnea on exertion, headaches, and difficulty walking.  The patient denies anorexia, fever, weight loss, weight gain, vision loss, hoarseness, chest pain, syncope, peripheral edema, prolonged cough, hemoptysis, abdominal pain, melena, hematochezia, severe indigestion/heartburn, hematuria, incontinence, muscle weakness, suspicious skin lesions, transient blindness, depression, unusual weight change, abnormal bleeding, enlarged lymph nodes, and angioedema.     Objective:   Physical Exam      WD, WN, 77 y/o WM in NAD... GENERAL:  Alert & oriented; pleasant & cooperative... HEENT:  [place]/AT, EOM-full, PERRLA, EACs-clear, TMs-wnl, NOSE- pale, congested, THROAT-clear & wnl. NECK:  Supple w/ fairROM; no JVD; normal carotid impulses w/o bruits; no thyromegaly or nodules palpated; no lymphadenopathy. CHEST:  Clear to P & A; without wheezes/ rales/ or rhonchi heard... HEART:  Regular Rhythm; without murmurs/ rubs/ or gallops detected...  ABDOMEN:  Soft & nontender; normal bowel sounds; no organomegaly or masses palpated., no guarding, or rebound EXT: without deformities, mild arthritic changes; no varicose veins/ venous insuffic/ or edema. NEURO:  CN's intact;  no focal neuro deficits... DERM:  No lesions noted; no rash etc...  RADIOLOGY DATA:  Reviewed in the EPIC EMR & discussed w/ the patient...  LABORATORY DATA:  Reviewed in the EPIC EMR & discussed w/ the  patient...   Assessment & Plan:    COPD>  He continues on Advair, Proair rescue, Mucinex; also Claritin, Flonase, Saline; stable continue same Rx...  HBP>  On LOSARTAN 100mg /d & adding low dose ATEN25 due to HR & BP response on treadmill...  R/O CAD>  Followed  by DrHochrein, and doing satis... continue ASA & risk factor reduction....  Lipids>  Looks satis on his diet + Niacin...  DM>  Control is fair w/ A1c stable at 7.5; he was told there are more meds in his future if he doesn't get his wt down (currently~230#)...  GI>  Per DrPerry w/ GERD, ?Gastroparesis, Divertics, IBS, Colon polyps> f/u colon 5/12 w/ divertics & 2 sm adenomas, repeat planned ~43yrs...  Anxiety>  Certainly an issue, he does not want anxiolytic Rx...   Patient's Medications  New Prescriptions   ATENOLOL (TENORMIN) 25 MG TABLET    Take 1 tablet (25 mg total) by mouth daily.   TRAMADOL (ULTRAM) 50 MG TABLET    Take 1 tablet (50 mg total) by mouth 3 (three) times daily as needed for pain.  Previous Medications   ASPIRIN 81 MG TABLET    Take 81 mg by mouth daily.     CHOLECALCIFEROL (VITAMIN D) 2000 UNITS CAPS    Take 1 capsule by mouth daily.     COMPRO 25 MG SUPPOSITORY    PLACE 1 SUPPOSITORY (25 MG TOTAL) RECTALLY EVERY 12 (TWELVE) HOURS AS NEEDED FOR NAUSEA.   DEXTROMETHORPHAN-GUAIFENESIN (MUCINEX DM) 30-600 MG PER 12 HR TABLET    Take 1 tablet by mouth every 12 (twelve) hours.   LOSARTAN (COZAAR) 100 MG TABLET    Take 1 tablet (100 mg total) by mouth daily.   MULTIPLE VITAMINS-MINERALS (MULTIVITAMIN WITH MINERALS) TABLET    Take 1 tablet by mouth daily.     NITROGLYCERIN (NITROSTAT) 0.4 MG SL TABLET    Place 1 tablet (0.4 mg total) under the tongue every 5 (five) minutes as needed for chest pain.   NON FORMULARY    Inject as directed every 30 (thirty) days. Allergy shot   POLYETHYLENE GLYCOL POWDER (GLYCOLAX/MIRALAX) POWDER    TAKE 17 GRAMS BY MOUTH TWICE A DAY AS DIRECTED   SODIUM CHLORIDE (OCEAN) 0.65 % NASAL  SPRAY    1 spray by Nasal route as needed.     TADALAFIL (CIALIS) 20 MG TABLET    Take 1 tablet (20 mg total) by mouth daily as needed for erectile dysfunction.   TAMSULOSIN HCL (FLOMAX) 0.4 MG CAPS    TAKE ONE CAPSULE EVERY DAY   TRIAMCINOLONE (KENALOG) 0.1 % CREAM    Apply topically 2 (two) times daily. As directed   Modified Medications   Modified Medication Previous Medication   FLUTICASONE-SALMETEROL (ADVAIR HFA) 115-21 MCG/ACT INHALER fluticasone-salmeterol (ADVAIR HFA) 115-21 MCG/ACT inhaler      Inhale 1 puff into the lungs 2 (two) times daily.    Inhale 1 puff into the lungs 2 (two) times daily.   GLUCOSE BLOOD (ONE TOUCH ULTRA TEST) TEST STRIP ONE TOUCH ULTRA TEST test strip      Use as instructed    USE AS DIRECTED   HYOSCYAMINE (LEVSIN SL) 0.125 MG SL TABLET hyoscyamine (LEVSIN SL) 0.125 MG SL tablet      Place 1 tablet (0.125 mg total) under the tongue every 4 (four) hours as needed. As needed for abdominal cramping    Place 0.125 mg under the tongue every 4 (four) hours as needed. As needed for abdominal cramping    METFORMIN (GLUCOPHAGE) 500 MG TABLET metFORMIN (GLUCOPHAGE) 500 MG tablet      Take 1 tablet (500 mg total) by mouth 2 (two) times daily with a meal.    Take 1 tablet (500 mg total) by mouth 2 (two) times daily with a  meal.   MOMETASONE (NASONEX) 50 MCG/ACT NASAL SPRAY mometasone (NASONEX) 50 MCG/ACT nasal spray      Place 2 sprays into the nose as needed.    Place 2 sprays into the nose as needed.   NIACIN (NIASPAN) 500 MG CR TABLET NIASPAN 500 MG CR tablet      Take 1 tablet (500 mg total) by mouth at bedtime.    TAKE 1 TABLET BY MOUTH AT BEDTIME   PANTOPRAZOLE (PROTONIX) 40 MG TABLET pantoprazole (PROTONIX) 40 MG tablet      Take 1 tablet (40 mg total) by mouth daily.    TAKE 1 TABLET BY MOUTH EVERY DAY  Discontinued Medications   SILDENAFIL (VIAGRA) 50 MG TABLET    Take 1 tablet (50 mg total) by mouth daily as needed for erectile dysfunction.

## 2012-09-15 NOTE — Patient Instructions (Addendum)
Today we updated your med list in our EPIC system...    Continue your current medications the same...    We refilled the meds you requested...  For your arthritis & pain> try the TRAMADOL 50mg - one tab up to 3 times daily...  For your BP 7 the "racing"> start ATENOLOL 25mg - one tab daily...  Today we did your follow up FASTING blood work...    We will contact you w/ the results when avail...  Call for any questions...  Let's continue our 6 month follow up visits.Marland KitchenMarland Kitchen

## 2012-09-16 LAB — VITAMIN D 25 HYDROXY (VIT D DEFICIENCY, FRACTURES): Vit D, 25-Hydroxy: 42 ng/mL (ref 30–89)

## 2012-10-16 ENCOUNTER — Other Ambulatory Visit: Payer: Self-pay | Admitting: Pulmonary Disease

## 2012-10-17 DIAGNOSIS — J309 Allergic rhinitis, unspecified: Secondary | ICD-10-CM | POA: Diagnosis not present

## 2012-10-18 ENCOUNTER — Telehealth: Payer: Self-pay | Admitting: Pulmonary Disease

## 2012-10-18 ENCOUNTER — Other Ambulatory Visit: Payer: Self-pay | Admitting: Pulmonary Disease

## 2012-10-18 MED ORDER — GLUCOSE BLOOD VI STRP: ORAL_STRIP | Status: DC

## 2012-10-18 NOTE — Telephone Encounter (Signed)
Rx has been sent to [facility] 

## 2012-11-03 ENCOUNTER — Other Ambulatory Visit: Payer: Self-pay | Admitting: *Deleted

## 2012-11-03 MED ORDER — GLUCOSE BLOOD VI STRP: ORAL_STRIP | Status: DC

## 2012-11-15 DIAGNOSIS — J309 Allergic rhinitis, unspecified: Secondary | ICD-10-CM | POA: Diagnosis not present

## 2012-12-01 ENCOUNTER — Telehealth: Payer: Self-pay | Admitting: Pulmonary Disease

## 2012-12-01 NOTE — Telephone Encounter (Signed)
Spoke with the pt He states that he is having a lot of dizziness and ear pain recently Feels off balance OV with SN at 10:30 am tommorrow

## 2012-12-01 NOTE — Telephone Encounter (Signed)
Pt called back Jesse Santos  

## 2012-12-02 ENCOUNTER — Ambulatory Visit (INDEPENDENT_AMBULATORY_CARE_PROVIDER_SITE_OTHER): Payer: Medicare Other | Admitting: Pulmonary Disease

## 2012-12-02 ENCOUNTER — Encounter: Payer: Self-pay | Admitting: Pulmonary Disease

## 2012-12-02 VITALS — BP 128/74 | HR 72 | Temp 97.4°F | Ht 74.0 in | Wt 229.8 lb

## 2012-12-02 DIAGNOSIS — J4489 Other specified chronic obstructive pulmonary disease: Secondary | ICD-10-CM

## 2012-12-02 DIAGNOSIS — M1990 Unspecified osteoarthritis, unspecified site: Secondary | ICD-10-CM

## 2012-12-02 DIAGNOSIS — M545 Low back pain, unspecified: Secondary | ICD-10-CM

## 2012-12-02 DIAGNOSIS — E782 Mixed hyperlipidemia: Secondary | ICD-10-CM

## 2012-12-02 DIAGNOSIS — J449 Chronic obstructive pulmonary disease, unspecified: Secondary | ICD-10-CM | POA: Diagnosis not present

## 2012-12-02 DIAGNOSIS — J069 Acute upper respiratory infection, unspecified: Secondary | ICD-10-CM | POA: Diagnosis not present

## 2012-12-02 DIAGNOSIS — I2510 Atherosclerotic heart disease of native coronary artery without angina pectoris: Secondary | ICD-10-CM | POA: Diagnosis not present

## 2012-12-02 DIAGNOSIS — J019 Acute sinusitis, unspecified: Secondary | ICD-10-CM

## 2012-12-02 DIAGNOSIS — E119 Type 2 diabetes mellitus without complications: Secondary | ICD-10-CM

## 2012-12-02 DIAGNOSIS — J309 Allergic rhinitis, unspecified: Secondary | ICD-10-CM

## 2012-12-02 DIAGNOSIS — F411 Generalized anxiety disorder: Secondary | ICD-10-CM

## 2012-12-02 DIAGNOSIS — I10 Essential (primary) hypertension: Secondary | ICD-10-CM

## 2012-12-02 MED ORDER — AZITHROMYCIN 250 MG PO TABS: ORAL_TABLET | ORAL | Status: DC

## 2012-12-02 MED ORDER — METHYLPREDNISOLONE ACETATE 80 MG/ML IJ SUSP
80.0000 mg | Freq: Once | INTRAMUSCULAR | Status: AC
  Administered 2012-12-02: 80 mg via INTRAMUSCULAR

## 2012-12-02 MED ORDER — METHYLPREDNISOLONE 4 MG PO KIT: PACK | ORAL | Status: DC

## 2012-12-02 NOTE — Patient Instructions (Addendum)
Today we updated your med list in our EPIC system...    Continue your current medications the same...  We wrote for a ZPak & Medrol dosepak to treat your upper resp infection & sinuses...  You may use the OTC BONINE for dizziness if necessary...  Call for any questions.Marland KitchenMarland Kitchen

## 2012-12-02 NOTE — Progress Notes (Signed)
Subjective:    Patient ID: Jesse Santos, male    DOB: 1935/10/12, 77 y.o.   MRN: [id number]  HPI 77 y/o WM w/ mult med problems here for a follow up visit>   ~  August 14, 2011:  5375mo ROV & he saw TP ~25375mo ago w/ URI/ bronchitis, treated w/ Augmentin, Mucinex, Tylenol & improved;  His COPD is controlled w/ Advair, Proair, Mucinex & we reviewed regular dosing;  BP controlled on Diovan but he refuses the 320 dose saying it caused HA & he knows that it can elev the BS too; he decr to 1/2 the dose & says that his BP is ok on this dose; he denies CP, palpit, dizzy, SOB, edema...  See prob list below>>  ~  Feb 11, 2012:  5375mo ROV & Jesse Santos reports a broken bone in right hand from playing w/ the dog- in short arm cast now (DrDuda);  He is in the donut hole & requesting any med changes to generics & we reviewed this esp as it relates to very low cash pay meds;  We decided to change Niaspan to OTC slow release Niacin, and change Diovan160 to Losartan100 & he will follow BP at home after the transition...    He saw DrHochrein 4/13 after being in the ER 11/29/11 for AtypCP> Myoview was neg- no ischemia or infarct, EF=71%; he has been walking daily & no CP, recommended to stay active & get weight down...    He had recent f/u DrShapiro for Ophthalmology> s/p cats, no DM retinopathy seen...    We reviewed prob list, meds, xrays and labs> see below>> CXR 3/13 showed normal heart size, clear lungs, NAD.Marland KitchenMarland Kitchen EKG 3/13 showed NSR, rate75, WNL, NAD... LABS 3/13:  Chems- ok x BS=232;  CBC- wnl... LABS 5/13:  FLP- at goals on Niacin alone;  Chems- ok x BS=132 A1c=7.1 on MetformBid;  TSH=1.42;  PSA=1.04...  ~  September 15, 2012:  75mo ROV & Jesse Santos is c/o some pain in his neck=> we will Rx w/ Tramadol for prn use;  He also notes that his pulse seems "higher" since he started the Losartan, pulse= 87 reg today, BP=140/84, and we discussed adding Aten25mg /d to his med regimen...  We reviewed the following medical problems during  today's office visit>>     HOH> he has hearing aides...    AR> on allergy shots, OTC antihist, saline, Nasonex; gets occas bouts of sinusitis but iproved this past yr...    OSA> Sleep Study was in 204 w/ RDI=45 & desat to 88% w/ mod snoring & leg jerks, but he states he never could use the CPAP effectively & he sleeps fine if he stays on his side; denies daytime sleepiness or other issues...    COPD> exsmoker quit 1981; on AdvairHFA115-2spBid, Mucinex prn; he doesn't like Pred Rx; he had Bronchitic exac treated by TP 12/13 w/ Augmentin, Mucinex, etc & improved; needs incr exercise program...    HBP> on Losar100; BP=140/84, he notes incr HR he says & we decided to add low dose ATEN25mg /d...    CAD> on ASA81; followed by DrHochrein- CT Abd 2010 showed coronary calcif, Treadmill was neg; Myoview 3/13 was neg, wnl...    Hyperlipid> on Niacin500mg ; FLP showed TChol 140, TG 56, HDL 39, LDL 90; continue same, get wt down...    DM> on Metform500Bid; Labs showed BS=134, A1c=7.5; wt=228# (no change) rec to continue same med, better diet, get wt down.[name]    GI- GERD, dysphagia, Divertics, IBS,  Polyps> on Protonix40, Miralax, Levsin0.125 prn; followed by DrPerry & stable- last colon 5/12 w/ severe divertics & 2 sm adenomas removed...    GU- Kid stone, BPH> on Flomax0.4 & Cialis prn; PSA remains wnl at 1.10    DJD, LBP> left knee complaints w/ shots from DrDuda, similar for left shoulder; hx bilat hip AVN noted on prev CT Abd but he has min complaints and uses Tylenol prn...    Anxiety> he does not want anxiolytic meds.. We reviewed prob list, meds, xrays and labs> see below for updates >> he had the 2013 Flu vaccine 9/13... Meds refilled today per request... LABS 1/14:  FLP- at goals on Niacin rx;  Chems- ok w/ BS=134, A1c=7.5;  CBC- wnl;  TSH=1.13;  VitD=42;  PSA=1.10      ~  December 02, 2012:  2-9mo ROV & add-on for 1wk hx right ear pain, dizzy, gums sore, aching, HA w/ pressure sensation;  Exam shows afeb, ears  clear, TMs ok, sl pharyngeal erythema w/o exud, chest w/ scat rhonchi;  We decided to treat w/ Depo80, Medrol dosepak, & ZPak...  He has Tramadol 7 Tylenol to use for pain... We reviewed prob list, meds, xrays and labs> see below for updates >>         Problem List:  HEARING LOSS (ICD-389.9) - he has bilat hearing aides...  ALLERGIC RHINITIS (ICD-477.9) - on allergy shots from DrESL Laney Pastor)... plus Claritin, Saline, Mucinex, Flonase. ~  occas bouts of sinusitis requiring antibiotic Rx... ~  8/13: he had allergy f/u DrVanWinkle> skin testing 2010 pos to dust mites and molds; doing well on shots once/mo; stable & no changes made... ~  3/14: he presented w/ an upper resp infection & treated w/ Depo, Dosepak, Zithromax...  OBSTRUCTIVE SLEEP APNEA (ICD-327.23) - sleep study 8/04 showed RDI= 45 & desat to 88%... mod snoring and leg jerks without sleep disruption... CPAP perscribed but not using it now- states he can't sleep w/ it on & furthermore he is resting well as long as he stays on his side... denies daytime hypersomnolence & not interested in re-evaluation.  COPD (ICD-496) - ex-smoker, quit 1981... on ADVAIR 100Bid, PROAIR Prn, MUCINEX Bid... he had Pneumovax in 2008... doesn't like Pred Rx...  ~  10/11:  doing well except recent cough, min green sputum (no change in dyspnea)- we will Rx w/ Augmentin, incr Mucinex/ Fluids. ~  4/12:  Breathing at baseline, continue maintenance meds... ~  CXR 12/13 showed normal heart size, clear lungs, NAD... ~  1/14:  He's had 2 exac this past yr- saw TP w/ Rx & improved...  HYPERTENSION (ICD-401.9) - prev on diet alone> then on Diovan320, but we decided to change to LOSARTAN 100mg /d 5/13 to save $$ ~  He claims that Norvasc & HCTZ "stopped my urine flow"... ~  10/11:  BP= 128/80 & even better at home he says... denies HA, fatigue, visual changes, CP, palipit, dizziness, syncope, dyspnea, edema, etc... ~  4/12:  Pt has seen TP, DrHochrein, & checking BP  daily at home; tried on Norvasc but claims it decr his urine flow; now on Diovan & improved w/ BP= 132/70... ~  5/12:  BP remains well controlled on Diovan 320mg /d= 120/62 today. ~  11/12:  BP= 142/82 on Diovan160 now; denies CP, palpit, dizzy, SOB, edema... ~  5/13:  BP= 128/66 & he's requesting change to cheaper med; rec switch Diovan to LOSARTAN 100mg /d... ~  1/14: on Losar100; BP=140/84, he notes incr HR he says &  we decided to add low dose ATENOLOL25mg /d...  R/O CAD (ICD-414.00) - on ASA 81mg /d... Followed by DrHochrein & his notes are reviewed. ~  hx neg cardiac cath 1988 by DrJoe Hickman... ~  NuclearStressTest 10/02 was norm- no scar or ischemia, EF=55%. ~  CT Abd 4/10 via ER showed coronary calcif... referred to Cards. ~  Eval by DrHochrein 6/10 showed norm EKG & cardiac exam... treadmill ordered. ~  Treadmill 6/10 showed reasonable exerc tolerance, no ischemic changes, few PVCs, sl incr BP... ~  EKG 3/13 showed NSR, rate76, WNL, NAD... ~  Myoview 3/13 was neg> exercised for , stopped for fatigue & CP; no ST seg changes, occas PACs & PVCs; hypertensive BP response; no ischemia, EF=71%, normal wall motion...  MIXED HYPERLIPIDEMIA (ICD-272.2) - low HDL and NIASPAN 500mg /d started 4/09... ~  FLP 4/09 showed TChol 160, TG 106, HDL 26, LDL 113... rec- diet + Niaspan 500/d. ~  FLP 10/09 showed TChol 155, TG 78, HDL 33, LDL 106... ~  FLP 4/10 showed TChol 133, TG 63, HDL 30, LDL 91... rec> continue same. ~  FLP 4/11 showed TChol 144, TG 61, HDL 38, LDL 94 ~  FLP 10/11 showed TChol 155, TG 81, HDL 40, LDL 99 ~  FLP 4/12 showed TChol 142, TG 69, HDL 38, LDL 90 ~  FLP 11/12 showed TChol 165, TG 82, HDL 43, LDL 106 ~  FLP 5/13 in Niasp500 showed TChol 155, TG 72, HDL 46, LDL 95... rec change to generic. ~  FLP 1/14 on Niacin500 showed TChol 140, TG 56, HDL 39, LDL 90  DM (ICD-250.00) - on diet + METFORMIN 500mg Bid... he reports BS at home all in the 120-130's... ~  labs 4/09 showed BS=  132, HgA1c= 6.6.Marland KitchenMarland Kitchen rec- same med, better diet... ~  labs 10/09 showed BS= 131, HgA1c= 6.3.Marland KitchenMarland Kitchen ~  labs 4/10 showed BS= 117, HgA1c= 6.2.[name]. rec> same meds/ diet Rx. ~  labs 10/10 showed BS= 138, A1c= 6.5 ~  labs 4/11 showed BS= 121, A1c= 6.5.Marland KitchenMarland Kitchen On Metform500Bid + diet. ~  Dilated eye exam 5/11 by DrShapiro- no retinopathy... ~  labs 10/11 (wt=228#) showed BS= 118, A1c= 7.1.[name]. not as good- get on diet or more meds. ~  Labs 4/12 (wt=229#) showed BS= 119, A1c= 7.3... Wrong direction, may need more meds, get wt down! ~  5/12:  Ophthalmology check by DrShapiro was neg- no retinopathy... ~  Labs 11/12 showed BS= 136, A1c= 7.0 ~  Labs 5/13 on MetformBid showed BS= 132, A1c= 7.1.Marland KitchenMarland Kitchen Continue same, get wt down. ~  Ophthalmology check from DrShapiro 5/13> no DM retinopathy... ~  Labs 1/14 showed BS= 134, A1c= 7.5; not as good- needs better diet, get wt down, same meds for now...  GERD (ICD-530.81) & ? GASTROPARESIS - on PROTONIX 40mg /d, & off Reglan per DrPerry... ~   last EGD 6/07 by DrPerry showed gastic polyp... ~  8/10:  given Compazine suppos for gastroparesis flair by DrGessner. ~  4/12:  C/o intermittent dysphagia & referred back to GI for further eval (colonoscopy due as well)...  DIVERTICULOSIS OF COLON (ICD-562.10) - he uses MIRALAX Bid regularly... IRRITABLE BOWEL SYNDROME (ICD-564.1) COLONIC POLYPS (ICD-211.3) ~  last colonoscopy 5/07 by DrSam showed divertics only... f/u 57yrs. ~  CT Abd 4/10 in ER showed atx bases, coronary calcif, s/p GB, abdAo calcif, 11mm renal stone, divertics, bilat hip AVN. ~  Follow up colonoscopy by DrPerry 5/12 showed severe diverticulosis, 2 sm polyps= tubular adenoma & f/u planned 5 yrs...  RENAL  CALCULUS, HX OF (ICD-V13.01) BENIGN PROSTATIC HYPERTROPHY, HX OF (ICD-V13.8) - on FLOMAX 0.4mg /d w/ improved symptoms (he states flow better since stopping Vit E supplement). ~  4/12:  Routine PSA= 4.9 (it was 1.02 4/11) & we rx w/ Doxy Bid x14d, thewn plan recheck  PSA==> 1.30  DEGENERATIVE JOINT DISEASE (ICD-715.90) - pt states "I have a bum knee" and eval by DrDuda w/ viscosupplementation shots... Note: Abd CT w/ bilat hip AVN noted... takes TYLENOL Prn... ~  11/10:  s/p left knee arthroscopy by DrDuda... ~  10/11:  s/p fall w/ left knee injury- s/p cortisone shot by DrDuda; hx shot in left shoulder too.  BACK PAIN, LUMBAR (ICD-724.2)  ANXIETY (ICD-300.00) - not currently on meds for nerves.  ANEMIA (ICD-285.9) - prev hx of GIB... 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  Health Maintenance: ~  GI: followed by DrPerry> Colonoscopy 5/07 by DrSam... f/u planned 5 yrs. ~  GU:  See PSAs recorded above... ~  Immunizations: Pneumovax in 2008 @ age 53;  Tetanus- given 4/10;  he gets yearly seasonal Flu vaccine.   Past Surgical History  Procedure Laterality Date  . Cholecystectomy    . Cataract extraction    . Skin cancer excision  3/10    left hand   . Knee surgery  11/10    Outpatient Encounter Prescriptions as of 12/02/2012  Medication Sig Dispense Refill  . aspirin 81 MG tablet Take 81 mg by mouth daily.        [name] atenolol (TENORMIN) 25 MG tablet Take 1 tablet (25 mg total) by mouth daily.  30 tablet  11  . Cholecalciferol (VITAMIN D) 2000 UNITS CAPS Take 1 capsule by mouth daily.        . COMPRO 25 MG suppository PLACE 1 SUPPOSITORY (25 MG TOTAL) RECTALLY EVERY 12 (TWELVE) HOURS AS NEEDED FOR NAUSEA.  12 suppository  0  . dextromethorphan-guaiFENesin (MUCINEX DM) 30-600 MG per 12 hr tablet Take 1 tablet by mouth every 12 (twelve) hours.      . fluticasone-salmeterol (ADVAIR HFA) 115-21 MCG/ACT inhaler Inhale 1 puff into the lungs 2 (two) times daily.  1 Inhaler  11  . glucose blood (ONE TOUCH ULTRA TEST) test strip Use as instructed  100 each  6  . glucose blood (ONE TOUCH ULTRA TEST) test strip Test blood  glucose levels once a day.  DX:  250.00  100 each  6  . hyoscyamine (LEVSIN SL) 0.125 MG SL tablet Place 1 tablet (0.125 mg total) under the tongue every 4 (four) hours as needed. As needed for abdominal cramping  90 tablet  5  . losartan (COZAAR) 100 MG tablet Take 1 tablet (100 mg total) by mouth daily.  90 tablet  3  . metFORMIN (GLUCOPHAGE) 500 MG tablet Take 1 tablet (500 mg total) by mouth 2 (two) times daily with a meal.  60 tablet  11  . mometasone (NASONEX) 50 MCG/ACT nasal spray Place 2 sprays into the nose as needed.  17 g  11  . Multiple Vitamins-Minerals (MULTIVITAMIN WITH MINERALS) tablet Take 1 tablet by mouth daily.        . niacin (NIASPAN) 500 MG CR tablet Take 1 tablet (500 mg total) by mouth at bedtime.  30 tablet  11  . nitroGLYCERIN (NITROSTAT) 0.4  MG SL tablet Place 1 tablet (0.4 mg total) under the tongue every 5 (five) minutes as needed for chest pain.  25 tablet  3  . NON FORMULARY Inject as directed every 30 (thirty) days. Allergy shot      . pantoprazole (PROTONIX) 40 MG tablet Take 1 tablet (40 mg total) by mouth daily.  30 tablet  11  . polyethylene glycol powder (GLYCOLAX/MIRALAX) powder TAKE 17 GRAMS BY MOUTH TWICE A DAY AS DIRECTED  1054 g  7  . sodium chloride (OCEAN) 0.65 % nasal spray 1 spray by Nasal route as needed.        . tadalafil (CIALIS) 20 MG tablet Take 1 tablet (20 mg total) by mouth daily as needed for erectile dysfunction.  10 tablet  12  . Tamsulosin HCl (FLOMAX) 0.4 MG CAPS TAKE ONE CAPSULE EVERY DAY  30 capsule  11  . traMADol (ULTRAM) 50 MG tablet Take 1 tablet (50 mg total) by mouth 3 (three) times daily as needed for pain.  90 tablet  5  . triamcinolone (KENALOG) 0.1 % cream Apply topically 2 (two) times daily. As directed        No facility-administered encounter medications on file as of 12/02/2012.    Allergies  Allergen Reactions  . Prednisone     REACTION: high dose intolerance    Current Medications, Allergies, Past Medical  History, Past Surgical History, Family History, and Social History were reviewed in [employer] record.    Review of Systems         See HPI - all other systems neg except as noted... The patient complains of decreased hearing, dyspnea on exertion, headaches, and difficulty walking.  The patient denies anorexia, fever, weight loss, weight gain, vision loss, hoarseness, chest pain, syncope, peripheral edema, prolonged cough, hemoptysis, abdominal pain, melena, hematochezia, severe indigestion/heartburn, hematuria, incontinence, muscle weakness, suspicious skin lesions, transient blindness, depression, unusual weight change, abnormal bleeding, enlarged lymph nodes, and angioedema.     Objective:   Physical Exam      WD, WN, 77 y/o WM in NAD... GENERAL:  Alert & oriented; pleasant & cooperative... HEENT:  [place]/AT, EOM-full, PERRLA, EACs-clear, TMs-wnl, NOSE- pale, congested, THROAT-clear & wnl. NECK:  Supple w/ fairROM; no JVD; normal carotid impulses w/o bruits; no thyromegaly or nodules palpated; no lymphadenopathy. CHEST:  Clear to P & A; without wheezes/ rales/ or rhonchi heard... HEART:  Regular Rhythm; without murmurs/ rubs/ or gallops detected...  ABDOMEN:  Soft & nontender; normal bowel sounds; no organomegaly or masses palpated., no guarding, or rebound EXT: without deformities, mild arthritic changes; no varicose veins/ venous insuffic/ or edema. NEURO:  CN's intact;  no focal neuro deficits... DERM:  No lesions noted; no rash etc...  RADIOLOGY DATA:  Reviewed in the EPIC EMR & discussed w/ the patient...  LABORATORY DATA:  Reviewed in the EPIC EMR & discussed w/ the patient...   Assessment & Plan:  URI, Sinusitis> we decided to treat w/ Depo80, Medrol dosepak, Zithromax...   COPD>  He continues on Advair, Proair rescue, Mucinex; also Claritin, Flonase, Saline; stable continue same Rx...  HBP>  On LOSARTAN 100mg /d & adding low dose ATEN25 due to HR & BP  response on treadmill...  R/O CAD>  Followed by DrHochrein, and doing satis... continue ASA & risk factor reduction....  Lipids>  Looks satis on his diet + Niacin...  DM>  Control is fair w/ A1c stable at 7.5; he was told there are more meds in  his future if he doesn't get his wt down (currently~230#)...  GI>  Per DrPerry w/ GERD, ?Gastroparesis, Divertics, IBS, Colon polyps> f/u colon 5/12 w/ divertics & 2 sm adenomas, repeat planned ~40yrs...  Anxiety>  Certainly an issue, he does not want anxiolytic Rx...   Patient's Medications  New Prescriptions   AZITHROMYCIN (ZITHROMAX) 250 MG TABLET    Take as directed   METHYLPREDNISOLONE (MEDROL, PAK,) 4 MG TABLET    follow package directions  Previous Medications   ASPIRIN 81 MG TABLET    Take 81 mg by mouth daily.     ATENOLOL (TENORMIN) 25 MG TABLET    Take 1 tablet (25 mg total) by mouth daily.   CHOLECALCIFEROL (VITAMIN D) 2000 UNITS CAPS    Take 1 capsule by mouth daily.     COMPRO 25 MG SUPPOSITORY    PLACE 1 SUPPOSITORY (25 MG TOTAL) RECTALLY EVERY 12 (TWELVE) HOURS AS NEEDED FOR NAUSEA.   DEXTROMETHORPHAN-GUAIFENESIN (MUCINEX DM) 30-600 MG PER 12 HR TABLET    Take 1 tablet by mouth every 12 (twelve) hours.   FLUTICASONE-SALMETEROL (ADVAIR HFA) 115-21 MCG/ACT INHALER    Inhale 1 puff into the lungs 2 (two) times daily.   GLUCOSE BLOOD (ONE TOUCH ULTRA TEST) TEST STRIP    Use as instructed   GLUCOSE BLOOD (ONE TOUCH ULTRA TEST) TEST STRIP    Test blood glucose levels once a day.  DX:  250.00   HYOSCYAMINE (LEVSIN SL) 0.125 MG SL TABLET    Place 1 tablet (0.125 mg total) under the tongue every 4 (four) hours as needed. As needed for abdominal cramping   LOSARTAN (COZAAR) 100 MG TABLET    Take 1 tablet (100 mg total) by mouth daily.   METFORMIN (GLUCOPHAGE) 500 MG TABLET    Take 1 tablet (500 mg total) by mouth 2 (two) times daily with a meal.   MOMETASONE (NASONEX) 50 MCG/ACT NASAL SPRAY    Place 2 sprays into the nose as needed.    MULTIPLE VITAMINS-MINERALS (MULTIVITAMIN WITH MINERALS) TABLET    Take 1 tablet by mouth daily.     NIACIN (NIASPAN) 500 MG CR TABLET    Take 1 tablet (500 mg total) by mouth at bedtime.   NITROGLYCERIN (NITROSTAT) 0.4 MG SL TABLET    Place 1 tablet (0.4 mg total) under the tongue every 5 (five) minutes as needed for chest pain.   NON FORMULARY    Inject as directed every 30 (thirty) days. Allergy shot   PANTOPRAZOLE (PROTONIX) 40 MG TABLET    Take 1 tablet (40 mg total) by mouth daily.   POLYETHYLENE GLYCOL POWDER (GLYCOLAX/MIRALAX) POWDER    TAKE 17 GRAMS BY MOUTH TWICE A DAY AS DIRECTED   SODIUM CHLORIDE (OCEAN) 0.65 % NASAL SPRAY    1 spray by Nasal route as needed.     TADALAFIL (CIALIS) 20 MG TABLET    Take 1 tablet (20 mg total) by mouth daily as needed for erectile dysfunction.   TAMSULOSIN HCL (FLOMAX) 0.4 MG CAPS    TAKE ONE CAPSULE EVERY DAY   TRAMADOL (ULTRAM) 50 MG TABLET    Take 1 tablet (50 mg total) by mouth 3 (three) times daily as needed for pain.   TRIAMCINOLONE (KENALOG) 0.1 % CREAM    Apply topically 2 (two) times daily. As directed   Modified Medications   No medications on file  Discontinued Medications   No medications on file

## 2012-12-14 DIAGNOSIS — J309 Allergic rhinitis, unspecified: Secondary | ICD-10-CM | POA: Diagnosis not present

## 2013-01-02 ENCOUNTER — Encounter: Payer: Self-pay | Admitting: Cardiology

## 2013-01-02 ENCOUNTER — Encounter: Payer: Self-pay | Admitting: *Deleted

## 2013-01-02 ENCOUNTER — Ambulatory Visit (INDEPENDENT_AMBULATORY_CARE_PROVIDER_SITE_OTHER): Payer: Medicare Other | Admitting: Cardiology

## 2013-01-02 VITALS — BP 130/70 | HR 60 | Ht 74.0 in | Wt 230.0 lb

## 2013-01-02 DIAGNOSIS — I10 Essential (primary) hypertension: Secondary | ICD-10-CM | POA: Diagnosis not present

## 2013-01-02 DIAGNOSIS — I2510 Atherosclerotic heart disease of native coronary artery without angina pectoris: Secondary | ICD-10-CM

## 2013-01-02 MED ORDER — NITROGLYCERIN 0.4 MG SL SUBL: 0.4000 mg | SUBLINGUAL_TABLET | SUBLINGUAL | Status: DC | PRN

## 2013-01-02 NOTE — Progress Notes (Signed)
HPI The patient presents for  followup of coronary disease.he did have chest pain and stress testing last year. There was no evidence of ischemia Cardiolite. Since then he has had no new cardiovascular symptoms. He walks about 30 minutes a day. He denies any symptoms such as chest discomfort, neck or arm discomfort. There has been no new shortness of breath, PND or orthopnea. There have been no reported palpitations, presyncope or syncope.  Allergies  Allergen Reactions  . Prednisone     REACTION: high dose intolerance    Current Outpatient Prescriptions  Medication Sig Dispense Refill  . aspirin 81 MG tablet Take 81 mg by mouth daily.        [name] atenolol (TENORMIN) 25 MG tablet Take 1 tablet (25 mg total) by mouth daily.  30 tablet  11  . Cholecalciferol (VITAMIN D) 2000 UNITS CAPS Take 1 capsule by mouth daily.        . COMPRO 25 MG suppository PLACE 1 SUPPOSITORY (25 MG TOTAL) RECTALLY EVERY 12 (TWELVE) HOURS AS NEEDED FOR NAUSEA.  12 suppository  0  . dextromethorphan-guaiFENesin (MUCINEX DM) 30-600 MG per 12 hr tablet Take 1 tablet by mouth every 12 (twelve) hours.      . fluticasone-salmeterol (ADVAIR HFA) 115-21 MCG/ACT inhaler Inhale 1 puff into the lungs 2 (two) times daily.  1 Inhaler  11  . glucose blood (ONE TOUCH ULTRA TEST) test strip Use as instructed  100 each  6  . glucose blood (ONE TOUCH ULTRA TEST) test strip Test blood glucose levels once a day.  DX:  250.00  100 each  6  . hyoscyamine (LEVSIN SL) 0.125 MG SL tablet Place 1 tablet (0.125 mg total) under the tongue every 4 (four) hours as needed. As needed for abdominal cramping  90 tablet  5  . losartan (COZAAR) 100 MG tablet Take 1 tablet (100 mg total) by mouth daily.  90 tablet  3  . metFORMIN (GLUCOPHAGE) 500 MG tablet Take 1 tablet (500 mg total) by mouth 2 (two) times daily with a meal.  60 tablet  11  . mometasone (NASONEX) 50 MCG/ACT nasal spray Place 2 sprays into the nose as needed.  17 g  11  . Multiple  Vitamins-Minerals (MULTIVITAMIN WITH MINERALS) tablet Take 1 tablet by mouth daily.        . niacin (NIASPAN) 500 MG CR tablet Take 1 tablet (500 mg total) by mouth at bedtime.  30 tablet  11  . nitroGLYCERIN (NITROSTAT) 0.4 MG SL tablet Place 0.4 mg under the tongue every 5 (five) minutes as needed for chest pain.      . NON FORMULARY Inject as directed every 30 (thirty) days. Allergy shot      . pantoprazole (PROTONIX) 40 MG tablet Take 1 tablet (40 mg total) by mouth daily.  30 tablet  11  . polyethylene glycol powder (GLYCOLAX/MIRALAX) powder TAKE 17 GRAMS BY MOUTH TWICE A DAY AS DIRECTED  1054 g  7  . sodium chloride (OCEAN) 0.65 % nasal spray 1 spray by Nasal route as needed.        . tadalafil (CIALIS) 20 MG tablet Take 20 mg by mouth daily as needed for erectile dysfunction.      . Tamsulosin HCl (FLOMAX) 0.4 MG CAPS TAKE ONE CAPSULE EVERY DAY  30 capsule  11  . traMADol (ULTRAM) 50 MG tablet Take 1 tablet (50 mg total) by mouth 3 (three) times daily as needed for pain.  90 tablet  5  . triamcinolone (KENALOG) 0.1 % cream Apply topically 2 (two) times daily. As directed        No current facility-administered medications for this visit.    Past Medical History  Diagnosis Date  . Hearing loss   . Allergic rhinitis, cause unspecified   . Obstructive sleep apnea (adult) (pediatric)   . COPD (chronic obstructive pulmonary disease)   . Unspecified essential hypertension   . Mixed hyperlipidemia   . Coronary artery disease     Coronary calcification with negative ETT 2010  . Type II or unspecified type diabetes mellitus without mention of complication, not stated as uncontrolled   . Esophageal reflux   . Diverticulosis of colon (without mention of hemorrhage)   . Irritable bowel syndrome   . Benign neoplasm of colon   . Renal calculus   . BPH (benign prostatic hypertrophy)   . Degenerative joint disease   . Lumbar back pain   . Anxiety   . Anemia     Past Surgical History    Procedure Laterality Date  . Cholecystectomy    . Cataract extraction    . Skin cancer excision  3/10    left hand   . Knee surgery  11/10    ROS:  As stated in the HPI and negative for all other systems.  PHYSICAL EXAM BP 130/70  Pulse 60  Ht 6\' 2"  (1.88 m)  Wt 230 lb (104.327 kg)  BMI 29.52 kg/m2 GENERAL:  Well appearing NECK:  No jugular venous distention, waveform within normal limits, carotid upstroke brisk and symmetric, no bruits, no thyromegaly LUNGS:  Clear to auscultation bilaterally HEART:  PMI not displaced or sustained,S1 and S2 within normal limits, no S3, no S4, no clicks, no rubs, no murmurs ABD:  Flat, positive bowel sounds normal in frequency in pitch, no bruits, no rebound, no guarding, no midline pulsatile mass, no hepatomegaly, no splenomegaly EXT:  2 plus pulses throughout, no edema, no cyanosis no clubbing  EKG:  NSR, rate 60, axis within normal limits, intervals within normal limits, no acute ST-T wave changes. 01/02/2013  ASSESSMENT AND PLAN  CAD:  The patient has no new sypmtoms.  No further cardiovascular testing is indicated.  We will continue with aggressive risk reduction and meds as listed.  HTN:  The blood pressure is at target. No change in medications is indicated. We will continue with therapeutic lifestyle changes (TLC).

## 2013-01-02 NOTE — Patient Instructions (Signed)
Your physician wants you to follow-up in: 12 months with Dr. Hochrein. You will receive a reminder letter in the mail two months in advance. If you don't receive a letter, please call our office to schedule the follow-up appointment.  

## 2013-01-12 DIAGNOSIS — J309 Allergic rhinitis, unspecified: Secondary | ICD-10-CM | POA: Diagnosis not present

## 2013-01-25 ENCOUNTER — Other Ambulatory Visit: Payer: Self-pay | Admitting: Pulmonary Disease

## 2013-01-28 ENCOUNTER — Encounter (HOSPITAL_BASED_OUTPATIENT_CLINIC_OR_DEPARTMENT_OTHER): Payer: Self-pay | Admitting: *Deleted

## 2013-01-28 ENCOUNTER — Emergency Department (HOSPITAL_BASED_OUTPATIENT_CLINIC_OR_DEPARTMENT_OTHER)
Admission: EM | Admit: 2013-01-28 | Discharge: 2013-01-28 | Disposition: A | Discharge status: Home / Self Care | Payer: Medicare Other | Attending: Emergency Medicine | Admitting: Emergency Medicine

## 2013-01-28 DIAGNOSIS — X58XXXA Exposure to other specified factors, initial encounter: Secondary | ICD-10-CM | POA: Insufficient documentation

## 2013-01-28 DIAGNOSIS — Z8719 Personal history of other diseases of the digestive system: Secondary | ICD-10-CM | POA: Diagnosis not present

## 2013-01-28 DIAGNOSIS — E119 Type 2 diabetes mellitus without complications: Secondary | ICD-10-CM | POA: Diagnosis not present

## 2013-01-28 DIAGNOSIS — J449 Chronic obstructive pulmonary disease, unspecified: Secondary | ICD-10-CM | POA: Insufficient documentation

## 2013-01-28 DIAGNOSIS — F411 Generalized anxiety disorder: Secondary | ICD-10-CM | POA: Diagnosis not present

## 2013-01-28 DIAGNOSIS — Z8709 Personal history of other diseases of the respiratory system: Secondary | ICD-10-CM | POA: Diagnosis not present

## 2013-01-28 DIAGNOSIS — Z87891 Personal history of nicotine dependence: Secondary | ICD-10-CM | POA: Diagnosis not present

## 2013-01-28 DIAGNOSIS — Z7982 Long term (current) use of aspirin: Secondary | ICD-10-CM | POA: Diagnosis not present

## 2013-01-28 DIAGNOSIS — H9190 Unspecified hearing loss, unspecified ear: Secondary | ICD-10-CM | POA: Diagnosis not present

## 2013-01-28 DIAGNOSIS — I10 Essential (primary) hypertension: Secondary | ICD-10-CM | POA: Diagnosis not present

## 2013-01-28 DIAGNOSIS — K589 Irritable bowel syndrome without diarrhea: Secondary | ICD-10-CM | POA: Diagnosis not present

## 2013-01-28 DIAGNOSIS — IMO0002 Reserved for concepts with insufficient information to code with codable children: Secondary | ICD-10-CM | POA: Diagnosis not present

## 2013-01-28 DIAGNOSIS — Z8669 Personal history of other diseases of the nervous system and sense organs: Secondary | ICD-10-CM | POA: Insufficient documentation

## 2013-01-28 DIAGNOSIS — Z87442 Personal history of urinary calculi: Secondary | ICD-10-CM | POA: Insufficient documentation

## 2013-01-28 DIAGNOSIS — Z79899 Other long term (current) drug therapy: Secondary | ICD-10-CM | POA: Insufficient documentation

## 2013-01-28 DIAGNOSIS — Z862 Personal history of diseases of the blood and blood-forming organs and certain disorders involving the immune mechanism: Secondary | ICD-10-CM | POA: Diagnosis not present

## 2013-01-28 DIAGNOSIS — Y929 Unspecified place or not applicable: Secondary | ICD-10-CM | POA: Insufficient documentation

## 2013-01-28 DIAGNOSIS — I2510 Atherosclerotic heart disease of native coronary artery without angina pectoris: Secondary | ICD-10-CM | POA: Insufficient documentation

## 2013-01-28 DIAGNOSIS — K219 Gastro-esophageal reflux disease without esophagitis: Secondary | ICD-10-CM | POA: Diagnosis not present

## 2013-01-28 DIAGNOSIS — Z8739 Personal history of other diseases of the musculoskeletal system and connective tissue: Secondary | ICD-10-CM | POA: Insufficient documentation

## 2013-01-28 DIAGNOSIS — N400 Enlarged prostate without lower urinary tract symptoms: Secondary | ICD-10-CM | POA: Insufficient documentation

## 2013-01-28 DIAGNOSIS — E782 Mixed hyperlipidemia: Secondary | ICD-10-CM | POA: Insufficient documentation

## 2013-01-28 DIAGNOSIS — J4489 Other specified chronic obstructive pulmonary disease: Secondary | ICD-10-CM | POA: Insufficient documentation

## 2013-01-28 DIAGNOSIS — L02419 Cutaneous abscess of limb, unspecified: Secondary | ICD-10-CM | POA: Insufficient documentation

## 2013-01-28 DIAGNOSIS — T148XXA Other injury of unspecified body region, initial encounter: Secondary | ICD-10-CM

## 2013-01-28 DIAGNOSIS — L0390 Cellulitis, unspecified: Secondary | ICD-10-CM

## 2013-01-28 DIAGNOSIS — Z9889 Other specified postprocedural states: Secondary | ICD-10-CM | POA: Insufficient documentation

## 2013-01-28 DIAGNOSIS — Y939 Activity, unspecified: Secondary | ICD-10-CM | POA: Insufficient documentation

## 2013-01-28 MED ORDER — CEPHALEXIN 500 MG PO CAPS: 500.0000 mg | ORAL_CAPSULE | Freq: Four times a day (QID) | ORAL | Status: DC

## 2013-01-28 MED ORDER — CEPHALEXIN 250 MG PO CAPS
500.0000 mg | ORAL_CAPSULE | Freq: Once | ORAL | Status: AC
  Administered 2013-01-28: 500 mg via ORAL
  Filled 2013-01-28: qty 2

## 2013-01-28 NOTE — ED Notes (Signed)
Pt states he injured his left knee 2 weeks ago and it is not getting better. ?infected. Pt is a diabetic.

## 2013-01-28 NOTE — ED Provider Notes (Signed)
History    This chart was scribed for Lekeisha Arenas B. Bernette Mayers, MD by Quintella Reichert, ED scribe.  This patient was seen in room MH04/MH04 and the patient's care was started at 10:38 PM.   CSN: [id number]  Arrival date & time 01/28/13  2203      Chief Complaint  Patient presents with  . Wound Infection     The history is provided by the patient. No language interpreter was used.    HPI Comments: Jesse Santos is a 77 y.o. male with DM who presents to the Emergency Department complaining of a knee wound that he sustained 2 weeks ago and that is not healing.  Pt had the wound treated in ED the day that it occurred.  Pt states that he thought the wound was healing, but today he was taking a shower and noticed a discharge running off from the site of the wound.  He also notes that the wound had a scab over it but that the scab recently fell off.   He has been applying Neosporin and peroxide to the wound.  Pt denies fever, chills, nausea, emesis or any other associated symptoms.   Past Medical History  Diagnosis Date  . Hearing loss   . Allergic rhinitis, cause unspecified   . Obstructive sleep apnea (adult) (pediatric)   . COPD (chronic obstructive pulmonary disease)   . Unspecified essential hypertension   . Mixed hyperlipidemia   . Coronary artery disease     Coronary calcification with negative ETT 2010  . Type II or unspecified type diabetes mellitus without mention of complication, not stated as uncontrolled   . Esophageal reflux   . Diverticulosis of colon (without mention of hemorrhage)   . Irritable bowel syndrome   . Benign neoplasm of colon   . Renal calculus   . BPH (benign prostatic hypertrophy)   . Degenerative joint disease   . Lumbar back pain   . Anxiety   . Anemia     Past Surgical History  Procedure Laterality Date  . Cholecystectomy    . Cataract extraction    . Skin cancer excision  3/10    left hand   . Knee surgery  11/10    Family History   Problem Relation Age of Onset  . Heart disease Father 27  . Hypertension Brother   . Stroke Mother 99  . Diabetes Mother   . Colon cancer Neg Hx     History  Substance Use Topics  . Smoking status: Former Smoker -- 1.00 packs/day for 25 years    Types: Cigarettes    Quit date: 09/15/1979  . Smokeless tobacco: Former Neurosurgeon    Quit date: 09/15/1979  . Alcohol Use: No      Review of Systems A complete 10 system review of systems was obtained and all systems are negative except as noted in the HPI and PMH.    Allergies  Prednisone  Home Medications   Current Outpatient Rx  Name  Route  Sig  Dispense  Refill  . aspirin 81 MG tablet   Oral   Take 81 mg by mouth daily.           [name] atenolol (TENORMIN) 25 MG tablet   Oral   Take 1 tablet (25 mg total) by mouth daily.   30 tablet   11   . Cholecalciferol (VITAMIN D) 2000 UNITS CAPS   Oral   Take 1 capsule by mouth daily.           [name]  COMPRO 25 MG suppository      PLACE 1 SUPPOSITORY (25 MG TOTAL) RECTALLY EVERY 12 (TWELVE) HOURS AS NEEDED FOR NAUSEA.   12 suppository   0   . dextromethorphan-guaiFENesin (MUCINEX DM) 30-600 MG per 12 hr tablet   Oral   Take 1 tablet by mouth every 12 (twelve) hours.         . fluticasone-salmeterol (ADVAIR HFA) 115-21 MCG/ACT inhaler   Inhalation   Inhale 1 puff into the lungs 2 (two) times daily.   1 Inhaler   11   . glucose blood (ONE TOUCH ULTRA TEST) test strip      Use as instructed   100 each   6   . glucose blood (ONE TOUCH ULTRA TEST) test strip      Test blood glucose levels once a day.  DX:  250.00   100 each   6   . hyoscyamine (LEVSIN SL) 0.125 MG SL tablet   Sublingual   Place 1 tablet (0.125 mg total) under the tongue every 4 (four) hours as needed. As needed for abdominal cramping   90 tablet   5   . losartan (COZAAR) 100 MG tablet      TAKE 1 TABLET BY MOUTH EVERY DAY   90 tablet   3   . metFORMIN (GLUCOPHAGE) 500 MG tablet   Oral    Take 1 tablet (500 mg total) by mouth 2 (two) times daily with a meal.   60 tablet   11   . mometasone (NASONEX) 50 MCG/ACT nasal spray   Nasal   Place 2 sprays into the nose as needed.   17 g   11   . Multiple Vitamins-Minerals (MULTIVITAMIN WITH MINERALS) tablet   Oral   Take 1 tablet by mouth daily.           . niacin (NIASPAN) 500 MG CR tablet   Oral   Take 1 tablet (500 mg total) by mouth at bedtime.   30 tablet   11   . nitroGLYCERIN (NITROSTAT) 0.4 MG SL tablet   Sublingual   Place 1 tablet (0.4 mg total) under the tongue every 5 (five) minutes as needed for chest pain.   30 tablet   11   . NON FORMULARY   Injection   Inject as directed every 30 (thirty) days. Allergy shot         . pantoprazole (PROTONIX) 40 MG tablet   Oral   Take 1 tablet (40 mg total) by mouth daily.   30 tablet   11   . polyethylene glycol powder (GLYCOLAX/MIRALAX) powder      TAKE 17 GRAMS BY MOUTH TWICE A DAY AS DIRECTED   1054 g   7   . sodium chloride (OCEAN) 0.65 % nasal spray   Nasal   1 spray by Nasal route as needed.           . tadalafil (CIALIS) 20 MG tablet   Oral   Take 20 mg by mouth daily as needed for erectile dysfunction.         . Tamsulosin HCl (FLOMAX) 0.4 MG CAPS      TAKE ONE CAPSULE EVERY DAY   30 capsule   11   . traMADol (ULTRAM) 50 MG tablet   Oral   Take 1 tablet (50 mg total) by mouth 3 (three) times daily as needed for pain.   90 tablet   5   . triamcinolone (KENALOG) 0.1 % cream  Topical   Apply topically 2 (two) times daily. As directed            BP 151/70  Pulse 72  Temp(Src) 98.1 F (36.7 C) (Oral)  Resp 20  Ht 6\' 2"  (1.88 m)  Wt 226 lb (102.513 kg)  BMI 29 kg/m2  SpO2 95%  Physical Exam  Nursing note and vitals reviewed. Constitutional: He is oriented to person, place, and time. He appears well-developed and well-nourished.  HENT:  Head: Normocephalic and atraumatic.  Eyes: EOM are normal. Pupils are equal,  round, and reactive to light.  Neck: Normal range of motion. Neck supple.  Cardiovascular: Normal rate, normal heart sounds and intact distal pulses.   Pulmonary/Chest: Effort normal and breath sounds normal.  Abdominal: Bowel sounds are normal. He exhibits no distension. There is no tenderness.  Musculoskeletal: Normal range of motion. He exhibits no edema and no tenderness.  Neurological: He is alert and oriented to person, place, and time. He has normal strength. No cranial nerve deficit or sensory deficit.  Skin: Skin is warm and dry. No rash noted.  2-cm by 2-cm circular ulcer to left anterior knee with mild surrounding erythema, no warmth, no drainage of pus, and no joint effusion  Psychiatric: He has a normal mood and affect.    ED Course  Procedures (including critical care time)  DIAGNOSTIC STUDIES: Oxygen Saturation is 95% on room air, adequate by my interpretation.    COORDINATION OF CARE: 10:43 PM-Discussed treatment plan which includes antibiotic ointment and f/u with PCP with pt at bedside and pt agreed to plan.      Labs Reviewed - No data to display No results found.   1. Abrasion   2. Cellulitis       MDM  Poorly healing abrasion to L knee now with ulceration, clean base with granulation tissue, small amount of thick yellow fibrous material debrided with tweezers and saline skin scrub.  removed, mild surrounding erythema may represent early cellulitis. Will start Keflex. Advised continued local wound care. PCP follow up for recheck. Return for worsening      I personally performed the services described in this documentation, which was scribed in my presence. The recorded information has been reviewed and is accurate.     Hersey Maclellan B. Bernette Mayers, MD 01/28/13 2258

## 2013-01-28 NOTE — ED Notes (Signed)
MD at bedside. 

## 2013-02-09 ENCOUNTER — Encounter: Payer: Self-pay | Admitting: Adult Health

## 2013-02-09 ENCOUNTER — Ambulatory Visit (INDEPENDENT_AMBULATORY_CARE_PROVIDER_SITE_OTHER): Payer: Medicare Other | Admitting: Adult Health

## 2013-02-09 VITALS — BP 138/72 | HR 69 | Temp 97.2°F | Ht 74.0 in | Wt 231.4 lb

## 2013-02-09 DIAGNOSIS — L0390 Cellulitis, unspecified: Secondary | ICD-10-CM | POA: Diagnosis not present

## 2013-02-09 NOTE — Assessment & Plan Note (Signed)
Left knee cellulitis resolved w/ abx.   Plan  Keep area clean and dry.  Cover area with bandage until healed.  Call if area worsens with redness, fever or pain  Please contact office for sooner follow up if symptoms do not improve or worsen or seek emergency care

## 2013-02-09 NOTE — Patient Instructions (Addendum)
Keep area clean and dry.  Cover area with bandage until healed.  Call if area worsens with redness, fever or pain  Please contact office for sooner follow up if symptoms do not improve or worsen or seek emergency care

## 2013-02-09 NOTE — Progress Notes (Signed)
Subjective:    Patient ID: EDU ON, male    DOB: 1936/08/25, 77 y.o.   MRN: [id number]  HPI 77 y/o WM   02/09/2013 Follow up  Returns for ER follow up .  Seen in ER on 5/17 for left knee wound.  [place] park, tripped and landed on left knee Had abrasion that would not heal. Increased redness and drainage . Went to ER on 5/17 . Started on keflex x 7 days for cellulitis . Area is much improved w/ scab formation , decreased redness. No fever or drainage .  Has finished all abx. No swelling  No wound cx sent.  Doing well over all . Recent [place]had [place] time  No calf pain, swelling, fever , or chest pain        Problem List:  HEARING LOSS (ICD-389.9) - he has bilat hearing aides...  ALLERGIC RHINITIS (ICD-477.9) - on allergy shots from DrESL Laney Pastor)... plus Claritin, Saline, Mucinex, Flonase. ~  occas bouts of sinusitis requiring antibiotic Rx...  OBSTRUCTIVE SLEEP APNEA (ICD-327.23) - sleep study 8/04 showed RDI= 45 & desat to 88%... mod snoring and leg jerks without sleep disruption... CPAP perscribed but not using it now- states he can't sleep w/ it on & furthermore he is resting well as long as he stays on his side... denies daytime hypersomnolence & not interested in re-evaluation.  COPD (ICD-496) - ex-smoker, quit 1981... on ADVAIR 100Bid, PROAIR Prn, MUCINEX Bid... he had Pneumovax in 2008... doesn't like Pred Rx...  ~  10/11:  doing well except recent cough, min green sputum (no change in dyspnea)- we will Rx w/ Augmentin, incr Mucinex/ Fluids. ~  4/12:  Breathing at baseline, continue maintenance meds...  HYPERTENSION (ICD-401.9) - prev on diet alone> then on Diovan320, but we decided to change to LOSARTAN 100mg /d 5/13 to save $$ ~  He claims that Norvasc & HCTZ "stopped my urine flow"... ~  10/11:  BP= 128/80 & even better at home he says... denies HA, fatigue, visual changes, CP, palipit, dizziness, syncope, dyspnea, edema, etc... ~  4/12:   Pt has seen TP, DrHochrein, & checking BP daily at home; tried on Norvasc but claims it decr his urine flow; now on Diovan & improved w/ BP= 132/70... ~  5/12:  BP remains well controlled on Diovan 320mg /d= 120/62 today. ~  11/12:  BP= 142/82 on Diovan160 now; denies CP, palpit, dizzy, SOB, edema... ~  5/13:  BP= 128/66 & he's requesting change to cheaper med; rec switch Diovan to LOSARTAN 100mg /d...  R/O CAD (ICD-414.00) - on ASA 81mg /d... Followed by DrHochrein & his notes are reviewed. ~  h[place] neg cardiac cath 1988 [place]... ~  NuclearStressTest 10/02 was norm- no scar or ischemia, EF=55%. ~  CT Abd 4/10 via ER showed coronary calcif... referred to Cards. ~  Eval by DrHochrein 6/10 showed norm EKG & cardiac exam... treadmill ordered. ~  Treadmill 6/10 showed reasonable exerc tolerance, no ischemic changes, few PVCs, sl incr BP... ~  Follow up Myoview scheduled 4/12 ==> pending  MIXED HYPERLIPIDEMIA (ICD-272.2) - low HDL and NIASPAN 500mg /d started 4/09... ~  FLP 4/09 showed TChol 160, TG 106, HDL 26, LDL 113... rec- diet + Niaspan 500/d. ~  FLP 10/09 showed TChol 155, TG 78, HDL 33, LDL 106... ~  FLP 4/10 showed TChol 133, TG 63, HDL 30, LDL 91... rec> continue same. ~  FLP 4/11 showed TChol 144, TG 61, HDL  38, LDL 94 ~  FLP 10/11 showed TChol 155, TG 81, HDL 40, LDL 99 ~  FLP 4/12 showed TChol 142, TG 69, HDL 38, LDL 90 ~  FLP 11/12 showed TChol 165, TG 82, HDL 43, LDL 106 ~  FLP 5/13 in Niasp500 showed TChol 155, TG 72, HDL 46, LDL 95... rec change to generic.  DM (ICD-250.00) - on diet + METFORMIN 500mg Bid... he reports BS at home all in the 120-130's... ~  labs 4/09 showed BS= 132, HgA1c= 6.6.Marland KitchenMarland Kitchen rec- same med, better diet... ~  labs 10/09 showed BS= 131, HgA1c= 6.3.Marland KitchenMarland Kitchen ~  labs 4/10 showed BS= 117, HgA1c= 6.2.[name]. rec> same meds/ diet Rx. ~  labs 10/10 showed BS= 138, A1c= 6.5 ~  labs 4/11 showed BS= 121, A1c= 6.5.Marland KitchenMarland Kitchen On Metform500Bid + diet. ~  Dilated eye exam 5/11 by  DrShapiro- no retinopathy... ~  labs 10/11 (wt=228#) showed BS= 118, A1c= 7.1.[name]. not as good- get on diet or more meds. ~  Labs 4/12 (wt=229#) showed BS= 119, A1c= 7.3... Wrong direction, may need more meds, get wt down! ~  5/12:  Ophthalmology check by DrShapiro was neg- no retinopathy... ~  Labs 11/12 showed BS= 136, A1c= 7.0 ~  Labs 5/13 on MetformBid showed BS= 132, A1c= 7.1.Marland KitchenMarland Kitchen Continue same, get wt down.  GERD (ICD-530.81) & ? GASTROPARESIS - on PROTONIX 40mg /d, & off Reglan per DrPerry... ~   last EGD 6/07 by DrPerry showed gastic polyp... ~  8/10:  given Compazine suppos for gastroparesis flair by DrGessner. ~  4/12:  C/o intermittent dysphagia & referred back to GI for further eval (colonoscopy due as well)...  DIVERTICULOSIS OF COLON (ICD-562.10) - he uses MIRALAX Bid regularly... IRRITABLE BOWEL SYNDROME (ICD-564.1) COLONIC POLYPS (ICD-211.3) ~  last colonoscopy 5/07 by DrSam showed divertics only... f/u 38yrs. ~  CT Abd 4/10 in ER showed atx bases, coronary calcif, s/p GB, abdAo calcif, 11mm renal stone, divertics, bilat hip AVN. ~  Follow up colonoscopy by DrPerry 5/12 showed severe diverticulosis, 2 sm polyps= tubular adenoma & f/u planned 5 yrs...  RENAL CALCULUS, HX OF (ICD-V13.01) BENIGN PROSTATIC HYPERTROPHY, HX OF (ICD-V13.8) - on FLOMAX 0.4mg /d w/ improved symptoms (he states flow better since stopping Vit E supplement). ~  4/12:  Routine PSA= 4.9 (it was 1.02 4/11) & we rx w/ Doxy Bid x14d, thewn plan recheck PSA==> 1.30  DEGENERATIVE JOINT DISEASE (ICD-715.90) - pt states "I have a bum knee" and eval by DrDuda w/ viscosupplementation shots... Note: Abd CT w/ bilat hip AVN noted... takes TYLENOL Prn... ~  11/10:  s/p left knee arthroscopy by DrDuda... ~  10/11:  s/p fall w/ left knee injury- s/p cortisone shot by DrDuda; hx shot in left shoulder too.  BACK PAIN, LUMBAR (ICD-724.2)  ANXIETY (ICD-300.00) - not currently on meds for nerves.  ANEMIA (ICD-285.9) - prev hx  of GIB... 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  Health Maintenance: ~  GI: followed by DrPerry> Colonoscopy 5/07 by DrSam... f/u planned 5 yrs. ~  GU:  See PSAs recorded above... ~  Immunizations: Pneumovax in 2008 @ age 66;  Tetanus- given 4/10;  he gets yearly seasonal Flu vaccine.   Past Surgical History  Procedure Laterality Date  . Cholecystectomy    . Cataract extraction    . Skin cancer  excision  3/10    left hand   . Knee surgery  11/10    Outpatient Encounter Prescriptions as of 02/09/2013  Medication Sig Dispense Refill  . aspirin 81 MG tablet Take 81 mg by mouth daily.        [name] atenolol (TENORMIN) 25 MG tablet Take 1 tablet (25 mg total) by mouth daily.  30 tablet  11  . Cholecalciferol (VITAMIN D) 2000 UNITS CAPS Take 1 capsule by mouth daily.        . COMPRO 25 MG suppository PLACE 1 SUPPOSITORY (25 MG TOTAL) RECTALLY EVERY 12 (TWELVE) HOURS AS NEEDED FOR NAUSEA.  12 suppository  0  . dextromethorphan-guaiFENesin (MUCINEX DM) 30-600 MG per 12 hr tablet Take 1 tablet by mouth every 12 (twelve) hours.      . fluticasone-salmeterol (ADVAIR HFA) 115-21 MCG/ACT inhaler Inhale 1 puff into the lungs 2 (two) times daily.  1 Inhaler  11  . glucose blood (ONE TOUCH ULTRA TEST) test strip Use as instructed  100 each  6  . glucose blood (ONE TOUCH ULTRA TEST) test strip Test blood glucose levels once a day.  DX:  250.00  100 each  6  . hyoscyamine (LEVSIN SL) 0.125 MG SL tablet Place 1 tablet (0.125 mg total) under the tongue every 4 (four) hours as needed. As needed for abdominal cramping  90 tablet  5  . losartan (COZAAR) 100 MG tablet TAKE 1 TABLET BY MOUTH EVERY DAY  90 tablet  3  . metFORMIN (GLUCOPHAGE) 500 MG tablet Take 1 tablet (500 mg total) by mouth 2 (two) times daily with a meal.  60 tablet  11  . mometasone (NASONEX) 50 MCG/ACT nasal  spray Place 2 sprays into the nose as needed.  17 g  11  . Multiple Vitamins-Minerals (MULTIVITAMIN WITH MINERALS) tablet Take 1 tablet by mouth daily.        . niacin (NIASPAN) 500 MG CR tablet Take 1 tablet (500 mg total) by mouth at bedtime.  30 tablet  11  . nitroGLYCERIN (NITROSTAT) 0.4 MG SL tablet Place 1 tablet (0.4 mg total) under the tongue every 5 (five) minutes as needed for chest pain.  30 tablet  11  . NON FORMULARY Inject as directed every 30 (thirty) days. Allergy shot      . pantoprazole (PROTONIX) 40 MG tablet Take 1 tablet (40 mg total) by mouth daily.  30 tablet  11  . polyethylene glycol powder (GLYCOLAX/MIRALAX) powder TAKE 17 GRAMS BY MOUTH TWICE A DAY AS DIRECTED  1054 g  7  . sodium chloride (OCEAN) 0.65 % nasal spray 1 spray by Nasal route as needed.        . tadalafil (CIALIS) 20 MG tablet Take 20 mg by mouth daily as needed for erectile dysfunction.      . Tamsulosin HCl (FLOMAX) 0.4 MG CAPS TAKE ONE CAPSULE EVERY DAY  30 capsule  11  . traMADol (ULTRAM) 50 MG tablet Take 1 tablet (50 mg total) by mouth 3 (three) times daily as needed for pain.  90 tablet  5  . triamcinolone (KENALOG) 0.1 % cream Apply topically 2 (two) times daily. As directed       . cephALEXin (KEFLEX) 500 MG capsule Take 1 capsule (500 mg total) by mouth 4 (four) times daily.  28 capsule  0   No facility-administered encounter medications on file as of 02/09/2013.    Allergies  Allergen Reactions  . Prednisone  REACTION: high dose intolerance    Current Medications, Allergies, Past Medical History, Past Surgical History, Family History, and Social History were reviewed in [employer] record.    Review of Systems         Constitutional:   No  weight loss, night sweats,  Fevers, chills, fatigue, or  lassitude.  HEENT:   No headaches,  Difficulty swallowing,  Tooth/dental problems, or  Sore throat,                No sneezing, itching, ear ache, nasal congestion,  post nasal drip,   CV:  No chest pain,  Orthopnea, PND, swelling in lower extremities, anasarca, dizziness, palpitations, syncope.   GI  No heartburn, indigestion, abdominal pain, nausea, vomiting, diarrhea, change in bowel habits, loss of appetite, bloody stools.   Resp:   No coughing up of blood.    No chest wall deformity  Skin: left knee scab  GU: no dysuria, change in color of urine, no urgency or frequency.  No flank pain, no hematuria   MS:   No knee swelling   Psych:  No change in mood or affect. No depression or anxiety.  No memory loss.       Objective:   Physical Exam      WD, WN, 77  y/o WM in NAD... GENERAL:  Alert & oriented; pleasant & cooperative... HEENT:  [place]/AT,   EACs-clear, TMs-wnl, NOSE- clear drainage ,  THROAT-clear & wnl. NECK:  Supple w/ fairROM; no JVD; normal carotid impulses w/o bruits; no thyromegaly or nodules palpated; no lymphadenopathy. CHEST:  Diminshed BS in bases   without wheezes/ rales/ or rhonchi heard... HEART:  Regular Rhythm; without murmurs/ rubs/ or gallops detected...  ABDOMEN:  Soft & nontender; normal bowel sounds; no organomegaly or masses palpated., no guarding, or rebound EXT: without deformities, mild arthritic changes; no varicose veins/ venous insuffic/ or edema. Knee ROM bilateral nml. No swelling or effusion noted.  NEURO:   no focal neuro deficits noted  DERM:  Along left knee with small scab noted, no sign redness, or drainage noted. No warmth noted.    Assessment & Plan:

## 2013-02-13 DIAGNOSIS — J309 Allergic rhinitis, unspecified: Secondary | ICD-10-CM | POA: Diagnosis not present

## 2013-02-16 DIAGNOSIS — E119 Type 2 diabetes mellitus without complications: Secondary | ICD-10-CM | POA: Diagnosis not present

## 2013-02-16 DIAGNOSIS — Z961 Presence of intraocular lens: Secondary | ICD-10-CM | POA: Diagnosis not present

## 2013-03-06 DIAGNOSIS — D235 Other benign neoplasm of skin of trunk: Secondary | ICD-10-CM | POA: Diagnosis not present

## 2013-03-06 DIAGNOSIS — L570 Actinic keratosis: Secondary | ICD-10-CM | POA: Diagnosis not present

## 2013-03-14 DIAGNOSIS — J309 Allergic rhinitis, unspecified: Secondary | ICD-10-CM | POA: Diagnosis not present

## 2013-03-23 ENCOUNTER — Encounter: Payer: Self-pay | Admitting: Pulmonary Disease

## 2013-03-23 ENCOUNTER — Ambulatory Visit (INDEPENDENT_AMBULATORY_CARE_PROVIDER_SITE_OTHER): Payer: Medicare Other | Admitting: Pulmonary Disease

## 2013-03-23 ENCOUNTER — Other Ambulatory Visit (INDEPENDENT_AMBULATORY_CARE_PROVIDER_SITE_OTHER): Payer: Medicare Other

## 2013-03-23 VITALS — BP 128/72 | HR 66 | Temp 97.3°F | Ht 74.0 in | Wt 227.8 lb

## 2013-03-23 DIAGNOSIS — E782 Mixed hyperlipidemia: Secondary | ICD-10-CM

## 2013-03-23 DIAGNOSIS — I2510 Atherosclerotic heart disease of native coronary artery without angina pectoris: Secondary | ICD-10-CM

## 2013-03-23 DIAGNOSIS — E119 Type 2 diabetes mellitus without complications: Secondary | ICD-10-CM | POA: Diagnosis not present

## 2013-03-23 DIAGNOSIS — I10 Essential (primary) hypertension: Secondary | ICD-10-CM | POA: Diagnosis not present

## 2013-03-23 DIAGNOSIS — M25552 Pain in left hip: Secondary | ICD-10-CM

## 2013-03-23 DIAGNOSIS — K589 Irritable bowel syndrome without diarrhea: Secondary | ICD-10-CM

## 2013-03-23 DIAGNOSIS — F411 Generalized anxiety disorder: Secondary | ICD-10-CM

## 2013-03-23 DIAGNOSIS — K219 Gastro-esophageal reflux disease without esophagitis: Secondary | ICD-10-CM

## 2013-03-23 DIAGNOSIS — M1990 Unspecified osteoarthritis, unspecified site: Secondary | ICD-10-CM

## 2013-03-23 DIAGNOSIS — J449 Chronic obstructive pulmonary disease, unspecified: Secondary | ICD-10-CM

## 2013-03-23 DIAGNOSIS — K5730 Diverticulosis of large intestine without perforation or abscess without bleeding: Secondary | ICD-10-CM

## 2013-03-23 DIAGNOSIS — Z87898 Personal history of other specified conditions: Secondary | ICD-10-CM

## 2013-03-23 DIAGNOSIS — G8929 Other chronic pain: Secondary | ICD-10-CM

## 2013-03-23 DIAGNOSIS — M25559 Pain in unspecified hip: Secondary | ICD-10-CM

## 2013-03-23 DIAGNOSIS — J309 Allergic rhinitis, unspecified: Secondary | ICD-10-CM

## 2013-03-23 LAB — HEMOGLOBIN A1C: Hgb A1c MFr Bld: 7.3 % — ABNORMAL HIGH (ref 4.6–6.5)

## 2013-03-23 LAB — BASIC METABOLIC PANEL
CO2: 23 mEq/L (ref 19–32)
Chloride: 108 mEq/L (ref 96–112)
Potassium: 4.5 mEq/L (ref 3.5–5.1)

## 2013-03-23 MED ORDER — LOSARTAN POTASSIUM 100 MG PO TABS: ORAL_TABLET | ORAL | Status: DC

## 2013-03-23 MED ORDER — POLYETHYLENE GLYCOL 3350 17 GM/SCOOP PO POWD: ORAL | Status: DC

## 2013-03-23 MED ORDER — TAMSULOSIN HCL 0.4 MG PO CAPS: 0.4000 mg | ORAL_CAPSULE | Freq: Two times a day (BID) | ORAL | Status: DC

## 2013-03-23 NOTE — Patient Instructions (Addendum)
Today we updated your med list in our EPIC system...    Continue your current medications the same...  We decided to INCREASE the TAMSULOSIN 0.4mg  to twice daily...  Today we did your follow up Diabetic labs...    We will contact you w/ the results when available...   Let's get on track w/ our diet 7 exercise program...    The goal is to lose 15-20 lbs...  Call for any questions...  Let's plan a follow up visit in 26mo, sooner if needed for problems.Marland KitchenMarland Kitchen

## 2013-03-23 NOTE — Progress Notes (Signed)
Subjective:    Patient ID: ZYMIERE TROSTLE, male    DOB: 1935/11/14, 77 y.o.   MRN: [id number]  HPI 77 y/o WM w/ mult med problems here for a follow up visit>   ~  Feb 11, 2012:  575mo ROV & Gala Romney reports a broken bone in right hand from playing w/ the dog- in short arm cast now (DrDuda);  He is in the donut hole & requesting any med changes to generics & we reviewed this esp as it relates to very low cash pay meds;  We decided to change Niaspan to OTC slow release Niacin, and change Diovan160 to Losartan100 & he will follow BP at home after the transition...    He saw DrHochrein 4/13 after being in the ER 11/29/11 for AtypCP> Myoview was neg- no ischemia or infarct, EF=71%; he has been walking daily & no CP, recommended to stay active & get weight down...    He had recent f/u DrShapiro for Ophthalmology> s/p cats, no DM retinopathy seen...    We reviewed prob list, meds, xrays and labs> see below>> CXR 3/13 showed normal heart size, clear lungs, NAD.Marland KitchenMarland Kitchen EKG 3/13 showed NSR, rate75, WNL, NAD... LABS 3/13:  Chems- ok x BS=232;  CBC- wnl... LABS 5/13:  FLP- at goals on Niacin alone;  Chems- ok x BS=132 A1c=7.1 on MetformBid;  TSH=1.42;  PSA=1.04...  ~  September 15, 2012:  75mo ROV & Gala Romney is c/o some pain in his neck=> we will Rx w/ Tramadol for prn use;  He also notes that his pulse seems "higher" since he started the Losartan, pulse= 87 reg today, BP=140/84, and we discussed adding Aten25mg /d to his med regimen...  We reviewed the following medical problems during today's office visit>>     HOH> he has hearing aides...    AR> on allergy shots, OTC antihist, saline, Nasonex; gets occas bouts of sinusitis but iproved this past yr...    OSA> Sleep Study was in 204 w/ RDI=45 & desat to 88% w/ mod snoring & leg jerks, but he states he never could use the CPAP effectively & he sleeps fine if he stays on his side; denies daytime sleepiness or other issues...    COPD> exsmoker quit 1981; on AdvairHFA115-2spBid,  Mucinex prn; he doesn't like Pred Rx; he had Bronchitic exac treated by TP 12/13 w/ Augmentin, Mucinex, etc & improved; needs incr exercise program...    HBP> on Losar100; BP=140/84, he notes incr HR he says & we decided to add low dose ATEN25mg /d...    CAD> on ASA81; followed by DrHochrein- CT Abd 2010 showed coronary calcif, Treadmill was neg; Myoview 3/13 was neg, wnl...    Hyperlipid> on Niacin500mg ; FLP showed TChol 140, TG 56, HDL 39, LDL 90; continue same, get wt down...    DM> on Metform500Bid; Labs showed BS=134, A1c=7.5; wt=228# (no change) rec to continue same med, better diet, get wt down.[name]    GI- GERD, dysphagia, Divertics, IBS, Polyps> on Protonix40, Miralax, Levsin0.125 prn; followed by DrPerry & stable- last colon 5/12 w/ severe divertics & 2 sm adenomas removed...    GU- Kid stone, BPH> on Flomax0.4 & Cialis prn; PSA remains wnl at 1.10    DJD, LBP> left knee complaints w/ shots from DrDuda, similar for left shoulder; hx bilat hip AVN noted on prev CT Abd but he has min complaints and uses Tylenol prn...    Anxiety> he does not want anxiolytic meds.. We reviewed prob list, meds, xrays and labs> see  below for updates >> he had the 2013 Flu vaccine 9/13... Meds refilled today per request... LABS 1/14:  FLP- at goals on Niacin rx;  Chems- ok w/ BS=134, A1c=7.5;  CBC- wnl;  TSH=1.13;  VitD=42;  PSA=1.10      ~  December 02, 2012:  2-56mo ROV & add-on for 1wk hx right ear pain, dizzy, gums sore, aching, HA w/ pressure sensation;  Exam shows afeb, ears clear, TMs ok, sl pharyngeal erythema w/o exud, chest w/ scat rhonchi;  We decided to treat w/ Depo80, Medrol dosepak, & ZPak...  He has Tramadol & Tylenol to use for pain... We reviewed prob list, meds, xrays and labs> see below for updates >>   ~  March 23, 2013:  3-47mo ROV & Gala Romney has several minor complants> He fell w/ abrasion on left knee w/ resultant cellulitis- went to ER & had f/u w/ TP, now resolved after Keflex Rx; c/o prob w/ BPH &  obstructive symptoms w/ nocturia & decr flow despite Flomax0.4mg /d- he would like to incr to Bid before considering Urology referral; also c/o some left hip pain- known AVN from prev evals & usually controlled w/ Tylenol alone; Rec to use the Tramadol + Tylenol; he is established w/ drDuda but hasn't had re-eval in some time & encouraged to f/u w/ Ortho at his convenience...    Breathing is good on AdvairMDI-115-21 at 2spBid + his allergy shots & meds; denies cough, sput, dyspnea, CP, etc...    BP controlled on Aten25, Losar100> BP=128/72 & he denies CP, palpit, SOB, edema, etc...     He saw DrHochrein 4/14> hx CAD, doing satis on ASA & BP rx w/o angina & rec to follow program of aggressive risk factor reduction, no change in meds...    Lipids are controlled on diet + Niacin;  DM is treated w/ Metform500Bid- he deperately needs to diet, incr exercise & get the weight down; Labs 7/14 showed BS=140, A1c=7.3 We reviewed prob list, meds, xrays and labs> see below for updates >> meds refilled per request today... LABS 7/14:  Chems- ok x BS=140, A1c=7.3.Marland KitchenMarland Kitchen          Problem List:  HEARING LOSS (ICD-389.9) - he has bilat hearing aides...  ALLERGIC RHINITIS (ICD-477.9) - on allergy shots from DrESL Laney Pastor)... plus Claritin, Saline, Mucinex, Flonase. ~  occas bouts of sinusitis requiring antibiotic Rx... ~  8/13: he had allergy f/u DrVanWinkle> skin testing 2010 pos to dust mites and molds; doing well on shots once/mo; stable & no changes made... ~  3/14: he presented w/ an upper resp infection & treated w/ Depo, Dosepak, Zithromax...  OBSTRUCTIVE SLEEP APNEA (ICD-327.23) - sleep study 8/04 showed RDI= 45 & desat to 88%... mod snoring and leg jerks without sleep disruption... CPAP perscribed but not using it now- states he can't sleep w/ it on & furthermore he is resting well as long as he stays on his side... denies daytime hypersomnolence & not interested in re-evaluation.  COPD (ICD-496) - ex-smoker,  quit 1981... on ADVAIR 100Bid, PROAIR Prn, MUCINEX Bid... he had Pneumovax in 2008... doesn't like Pred Rx...  ~  10/11:  doing well except recent cough, min green sputum (no change in dyspnea)- we will Rx w/ Augmentin, incr Mucinex/ Fluids. ~  4/12:  Breathing at baseline, continue maintenance meds... ~  CXR 12/13 showed normal heart size, clear lungs, NAD... ~  1/14:  He's had 2 exac this past yr- saw TP w/ Rx & improved...  HYPERTENSION (ICD-401.9) -  prev on diet alone> then on Diovan320, but we decided to change to LOSARTAN 100mg /d 5/13 to save $$ ~  He claims that Norvasc & HCTZ "stopped my urine flow"... ~  10/11:  BP= 128/80 & even better at home he says... denies HA, fatigue, visual changes, CP, palipit, dizziness, syncope, dyspnea, edema, etc... ~  4/12:  Pt has seen TP, DrHochrein, & checking BP daily at home; tried on Norvasc but claims it decr his urine flow; now on Diovan & improved w/ BP= 132/70... ~  5/12:  BP remains well controlled on Diovan 320mg /d= 120/62 today. ~  11/12:  BP= 142/82 on Diovan160 now; denies CP, palpit, dizzy, SOB, edema... ~  5/13:  BP= 128/66 & he's requesting change to cheaper med; rec switch Diovan to LOSARTAN 100mg /d... ~  1/14: on Losar100; BP=140/84, he notes incr HR he says & we decided to add low dose ATENOLOL25mg /d... ~  7/14: on Aten25, Losar100> BP=128/72 & he denies CP, palpit, SOB, edema, etc...  R/O CAD (ICD-414.00) - on ASA 81mg /d... Followed by DrHochrein & his notes are reviewed. ~  hx neg cardiac cath 1988 by DrJoe Algodones... ~  NuclearStressTest 10/02 was norm- no scar or ischemia, EF=55%. ~  CT Abd 4/10 via ER showed coronary calcif... referred to Cards. ~  Eval by DrHochrein 6/10 showed norm EKG & cardiac exam... treadmill ordered. ~  Treadmill 6/10 showed reasonable exerc tolerance, no ischemic changes, few PVCs, sl incr BP... ~  EKG 3/13 showed NSR, rate76, WNL, NAD... ~  Myoview 3/13 was neg> exercised for , stopped for fatigue &  CP; no ST seg changes, occas PACs & PVCs; hypertensive BP response; no ischemia, EF=71%, normal wall motion... ~  He saw DrHochrein 4/14> hx CAD, doing satis on ASA & BP rx w/o angina & rec to follow program of aggressive risk factor reduction, no change in meds.   MIXED HYPERLIPIDEMIA (ICD-272.2) - low HDL and NIASPAN 500mg /d started 4/09... ~  FLP 4/09 showed TChol 160, TG 106, HDL 26, LDL 113... rec- diet + Niaspan 500/d. ~  FLP 10/09 showed TChol 155, TG 78, HDL 33, LDL 106... ~  FLP 4/10 showed TChol 133, TG 63, HDL 30, LDL 91... rec> continue same. ~  FLP 4/11 showed TChol 144, TG 61, HDL 38, LDL 94 ~  FLP 10/11 showed TChol 155, TG 81, HDL 40, LDL 99 ~  FLP 4/12 showed TChol 142, TG 69, HDL 38, LDL 90 ~  FLP 11/12 showed TChol 165, TG 82, HDL 43, LDL 106 ~  FLP 5/13 in Niasp500 showed TChol 155, TG 72, HDL 46, LDL 95... rec change to generic. ~  FLP 1/14 on Niacin500 showed TChol 140, TG 56, HDL 39, LDL 90  DM (ICD-250.00) - on diet + METFORMIN 500mg Bid... he reports BS at home all in the 120-130's... ~  labs 4/09 showed BS= 132, HgA1c= 6.6.Marland KitchenMarland Kitchen rec- same med, better diet... ~  labs 10/09 showed BS= 131, HgA1c= 6.3.Marland KitchenMarland Kitchen ~  labs 4/10 showed BS= 117, HgA1c= 6.2.[name]. rec> same meds/ diet Rx. ~  labs 10/10 showed BS= 138, A1c= 6.5 ~  labs 4/11 showed BS= 121, A1c= 6.5.Marland KitchenMarland Kitchen On Metform500Bid + diet. ~  Dilated eye exam 5/11 by DrShapiro- no retinopathy... ~  labs 10/11 (wt=228#) showed BS= 118, A1c= 7.1.[name]. not as good- get on diet or more meds. ~  Labs 4/12 (wt=229#) showed BS= 119, A1c= 7.3... Wrong direction, may need more meds, get wt down! ~  5/12:  Ophthalmology check  by DrShapiro was neg- no retinopathy... ~  Labs 11/12 showed BS= 136, A1c= 7.0 ~  Labs 5/13 on MetformBid showed BS= 132, A1c= 7.1.Marland KitchenMarland Kitchen Continue same, get wt down. ~  Ophthalmology check from DrShapiro 5/13> no DM retinopathy... ~  Labs 1/14 showed BS= 134, A1c= 7.5; not as good- needs better diet, get wt down, same meds for  now... ~  Labs 7/14 on Metform500Bid showed BS= 140, A1c= 7.3   GERD (ICD-530.81) & ? GASTROPARESIS - on PROTONIX 40mg /d, & off Reglan per DrPerry... ~   last EGD 6/07 by DrPerry showed gastic polyp... ~  8/10:  given Compazine suppos for gastroparesis flair by DrGessner. ~  4/12:  C/o intermittent dysphagia & referred back to GI for further eval (colonoscopy due as well)...  DIVERTICULOSIS OF COLON (ICD-562.10) - he uses MIRALAX Bid regularly... IRRITABLE BOWEL SYNDROME (ICD-564.1) COLONIC POLYPS (ICD-211.3) ~  last colonoscopy 5/07 by DrSam showed divertics only... f/u 70yrs. ~  CT Abd 4/10 in ER showed atx bases, coronary calcif, s/p GB, abdAo calcif, 11mm renal stone, divertics, bilat hip AVN. ~  Follow up colonoscopy by DrPerry 5/12 showed severe diverticulosis, 2 sm polyps= tubular adenoma & f/u planned 5 yrs...  RENAL CALCULUS, HX OF (ICD-V13.01) BENIGN PROSTATIC HYPERTROPHY, HX OF (ICD-V13.8) - on FLOMAX 0.4mg /d w/ improved symptoms (he states flow better since stopping Vit E supplement). ~  4/12:  Routine PSA= 4.9 (it was 1.02 4/11) & we rx w/ Doxy Bid x14d, thewn plan recheck PSA==> 1.30  DEGENERATIVE JOINT DISEASE (ICD-715.90) - pt states "I have a bum knee" and eval by DrDuda w/ viscosupplementation shots... Note: Abd CT w/ bilat hip AVN noted... takes TYLENOL Prn... ~  11/10:  s/p left knee arthroscopy by DrDuda... ~  10/11:  s/p fall w/ left knee injury- s/p cortisone shot by DrDuda; hx shot in left shoulder too. ~  7/14: he is c/o incr pain in left hip area; known hx AVN & needs f/u by Ortho...  BACK PAIN, LUMBAR (ICD-724.2)  ANXIETY (ICD-300.00) - not currently on meds for nerves.  ANEMIA (ICD-285.9) - prev hx of GIB... 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  Health  Maintenance: ~  GI: followed by DrPerry> Colonoscopy 5/07 by DrSam... f/u planned 5 yrs. ~  GU:  See PSAs recorded above... ~  Immunizations: Pneumovax in 2008 @ age 89;  Tetanus- given 4/10;  he gets yearly seasonal Flu vaccine.   Past Surgical History  Procedure Laterality Date  . Cholecystectomy    . Cataract extraction    . Skin cancer excision  3/10    left hand   . Knee surgery  11/10    Outpatient Encounter Prescriptions as of 03/23/2013  Medication Sig Dispense Refill  . aspirin 81 MG tablet Take 81 mg by mouth daily.        [name] atenolol (TENORMIN) 25 MG tablet Take 1 tablet (25 mg total) by mouth daily.  30 tablet  11  . cephALEXin (KEFLEX) 500 MG capsule Take 1 capsule (500 mg total) by mouth 4 (four) times daily.  28 capsule  0  . Cholecalciferol (VITAMIN D) 2000 UNITS CAPS Take 1 capsule by mouth daily.        [name]  COMPRO 25 MG suppository PLACE 1 SUPPOSITORY (25 MG TOTAL) RECTALLY EVERY 12 (TWELVE) HOURS AS NEEDED FOR NAUSEA.  12 suppository  0  . dextromethorphan-guaiFENesin (MUCINEX DM) 30-600 MG per 12 hr tablet Take 1 tablet by mouth every 12 (twelve) hours.      . fluticasone-salmeterol (ADVAIR HFA) 115-21 MCG/ACT inhaler Inhale 1 puff into the lungs 2 (two) times daily.  1 Inhaler  11  . glucose blood (ONE TOUCH ULTRA TEST) test strip Use as instructed  100 each  6  . glucose blood (ONE TOUCH ULTRA TEST) test strip Test blood glucose levels once a day.  DX:  250.00  100 each  6  . hyoscyamine (LEVSIN SL) 0.125 MG SL tablet Place 1 tablet (0.125 mg total) under the tongue every 4 (four) hours as needed. As needed for abdominal cramping  90 tablet  5  . losartan (COZAAR) 100 MG tablet TAKE 1 TABLET BY MOUTH EVERY DAY  90 tablet  3  . metFORMIN (GLUCOPHAGE) 500 MG tablet Take 1 tablet (500 mg total) by mouth 2 (two) times daily with a meal.  60 tablet  11  . mometasone (NASONEX) 50 MCG/ACT nasal spray Place 2 sprays into the nose as needed.  17 g  11  . Multiple  Vitamins-Minerals (MULTIVITAMIN WITH MINERALS) tablet Take 1 tablet by mouth daily.        . niacin (NIASPAN) 500 MG CR tablet Take 1 tablet (500 mg total) by mouth at bedtime.  30 tablet  11  . nitroGLYCERIN (NITROSTAT) 0.4 MG SL tablet Place 1 tablet (0.4 mg total) under the tongue every 5 (five) minutes as needed for chest pain.  30 tablet  11  . NON FORMULARY Inject as directed every 30 (thirty) days. Allergy shot      . pantoprazole (PROTONIX) 40 MG tablet Take 1 tablet (40 mg total) by mouth daily.  30 tablet  11  . polyethylene glycol powder (GLYCOLAX/MIRALAX) powder TAKE 17 GRAMS BY MOUTH TWICE A DAY AS DIRECTED  1054 g  7  . sodium chloride (OCEAN) 0.65 % nasal spray 1 spray by Nasal route as needed.        . tadalafil (CIALIS) 20 MG tablet Take 20 mg by mouth daily as needed for erectile dysfunction.      . Tamsulosin HCl (FLOMAX) 0.4 MG CAPS TAKE ONE CAPSULE EVERY DAY  30 capsule  11  . traMADol (ULTRAM) 50 MG tablet Take 1 tablet (50 mg total) by mouth 3 (three) times daily as needed for pain.  90 tablet  5  . triamcinolone (KENALOG) 0.1 % cream Apply topically 2 (two) times daily. As directed        No facility-administered encounter medications on file as of 03/23/2013.    Allergies  Allergen Reactions  . Prednisone     REACTION: high dose intolerance    Current Medications, Allergies, Past Medical History, Past Surgical History, Family History, and Social History were reviewed in [employer] record.    Review of Systems         See HPI - all other systems neg except as noted... The patient complains of decreased hearing, dyspnea on exertion, headaches, and difficulty walking.  The patient denies anorexia, fever, weight loss, weight gain, vision loss, hoarseness, chest pain, syncope, peripheral edema, prolonged cough, hemoptysis, abdominal pain, melena, hematochezia, severe indigestion/heartburn, hematuria, incontinence, muscle weakness, suspicious skin  lesions, transient blindness, depression, unusual weight change, abnormal bleeding, enlarged lymph nodes, and angioedema.  Objective:   Physical Exam      WD, WN, 77 y/o WM in NAD... GENERAL:  Alert & oriented; pleasant & cooperative... HEENT:  [place]/AT, EOM-full, PERRLA, EACs-clear, TMs-wnl, NOSE- pale, congested, THROAT-clear & wnl. NECK:  Supple w/ fairROM; no JVD; normal carotid impulses w/o bruits; no thyromegaly or nodules palpated; no lymphadenopathy. CHEST:  Clear to P & A; without wheezes/ rales/ or rhonchi heard... HEART:  Regular Rhythm; without murmurs/ rubs/ or gallops detected...  ABDOMEN:  Soft & nontender; normal bowel sounds; no organomegaly or masses palpated., no guarding, or rebound EXT: without deformities, mild arthritic changes; no varicose veins/ venous insuffic/ or edema. NEURO:  CN's intact;  no focal neuro deficits... DERM:  No lesions noted; no rash etc...  RADIOLOGY DATA:  Reviewed in the EPIC EMR & discussed w/ the patient...  LABORATORY DATA:  Reviewed in the EPIC EMR & discussed w/ the patient...   Assessment & Plan:    COPD>  He continues on Advair, Proair rescue, Mucinex; also Claritin, Flonase, Saline; stable continue same Rx...  HBP>  On LOSARTAN 100mg /d & [id number] w/ good control of BP, continue same...  R/O CAD>  Followed by DrHochrein, and doing satis... continue ASA & risk factor reduction....  Lipids>  Looks satis on his diet + Niacin...  DM>  Control is fair w/ A1c stable at 7.3; he was told there are more meds in his future if he doesn't get his wt down (currently~230#)...  GI>  Per DrPerry w/ GERD, ?Gastroparesis, Divertics, IBS, Colon polyps> f/u colon 5/12 w/ divertics & 2 sm adenomas, repeat planned ~26yrs...  Anxiety>  Certainly an issue, he does not want anxiolytic Rx...   Patient's Medications  New Prescriptions   No medications on file  Previous Medications   ASPIRIN 81 MG TABLET    Take 81 mg by mouth daily.     ATENOLOL  (TENORMIN) 25 MG TABLET    Take 1 tablet (25 mg total) by mouth daily.   CHOLECALCIFEROL (VITAMIN D) 2000 UNITS CAPS    Take 1 capsule by mouth daily.     COMPRO 25 MG SUPPOSITORY    PLACE 1 SUPPOSITORY (25 MG TOTAL) RECTALLY EVERY 12 (TWELVE) HOURS AS NEEDED FOR NAUSEA.   DEXTROMETHORPHAN-GUAIFENESIN (MUCINEX DM) 30-600 MG PER 12 HR TABLET    Take 1 tablet by mouth every 12 (twelve) hours.   FLUTICASONE-SALMETEROL (ADVAIR HFA) 115-21 MCG/ACT INHALER    Inhale 1 puff into the lungs 2 (two) times daily.   GLUCOSE BLOOD (ONE TOUCH ULTRA TEST) TEST STRIP    Use as instructed   GLUCOSE BLOOD (ONE TOUCH ULTRA TEST) TEST STRIP    Test blood glucose levels once a day.  DX:  250.00   HYOSCYAMINE (LEVSIN SL) 0.125 MG SL TABLET    Place 1 tablet (0.125 mg total) under the tongue every 4 (four) hours as needed. As needed for abdominal cramping   METFORMIN (GLUCOPHAGE) 500 MG TABLET    Take 1 tablet (500 mg total) by mouth 2 (two) times daily with a meal.   MOMETASONE (NASONEX) 50 MCG/ACT NASAL SPRAY    Place 2 sprays into the nose as needed.   MULTIPLE VITAMINS-MINERALS (MULTIVITAMIN WITH MINERALS) TABLET    Take 1 tablet by mouth daily.     NIACIN (NIASPAN) 500 MG CR TABLET    Take 1 tablet (500 mg total) by mouth at bedtime.   NITROGLYCERIN (NITROSTAT) 0.4 MG SL TABLET    Place 1 tablet (0.4 mg total)  under the tongue every 5 (five) minutes as needed for chest pain.   NON FORMULARY    Inject as directed every 30 (thirty) days. Allergy shot   PANTOPRAZOLE (PROTONIX) 40 MG TABLET    Take 1 tablet (40 mg total) by mouth daily.   SODIUM CHLORIDE (OCEAN) 0.65 % NASAL SPRAY    1 spray by Nasal route as needed.     TADALAFIL (CIALIS) 20 MG TABLET    Take 20 mg by mouth daily as needed for erectile dysfunction.   TRAMADOL (ULTRAM) 50 MG TABLET    Take 1 tablet (50 mg total) by mouth 3 (three) times daily as needed for pain.   TRIAMCINOLONE (KENALOG) 0.1 % CREAM    Apply topically 2 (two) times daily. As directed    Modified Medications   Modified Medication Previous Medication   LOSARTAN (COZAAR) 100 MG TABLET losartan (COZAAR) 100 MG tablet      TAKE 1 TABLET BY MOUTH EVERY DAY    TAKE 1 TABLET BY MOUTH EVERY DAY   POLYETHYLENE GLYCOL POWDER (GLYCOLAX/MIRALAX) POWDER polyethylene glycol powder (GLYCOLAX/MIRALAX) powder      TAKE 17 GRAMS BY MOUTH TWICE A DAY AS DIRECTED    TAKE 17 GRAMS BY MOUTH TWICE A DAY AS DIRECTED   TAMSULOSIN (FLOMAX) 0.4 MG CAPS Tamsulosin HCl (FLOMAX) 0.4 MG CAPS      Take 1 capsule (0.4 mg total) by mouth 2 (two) times daily.    TAKE ONE CAPSULE EVERY DAY  Discontinued Medications   CEPHALEXIN (KEFLEX) 500 MG CAPSULE    Take 1 capsule (500 mg total) by mouth 4 (four) times daily.

## 2013-04-03 DIAGNOSIS — M169 Osteoarthritis of hip, unspecified: Secondary | ICD-10-CM | POA: Diagnosis not present

## 2013-04-03 DIAGNOSIS — M67919 Unspecified disorder of synovium and tendon, unspecified shoulder: Secondary | ICD-10-CM | POA: Diagnosis not present

## 2013-04-03 DIAGNOSIS — M719 Bursopathy, unspecified: Secondary | ICD-10-CM | POA: Diagnosis not present

## 2013-04-14 DIAGNOSIS — J309 Allergic rhinitis, unspecified: Secondary | ICD-10-CM | POA: Diagnosis not present

## 2013-05-11 DIAGNOSIS — J3089 Other allergic rhinitis: Secondary | ICD-10-CM | POA: Diagnosis not present

## 2013-05-11 DIAGNOSIS — J45909 Unspecified asthma, uncomplicated: Secondary | ICD-10-CM | POA: Diagnosis not present

## 2013-06-07 ENCOUNTER — Ambulatory Visit (INDEPENDENT_AMBULATORY_CARE_PROVIDER_SITE_OTHER): Payer: Medicare Other

## 2013-06-07 DIAGNOSIS — Z23 Encounter for immunization: Secondary | ICD-10-CM

## 2013-07-19 ENCOUNTER — Telehealth: Payer: Self-pay | Admitting: Pulmonary Disease

## 2013-07-19 MED ORDER — LEVOFLOXACIN 500 MG PO TABS: 500.0000 mg | ORAL_TABLET | Freq: Every day | ORAL | Status: DC

## 2013-07-19 NOTE — Telephone Encounter (Signed)
Last visit 03/23/13. I spoke with the pt and he is c/o having sore throat, and sinus congestion starting on Sunday and it has now moved to his chest. He states he has chest congestion and productive cough with green phlegm. He has been using maximum strength mucinex, saline nasal rinse, nasonex, and OTC tussin without relief. Please advise. Carron Curie, CMA Allergies  Allergen Reactions  . Prednisone     REACTION: high dose intolerance

## 2013-07-19 NOTE — Telephone Encounter (Signed)
rx sent and pt is aware. Aleksia Freiman, CMA  

## 2013-07-19 NOTE — Telephone Encounter (Signed)
Per SN---  Cont all of the meds he has been using.  Add levaquin 500 mg  #7  1 daily until gone.  thanks

## 2013-08-14 ENCOUNTER — Other Ambulatory Visit: Payer: Self-pay | Admitting: Pulmonary Disease

## 2013-08-26 ENCOUNTER — Other Ambulatory Visit: Payer: Self-pay | Admitting: Pulmonary Disease

## 2013-09-08 ENCOUNTER — Other Ambulatory Visit: Payer: Self-pay | Admitting: Pulmonary Disease

## 2013-10-04 ENCOUNTER — Encounter: Payer: Self-pay | Admitting: Pulmonary Disease

## 2013-10-04 ENCOUNTER — Other Ambulatory Visit (INDEPENDENT_AMBULATORY_CARE_PROVIDER_SITE_OTHER): Payer: Medicare Other

## 2013-10-04 ENCOUNTER — Ambulatory Visit (INDEPENDENT_AMBULATORY_CARE_PROVIDER_SITE_OTHER): Payer: Medicare Other | Admitting: Pulmonary Disease

## 2013-10-04 VITALS — BP 130/80 | HR 67 | Temp 96.8°F | Ht 74.0 in | Wt 226.8 lb

## 2013-10-04 DIAGNOSIS — F411 Generalized anxiety disorder: Secondary | ICD-10-CM

## 2013-10-04 DIAGNOSIS — E782 Mixed hyperlipidemia: Secondary | ICD-10-CM | POA: Diagnosis not present

## 2013-10-04 DIAGNOSIS — I2510 Atherosclerotic heart disease of native coronary artery without angina pectoris: Secondary | ICD-10-CM | POA: Diagnosis not present

## 2013-10-04 DIAGNOSIS — I10 Essential (primary) hypertension: Secondary | ICD-10-CM | POA: Diagnosis not present

## 2013-10-04 DIAGNOSIS — K5900 Constipation, unspecified: Secondary | ICD-10-CM

## 2013-10-04 DIAGNOSIS — J4489 Other specified chronic obstructive pulmonary disease: Secondary | ICD-10-CM

## 2013-10-04 DIAGNOSIS — Z87898 Personal history of other specified conditions: Secondary | ICD-10-CM

## 2013-10-04 DIAGNOSIS — K3184 Gastroparesis: Secondary | ICD-10-CM

## 2013-10-04 DIAGNOSIS — E119 Type 2 diabetes mellitus without complications: Secondary | ICD-10-CM

## 2013-10-04 DIAGNOSIS — M545 Low back pain, unspecified: Secondary | ICD-10-CM

## 2013-10-04 DIAGNOSIS — J309 Allergic rhinitis, unspecified: Secondary | ICD-10-CM

## 2013-10-04 DIAGNOSIS — M1990 Unspecified osteoarthritis, unspecified site: Secondary | ICD-10-CM

## 2013-10-04 DIAGNOSIS — J449 Chronic obstructive pulmonary disease, unspecified: Secondary | ICD-10-CM | POA: Diagnosis not present

## 2013-10-04 DIAGNOSIS — K5730 Diverticulosis of large intestine without perforation or abscess without bleeding: Secondary | ICD-10-CM | POA: Diagnosis not present

## 2013-10-04 DIAGNOSIS — D126 Benign neoplasm of colon, unspecified: Secondary | ICD-10-CM

## 2013-10-04 DIAGNOSIS — K219 Gastro-esophageal reflux disease without esophagitis: Secondary | ICD-10-CM

## 2013-10-04 LAB — CBC WITH DIFFERENTIAL/PLATELET
BASOS PCT: 0.4 % (ref 0.0–3.0)
Basophils Absolute: 0 10*3/uL (ref 0.0–0.1)
EOS ABS: 0.1 10*3/uL (ref 0.0–0.7)
Eosinophils Relative: 1.1 % (ref 0.0–5.0)
HCT: 41.2 % (ref 39.0–52.0)
Hemoglobin: 13.7 g/dL (ref 13.0–17.0)
LYMPHS PCT: 22.3 % (ref 12.0–46.0)
Lymphs Abs: 1.1 10*3/uL (ref 0.7–4.0)
MCHC: 33.3 g/dL (ref 30.0–36.0)
MCV: 88 fl (ref 78.0–100.0)
MONOS PCT: 6.7 % (ref 3.0–12.0)
Monocytes Absolute: 0.3 10*3/uL (ref 0.1–1.0)
Neutro Abs: 3.3 10*3/uL (ref 1.4–7.7)
Neutrophils Relative %: 69.5 % (ref 43.0–77.0)
Platelets: 187 10*3/uL (ref 150.0–400.0)
RBC: 4.68 Mil/uL (ref 4.22–5.81)
RDW: 13.4 % (ref 11.5–14.6)
WBC: 4.8 10*3/uL (ref 4.5–10.5)

## 2013-10-04 LAB — BASIC METABOLIC PANEL
BUN: 16 mg/dL (ref 6–23)
CALCIUM: 9.6 mg/dL (ref 8.4–10.5)
CHLORIDE: 107 meq/L (ref 96–112)
CO2: 26 mEq/L (ref 19–32)
CREATININE: 0.9 mg/dL (ref 0.4–1.5)
GFR: 93.13 mL/min (ref >60.00)
Glucose, Bld: 129 mg/dL — ABNORMAL HIGH (ref 70–99)
Potassium: 4.5 mEq/L (ref 3.5–5.1)
SODIUM: 139 meq/L (ref 135–145)

## 2013-10-04 LAB — LIPID PANEL
CHOL/HDL RATIO: 3
Cholesterol: 148 mg/dL (ref 0–200)
HDL: 42.5 mg/dL (ref >39.00)
LDL CALC: 94 mg/dL (ref 0–99)
TRIGLYCERIDES: 57 mg/dL (ref 0.0–149.0)
VLDL: 11.4 mg/dL (ref 0.0–40.0)

## 2013-10-04 LAB — HEPATIC FUNCTION PANEL
ALK PHOS: 81 U/L (ref 39–117)
ALT: 20 U/L (ref 0–53)
AST: 25 U/L (ref 0–37)
Albumin: 4.2 g/dL (ref 3.5–5.2)
BILIRUBIN DIRECT: 0.1 mg/dL (ref 0.0–0.3)
BILIRUBIN TOTAL: 0.6 mg/dL (ref 0.3–1.2)
Total Protein: 7.7 g/dL (ref 6.0–8.3)

## 2013-10-04 LAB — HEMOGLOBIN A1C: Hgb A1c MFr Bld: 7 % — ABNORMAL HIGH (ref 4.6–6.5)

## 2013-10-04 LAB — TSH: TSH: 1.06 u[IU]/mL (ref 0.35–5.50)

## 2013-10-04 MED ORDER — NIACIN ER (ANTIHYPERLIPIDEMIC) 500 MG PO TBCR: 500.0000 mg | EXTENDED_RELEASE_TABLET | Freq: Every day | ORAL | Status: DC

## 2013-10-04 MED ORDER — FLUTICASONE-SALMETEROL 115-21 MCG/ACT IN AERO: 1.0000 | INHALATION_SPRAY | Freq: Two times a day (BID) | RESPIRATORY_TRACT | Status: DC

## 2013-10-04 MED ORDER — FLUTICASONE PROPIONATE 50 MCG/ACT NA SUSP: 2.0000 | Freq: Every day | NASAL | Status: DC

## 2013-10-04 MED ORDER — GLUCOSE BLOOD VI STRP: ORAL_STRIP | Status: DC

## 2013-10-04 MED ORDER — PANTOPRAZOLE SODIUM 40 MG PO TBEC: 40.0000 mg | DELAYED_RELEASE_TABLET | Freq: Every day | ORAL | Status: DC

## 2013-10-04 MED ORDER — LOSARTAN POTASSIUM 100 MG PO TABS: ORAL_TABLET | ORAL | Status: DC

## 2013-10-04 NOTE — Progress Notes (Signed)
Subjective:    Patient ID: Jesse Santos, male    DOB: [birth year], 78 y.o.   MRN: [id number]  HPI 78 y/o WM w/ mult med problems here for a follow up visit>   ~  Feb 11, 2012:  6mo ROV & Jesse Santos reports a broken bone in right hand from playing w/ the dog- in short arm cast now (Jesse Santos);  He is in the donut hole & requesting any med changes to generics & we reviewed this esp as it relates to very low cash pay meds;  We decided to change Niaspan to OTC slow release Niacin, and change Diovan160 to Losartan100 & he will follow BP at home after the transition...    He saw Jesse Santos 4/13 after being in the ER 11/29/11 for AtypCP> Myoview was neg- no ischemia or infarct, EF=71%; he has been walking daily & no CP, recommended to stay active & get weight down...    He had recent f/u Jesse Santos for Ophthalmology> s/p cats, no DM retinopathy seen...    We reviewed prob list, meds, xrays and labs> see below>>  CXR 3/13 showed normal heart size, clear lungs, NAD.Marland KitchenMarland Kitchen  EKG 3/13 showed NSR, rate75, WNL, NAD...  LABS 3/13:  Chems- ok x BS=232;  CBC- wnl...  LABS 5/13:  FLP- at goals on Niacin alone;  Chems- ok x BS=132 A1c=7.1 on MetformBid;  TSH=1.42;  PSA=1.04...  ~  September 15, 2012:  61mo ROV & Jesse Santos is c/o some pain in his neck=> we will Rx w/ Tramadol for prn use;  He also notes that his pulse seems "higher" since he started the Losartan, pulse= 87 reg today, BP=140/84, and we discussed adding Aten25mg /d to his med regimen...  We reviewed the following medical problems during today's office visit>>     HOH> he has hearing aides...    AR> on allergy shots, OTC antihist, saline, Nasonex; gets occas bouts of sinusitis but iproved this past yr...    OSA> Sleep Study was in 204 w/ RDI=45 & desat to 88% w/ mod snoring & leg jerks, but he states he never could use the CPAP effectively & he sleeps fine if he stays on his side; denies daytime sleepiness or other issues...    COPD> exsmoker quit 1981; on  AdvairHFA115-2spBid, Mucinex prn; he doesn't like Pred Rx; he had Bronchitic exac treated by Jesse 12/13 w/ Augmentin, Mucinex, etc & improved; needs incr exercise program...    HBP> on Losar100; BP=140/84, he notes incr HR he says & we decided to add low dose ATEN25mg /d...    CAD> on ASA81; followed by Jesse Santos- CT Abd 2010 showed coronary calcif, Treadmill was neg; Myoview 3/13 was neg, wnl...    Hyperlipid> on Niacin500mg ; FLP showed TChol 140, TG 56, HDL 39, LDL 90; continue same, get wt down...    DM> on Metform500Bid; Labs showed BS=134, A1c=7.5; wt=228# (no change) rec to continue same med, better diet, get wt down.[name]    GI- GERD, dysphagia, Divertics, IBS, Polyps> on Protonix40, Miralax, Levsin0.125 prn; followed by Jesse Santos & stable- last colon 5/12 w/ severe divertics & 2 sm adenomas removed...    GU- Kid stone, BPH> on Flomax0.4 & Cialis prn; PSA remains wnl at 1.10    DJD, LBP> left knee complaints w/ shots from Jesse Santos, similar for left shoulder; hx bilat hip AVN noted on prev CT Abd but he has min complaints and uses Tylenol prn...    Anxiety> he does not want anxiolytic meds.. We reviewed prob list, meds,  xrays and labs> see below for updates >> he had the 2013 Flu vaccine 9/13... Meds refilled today per request...  LABS 1/14:  FLP- at goals on Niacin rx;  Chems- ok w/ BS=134, A1c=7.5;  CBC- wnl;  TSH=1.13;  VitD=42;  PSA=1.10      ~  December 02, 2012:  2-54mo ROV & add-on for 1wk hx right ear pain, dizzy, gums sore, aching, HA w/ pressure sensation;  Exam shows afeb, ears clear, TMs ok, sl pharyngeal erythema w/o exud, chest w/ scat rhonchi;  We decided to treat w/ Depo80, Medrol dosepak, & ZPak...  He has Tramadol & Tylenol to use for pain... We reviewed prob list, meds, xrays and labs> see below for updates >>   ~  March 23, 2013:  3-13mo ROV & Jesse Santos has several minor complants> He fell w/ abrasion on left knee w/ resultant cellulitis- went to ER & had f/u w/ Jesse, now resolved after Keflex Rx;  c/o prob w/ BPH & obstructive symptoms w/ nocturia & decr flow despite Flomax0.4mg /d- he would like to incr to Bid before considering Urology referral; also c/o some left hip pain- known AVN from prev evals & usually controlled w/ Tylenol alone; Rec to use the Tramadol + Tylenol; he is established w/ Jesse Santos but hasn't had re-eval in some time & encouraged to f/u w/ Ortho at his convenience...    Breathing is good on AdvairMDI-115-21 at 2spBid + his allergy shots & meds; denies cough, sput, dyspnea, CP, etc...    BP controlled on Aten25, Losar100> BP=128/72 & he denies CP, palpit, SOB, edema, etc...     He saw Jesse Santos 4/14> hx CAD, doing satis on ASA & BP rx w/o angina & rec to follow program of aggressive risk factor reduction, no change in meds...    Lipids are controlled on diet + Niacin;  DM is treated w/ Metform500Bid- he deperately needs to diet, incr exercise & get the weight down; Labs 7/14 showed BS=140, A1c=7.3 We reviewed prob list, meds, xrays and labs> see below for updates >> meds refilled per request today...  LABS 7/14:  Chems- ok x BS=140, A1c=7.3.Marland KitchenMarland Kitchen   ~  October 04, 2013:  75mo ROV & Jesse Santos reports that Jesse Santos plans a left THR (for avasc necreosis) whenever he is ready, but he's not rushing it, on Tramadol prn & doing satis for now...     Breathing is stable on Advair115 MDI along w/ Mucinex, fluids, nasal saline, plus claritin...     BP controlled on Aten25, Losar100 w/ BP= 130/80 & he denies CP, palpit, SOB, edema...    Chol looks good on diet + Niasp500; FLP 1/15 showed TChol 148, TG 57, HDL 43, LDL 94     He remains on Metform500Bid & low carb diet for his DM- weight = 227# (no change w/ BMI=29-30), BS= 129 & A1c= 7.0, and we discussed diet, exercise, & weight reduction strategies...    GI is also stable on Protonix40, Miralax, & Levsin as needed; he denies abd pain, dysphagia, n/v, c/d, blood seen...     GU is ok w/ Flomax0.4.Marland KitchenMarland Kitchen  We reviewed prob list, meds, xrays and labs> see  below for updates >>   LABS 1/15:  FLP- at goals on diet+Niaspan;  Chesm- ok w/ BS=129, A1c=7.0;  CBC- wnl;  TSH=1.06;            Problem List:  HEARING LOSS (ICD-389.9) - he has bilat hearing aides...  ALLERGIC RHINITIS (ICD-477.9) - on allergy shots from  DrESL Gaetano Hawthorne)... plus Claritin, Saline, Mucinex, Flonase. ~  occas bouts of sinusitis requiring antibiotic Rx... ~  8/13: he had allergy f/u DrVanWinkle> skin testing 2010 pos to dust mites and molds; doing well on shots once/mo; stable & no changes made... ~  3/14: he presented w/ an upper resp infection & treated w/ Depo, Dosepak, Zithromax... ~  8/14:  He hd allergy f/u DrVanWinkle> Advair, Flonase, Saline, Mucinex; he checked FENO- reported normal...  OBSTRUCTIVE SLEEP APNEA (ICD-327.23) - sleep study 8/04 showed RDI= 45 & desat to 88%... mod snoring and leg jerks without sleep disruption... CPAP perscribed but not using it now- states he can't sleep w/ it on & furthermore he is resting well as long as he stays on his side... denies daytime hypersomnolence & not interested in re-evaluation.  COPD (ICD-496) - ex-smoker, quit 1981... on ADVAIR 100Bid, PROAIR Prn, MUCINEX Bid... he had Pneumovax in 2008... doesn't like Pred Rx...  ~  10/11:  doing well except recent cough, min green sputum (no change in dyspnea)- we will Rx w/ Augmentin, incr Mucinex/ Fluids. ~  4/12:  Breathing at baseline, continue maintenance meds... ~  CXR 12/13 showed normal heart size, clear lungs, NAD... ~  1/14:  He's had 2 exac this past yr- saw Jesse w/ Rx & improved... ~  1/15:  He continues to do well on Advair + allergy Rx from DrVanWinkle w/ Flonase, Saline, Mucinex...  HYPERTENSION (ICD-401.9) - prev on diet alone> then on Diovan320, but we decided to change to LOSARTAN 100mg /d 5/13 to save $$ ~  He claims that Norvasc & HCTZ "stopped my urine flow"... ~  10/11:  BP= 128/80 & even better at home he says... denies HA, fatigue, visual changes, CP, palipit,  dizziness, syncope, dyspnea, edema, etc... ~  4/12:  Pt has seen Jesse, Jesse Santos, & checking BP daily at home; tried on Norvasc but claims it decr his urine flow; now on Diovan & improved w/ BP= 132/70... ~  5/12:  BP remains well controlled on Diovan 320mg /d= 120/62 today. ~  11/12:  BP= 142/82 on Diovan160 now; denies CP, palpit, dizzy, SOB, edema... ~  5/13:  BP= 128/66 & he's requesting change to cheaper med; rec switch Diovan to LOSARTAN 100mg /d... ~  1/14: on Losar100; BP=140/84, he notes incr HR he says & we decided to add low dose ATENOLOL25mg /d... ~  7/14: on Aten25, Losar100> BP=128/72 & he denies CP, palpit, SOB, edema, etc... ~  1/15: BP controlled on Aten25, Losar100 w/ BP= 130/80 & he denies CP, palpit, SOB, edema.  R/O CAD (ICD-414.00) - on ASA 81mg /d... Followed by Jesse Santos & his notes are reviewed. ~  hx neg cardiac cath 1988 by DrJoe Bullard... ~  NuclearStressTest 10/02 was norm- no scar or ischemia, EF=55%. ~  CT Abd 4/10 via ER showed coronary calcif... referred to Cards. ~  Eval by Jesse Santos 6/10 showed norm EKG & cardiac exam... treadmill ordered. ~  Treadmill 6/10 showed reasonable exerc tolerance, no ischemic changes, few PVCs, sl incr BP... ~  EKG 3/13 showed NSR, rate76, WNL, NAD... ~  Myoview 3/13 was neg> exercised for 70min, stopped for fatigue & CP; no ST seg changes, occas PACs & PVCs; hypertensive BP response; no ischemia, EF=71%, normal wall motion... ~  He saw Jesse Santos 4/14> hx CAD, doing satis on ASA & BP rx w/o angina & rec to follow program of aggressive risk factor reduction, no change in meds.  ~  EKG 4/14 showed NSR, rate60, wnl, NAD...  MIXED  HYPERLIPIDEMIA (ICD-272.2) - low HDL and NIASPAN 500mg /d started 4/09... ~  [place] 4/09 showed TChol 160, TG 106, HDL 26, LDL 113... rec- diet + Niaspan 500/d. ~  FLP 10/09 showed TChol 155, TG 78, HDL 33, LDL 106... ~  FLP 4/10 showed TChol 133, TG 63, HDL 30, LDL 91... rec> continue same. ~  FLP 4/11 showed  TChol 144, TG 61, HDL 38, LDL 94 ~  FLP 10/11 showed TChol 155, TG 81, HDL 40, LDL 99 ~  FLP 4/12 showed TChol 142, TG 69, HDL 38, LDL 90 ~  FLP 11/12 showed TChol 165, TG 82, HDL 43, LDL 106 ~  FLP 5/13 in Niasp500 showed TChol 155, TG 72, HDL 46, LDL 95... rec change to generic. ~  FLP 1/14 on Niacin500 showed TChol 140, TG 56, HDL 39, LDL 90 ~  FLP 1/15 on Niaspan500 showed TChol 148, TG 57, HDL 43, LDL 94   DM (ICD-250.00) - on diet + METFORMIN 500mg Bid... he reports BS at home all in the 120-130's... ~  labs 4/09 showed BS= 132, HgA1c= 6.6.Marland KitchenMarland Kitchen rec- same med, better diet... ~  labs 10/09 showed BS= 131, HgA1c= 6.3.Marland KitchenMarland Kitchen ~  labs 4/10 showed BS= 117, HgA1c= 6.2.[name]. rec> same meds/ diet Rx. ~  labs 10/10 showed BS= 138, A1c= 6.5 ~  labs 4/11 showed BS= 121, A1c= 6.5.Marland KitchenMarland Kitchen On Metform500Bid + diet. ~  Dilated eye exam 5/11 by Jesse Santos- no retinopathy... ~  labs 10/11 (wt=228#) showed BS= 118, A1c= 7.1.[name]. not as good- get on diet or more meds. ~  Labs 4/12 (wt=229#) showed BS= 119, A1c= 7.3... Wrong direction, may need more meds, get wt down! ~  5/12:  Ophthalmology check by Jesse Santos was neg- no retinopathy... ~  Labs 11/12 showed BS= 136, A1c= 7.0 ~  Labs 5/13 on MetformBid showed BS= 132, A1c= 7.1.Marland KitchenMarland Kitchen Continue same, get wt down. ~  Ophthalmology check from Jesse Santos 5/13> no DM retinopathy... ~  Labs 1/14 showed BS= 134, A1c= 7.5; not as good- needs better diet, get wt down, same meds for now... ~  Labs 7/14 on Metform500Bid showed BS= 140, A1c= 7.3  ~  Labs 1/154 on Metform500Bid showed BS= 129 & A1c= 7.0, and we reviewed meds/ diet/ exercise/ weight...  GERD (ICD-530.81) & ? GASTROPARESIS - on PROTONIX 40mg /d, & off Reglan per Jesse Santos... ~   last EGD 6/07 by Jesse Santos showed gastic polyp... ~  8/10:  given Compazine suppos for gastroparesis flair by DrGessner. ~  4/12:  C/o intermittent dysphagia & referred back to GI for further eval (colonoscopy due as well)...  DIVERTICULOSIS OF COLON  (ICD-562.10) - he uses MIRALAX Bid regularly... IRRITABLE BOWEL SYNDROME (ICD-564.1) COLONIC POLYPS (ICD-211.3) ~  last colonoscopy 5/07 by DrSam showed divertics only... f/u 1yrs. ~  CT Abd 4/10 in ER showed atx bases, coronary calcif, s/p GB, abdAo calcif, 39mm renal stone, divertics, bilat hip AVN. ~  Follow up colonoscopy by Jesse Santos 5/12 showed severe diverticulosis, 2 sm polyps= tubular adenoma & f/u planned 5 yrs...  RENAL CALCULUS, HX OF (ICD-V13.01) BENIGN PROSTATIC HYPERTROPHY, HX OF (ICD-V13.8) - on FLOMAX 0.4mg /d w/ improved symptoms (he states flow better since stopping Vit E supplement). ~  4/12:  Routine PSA= 4.9 (it was 1.02 4/11) & we rx w/ Doxy Bid x14d, thewn plan recheck PSA==> 1.30  DEGENERATIVE JOINT DISEASE (ICD-715.90) - pt states "I have a bum knee" and eval by Jesse Santos w/ viscosupplementation shots... Note: Abd CT w/ bilat hip AVN noted... takes TYLENOL Prn... ~  11/10:  s/p left knee arthroscopy by Jesse Santos... ~  10/11:  s/p fall w/ left knee injury- s/p cortisone shot by Jesse Santos; hx shot in left shoulder too. ~  7/14: he is c/o incr pain in left hip area; known hx AVN & needs f/u by Ortho...  BACK PAIN, LUMBAR (ICD-724.2)  ANXIETY (ICD-300.00) - not currently on meds for nerves.  ANEMIA (ICD-285.9) - prev hx of GIB... 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  Health Maintenance: ~  GI: followed by Jesse Santos> Colonoscopy 5/07 by DrSam... f/u planned 5 yrs. ~  GU:  See PSAs recorded above... ~  Immunizations: Pneumovax in 2008 @ age 82;  Tetanus- given 4/10;  he gets yearly seasonal Flu vaccine.   Past Surgical History  Procedure Laterality Date  . Cholecystectomy    . Cataract extraction    . Skin cancer excision  3/10    left hand   . Knee  surgery  11/10    Outpatient Encounter Prescriptions as of 10/04/2013  Medication Sig  . aspirin 81 MG tablet Take 81 mg by mouth daily.    [name] atenolol (TENORMIN) 25 MG tablet TAKE 1 TABLET EVERY DAY  . Cholecalciferol (VITAMIN D) 2000 UNITS CAPS Take 1 capsule by mouth daily.    . COMPRO 25 MG suppository PLACE 1 SUPPOSITORY (25 MG TOTAL) RECTALLY EVERY 12 (TWELVE) HOURS AS NEEDED FOR NAUSEA.  [name] dextromethorphan-guaiFENesin (MUCINEX DM) 30-600 MG per 12 hr tablet Take 1 tablet by mouth every 12 (twelve) hours.  . fluticasone-salmeterol (ADVAIR HFA) 115-21 MCG/ACT inhaler Inhale 1 puff into the lungs 2 (two) times daily.  [name] glucose blood (ONE TOUCH ULTRA TEST) test strip Use as instructed  . glucose blood (ONE TOUCH ULTRA TEST) test strip Test blood glucose levels once a day.  DX:  250.00  . hyoscyamine (LEVSIN SL) 0.125 MG SL tablet Place 1 tablet (0.125 mg total) under the tongue every 4 (four) hours as needed. As needed for abdominal cramping  . levofloxacin (LEVAQUIN) 500 MG tablet Take 1 tablet (500 mg total) by mouth daily.  [name] losartan (COZAAR) 100 MG tablet TAKE 1 TABLET BY MOUTH EVERY DAY  . metFORMIN (GLUCOPHAGE) 500 MG tablet TAKE 1 TABLET TWICE A DAY WITH A MEAL  . mometasone (NASONEX) 50 MCG/ACT nasal spray Place 2 sprays into the nose as needed.  . Multiple Vitamins-Minerals (MULTIVITAMIN WITH MINERALS) tablet Take 1 tablet by mouth daily.    . niacin (NIASPAN) 500 MG CR tablet Take 1 tablet (500 mg total) by mouth at bedtime.  . nitroGLYCERIN (NITROSTAT) 0.4 MG SL tablet Place 1 tablet (0.4 mg total) under the tongue every 5 (five) minutes as needed for chest pain.  . NON FORMULARY Inject as directed every 30 (thirty) days. Allergy shot  . pantoprazole (PROTONIX) 40 MG tablet Take 1 tablet (40 mg total) by mouth daily.  . polyethylene glycol powder (GLYCOLAX/MIRALAX) powder TAKE 17 GRAMS BY MOUTH TWICE A DAY AS DIRECTED  . sodium chloride (OCEAN) 0.65 % nasal spray 1 spray by  Nasal route as needed.    . tadalafil (CIALIS) 20 MG tablet Take 20 mg by mouth daily as needed for erectile dysfunction.  [name]  tamsulosin (FLOMAX) 0.4 MG CAPS Take 1 capsule (0.4 mg total) by mouth 2 (two) times daily.  . traMADol (ULTRAM) 50 MG tablet TAKE 1 TABLET THREE TIMES A DAY AS NEEDED FOR PAIN  . triamcinolone (KENALOG) 0.1 % cream Apply topically 2 (two) times daily. As directed     Allergies  Allergen Reactions  . Prednisone     REACTION: high dose intolerance    Current Medications, Allergies, Past Medical History, Past Surgical History, Family History, and Social History were reviewed in [employer] record.    Review of Systems         See HPI - all other systems neg except as noted... The patient complains of decreased hearing, dyspnea on exertion, headaches, and difficulty walking.  The patient denies anorexia, fever, weight loss, weight gain, vision loss, hoarseness, chest pain, syncope, peripheral edema, prolonged cough, hemoptysis, abdominal pain, melena, hematochezia, severe indigestion/heartburn, hematuria, incontinence, muscle weakness, suspicious skin lesions, transient blindness, depression, unusual weight change, abnormal bleeding, enlarged lymph nodes, and angioedema.     Objective:   Physical Exam      WD, WN, 78 y/o WM in NAD... GENERAL:  Alert & oriented; pleasant & cooperative... HEENT:  [place]/AT, EOM-full, PERRLA, EACs-clear, TMs-wnl, NOSE- pale, congested, THROAT-clear & wnl. NECK:  Supple w/ fairROM; no JVD; normal carotid impulses w/o bruits; no thyromegaly or nodules palpated; no lymphadenopathy. CHEST:  Clear to P & A; without wheezes/ rales/ or rhonchi heard... HEART:  Regular Rhythm; without murmurs/ rubs/ or gallops detected...  ABDOMEN:  Soft & nontender; normal bowel sounds; no organomegaly or masses palpated., no guarding, or rebound EXT: without deformities, mild arthritic changes; no varicose veins/ venous insuffic/ or  edema. NEURO:  CN's intact;  no focal neuro deficits... DERM:  No lesions noted; no rash etc...  RADIOLOGY DATA:  Reviewed in the EPIC EMR & discussed w/ the patient...  LABORATORY DATA:  Reviewed in the EPIC EMR & discussed w/ the patient...   Assessment & Plan:    COPD>  He continues on Advair, Proair rescue, Mucinex; also Claritin, Flonase, Saline; stable continue same Rx...  HBP>  On LOSARTAN 100mg /d & [id number] w/ good control of BP, continue same...  R/O CAD>  Followed by Jesse Santos, and doing satis... continue ASA & risk factor reduction....  Lipids>  Looks satis on his diet + Niacin...  DM>  Control is fair w/ A1c stable at 7.0; he was told there are more meds in his future if he doesn't get his wt down (currently~230#)...  GI>  Per Jesse Santos w/ GERD, ?Gastroparesis, Divertics, IBS, Colon polyps> f/u colon 5/12 w/ divertics & 2 sm adenomas, repeat planned ~60yrs...  ORTHO>  Per Jesse Santos w/ avasc necrosis in left hip & they are watching it for future surg...  Anxiety>  Certainly an issue, he does not want anxiolytic Rx.Marland KitchenMarland Kitchen

## 2013-10-04 NOTE — Patient Instructions (Signed)
Today we updated your med list in our EPIC system...    Continue your current medications the same...    We switched the Nasonex to [facility] for better insurance coverage...    Continue the Tramadol as needed for pain...  Today we did your follow up FASTING blood work...    We will contact you w/ the results when available...   Keep up the good work w/ diet & exercise...  Call for any questions...  Let's plan a follow up visit in 83mo, sooner if needed for problems.Marland KitchenMarland Kitchen

## 2013-10-09 ENCOUNTER — Telehealth: Payer: Self-pay | Admitting: Pulmonary Disease

## 2013-10-09 NOTE — Telephone Encounter (Signed)
Result Note     Please notify patient>     FLP looks good on diet + Niaspan500> keep same...    Chems are ok x BS=129, A1c=7.0 on Metform500/d; Needs better diet & wt loss or else he'll need increase meds...    CBC, Thyroid are wnl...       I spoke with patient about results and he verbalized understanding and had no questions. Nothing further needed

## 2013-11-16 ENCOUNTER — Telehealth: Payer: Self-pay | Admitting: Pulmonary Disease

## 2013-11-16 MED ORDER — LEVOFLOXACIN 500 MG PO TABS: 500.0000 mg | ORAL_TABLET | Freq: Every day | ORAL | Status: DC

## 2013-11-16 NOTE — Telephone Encounter (Signed)
Called spoke with patient and advised of SN's recs as stated below.  Pt okay with these recommendations and verbalized his understanding.  Levaquin sent to verified pharmacy.  Pt is taking Mucinex DM max, so is aware to only continue taking 1 tab BID.  Pt aware to call back if his symptoms do not improve or worsen.  Nothing further needed at this time; will sign off.

## 2013-11-16 NOTE — Telephone Encounter (Signed)
Last 10-04-13. Pt is c/o having sore throat, headache, productive cough with green phlegm, chest congestion, and states chest feels "raw" all x 1 week. Pt has been taking mucinex -DM bid, flonase without relief. Please advise. Buckingham Bing, CMA Allergies  Allergen Reactions  . Prednisone     REACTION: high dose intolerance

## 2013-11-16 NOTE — Telephone Encounter (Signed)
Per SN---  levaquin 500 mg #7  1 daily otc align once daily otc mucinex 2 po bid 600 mg  Increase fluids

## 2013-11-30 DIAGNOSIS — M67919 Unspecified disorder of synovium and tendon, unspecified shoulder: Secondary | ICD-10-CM | POA: Diagnosis not present

## 2013-12-27 ENCOUNTER — Other Ambulatory Visit: Payer: Self-pay | Admitting: Pulmonary Disease

## 2013-12-27 MED ORDER — GLUCOSE BLOOD VI STRP: ORAL_STRIP | Status: DC

## 2014-01-08 ENCOUNTER — Ambulatory Visit (INDEPENDENT_AMBULATORY_CARE_PROVIDER_SITE_OTHER): Payer: Medicare Other | Admitting: Cardiology

## 2014-01-08 ENCOUNTER — Encounter: Payer: Self-pay | Admitting: Cardiology

## 2014-01-08 VITALS — BP 143/71 | HR 70 | Ht 74.0 in | Wt 228.0 lb

## 2014-01-08 DIAGNOSIS — I10 Essential (primary) hypertension: Secondary | ICD-10-CM | POA: Diagnosis not present

## 2014-01-08 DIAGNOSIS — I2510 Atherosclerotic heart disease of native coronary artery without angina pectoris: Secondary | ICD-10-CM

## 2014-01-08 MED ORDER — NITROGLYCERIN 0.4 MG SL SUBL: 0.4000 mg | SUBLINGUAL_TABLET | SUBLINGUAL | Status: AC | PRN

## 2014-01-08 NOTE — Progress Notes (Signed)
HPI The patient presents for  followup of coronary disease.he did have chest pain and stress testing in 2013. There was no evidence of ischemia.  Since then he has had no new cardiovascular symptoms. He works very active in the yard. He denies any symptoms such as chest discomfort.  There has been no new shortness of breath, PND or orthopnea. There have been no reported palpitations, presyncope or syncope.  He does have shoulder pain and neck pain with movement.  This is similar to previous arthritis/joint pains.    Allergies  Allergen Reactions  . Prednisone     REACTION: high dose intolerance    Current Outpatient Prescriptions  Medication Sig Dispense Refill  . aspirin 81 MG tablet Take 81 mg by mouth daily.        [name] atenolol (TENORMIN) 25 MG tablet TAKE 1 TABLET EVERY DAY  30 tablet  11  . Cholecalciferol (VITAMIN D) 2000 UNITS CAPS Take 1 capsule by mouth daily.        . COMPRO 25 MG suppository PLACE 1 SUPPOSITORY (25 MG TOTAL) RECTALLY EVERY 12 (TWELVE) HOURS AS NEEDED FOR NAUSEA.  12 suppository  0  . dextromethorphan-guaiFENesin (MUCINEX DM) 30-600 MG per 12 hr tablet Take 1 tablet by mouth every 12 (twelve) hours.      . fluticasone (FLONASE) 50 MCG/ACT nasal spray Place 2 sprays into both nostrils daily.  16 g  11  . fluticasone-salmeterol (ADVAIR HFA) 115-21 MCG/ACT inhaler Inhale 1 puff into the lungs 2 (two) times daily.  1 Inhaler  11  . glucose blood (ONE TOUCH ULTRA TEST) test strip Test blood sugar once daily as directed  100 each  6  . hyoscyamine (LEVSIN SL) 0.125 MG SL tablet Place 1 tablet (0.125 mg total) under the tongue every 4 (four) hours as needed. As needed for abdominal cramping  90 tablet  5  . levofloxacin (LEVAQUIN) 500 MG tablet Take 1 tablet (500 mg total) by mouth daily.  7 tablet  0  . losartan (COZAAR) 100 MG tablet TAKE 1 TABLET BY MOUTH EVERY DAY  30 tablet  11  . metFORMIN (GLUCOPHAGE) 500 MG tablet TAKE 1 TABLET TWICE A DAY WITH A MEAL  60 tablet   11  . Multiple Vitamins-Minerals (MULTIVITAMIN WITH MINERALS) tablet Take 1 tablet by mouth daily.        . niacin (NIASPAN) 500 MG CR tablet Take 1 tablet (500 mg total) by mouth at bedtime.  30 tablet  11  . nitroGLYCERIN (NITROSTAT) 0.4 MG SL tablet Place 1 tablet (0.4 mg total) under the tongue every 5 (five) minutes as needed for chest pain.  30 tablet  11  . pantoprazole (PROTONIX) 40 MG tablet Take 1 tablet (40 mg total) by mouth daily.  30 tablet  11  . polyethylene glycol powder (GLYCOLAX/MIRALAX) powder TAKE 17 GRAMS BY MOUTH TWICE A DAY AS DIRECTED  1054 g  11  . sodium chloride (OCEAN) 0.65 % nasal spray 1 spray by Nasal route as needed.        . tamsulosin (FLOMAX) 0.4 MG CAPS Take 1 capsule (0.4 mg total) by mouth 2 (two) times daily.  60 capsule  11  . traMADol (ULTRAM) 50 MG tablet TAKE 1 TABLET THREE TIMES A DAY AS NEEDED FOR PAIN  90 tablet  5   No current facility-administered medications for this visit.    Past Medical History  Diagnosis Date  . Hearing loss   . Allergic rhinitis, cause  unspecified   . Obstructive sleep apnea (adult) (pediatric)   . COPD (chronic obstructive pulmonary disease)   . Unspecified essential hypertension   . Mixed hyperlipidemia   . Coronary artery disease     Coronary calcification with negative ETT 2010  . Type II or unspecified type diabetes mellitus without mention of complication, not stated as uncontrolled   . Esophageal reflux   . Diverticulosis of colon (without mention of hemorrhage)   . Irritable bowel syndrome   . Benign neoplasm of colon   . Renal calculus   . BPH (benign prostatic hypertrophy)   . Degenerative joint disease   . Lumbar back pain   . Anxiety   . Anemia     Past Surgical History  Procedure Laterality Date  . Cholecystectomy    . Cataract extraction    . Skin cancer excision  3/10    left hand   . Knee surgery  11/10    ROS:  As stated in the HPI and negative for all other systems.  PHYSICAL  EXAM BP 143/71  Pulse 70  Ht 6\' 2"  (1.88 m)  Wt 228 lb (103.42 kg)  BMI 29.26 kg/m2 GENERAL:  Well appearing NECK:  No jugular venous distention, waveform within normal limits, carotid upstroke brisk and symmetric, no bruits, no thyromegaly LUNGS:  Clear to auscultation bilaterally HEART:  PMI not displaced or sustained,S1 and S2 within normal limits, no S3, no S4, no clicks, no rubs, no murmurs ABD:  Flat, positive bowel sounds normal in frequency in pitch, no bruits, no rebound, no guarding, no midline pulsatile mass, no hepatomegaly, no splenomegaly EXT:  2 plus pulses throughout, no edema, no cyanosis no clubbing  EKG:  NSR, rate 60, axis within normal limits, intervals within normal limits, no acute ST-T wave changes. 01/08/2014  ASSESSMENT AND PLAN  CAD:  The patient has no new sypmtoms.  I will bring the patient back for a POET (Plain Old Exercise Test). This will allow me to screen for obstructive coronary disease, risk stratify and very importantly provide a prescription for exercise.  We will continue with aggressive risk reduction and meds as listed.  HTN:  The blood pressure is at target. No change in medications is indicated. We will continue with therapeutic lifestyle changes (TLC).  DYSLIPIDEMIA:  His LDL was 94 with and HDL of 42.5.  No change in therapy is indicated.

## 2014-01-08 NOTE — Patient Instructions (Signed)
The current medical regimen is effective;  continue present plan and medications.  Your physician has requested that you have an exercise tolerance test. For further information please visit [email or web address]. Please also follow instruction sheet, as given.

## 2014-01-12 ENCOUNTER — Encounter: Payer: Self-pay | Admitting: Adult Health

## 2014-01-12 ENCOUNTER — Ambulatory Visit (INDEPENDENT_AMBULATORY_CARE_PROVIDER_SITE_OTHER): Payer: Medicare Other | Admitting: Adult Health

## 2014-01-12 VITALS — BP 140/74 | HR 71 | Temp 97.9°F | Ht 74.0 in | Wt 226.2 lb

## 2014-01-12 DIAGNOSIS — J449 Chronic obstructive pulmonary disease, unspecified: Secondary | ICD-10-CM | POA: Diagnosis not present

## 2014-01-12 DIAGNOSIS — I2510 Atherosclerotic heart disease of native coronary artery without angina pectoris: Secondary | ICD-10-CM | POA: Diagnosis not present

## 2014-01-12 MED ORDER — AZITHROMYCIN 250 MG PO TABS: ORAL_TABLET | ORAL | Status: AC

## 2014-01-12 NOTE — Progress Notes (Signed)
Subjective:    Patient ID: Jesse Santos, male    DOB: April 18, 1936, 78 y.o.   MRN: [id number]  HPI 78 y/o WM w/ mult med problems here for a follow up visit>   ~  Feb 11, 2012:  58mo ROV & Jesse Santos reports a broken bone in right hand from playing w/ the dog- in short arm cast now (DrDuda);  He is in the donut hole & requesting any med changes to generics & we reviewed this esp as it relates to very low cash pay meds;  We decided to change Niaspan to OTC slow release Niacin, and change Diovan160 to Losartan100 & he will follow BP at home after the transition...    He saw DrHochrein 4/13 after being in the ER 11/29/11 for AtypCP> Myoview was neg- no ischemia or infarct, EF=71%; he has been walking daily & no CP, recommended to stay active & get weight down...    He had recent f/u DrShapiro for Ophthalmology> s/p cats, no DM retinopathy seen...    We reviewed prob list, meds, xrays and labs> see below>> CXR 3/13 showed normal heart size, clear lungs, NAD.Marland KitchenMarland Kitchen EKG 3/13 showed NSR, rate75, WNL, NAD... LABS 3/13:  Chems- ok x BS=232;  CBC- wnl... LABS 5/13:  FLP- at goals on Niacin alone;  Chems- ok x BS=132 A1c=7.1 on MetformBid;  TSH=1.42;  PSA=1.04...  ~  September 15, 2012:  109mo ROV & Jesse Santos is c/o some pain in his neck=> we will Rx w/ Tramadol for prn use;  He also notes that his pulse seems "higher" since he started the Losartan, pulse= 87 reg today, BP=140/84, and we discussed adding Aten25mg /d to his med regimen...  We reviewed the following medical problems during today's office visit>>     HOH> he has hearing aides...    AR> on allergy shots, OTC antihist, saline, Nasonex; gets occas bouts of sinusitis but iproved this past yr...    OSA> Sleep Study was in 204 w/ RDI=45 & desat to 88% w/ mod snoring & leg jerks, but he states he never could use the CPAP effectively & he sleeps fine if he stays on his side; denies daytime sleepiness or other issues...    COPD> exsmoker quit 1981; on AdvairHFA115-2spBid,  Mucinex prn; he doesn't like Pred Rx; he had Bronchitic exac treated by TP 12/13 w/ Augmentin, Mucinex, etc & improved; needs incr exercise program...    HBP> on Losar100; BP=140/84, he notes incr HR he says & we decided to add low dose ATEN25mg /d...    CAD> on ASA81; followed by DrHochrein- CT Abd 2010 showed coronary calcif, Treadmill was neg; Myoview 3/13 was neg, wnl...    Hyperlipid> on Niacin500mg ; FLP showed TChol 140, TG 56, HDL 39, LDL 90; continue same, get wt down...    DM> on Metform500Bid; Labs showed BS=134, A1c=7.5; wt=228# (no change) rec to continue same med, better diet, get wt down.[name]    GI- GERD, dysphagia, Divertics, IBS, Polyps> on Protonix40, Miralax, Levsin0.125 prn; followed by DrPerry & stable- last colon 5/12 w/ severe divertics & 2 sm adenomas removed...    GU- Kid stone, BPH> on Flomax0.4 & Cialis prn; PSA remains wnl at 1.10    DJD, LBP> left knee complaints w/ shots from DrDuda, similar for left shoulder; hx bilat hip AVN noted on prev CT Abd but he has min complaints and uses Tylenol prn...    Anxiety> he does not want anxiolytic meds.. We reviewed prob list, meds, xrays and labs> see  below for updates >> he had the 2013 Flu vaccine 9/13... Meds refilled today per request... LABS 1/14:  FLP- at goals on Niacin rx;  Chems- ok w/ BS=134, A1c=7.5;  CBC- wnl;  TSH=1.13;  VitD=42;  PSA=1.10      ~  December 02, 2012:  2-2mo ROV & add-on for 1wk hx right ear pain, dizzy, gums sore, aching, HA w/ pressure sensation;  Exam shows afeb, ears clear, TMs ok, sl pharyngeal erythema w/o exud, chest w/ scat rhonchi;  We decided to treat w/ Depo80, Medrol dosepak, & ZPak...  He has Tramadol & Tylenol to use for pain... We reviewed prob list, meds, xrays and labs> see below for updates >>   ~  March 23, 2013:  3-16mo ROV & Jesse Santos has several minor complants> He fell w/ abrasion on left knee w/ resultant cellulitis- went to ER & had f/u w/ TP, now resolved after Keflex Rx; c/o prob w/ BPH &  obstructive symptoms w/ nocturia & decr flow despite Flomax0.4mg /d- he would like to incr to Bid before considering Urology referral; also c/o some left hip pain- known AVN from prev evals & usually controlled w/ Tylenol alone; Rec to use the Tramadol + Tylenol; he is established w/ drDuda but hasn't had re-eval in some time & encouraged to f/u w/ Ortho at his convenience...    Breathing is good on AdvairMDI-115-21 at 2spBid + his allergy shots & meds; denies cough, sput, dyspnea, CP, etc...    BP controlled on Aten25, Losar100> BP=128/72 & he denies CP, palpit, SOB, edema, etc...     He saw DrHochrein 4/14> hx CAD, doing satis on ASA & BP rx w/o angina & rec to follow program of aggressive risk factor reduction, no change in meds...    Lipids are controlled on diet + Niacin;  DM is treated w/ Metform500Bid- he deperately needs to diet, incr exercise & get the weight down; Labs 7/14 showed BS=140, A1c=7.3 We reviewed prob list, meds, xrays and labs> see below for updates >> meds refilled per request today... LABS 7/14:  Chems- ok x BS=140, A1c=7.3.Marland KitchenMarland Kitchen   ~  October 04, 2013:  51mo ROV  We reviewed prob list, meds, xrays and labs> see below for updates >>    01/12/2014 Acute OV  Complains of productive cough with brown mucous, and chest tightness, sinus congestion and drainage and body aches x 1week. No fever .  Patient denies any hemoptysis, orthopnea, PND, or leg swelling. No recent travel or antibiotic use.            Problem List:  HEARING LOSS (ICD-389.9) - he has bilat hearing aides...  ALLERGIC RHINITIS (ICD-477.9) - on allergy shots from DrESL Gaetano Hawthorne)... plus Claritin, Saline, Mucinex, Flonase. ~  occas bouts of sinusitis requiring antibiotic Rx... ~  8/13: he had allergy f/u DrVanWinkle> skin testing 2010 pos to dust mites and molds; doing well on shots once/mo; stable & no changes made... ~  3/14: he presented w/ an upper resp infection & treated w/ Depo, Dosepak,  Zithromax...  OBSTRUCTIVE SLEEP APNEA (ICD-327.23) - sleep study 8/04 showed RDI= 45 & desat to 88%... mod snoring and leg jerks without sleep disruption... CPAP perscribed but not using it now- states he can't sleep w/ it on & furthermore he is resting well as long as he stays on his side... denies daytime hypersomnolence & not interested in re-evaluation.  COPD (ICD-496) - ex-smoker, quit 1981... on ADVAIR 100Bid, PROAIR Prn, MUCINEX Bid... he had Pneumovax in  2008... doesn't like Pred Rx...  ~  10/11:  doing well except recent cough, min green sputum (no change in dyspnea)- we will Rx w/ Augmentin, incr Mucinex/ Fluids. ~  4/12:  Breathing at baseline, continue maintenance meds... ~  CXR 12/13 showed normal heart size, clear lungs, NAD... ~  1/14:  He's had 2 exac this past yr- saw TP w/ Rx & improved...  HYPERTENSION (ICD-401.9) - prev on diet alone> then on Diovan320, but we decided to change to LOSARTAN 100mg /d 5/13 to save $$ ~  He claims that Norvasc & HCTZ "stopped my urine flow"... ~  10/11:  BP= 128/80 & even better at home he says... denies HA, fatigue, visual changes, CP, palipit, dizziness, syncope, dyspnea, edema, etc... ~  4/12:  Pt has seen TP, DrHochrein, & checking BP daily at home; tried on Norvasc but claims it decr his urine flow; now on Diovan & improved w/ BP= 132/70... ~  5/12:  BP remains well controlled on Diovan 320mg /d= 120/62 today. ~  11/12:  BP= 142/82 on Diovan160 now; denies CP, palpit, dizzy, SOB, edema... ~  5/13:  BP= 128/66 & he's requesting change to cheaper med; rec switch Diovan to LOSARTAN 100mg /d... ~  1/14: on Losar100; BP=140/84, he notes incr HR he says & we decided to add low dose ATENOLOL25mg /d... ~  7/14: on Aten25, Losar100> BP=128/72 & he denies CP, palpit, SOB, edema, etc...  R/O CAD (ICD-414.00) - on ASA 81mg /d... Followed by DrHochrein & his notes are reviewed. ~  hx neg cardiac cath 1988 by DrJoe Dunnavant... ~  NuclearStressTest 10/02 was  norm- no scar or ischemia, EF=55%. ~  CT Abd 4/10 via ER showed coronary calcif... referred to Cards. ~  Eval by DrHochrein 6/10 showed norm EKG & cardiac exam... treadmill ordered. ~  Treadmill 6/10 showed reasonable exerc tolerance, no ischemic changes, few PVCs, sl incr BP... ~  EKG 3/13 showed NSR, rate76, WNL, NAD... ~  Myoview 3/13 was neg> exercised for 56min, stopped for fatigue & CP; no ST seg changes, occas PACs & PVCs; hypertensive BP response; no ischemia, EF=71%, normal wall motion... ~  He saw DrHochrein 4/14> hx CAD, doing satis on ASA & BP rx w/o angina & rec to follow program of aggressive risk factor reduction, no change in meds.   MIXED HYPERLIPIDEMIA (ICD-272.2) - low HDL and NIASPAN 500mg /d started 4/09... ~  [place] 4/09 showed TChol 160, TG 106, HDL 26, LDL 113... rec- diet + Niaspan 500/d. ~  FLP 10/09 showed TChol 155, TG 78, HDL 33, LDL 106... ~  FLP 4/10 showed TChol 133, TG 63, HDL 30, LDL 91... rec> continue same. ~  FLP 4/11 showed TChol 144, TG 61, HDL 38, LDL 94 ~  FLP 10/11 showed TChol 155, TG 81, HDL 40, LDL 99 ~  FLP 4/12 showed TChol 142, TG 69, HDL 38, LDL 90 ~  FLP 11/12 showed TChol 165, TG 82, HDL 43, LDL 106 ~  FLP 5/13 in Niasp500 showed TChol 155, TG 72, HDL 46, LDL 95... rec change to generic. ~  FLP 1/14 on Niacin500 showed TChol 140, TG 56, HDL 39, LDL 90  DM (ICD-250.00) - on diet + METFORMIN 500mg Bid... he reports BS at home all in the 120-130's... ~  labs 4/09 showed BS= 132, HgA1c= 6.6.Marland KitchenMarland Kitchen rec- same med, better diet... ~  labs 10/09 showed BS= 131, HgA1c= 6.3.Marland KitchenMarland Kitchen ~  labs 4/10 showed BS= 117, HgA1c= 6.2.[name]. rec> same meds/ diet Rx. ~  labs 10/10  showed BS= 138, A1c= 6.5 ~  labs 4/11 showed BS= 121, A1c= 6.5.Marland KitchenMarland Kitchen On Metform500Bid + diet. ~  Dilated eye exam 5/11 by DrShapiro- no retinopathy... ~  labs 10/11 (wt=228#) showed BS= 118, A1c= 7.1.[name]. not as good- get on diet or more meds. ~  Labs 4/12 (wt=229#) showed BS= 119, A1c= 7.3... Wrong direction,  may need more meds, get wt down! ~  5/12:  Ophthalmology check by DrShapiro was neg- no retinopathy... ~  Labs 11/12 showed BS= 136, A1c= 7.0 ~  Labs 5/13 on MetformBid showed BS= 132, A1c= 7.1.Marland KitchenMarland Kitchen Continue same, get wt down. ~  Ophthalmology check from DrShapiro 5/13> no DM retinopathy... ~  Labs 1/14 showed BS= 134, A1c= 7.5; not as good- needs better diet, get wt down, same meds for now... ~  Labs 7/14 on Metform500Bid showed BS= 140, A1c= 7.3   GERD (ICD-530.81) & ? GASTROPARESIS - on PROTONIX 40mg /d, & off Reglan per DrPerry... ~   last EGD 6/07 by DrPerry showed gastic polyp... ~  8/10:  given Compazine suppos for gastroparesis flair by DrGessner. ~  4/12:  C/o intermittent dysphagia & referred back to GI for further eval (colonoscopy due as well)...  DIVERTICULOSIS OF COLON (ICD-562.10) - he uses MIRALAX Bid regularly... IRRITABLE BOWEL SYNDROME (ICD-564.1) COLONIC POLYPS (ICD-211.3) ~  last colonoscopy 5/07 by DrSam showed divertics only... f/u 61yrs. ~  CT Abd 4/10 in ER showed atx bases, coronary calcif, s/p GB, abdAo calcif, 33mm renal stone, divertics, bilat hip AVN. ~  Follow up colonoscopy by DrPerry 5/12 showed severe diverticulosis, 2 sm polyps= tubular adenoma & f/u planned 5 yrs...  RENAL CALCULUS, HX OF (ICD-V13.01) BENIGN PROSTATIC HYPERTROPHY, HX OF (ICD-V13.8) - on FLOMAX 0.4mg /d w/ improved symptoms (he states flow better since stopping Vit E supplement). ~  4/12:  Routine PSA= 4.9 (it was 1.02 4/11) & we rx w/ Doxy Bid x14d, thewn plan recheck PSA==> 1.30  DEGENERATIVE JOINT DISEASE (ICD-715.90) - pt states "I have a bum knee" and eval by DrDuda w/ viscosupplementation shots... Note: Abd CT w/ bilat hip AVN noted... takes TYLENOL Prn... ~  11/10:  s/p left knee arthroscopy by DrDuda... ~  10/11:  s/p fall w/ left knee injury- s/p cortisone shot by DrDuda; hx shot in left shoulder too. ~  7/14: he is c/o incr pain in left hip area; known hx AVN & needs f/u by  Ortho...  BACK PAIN, LUMBAR (ICD-724.2)  ANXIETY (ICD-300.00) - not currently on meds for nerves.  ANEMIA (ICD-285.9) - prev hx of GIB... 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  Health Maintenance: ~  GI: followed by DrPerry> Colonoscopy 5/07 by DrSam... f/u planned 5 yrs. ~  GU:  See PSAs recorded above... ~  Immunizations: Pneumovax in 2008 @ age 42;  Tetanus- given 4/10;  he gets yearly seasonal Flu vaccine.   Past Surgical History  Procedure Laterality Date  . Cholecystectomy    . Cataract extraction    . Skin cancer excision  3/10    left hand   . Knee surgery  11/10    Outpatient Encounter Prescriptions as of 01/12/2014  Medication Sig  . aspirin 81 MG tablet Take 81 mg by mouth daily.    [name]  atenolol (TENORMIN) 25 MG tablet TAKE 1 TABLET EVERY DAY  . Cholecalciferol (VITAMIN D) 2000 UNITS CAPS Take 1 capsule by mouth daily.    . COMPRO 25 MG suppository PLACE 1 SUPPOSITORY (25 MG TOTAL) RECTALLY EVERY 12 (TWELVE) HOURS AS NEEDED FOR NAUSEA.  [name] dextromethorphan-guaiFENesin (MUCINEX DM) 30-600 MG per 12 hr tablet Take 1 tablet by mouth every 12 (twelve) hours.  . fluticasone (FLONASE) 50 MCG/ACT nasal spray Place 2 sprays into both nostrils daily.  . fluticasone-salmeterol (ADVAIR HFA) 115-21 MCG/ACT inhaler Inhale 1 puff into the lungs 2 (two) times daily.  [name] glucose blood (ONE TOUCH ULTRA TEST) test strip Test blood sugar once daily as directed  . hyoscyamine (LEVSIN SL) 0.125 MG SL tablet Place 1 tablet (0.125 mg total) under the tongue every 4 (four) hours as needed. As needed for abdominal cramping  . losartan (COZAAR) 100 MG tablet TAKE 1 TABLET BY MOUTH EVERY DAY  . metFORMIN (GLUCOPHAGE) 500 MG tablet TAKE 1 TABLET TWICE A DAY WITH A MEAL  . Multiple Vitamins-Minerals  (MULTIVITAMIN WITH MINERALS) tablet Take 1 tablet by mouth daily.    . niacin (NIASPAN) 500 MG CR tablet Take 1 tablet (500 mg total) by mouth at bedtime.  . nitroGLYCERIN (NITROSTAT) 0.4 MG SL tablet Place 1 tablet (0.4 mg total) under the tongue every 5 (five) minutes as needed for chest pain.  . pantoprazole (PROTONIX) 40 MG tablet Take 1 tablet (40 mg total) by mouth daily.  . polyethylene glycol powder (GLYCOLAX/MIRALAX) powder TAKE 17 GRAMS BY MOUTH TWICE A DAY AS DIRECTED  . sodium chloride (OCEAN) 0.65 % nasal spray 1 spray by Nasal route as needed.    . tamsulosin (FLOMAX) 0.4 MG CAPS Take 1 capsule (0.4 mg total) by mouth 2 (two) times daily.  . traMADol (ULTRAM) 50 MG tablet TAKE 1 TABLET THREE TIMES A DAY AS NEEDED FOR PAIN  . [DISCONTINUED] levofloxacin (LEVAQUIN) 500 MG tablet Take 1 tablet (500 mg total) by mouth daily.    Allergies  Allergen Reactions  . Prednisone     REACTION: high dose intolerance    Current Medications, Allergies, Past Medical History, Past Surgical History, Family History, and Social History were reviewed in [employer] record.    Review of Systems         See HPI - all other systems neg except as noted...  he patient denies anorexia, fever, weight loss, weight gain, vision loss, hoarseness, chest pain, syncope, peripheral edema, prolonged cough, hemoptysis, abdominal pain, melena, hematochezia, severe indigestion/heartburn, hematuria, incontinence, muscle weakness, suspicious skin lesions, transient blindness, depression, unusual weight change, abnormal bleeding, enlarged lymph nodes, and angioedema.     Objective:   Physical Exam      WD, WN, 78 y/o WM in NAD... GENERAL:  Alert & oriented; pleasant & cooperative... HEENT:  [place]/AT,   EACs-clear, TMs-wnl, NOSE- pale, congested, THROAT-clear & wnl., mild sinus tenderness NECK:  Supple w/ fairROM; no JVD; normal carotid impulses w/o bruits; no thyromegaly or nodules palpated;  no lymphadenopathy. CHEST:  Clear to P & A; without wheezes/ rales/ or rhonchi heard... HEART:  Regular Rhythm; without murmurs/ rubs/ or gallops detected...  ABDOMEN:  Soft & nontender; normal bowel sounds; no organomegaly or masses palpated., no guarding, or rebound EXT: without deformities, mild arthritic changes; no varicose veins/ venous insuffic/ or edema. NEURO:    no focal neuro deficits... DERM:  No lesions noted; no rash etc..[name]

## 2014-01-12 NOTE — Assessment & Plan Note (Signed)
Mild flare with URI /AR   Plan  Zpack take as directed.  Mucinex DM Twice daily  As needed  Cough/congestion  Saline nasal rinses As needed   Dymista 2 puffs daily until sample is gone. Claritin daily As needed  Drainage  Tylenol As needed  Pain  Please contact office for sooner follow up if symptoms do not improve or worsen or seek emergency care

## 2014-01-12 NOTE — Patient Instructions (Signed)
Zpack take as directed.  Mucinex DM Twice daily  As needed  Cough/congestion  Saline nasal rinses As needed   Dymista 2 puffs daily until sample is gone. Claritin daily As needed  Drainage  Tylenol As needed  Pain  Please contact office for sooner follow up if symptoms do not improve or worsen or seek emergency care

## 2014-01-29 ENCOUNTER — Other Ambulatory Visit: Payer: Self-pay | Admitting: Pulmonary Disease

## 2014-02-01 ENCOUNTER — Telehealth: Payer: Self-pay | Admitting: Pulmonary Disease

## 2014-02-01 MED ORDER — MAGIC MOUTHWASH: ORAL | Status: DC

## 2014-02-01 MED ORDER — LEVOFLOXACIN 500 MG PO TABS: 500.0000 mg | ORAL_TABLET | Freq: Every day | ORAL | Status: DC

## 2014-02-01 NOTE — Telephone Encounter (Signed)
Spoke with the pt and notified of recs per SN  He verbalized understanding  Nothing further needed  Rxs were sent to pharm

## 2014-02-01 NOTE — Telephone Encounter (Signed)
Per SN---  levaquin 500 mg  #7  1 daily MMW  #4oz  1 tsp gargle and swallow four times daily as needed.

## 2014-02-01 NOTE — Telephone Encounter (Signed)
Spoke with the pt He was seen by TP for acute visit 01/12/14 and given zpack  He got better, but then 4 days ago started to have chest tightness and non prod cough  He also states every time he swallows, he feels pain in his right ear Had low grade temp last night  Please advise thanks! Allergies  Allergen Reactions  . Prednisone     REACTION: high dose intolerance

## 2014-02-06 ENCOUNTER — Encounter: Payer: Self-pay | Admitting: Adult Health

## 2014-02-06 ENCOUNTER — Ambulatory Visit (INDEPENDENT_AMBULATORY_CARE_PROVIDER_SITE_OTHER): Payer: Medicare Other | Admitting: Adult Health

## 2014-02-06 VITALS — BP 124/82 | HR 77 | Temp 98.4°F | Ht 74.0 in | Wt 228.8 lb

## 2014-02-06 DIAGNOSIS — I2510 Atherosclerotic heart disease of native coronary artery without angina pectoris: Secondary | ICD-10-CM

## 2014-02-06 DIAGNOSIS — J309 Allergic rhinitis, unspecified: Secondary | ICD-10-CM | POA: Diagnosis not present

## 2014-02-06 DIAGNOSIS — M1990 Unspecified osteoarthritis, unspecified site: Secondary | ICD-10-CM | POA: Diagnosis not present

## 2014-02-06 NOTE — Assessment & Plan Note (Signed)
Flare with ?recent sinusitis   Plan  Finish Levaquin .  Claritin 10mg  daily for 5 days , then as needed.  If not improving will need to call back  Please contact office for sooner follow up if symptoms do not improve or worsen or seek emergency care  follow up Dr. Lenna Santos  As planned and As needed

## 2014-02-06 NOTE — Patient Instructions (Signed)
Finish Levaquin .  Take Advil 200mg  2 tabs twice daily for 3 days -take with food.  May use Tramadol As needed  For pain /headache.  Alterante ice and heat to neck .  Claritin 10mg  daily for 5 days , then as needed.  If not improving will need to call back  Please contact office for sooner follow up if symptoms do not improve or worsen or seek emergency care  follow up Dr. Lenna Gilford  As planned and As needed

## 2014-02-06 NOTE — Assessment & Plan Note (Signed)
Neck pain ? DJD /muscle strain   Plan  Take Advil 200mg  2 tabs twice daily for 3 days -take with food.  May use Tramadol As needed  For pain /headache.  Alterante ice and heat to neck .  If not improving will need to call back  Please contact office for sooner follow up if symptoms do not improve or worsen or seek emergency care  follow up Dr. Lenna Gilford  As planned and As needed

## 2014-02-06 NOTE — Progress Notes (Signed)
Subjective:    Patient ID: Jesse Santos, male    DOB: 10-Apr-1936, 78 y.o.   MRN: [id number]  HPI 78 y/o WM w/ mult med problems here for a follow up visit>   ~  Feb 11, 2012:  36mo ROV & Jesse Santos reports a broken bone in right hand from playing w/ the dog- in short arm cast now (DrDuda);  He is in the donut hole & requesting any med changes to generics & we reviewed this esp as it relates to very low cash pay meds;  We decided to change Niaspan to OTC slow release Niacin, and change Diovan160 to Losartan100 & he will follow BP at home after the transition...    He saw DrHochrein 4/13 after being in the ER 11/29/11 for AtypCP> Myoview was neg- no ischemia or infarct, EF=71%; he has been walking daily & no CP, recommended to stay active & get weight down...    He had recent f/u DrShapiro for Ophthalmology> s/p cats, no DM retinopathy seen...    We reviewed prob list, meds, xrays and labs> see below>> CXR 3/13 showed normal heart size, clear lungs, NAD.Marland KitchenMarland Kitchen EKG 3/13 showed NSR, rate75, WNL, NAD... LABS 3/13:  Chems- ok x BS=232;  CBC- wnl... LABS 5/13:  FLP- at goals on Niacin alone;  Chems- ok x BS=132 A1c=7.1 on MetformBid;  TSH=1.42;  PSA=1.04...  ~  September 15, 2012:  50mo ROV & Jesse Santos is c/o some pain in his neck=> we will Rx w/ Tramadol for prn use;  He also notes that his pulse seems "higher" since he started the Losartan, pulse= 87 reg today, BP=140/84, and we discussed adding Aten25mg /d to his med regimen...  We reviewed the following medical problems during today's office visit>>     HOH> he has hearing aides...    AR> on allergy shots, OTC antihist, saline, Nasonex; gets occas bouts of sinusitis but iproved this past yr...    OSA> Sleep Study was in 204 w/ RDI=45 & desat to 88% w/ mod snoring & leg jerks, but he states he never could use the CPAP effectively & he sleeps fine if he stays on his side; denies daytime sleepiness or other issues...    COPD> exsmoker quit 1981; on AdvairHFA115-2spBid,  Mucinex prn; he doesn't like Pred Rx; he had Bronchitic exac treated by TP 12/13 w/ Augmentin, Mucinex, etc & improved; needs incr exercise program...    HBP> on Losar100; BP=140/84, he notes incr HR he says & we decided to add low dose ATEN25mg /d...    CAD> on ASA81; followed by DrHochrein- CT Abd 2010 showed coronary calcif, Treadmill was neg; Myoview 3/13 was neg, wnl...    Hyperlipid> on Niacin500mg ; FLP showed TChol 140, TG 56, HDL 39, LDL 90; continue same, get wt down...    DM> on Metform500Bid; Labs showed BS=134, A1c=7.5; wt=228# (no change) rec to continue same med, better diet, get wt down.[name]    GI- GERD, dysphagia, Divertics, IBS, Polyps> on Protonix40, Miralax, Levsin0.125 prn; followed by DrPerry & stable- last colon 5/12 w/ severe divertics & 2 sm adenomas removed...    GU- Kid stone, BPH> on Flomax0.4 & Cialis prn; PSA remains wnl at 1.10    DJD, LBP> left knee complaints w/ shots from DrDuda, similar for left shoulder; hx bilat hip AVN noted on prev CT Abd but he has min complaints and uses Tylenol prn...    Anxiety> he does not want anxiolytic meds.. We reviewed prob list, meds, xrays and labs> see  below for updates >> he had the 2013 Flu vaccine 9/13... Meds refilled today per request... LABS 1/14:  FLP- at goals on Niacin rx;  Chems- ok w/ BS=134, A1c=7.5;  CBC- wnl;  TSH=1.13;  VitD=42;  PSA=1.10      ~  December 02, 2012:  2-32mo ROV & add-on for 1wk hx right ear pain, dizzy, gums sore, aching, HA w/ pressure sensation;  Exam shows afeb, ears clear, TMs ok, sl pharyngeal erythema w/o exud, chest w/ scat rhonchi;  We decided to treat w/ Depo80, Medrol dosepak, & ZPak...  He has Tramadol & Tylenol to use for pain... We reviewed prob list, meds, xrays and labs> see below for updates >>   ~  March 23, 2013:  3-56mo ROV & Jesse Santos has several minor complants> He fell w/ abrasion on left knee w/ resultant cellulitis- went to ER & had f/u w/ TP, now resolved after Keflex Rx; c/o prob w/ BPH &  obstructive symptoms w/ nocturia & decr flow despite Flomax0.4mg /d- he would like to incr to Bid before considering Urology referral; also c/o some left hip pain- known AVN from prev evals & usually controlled w/ Tylenol alone; Rec to use the Tramadol + Tylenol; he is established w/ drDuda but hasn't had re-eval in some time & encouraged to f/u w/ Ortho at his convenience...    Breathing is good on AdvairMDI-115-21 at 2spBid + his allergy shots & meds; denies cough, sput, dyspnea, CP, etc...    BP controlled on Aten25, Losar100> BP=128/72 & he denies CP, palpit, SOB, edema, etc...     He saw DrHochrein 4/14> hx CAD, doing satis on ASA & BP rx w/o angina & rec to follow program of aggressive risk factor reduction, no change in meds...    Lipids are controlled on diet + Niacin;  DM is treated w/ Metform500Bid- he deperately needs to diet, incr exercise & get the weight down; Labs 7/14 showed BS=140, A1c=7.3 We reviewed prob list, meds, xrays and labs> see below for updates >> meds refilled per request today... LABS 7/14:  Chems- ok x BS=140, A1c=7.3.Marland KitchenMarland Kitchen   ~  October 04, 2013:  31mo ROV  We reviewed prob list, meds, xrays and labs> see below for updates >>    01/12/14  Acute OV  Complains of productive cough with brown mucous, and chest tightness, sinus congestion and drainage and body aches x 1week. No fever .  Patient denies any hemoptysis, orthopnea, PND, or leg swelling. No recent travel or antibiotic use.    >>zpack   02/06/2014 Acute OV  Complains of  HA, pressure/discomfort in right ear, body aches x10 days.  Neck is sore at times. Ears feels full . Took claritin today . No sign cough or congestion .  No sinus pain or congestion .  Says hear started hurting [place] he in [place].  Was called in [place]- to finish tomorrow.  Patient denies any chest pain, orthopnea, PND, or leg swelling.          Problem List:  HEARING LOSS (ICD-389.9) - he has bilat hearing aides...  ALLERGIC  RHINITIS (ICD-477.9) - on allergy shots from DrESL Gaetano Hawthorne)... plus Claritin, Saline, Mucinex, Flonase. ~  occas bouts of sinusitis requiring antibiotic Rx... ~  8/13: he had allergy f/u DrVanWinkle> skin testing 2010 pos to dust mites and molds; doing well on shots once/mo; stable & no changes made... ~  3/14: he presented w/ an upper resp infection & treated w/ Depo, Dosepak, Zithromax.Marland KitchenMarland Kitchen  OBSTRUCTIVE SLEEP APNEA (ICD-327.23) - sleep study 8/04 showed RDI= 45 & desat to 88%... mod snoring and leg jerks without sleep disruption... CPAP perscribed but not using it now- states he can't sleep w/ it on & furthermore he is resting well as long as he stays on his side... denies daytime hypersomnolence & not interested in re-evaluation.  COPD (ICD-496) - ex-smoker, quit 1981... on ADVAIR 100Bid, PROAIR Prn, MUCINEX Bid... he had Pneumovax in 2008... doesn't like Pred Rx...  ~  10/11:  doing well except recent cough, min green sputum (no change in dyspnea)- we will Rx w/ Augmentin, incr Mucinex/ Fluids. ~  4/12:  Breathing at baseline, continue maintenance meds... ~  CXR 12/13 showed normal heart size, clear lungs, NAD... ~  1/14:  He's had 2 exac this past yr- saw TP w/ Rx & improved...  HYPERTENSION (ICD-401.9) - prev on diet alone> then on Diovan320, but we decided to change to LOSARTAN 100mg /d 5/13 to save $$ ~  He claims that Norvasc & HCTZ "stopped my urine flow"... ~  10/11:  BP= 128/80 & even better at home he says... denies HA, fatigue, visual changes, CP, palipit, dizziness, syncope, dyspnea, edema, etc... ~  4/12:  Pt has seen TP, DrHochrein, & checking BP daily at home; tried on Norvasc but claims it decr his urine flow; now on Diovan & improved w/ BP= 132/70... ~  5/12:  BP remains well controlled on Diovan 320mg /d= 120/62 today. ~  11/12:  BP= 142/82 on Diovan160 now; denies CP, palpit, dizzy, SOB, edema... ~  5/13:  BP= 128/66 & he's requesting change to cheaper med; rec switch Diovan to  LOSARTAN 100mg /d... ~  1/14: on Losar100; BP=140/84, he notes incr HR he says & we decided to add low dose ATENOLOL25mg /d... ~  7/14: on Aten25, Losar100> BP=128/72 & he denies CP, palpit, SOB, edema, etc...  R/O CAD (ICD-414.00) - on ASA 81mg /d... Followed by DrHochrein & his notes are reviewed. ~  hx neg cardiac cath 1988 by DrJoe Hedrick... ~  NuclearStressTest 10/02 was norm- no scar or ischemia, EF=55%. ~  CT Abd 4/10 via ER showed coronary calcif... referred to Cards. ~  Eval by DrHochrein 6/10 showed norm EKG & cardiac exam... treadmill ordered. ~  Treadmill 6/10 showed reasonable exerc tolerance, no ischemic changes, few PVCs, sl incr BP... ~  EKG 3/13 showed NSR, rate76, WNL, NAD... ~  Myoview 3/13 was neg> exercised for 61min, stopped for fatigue & CP; no ST seg changes, occas PACs & PVCs; hypertensive BP response; no ischemia, EF=71%, normal wall motion... ~  He saw DrHochrein 4/14> hx CAD, doing satis on ASA & BP rx w/o angina & rec to follow program of aggressive risk factor reduction, no change in meds.   MIXED HYPERLIPIDEMIA (ICD-272.2) - low HDL and NIASPAN 500mg /d started 4/09... ~  [place] 4/09 showed TChol 160, TG 106, HDL 26, LDL 113... rec- diet + Niaspan 500/d. ~  FLP 10/09 showed TChol 155, TG 78, HDL 33, LDL 106... ~  FLP 4/10 showed TChol 133, TG 63, HDL 30, LDL 91... rec> continue same. ~  FLP 4/11 showed TChol 144, TG 61, HDL 38, LDL 94 ~  FLP 10/11 showed TChol 155, TG 81, HDL 40, LDL 99 ~  FLP 4/12 showed TChol 142, TG 69, HDL 38, LDL 90 ~  FLP 11/12 showed TChol 165, TG 82, HDL 43, LDL 106 ~  FLP 5/13 in Niasp500 showed TChol 155, TG 72, HDL 46, LDL 95... rec  change to generic. ~  FLP 1/14 on Niacin500 showed TChol 140, TG 56, HDL 39, LDL 90  DM (ICD-250.00) - on diet + METFORMIN 500mg Bid... he reports BS at home all in the 120-130's... ~  labs 4/09 showed BS= 132, HgA1c= 6.6.Marland KitchenMarland Kitchen rec- same med, better diet... ~  labs 10/09 showed BS= 131, HgA1c= 6.3.Marland KitchenMarland Kitchen ~  labs 4/10  showed BS= 117, HgA1c= 6.2.[name]. rec> same meds/ diet Rx. ~  labs 10/10 showed BS= 138, A1c= 6.5 ~  labs 4/11 showed BS= 121, A1c= 6.5.Marland KitchenMarland Kitchen On Metform500Bid + diet. ~  Dilated eye exam 5/11 by DrShapiro- no retinopathy... ~  labs 10/11 (wt=228#) showed BS= 118, A1c= 7.1.[name]. not as good- get on diet or more meds. ~  Labs 4/12 (wt=229#) showed BS= 119, A1c= 7.3... Wrong direction, may need more meds, get wt down! ~  5/12:  Ophthalmology check by DrShapiro was neg- no retinopathy... ~  Labs 11/12 showed BS= 136, A1c= 7.0 ~  Labs 5/13 on MetformBid showed BS= 132, A1c= 7.1.Marland KitchenMarland Kitchen Continue same, get wt down. ~  Ophthalmology check from DrShapiro 5/13> no DM retinopathy... ~  Labs 1/14 showed BS= 134, A1c= 7.5; not as good- needs better diet, get wt down, same meds for now... ~  Labs 7/14 on Metform500Bid showed BS= 140, A1c= 7.3   GERD (ICD-530.81) & ? GASTROPARESIS - on PROTONIX 40mg /d, & off Reglan per DrPerry... ~   last EGD 6/07 by DrPerry showed gastic polyp... ~  8/10:  given Compazine suppos for gastroparesis flair by DrGessner. ~  4/12:  C/o intermittent dysphagia & referred back to GI for further eval (colonoscopy due as well)...  DIVERTICULOSIS OF COLON (ICD-562.10) - he uses MIRALAX Bid regularly... IRRITABLE BOWEL SYNDROME (ICD-564.1) COLONIC POLYPS (ICD-211.3) ~  last colonoscopy 5/07 by DrSam showed divertics only... f/u 99yrs. ~  CT Abd 4/10 in ER showed atx bases, coronary calcif, s/p GB, abdAo calcif, 45mm renal stone, divertics, bilat hip AVN. ~  Follow up colonoscopy by DrPerry 5/12 showed severe diverticulosis, 2 sm polyps= tubular adenoma & f/u planned 5 yrs...  RENAL CALCULUS, HX OF (ICD-V13.01) BENIGN PROSTATIC HYPERTROPHY, HX OF (ICD-V13.8) - on FLOMAX 0.4mg /d w/ improved symptoms (he states flow better since stopping Vit E supplement). ~  4/12:  Routine PSA= 4.9 (it was 1.02 4/11) & we rx w/ Doxy Bid x14d, thewn plan recheck PSA==> 1.30  DEGENERATIVE JOINT DISEASE (ICD-715.90) -  pt states "I have a bum knee" and eval by DrDuda w/ viscosupplementation shots... Note: Abd CT w/ bilat hip AVN noted... takes TYLENOL Prn... ~  11/10:  s/p left knee arthroscopy by DrDuda... ~  10/11:  s/p fall w/ left knee injury- s/p cortisone shot by DrDuda; hx shot in left shoulder too. ~  7/14: he is c/o incr pain in left hip area; known hx AVN & needs f/u by Ortho...  BACK PAIN, LUMBAR (ICD-724.2)  ANXIETY (ICD-300.00) - not currently on meds for nerves.  ANEMIA (ICD-285.9) - prev hx of GIB... 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  Health Maintenance: ~  GI: followed by DrPerry> Colonoscopy 5/07 by DrSam... f/u planned 5 yrs. ~  GU:  See PSAs recorded above... ~  Immunizations: Pneumovax in 2008 @ age 76;  Tetanus- given 4/10;  he gets yearly seasonal Flu vaccine.   Past Surgical History  Procedure Laterality Date  . Cholecystectomy    . Cataract extraction    . Skin cancer excision  3/10    left hand   . Knee surgery  11/10    Outpatient Encounter Prescriptions as of 02/06/2014  Medication Sig  . Alum & Mag Hydroxide-Simeth (MAGIC MOUTHWASH) SOLN 1 tsp gargle and swallow 4 times daily as needed  . aspirin 81 MG tablet Take 81 mg by mouth daily.    [name] atenolol (TENORMIN) 25 MG tablet TAKE 1 TABLET EVERY DAY  . Cholecalciferol (VITAMIN D) 2000 UNITS CAPS Take 1 capsule by mouth daily.    . COMPRO 25 MG suppository PLACE 1 SUPPOSITORY (25 MG TOTAL) RECTALLY EVERY 12 (TWELVE) HOURS AS NEEDED FOR NAUSEA.  [name] dextromethorphan-guaiFENesin (MUCINEX DM) 30-600 MG per 12 hr tablet Take 1 tablet by mouth every 12 (twelve) hours.  . fluticasone (FLONASE) 50 MCG/ACT nasal spray Place 2 sprays into both nostrils daily.  . fluticasone-salmeterol (ADVAIR HFA) 115-21 MCG/ACT inhaler Inhale 1 puff  into the lungs 2 (two) times daily.  [name] glucose blood (ONE TOUCH ULTRA TEST) test strip Test blood sugar once daily as directed  . hyoscyamine (LEVSIN SL) 0.125 MG SL tablet Place 1 tablet (0.125 mg total) under the tongue every 4 (four) hours as needed. As needed for abdominal cramping  . levofloxacin (LEVAQUIN) 500 MG tablet Take 1 tablet (500 mg total) by mouth daily.  [name] losartan (COZAAR) 100 MG tablet TAKE 1 TABLET BY MOUTH EVERY DAY  . metFORMIN (GLUCOPHAGE) 500 MG tablet TAKE 1 TABLET TWICE A DAY WITH A MEAL  . Multiple Vitamins-Minerals (MULTIVITAMIN WITH MINERALS) tablet Take 1 tablet by mouth daily.    . niacin (NIASPAN) 500 MG CR tablet Take 1 tablet (500 mg total) by mouth at bedtime.  . nitroGLYCERIN (NITROSTAT) 0.4 MG SL tablet Place 1 tablet (0.4 mg total) under the tongue every 5 (five) minutes as needed for chest pain.  . pantoprazole (PROTONIX) 40 MG tablet Take 1 tablet (40 mg total) by mouth daily.  . polyethylene glycol powder (GLYCOLAX/MIRALAX) powder TAKE 17 GRAMS BY MOUTH TWICE A DAY AS DIRECTED  . sodium chloride (OCEAN) 0.65 % nasal spray 1 spray by Nasal route as needed.    . tamsulosin (FLOMAX) 0.4 MG CAPS Take 1 capsule (0.4 mg total) by mouth 2 (two) times daily.  . traMADol (ULTRAM) 50 MG tablet TAKE 1 TABLET THREE TIMES A DAY AS NEEDED FOR PAIN  . [DISCONTINUED] losartan (COZAAR) 100 MG tablet TAKE 1 TABLET BY MOUTH EVERY DAY    Allergies  Allergen Reactions  . Prednisone     REACTION: high dose intolerance    Current Medications, Allergies, Past Medical History, Past Surgical History, Family History, and Social History were reviewed in [employer] record.    Review of Systems         See HPI - all other systems neg except as noted...  he patient denies anorexia, fever, weight loss, weight gain, vision loss, hoarseness, chest pain, syncope, peripheral edema, prolonged cough, hemoptysis, abdominal pain, melena, hematochezia, severe  indigestion/heartburn, hematuria, incontinence, muscle weakness, suspicious skin lesions, transient blindness, depression, unusual weight change, abnormal bleeding, enlarged lymph nodes, and angioedema.     Objective:   Physical Exam      WD, WN, 78 y/o WM in NAD... GENERAL:  Alert & oriented; pleasant & cooperative... HEENT:  [place]/AT,  EACs-clear, TMs-wnl, NOSE- pale, congested, THROAT-clear & wnl., non tender sinus  NECK:  Supple w/ fairROM; no JVD; normal carotid impulses w/o bruits; no thyromegaly or nodules palpated; no lymphadenopathy. CHEST:  Clear to P & A; without wheezes/ rales/ or rhonchi heard... HEART:  Regular Rhythm; without murmurs/ rubs/ or gallops detected...  ABDOMEN:  Soft & nontender; normal bowel sounds; no organomegaly or masses palpated., no guarding, or rebound EXT: without deformities, mild arthritic changes; no varicose veins/ venous insuffic/ or edema., tender along right lateral neck . Decreased ROM, neg nuchal rigidity.  NEURO:    no focal neuro deficits, CN 2-12 intact , PERRLA , EOMI w/o nystagmus, nml grips, nml gait.  DERM:  No lesions noted; no rash etc..[name]

## 2014-02-12 ENCOUNTER — Ambulatory Visit (INDEPENDENT_AMBULATORY_CARE_PROVIDER_SITE_OTHER): Payer: Medicare Other | Admitting: Physician Assistant

## 2014-02-12 DIAGNOSIS — I2510 Atherosclerotic heart disease of native coronary artery without angina pectoris: Secondary | ICD-10-CM | POA: Diagnosis not present

## 2014-02-12 DIAGNOSIS — R9439 Abnormal result of other cardiovascular function study: Secondary | ICD-10-CM

## 2014-02-12 NOTE — Progress Notes (Signed)
Exercise Treadmill Test  Pre-Exercise Testing Evaluation Rhythm: normal sinus  Rate: 69 bpm     Test  Exercise Tolerance Test Ordering MD: Marijo File, MD  Interpreting MD: Richardson Dopp, PA-C  Unique Test No: 1  Treadmill:  1  Indication for ETT: known ASHD  Contraindication to ETT: No   Stress Modality: exercise - treadmill  Cardiac Imaging Performed: non   Protocol: standard Bruce - maximal  Max BP:  210/63  Max MPHR (bpm):  144 85% MPR (bpm):  122  MPHR obtained (bpm):  153 % MPHR obtained:  106  Reached 85% MPHR (min:sec):  3:42 Total Exercise Time (min-sec):  6:00  Workload in METS:  7.0 Borg Scale: 18  Reason ETT Terminated:  desired heart rate attained    ST Segment Analysis At Rest: normal ST segments - no evidence of significant ST depression With Exercise: borderline ST changes  Other Information Arrhythmia:  No Angina during ETT:  absent (0) Quality of ETT:  indeterminate  ETT Interpretation:  borderline (indeterminate) with non-specific ST changes  Comments: Good exercise capacity. No chest pain. Hypertensive BP response to exercise. There was borderline ST depression in lateral leads at peak exercise. There was a run of ATach at peak exercise.  There was good HR recovery post exercise.  Recommendations: With borderline ST depression will arrange ETT-Myoview. F/u with Dr. Minus Breeding as planned. Signed,  Richardson Dopp, PA-C   02/12/2014 11:18 AM

## 2014-02-20 ENCOUNTER — Encounter: Payer: Self-pay | Admitting: Cardiology

## 2014-02-26 ENCOUNTER — Ambulatory Visit (HOSPITAL_COMMUNITY): Payer: Medicare Other | Attending: Cardiology | Admitting: Radiology

## 2014-02-26 VITALS — BP 129/62 | Ht 74.0 in | Wt 224.0 lb

## 2014-02-26 DIAGNOSIS — R002 Palpitations: Secondary | ICD-10-CM | POA: Insufficient documentation

## 2014-02-26 DIAGNOSIS — J449 Chronic obstructive pulmonary disease, unspecified: Secondary | ICD-10-CM | POA: Insufficient documentation

## 2014-02-26 DIAGNOSIS — I2510 Atherosclerotic heart disease of native coronary artery without angina pectoris: Secondary | ICD-10-CM

## 2014-02-26 DIAGNOSIS — J4489 Other specified chronic obstructive pulmonary disease: Secondary | ICD-10-CM | POA: Insufficient documentation

## 2014-02-26 DIAGNOSIS — E119 Type 2 diabetes mellitus without complications: Secondary | ICD-10-CM | POA: Diagnosis not present

## 2014-02-26 DIAGNOSIS — R9439 Abnormal result of other cardiovascular function study: Secondary | ICD-10-CM

## 2014-02-26 DIAGNOSIS — Z87891 Personal history of nicotine dependence: Secondary | ICD-10-CM | POA: Insufficient documentation

## 2014-02-26 DIAGNOSIS — I10 Essential (primary) hypertension: Secondary | ICD-10-CM | POA: Insufficient documentation

## 2014-02-26 DIAGNOSIS — R0789 Other chest pain: Secondary | ICD-10-CM | POA: Diagnosis not present

## 2014-02-26 MED ORDER — TECHNETIUM TC 99M SESTAMIBI GENERIC - CARDIOLITE
10.0000 | Freq: Once | INTRAVENOUS | Status: AC | PRN
  Administered 2014-02-26: 10 via INTRAVENOUS

## 2014-02-26 MED ORDER — TECHNETIUM TC 99M SESTAMIBI GENERIC - CARDIOLITE
30.0000 | Freq: Once | INTRAVENOUS | Status: AC | PRN
  Administered 2014-02-26: 30 via INTRAVENOUS

## 2014-02-26 NOTE — Progress Notes (Signed)
[facility] [address] [place], [place] [address] [phone]    Cardiology Nuclear Med Study  CLEARANCE Jesse Santos is a 78 y.o. male     MRN : [id number]     DOB: [birth year]  Procedure Date: 02/26/2014  Nuclear Med Background Indication for Stress Test:  Evaluation for Ischemia, and 02-12-2014 Abnormal GXT with ST changes, (peak HR 153= 106%) History:  COPD and No H/O CAD CATH-Cardiac CT: Calcification, MPI: 12/08/11 EF: 71%, 02/12/14 GXT: BL ST Depression Cardiac Risk Factors: History of Smoking, Hypertension, Lipids and NIDDM  Symptoms:  Chest Pain and Chest Tightness, and Palpitations   Nuclear Pre-Procedure Caffeine/Decaff Intake:  None> 12 hrs NPO After: 7:00am   Lungs:  clear O2 Sat: 98% on room air. IV 0.9% NS with Angio Cath:  22g  IV Site: R Antecubital x 1, tolerated well IV Started by:  Irven Baltimore, RN  Chest Size (in):  46 Cup Size: n/a  Height: 6\' 2"  (1.88 m)  Weight:  224 lb (101.606 kg)  BMI:  Body mass index is 28.75 kg/(m^2). Tech Comments:  Patient held Atenolol x 36 hrs. Patient took Advair, and Metformin with breakfast. Irven Baltimore, RN.    Nuclear Med Study 1 or 2 day study: 1 day  Stress Test Type:  Stress  Reading MD: N/A  Order Authorizing Provider:  Minus Breeding, MD  Resting Radionuclide: Technetium 37m Sestamibi  Resting Radionuclide Dose: 11.0 mCi   Stress Radionuclide:  Technetium 38m Sestamibi  Stress Radionuclide Dose: 33.0 mCi           Stress Protocol Rest HR: 66 Stress HR: 139  Rest BP: 129/62 Stress BP: 177/66  Exercise Time (min): 5:30 METS: 7.0   Predicted Max HR: 144 bpm % Max HR: 96.53 bpm Rate Pressure Product: [id number]   Dose of Adenosine (mg):  n/a Dose of Lexiscan: n/a mg  Dose of Atropine (mg): n/a Dose of Dobutamine: n/a mcg/kg/min (at max HR)  Stress Test Technologist: Perrin Maltese, EMT-P  Nuclear Technologist:  Charlton Amor, CNMT     Rest Procedure:  Myocardial perfusion imaging was  performed at rest 45 minutes following the intravenous administration of Technetium 48m Sestamibi. Rest ECG: NSR - Normal EKG  Stress Procedure:  The patient exercised on the treadmill utilizing the Bruce Protocol for 5:30 minutes. The patient stopped due to sob and denied any chest pain.  Technetium 71m Sestamibi was injected at peak exercise and myocardial perfusion imaging was performed after a brief delay. Stress ECG: No significant change from baseline ECG  QPS Raw Data Images:  Normal; no motion artifact; normal heart/lung ratio. Stress Images:  Normal homogeneous uptake in all areas of the myocardium. Rest Images:  There is decreased uptake in the inferior wall. Subtraction (SDS):  No evidence of ischemia. Transient Ischemic Dilatation (Normal <1.22):  0.83 Lung/Heart Ratio (Normal <0.45):  0.36  Quantitative Gated Spect Images QGS EDV:  97 ml QGS ESV:  30 ml  Impression Exercise Capacity:  Good exercise capacity. BP Response:  Normal blood pressure response. Clinical Symptoms:  No chest pain. ECG Impression:  No significant ST segment change suggestive of ischemia. Comparison with Prior Nuclear Study: No significant change from previous study  Overall Impression:  Normal stress nuclear study. No evidence of ischemia.  Decreased uptake of isotope in the inferior wall on the resting images is unchanged from prior study 2013.  LV Ejection Fraction: 69%.  LV Wall Motion:  NL LV Function; NL  Wall Motion   Darlin Coco MD

## 2014-02-27 ENCOUNTER — Telehealth: Payer: Self-pay | Admitting: *Deleted

## 2014-02-27 ENCOUNTER — Encounter: Payer: Self-pay | Admitting: Physician Assistant

## 2014-02-27 NOTE — Telephone Encounter (Signed)
pt notified about normal myoview with verbal understanding

## 2014-03-06 DIAGNOSIS — D1801 Hemangioma of skin and subcutaneous tissue: Secondary | ICD-10-CM | POA: Diagnosis not present

## 2014-03-06 DIAGNOSIS — D235 Other benign neoplasm of skin of trunk: Secondary | ICD-10-CM | POA: Diagnosis not present

## 2014-03-06 DIAGNOSIS — L821 Other seborrheic keratosis: Secondary | ICD-10-CM | POA: Diagnosis not present

## 2014-03-11 ENCOUNTER — Other Ambulatory Visit: Payer: Self-pay | Admitting: Pulmonary Disease

## 2014-03-19 DIAGNOSIS — Z961 Presence of intraocular lens: Secondary | ICD-10-CM | POA: Diagnosis not present

## 2014-03-19 DIAGNOSIS — E119 Type 2 diabetes mellitus without complications: Secondary | ICD-10-CM | POA: Diagnosis not present

## 2014-04-05 ENCOUNTER — Encounter: Payer: Self-pay | Admitting: Pulmonary Disease

## 2014-04-05 ENCOUNTER — Other Ambulatory Visit (INDEPENDENT_AMBULATORY_CARE_PROVIDER_SITE_OTHER): Payer: Medicare Other

## 2014-04-05 ENCOUNTER — Ambulatory Visit (INDEPENDENT_AMBULATORY_CARE_PROVIDER_SITE_OTHER): Payer: Medicare Other | Admitting: Pulmonary Disease

## 2014-04-05 VITALS — BP 124/70 | HR 63 | Temp 98.7°F | Ht 74.0 in | Wt 224.2 lb

## 2014-04-05 DIAGNOSIS — Z23 Encounter for immunization: Secondary | ICD-10-CM

## 2014-04-05 DIAGNOSIS — M545 Low back pain, unspecified: Secondary | ICD-10-CM

## 2014-04-05 DIAGNOSIS — I2510 Atherosclerotic heart disease of native coronary artery without angina pectoris: Secondary | ICD-10-CM | POA: Diagnosis not present

## 2014-04-05 DIAGNOSIS — G4733 Obstructive sleep apnea (adult) (pediatric): Secondary | ICD-10-CM

## 2014-04-05 DIAGNOSIS — K5900 Constipation, unspecified: Secondary | ICD-10-CM

## 2014-04-05 DIAGNOSIS — M1990 Unspecified osteoarthritis, unspecified site: Secondary | ICD-10-CM

## 2014-04-05 DIAGNOSIS — F411 Generalized anxiety disorder: Secondary | ICD-10-CM

## 2014-04-05 DIAGNOSIS — E538 Deficiency of other specified B group vitamins: Secondary | ICD-10-CM | POA: Diagnosis not present

## 2014-04-05 DIAGNOSIS — K5730 Diverticulosis of large intestine without perforation or abscess without bleeding: Secondary | ICD-10-CM

## 2014-04-05 DIAGNOSIS — I10 Essential (primary) hypertension: Secondary | ICD-10-CM | POA: Diagnosis not present

## 2014-04-05 DIAGNOSIS — E782 Mixed hyperlipidemia: Secondary | ICD-10-CM

## 2014-04-05 DIAGNOSIS — D126 Benign neoplasm of colon, unspecified: Secondary | ICD-10-CM

## 2014-04-05 DIAGNOSIS — K219 Gastro-esophageal reflux disease without esophagitis: Secondary | ICD-10-CM

## 2014-04-05 DIAGNOSIS — J449 Chronic obstructive pulmonary disease, unspecified: Secondary | ICD-10-CM

## 2014-04-05 DIAGNOSIS — E119 Type 2 diabetes mellitus without complications: Secondary | ICD-10-CM | POA: Diagnosis not present

## 2014-04-05 DIAGNOSIS — K3184 Gastroparesis: Secondary | ICD-10-CM

## 2014-04-05 LAB — BASIC METABOLIC PANEL
BUN: 20 mg/dL (ref 6–23)
CHLORIDE: 108 meq/L (ref 96–112)
CO2: 26 mEq/L (ref 19–32)
CREATININE: 0.8 mg/dL (ref 0.4–1.5)
Calcium: 9.3 mg/dL (ref 8.4–10.5)
GFR: 101.21 mL/min (ref >60.00)
Glucose, Bld: 126 mg/dL — ABNORMAL HIGH (ref 70–99)
Potassium: 4.5 mEq/L (ref 3.5–5.1)
Sodium: 139 mEq/L (ref 135–145)

## 2014-04-05 LAB — HEMOGLOBIN A1C: HEMOGLOBIN A1C: 7.1 % — AB (ref 4.6–6.5)

## 2014-04-05 LAB — VITAMIN B12: VITAMIN B 12: 447 pg/mL (ref 211–911)

## 2014-04-05 MED ORDER — TAMSULOSIN HCL 0.4 MG PO CAPS: ORAL_CAPSULE | ORAL | Status: DC

## 2014-04-05 MED ORDER — POLYETHYLENE GLYCOL 3350 17 GM/SCOOP PO POWD: ORAL | Status: DC

## 2014-04-05 MED ORDER — ATENOLOL 25 MG PO TABS: ORAL_TABLET | ORAL | Status: DC

## 2014-04-05 MED ORDER — METFORMIN HCL 500 MG PO TABS: ORAL_TABLET | ORAL | Status: DC

## 2014-04-05 MED ORDER — LOSARTAN POTASSIUM 100 MG PO TABS: ORAL_TABLET | ORAL | Status: DC

## 2014-04-05 NOTE — Patient Instructions (Signed)
Today we updated your med list in our EPIC system...    Continue your current medications the same...  Today we did your follow up DM labs and a B12 level...    We will contact you w/ the results when available...   We gave you the PREVNAR-13 pneumonia shot today     According to the current guidelines- this is the last pneumonia vaccine you will need...  Keep up the good work w/ your low carb diet & exercise program...  Call for any questions...  Let's plan a follow up visit in 31mo, sooner if needed for problems.Marland KitchenMarland Kitchen

## 2014-04-06 ENCOUNTER — Encounter: Payer: Self-pay | Admitting: Pulmonary Disease

## 2014-04-06 NOTE — Progress Notes (Signed)
Subjective:    Patient ID: Jesse Santos, male    DOB: 1935-12-05, 78 y.o.   MRN: [id number]  HPI 78 y/o WM w/ mult med problems here for a follow up visit>  ~  SEE PREV EOPIC NOTES FOR OLDER DATA >>   ~  September 15, 2012:  24mo ROV & Jesse Santos is c/o some pain in his neck=> we will Rx w/ Tramadol for prn use;  He also notes that his pulse seems "higher" since he started the Losartan, pulse= 87 reg today, BP=140/84, and we discussed adding Aten25mg /d to his med regimen...  We reviewed the following medical problems during today's office visit>>     HOH> he has hearing aides...    AR> on allergy shots, OTC antihist, saline, Nasonex; gets occas bouts of sinusitis but iproved this past yr...    OSA> Sleep Study was in 204 w/ RDI=45 & desat to 88% w/ mod snoring & leg jerks, but he states he never could use the CPAP effectively & he sleeps fine if he stays on his side; denies daytime sleepiness or other issues...    COPD> exsmoker quit 1981; on AdvairHFA115-2spBid, Mucinex prn; he doesn't like Pred Rx; he had Bronchitic exac treated by TP 12/13 w/ Augmentin, Mucinex, etc & improved; needs incr exercise program...    HBP> on Losar100; BP=140/84, he notes incr HR he says & we decided to add low dose ATEN25mg /d...    CAD> on ASA81; followed by DrHochrein- CT Abd 2010 showed coronary calcif, Treadmill was neg; Myoview 3/13 was neg, wnl...    Hyperlipid> on Niacin500mg ; FLP showed TChol 140, TG 56, HDL 39, LDL 90; continue same, get wt down...    DM> on Metform500Bid; Labs showed BS=134, A1c=7.5; wt=228# (no change) rec to continue same med, better diet, get wt down.[name]    GI- GERD, dysphagia, Divertics, IBS, Polyps> on Protonix40, Miralax, Levsin0.125 prn; followed by DrPerry & stable- last colon 5/12 w/ severe divertics & 2 sm adenomas removed...    GU- Kid stone, BPH> on Flomax0.4 & Cialis prn; PSA remains wnl at 1.10    DJD, LBP> left knee complaints w/ shots from DrDuda, similar for left shoulder; hx bilat  hip AVN noted on prev CT Abd but he has min complaints and uses Tylenol prn...    Anxiety> he does not want anxiolytic meds.. We reviewed prob list, meds, xrays and labs> see below for updates >> he had the 2013 Flu vaccine 9/13... Meds refilled today per request...  LABS 1/14:  FLP- at goals on Niacin rx;  Chems- ok w/ BS=134, A1c=7.5;  CBC- wnl;  TSH=1.13;  VitD=42;  PSA=1.10      ~  December 02, 2012:  2-66mo ROV & add-on for 1wk hx right ear pain, dizzy, gums sore, aching, HA w/ pressure sensation;  Exam shows afeb, ears clear, TMs ok, sl pharyngeal erythema w/o exud, chest w/ scat rhonchi;  We decided to treat w/ Depo80, Medrol dosepak, & ZPak...  He has Tramadol & Tylenol to use for pain... We reviewed prob list, meds, xrays and labs> see below for updates >>   ~  March 23, 2013:  3-44mo ROV & Jesse Santos has several minor complants> He fell w/ abrasion on left knee w/ resultant cellulitis- went to ER & had f/u w/ TP, now resolved after Keflex Rx; c/o prob w/ BPH & obstructive symptoms w/ nocturia & decr flow despite Flomax0.4mg /d- he would like to incr to Bid before considering Urology referral; also c/o  some left hip pain- known AVN from prev evals & usually controlled w/ Tylenol alone; Rec to use the Tramadol + Tylenol; he is established w/ drDuda but hasn't had re-eval in some time & encouraged to f/u w/ Ortho at his convenience...    Breathing is good on AdvairMDI-115-21 at 2spBid + his allergy shots & meds; denies cough, sput, dyspnea, CP, etc...    BP controlled on Aten25, Losar100> BP=128/72 & he denies CP, palpit, SOB, edema, etc...     He saw DrHochrein 4/14> hx CAD, doing satis on ASA & BP rx w/o angina & rec to follow program of aggressive risk factor reduction, no change in meds...    Lipids are controlled on diet + Niacin;  DM is treated w/ Metform500Bid- he deperately needs to diet, incr exercise & get the weight down; Labs 7/14 showed BS=140, A1c=7.3 We reviewed prob list, meds, xrays and  labs> see below for updates >> meds refilled per request today...  LABS 7/14:  Chems- ok x BS=140, A1c=7.3.Marland KitchenMarland Kitchen   ~  October 04, 2013:  80mo ROV & Jesse Santos reports that DrDuda plans a left THR (for avasc necreosis) whenever he is ready, but he's not rushing it, on Tramadol prn & doing satis for now...     Breathing is stable on Advair115 MDI along w/ Mucinex, fluids, nasal saline, plus claritin...     BP controlled on Aten25, Losar100 w/ BP= 130/80 & he denies CP, palpit, SOB, edema...    Chol looks good on diet + Niasp500; FLP 1/15 showed TChol 148, TG 57, HDL 43, LDL 94     He remains on Metform500Bid & low carb diet for his DM- weight = 227# (no change w/ BMI=29-30), BS= 129 & A1c= 7.0, and we discussed diet, exercise, & weight reduction strategies...    GI is also stable on Protonix40, Miralax, & Levsin as needed; he denies abd pain, dysphagia, n/v, c/d, blood seen...     GU is ok w/ Flomax0.4.Marland KitchenMarland Kitchen  We reviewed prob list, meds, xrays and labs> see below for updates >>   LABS 1/15:  FLP- at goals on diet+Niaspan;  Chesm- ok w/ BS=129, A1c=7.0;  CBC- wnl;  TSH=1.06...  ~  April 05, 2014:  18mo ROV & reports a good interval, no new complaints or concerns- notes mild intermit LTOS, wants B12 level checked (on Metformin), wants Prevnar-13 shot... We reviewed the following medical problems during today's office visit >>     HOH> he has bilat hearing aides...    AR> on allergy shots, OTC antihist, Saline, Flonase; gets occas bouts of sinusitis but overall improved, had ZPak & Levaquin in May from TP=> resolved...    OSA> Sleep Study was in 2/04 w/ RDI=45 & desat to 88% w/ mod snoring & leg jerks, but he states he never could use the CPAP effectively & states he sleeps fine if he stays on his side; denies daytime sleepiness or other issues...    COPD> exsmoker quit 1981; on AdvairHFA115-2spBid, Mucinex prn; he doesn't like Pred Rx; he had Bronchitic exac treated by TP 5/15 w/ ZPak, then Levaquin, Mucinex, etc &  improved; needs incr exercise program...    HBP> on Aten25, Losar100; BP=124/70, he denies CP, palpit, dizzy, SOB, edema, etc...    CAD> on ASA81; followed by DrHochrein- CT Abd 2010 showed coronary calcif, Treadmill was neg; Myoview 3/13 was neg, wnl; last seen 4/15- no new symptoms, active w/ yard work, EKG showed wnl, they decided to do Treadmill (borderline  ST depression), then Myoview 6/15 showed NEG- no ischemia, decr uptake inferiorly same as 2013, EF=69%, norm wall motion, low risk...    Hyperlipid> on Niacin500mg ; FLP 1/15 showed TChol 148, TG 57, HDL 43, LDL 94; continue same, get wt down...    DM> on Metform500Bid; wt=224#, BMI=29; Labs 7/15 showed BS=126, A1c=7.1; rec to continue same med, better diet, get wt down!..    GI- GERD, dysphagia, Divertics, IBS, Polyps> on Protonix40, Miralax, Levsin0.125 prn; followed by DrPerry & stable- last colon 5/12 w/ severe divertics & 2 sm adenomas removed...    GU- Kid stone, BPH> on Flomax0.4 & Cialis prn; PSA remains wnl...    DJD, LBP> left knee complaints w/ shots from DrDuda, similar for left shoulder; hx bilat hip AVN noted on prev CT Abd but he has min complaints and uses Tylenol prn (holding off on surg)...    Anxiety> he does not want anxiolytic meds.. We reviewed prob list, meds, xrays and labs> see below for updates >>   EKG 4/15 showed NSR, rate70, wnl, NAD...  Treadmill 6/15 (DrBrackbill) showed borderline ST segment changes w/ exercise & a run of atrial tachy reported...   Myoview 6/15 showed good exerc capacity, norm BP response, no CP, no evid ischemia, decr uptake inferiorly on rest images similar to 2013, EF=69%, norm wall motion...  LABS 7/15:  Chems- ok w/ BS=126, A1c=7.1.Marland KitchenMarland Kitchen rec to continue Metform500Bid & get on diet, incr exerc, get wt down!!!         Problem List:  HEARING LOSS (ICD-389.9) - he has bilat hearing aides...  ALLERGIC RHINITIS (ICD-477.9) - on allergy shots from DrESL Gaetano Hawthorne)... plus Claritin, Saline,  Mucinex, Flonase. ~  occas bouts of sinusitis requiring antibiotic Rx... ~  8/13: he had allergy f/u DrVanWinkle> skin testing 2010 pos to dust mites and molds; doing well on shots once/mo; stable & no changes made... ~  3/14: he presented w/ an upper resp infection & treated w/ Depo, Dosepak, Zithromax... ~  8/14:  He had allergy f/u DrVanWinkle> Advair, Flonase, Saline, Mucinex; he checked FENO- reported normal... ~  7/15: stable on allergy meds from DrVanWinkle, plus AdvairHFA115; he had sinus/ AB exac 5/15 treated by TP w/ ZPak=> Levaquin & resolved...  OBSTRUCTIVE SLEEP APNEA (ICD-327.23) - sleep study 8/04 showed RDI= 45 & desat to 88%... mod snoring and leg jerks without sleep disruption... CPAP perscribed but not using it now- states he can't sleep w/ it on & furthermore he is resting well as long as he stays on his side... denies daytime hypersomnolence & not interested in re-evaluation.  COPD (ICD-496) - ex-smoker, quit 1981... on ADVAIR 100Bid, PROAIR Prn, MUCINEX Bid... he had Pneumovax in 2008... doesn't like Pred Rx...  ~  10/11:  doing well except recent cough, min green sputum (no change in dyspnea)- we will Rx w/ Augmentin, incr Mucinex/ Fluids. ~  4/12:  Breathing at baseline, continue maintenance meds... ~  CXR 12/13 showed normal heart size, clear lungs, NAD... ~  1/14:  He's had 2 exac this past yr- saw TP w/ Rx & improved... ~  1/15:  He continues to do well on Advair + allergy Rx from DrVanWinkle w/ Flonase, Saline, Mucinex...  HYPERTENSION (ICD-401.9) - prev on diet alone> then on Diovan320, but we decided to change to LOSARTAN 100mg /d 5/13 to save $$ ~  He claims that Norvasc & HCTZ "stopped my urine flow"... ~  10/11:  BP= 128/80 & even better at home he says... denies HA, fatigue, visual  changes, CP, palipit, dizziness, syncope, dyspnea, edema, etc... ~  4/12:  Pt has seen TP, DrHochrein, & checking BP daily at home; tried on Norvasc but claims it decr his urine flow;  now on Diovan & improved w/ BP= 132/70... ~  5/12:  BP remains well controlled on Diovan 320mg /d= 120/62 today. ~  11/12:  BP= 142/82 on Diovan160 now; denies CP, palpit, dizzy, SOB, edema... ~  5/13:  BP= 128/66 & he's requesting change to cheaper med; rec switch Diovan to LOSARTAN 100mg /d... ~  1/14: on Losar100; BP=140/84, he notes incr HR he says & we decided to add low dose ATENOLOL25mg /d... ~  7/14: on Aten25, Losar100> BP=128/72 & he denies CP, palpit, SOB, edema, etc... ~  1/15: BP controlled on Aten25, Losar100 w/ BP= 130/80 & he denies CP, palpit, SOB, edema. ~  7/15: on Aten25, Losar100; BP=124/70, he denies CP, palpit, dizzy, SOB, edema, etc.  R/O CAD (ICD-414.00) - on ASA 81mg /d... Followed by DrHochrein & his notes are reviewed. ~  hx neg cardiac cath 1988 by DrJoe [place]... ~  NuclearStressTest 10/02 was norm- no scar or ischemia, EF=55%. ~  CT Abd 4/10 via ER showed coronary calcif... referred to Cards. ~  Eval by DrHochrein 6/10 showed norm EKG & cardiac exam... treadmill ordered. ~  Treadmill 6/10 showed reasonable exerc tolerance, no ischemic changes, few PVCs, sl incr BP... ~  EKG 3/13 showed NSR, rate76, WNL, NAD... ~  Myoview 3/13 was neg> exercised for 11min, stopped for fatigue & CP; no ST seg changes, occas PACs & PVCs; hypertensive BP response; no ischemia, EF=71%, normal wall motion... ~  He saw DrHochrein 4/14> hx CAD, doing satis on ASA & BP rx w/o angina & rec to follow program of aggressive risk factor reduction, no change in meds.  ~  EKG 4/14 showed NSR, rate60, wnl, NAD.. ~  6/15: .on ASA81; followed by DrHochrein- last seen 4/15- no new symptoms, active w/ yard work, EKG showed wnl, they decided to do Treadmill (borderline ST depression), then Myoview 6/15 showed NEG- no ischemia, decr uptake inferiorly same as 2013, EF=69%, norm wall motion, low risk...  MIXED HYPERLIPIDEMIA (ICD-272.2) - low HDL and NIASPAN 500mg /d started 4/09... ~  [place] 4/09 showed TChol  160, TG 106, HDL 26, LDL 113... rec- diet + Niaspan 500/d. ~  FLP 10/09 showed TChol 155, TG 78, HDL 33, LDL 106... ~  FLP 4/10 showed TChol 133, TG 63, HDL 30, LDL 91... rec> continue same. ~  FLP 4/11 showed TChol 144, TG 61, HDL 38, LDL 94 ~  FLP 10/11 showed TChol 155, TG 81, HDL 40, LDL 99 ~  FLP 4/12 showed TChol 142, TG 69, HDL 38, LDL 90 ~  FLP 11/12 showed TChol 165, TG 82, HDL 43, LDL 106 ~  FLP 5/13 in Niasp500 showed TChol 155, TG 72, HDL 46, LDL 95... rec change to generic. ~  FLP 1/14 on Niacin500 showed TChol 140, TG 56, HDL 39, LDL 90 ~  FLP 1/15 on Niaspan500 showed TChol 148, TG 57, HDL 43, LDL 94   DM (ICD-250.00) - on diet + METFORMIN 500mg Bid... he reports BS at home all in the 120-130's... ~  labs 4/09 showed BS= 132, HgA1c= 6.6.Marland KitchenMarland Kitchen rec- same med, better diet... ~  labs 10/09 showed BS= 131, HgA1c= 6.3.Marland KitchenMarland Kitchen ~  labs 4/10 showed BS= 117, HgA1c= 6.2.[name]. rec> same meds/ diet Rx. ~  labs 10/10 showed BS= 138, A1c= 6.5 ~  labs 4/11 showed BS= 121, A1c=  6.5... On Metform500Bid + diet. ~  Dilated eye exam 5/11 by DrShapiro- no retinopathy... ~  labs 10/11 (wt=228#) showed BS= 118, A1c= 7.1.[name]. not as good- get on diet or more meds. ~  Labs 4/12 (wt=229#) showed BS= 119, A1c= 7.3... Wrong direction, may need more meds, get wt down! ~  5/12:  Ophthalmology check by DrShapiro was neg- no retinopathy... ~  Labs 11/12 showed BS= 136, A1c= 7.0 ~  Labs 5/13 on MetformBid showed BS= 132, A1c= 7.1.Marland KitchenMarland Kitchen Continue same, get wt down. ~  Ophthalmology check from DrShapiro 5/13> no DM retinopathy... ~  Labs 1/14 showed BS= 134, A1c= 7.5; not as good- needs better diet, get wt down, same meds for now... ~  Labs 7/14 on Metform500Bid showed BS= 140, A1c= 7.3  ~  Labs 1/154 on Metform500Bid showed BS= 129 & A1c= 7.0, and we reviewed meds/ diet/ exercise/ weight... ~  Labs 7/15 on Metform500Bid showed BS= 126, A1c= 7.1 & reminded of need for diet/ exerc/ wt reduction...  GERD (ICD-530.81) & ?  GASTROPARESIS - on PROTONIX 40mg /d, & off Reglan per DrPerry... ~   last EGD 6/07 by DrPerry showed gastic polyp... ~  8/10:  given Compazine suppos for gastroparesis flair by DrGessner. ~  4/12:  C/o intermittent dysphagia & referred back to GI for further eval (colonoscopy due as well)...  DIVERTICULOSIS OF COLON (ICD-562.10) - he uses MIRALAX Bid regularly... IRRITABLE BOWEL SYNDROME (ICD-564.1) COLONIC POLYPS (ICD-211.3) ~  last colonoscopy 5/07 by DrSam showed divertics only... f/u 43yrs. ~  CT Abd 4/10 in ER showed atx bases, coronary calcif, s/p GB, abdAo calcif, 79mm renal stone, divertics, bilat hip AVN. ~  Follow up colonoscopy by DrPerry 5/12 showed severe diverticulosis, 2 sm polyps= tubular adenoma & f/u planned 5 yrs...  RENAL CALCULUS, HX OF (ICD-V13.01) BENIGN PROSTATIC HYPERTROPHY, HX OF (ICD-V13.8) - on FLOMAX 0.4mg /d w/ improved symptoms (he states flow better since stopping Vit E supplement). ~  4/12:  Routine PSA= 4.9 (it was 1.02 4/11) & we rx w/ Doxy Bid x14d, thewn plan recheck PSA==> 1.30  DEGENERATIVE JOINT DISEASE (ICD-715.90) - pt states "I have a bum knee" and eval by DrDuda w/ viscosupplementation shots... Note: Abd CT w/ bilat hip AVN noted... takes TYLENOL Prn... ~  11/10:  s/p left knee arthroscopy by DrDuda... ~  10/11:  s/p fall w/ left knee injury- s/p cortisone shot by DrDuda; hx shot in left shoulder too. ~  7/14: he is c/o incr pain in left hip area; known hx AVN & needs f/u by Ortho...  BACK PAIN, LUMBAR (ICD-724.2)  ANXIETY (ICD-300.00) - not currently on meds for nerves.  ANEMIA (ICD-285.9) - prev hx of GIB... 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  Health Maintenance: ~  GI: followed by DrPerry> Colonoscopy 5/07 by  DrSam... f/u planned 5 yrs. ~  GU:  See PSAs recorded above... ~  Immunizations: Pneumovax in 2008 @ age 52;  [place] given 7/15; Tetanus- given 4/10;  he gets yearly seasonal Flu vaccine.   Past Surgical History  Procedure Laterality Date  . Cholecystectomy    . Cataract extraction    . Skin cancer excision  3/10  left hand   . Knee surgery  11/10    Outpatient Encounter Prescriptions as of 04/05/2014  Medication Sig  . aspirin 81 MG tablet Take 81 mg by mouth daily.    [name] atenolol (TENORMIN) 25 MG tablet TAKE 1 TABLET EVERY DAY  . Cholecalciferol (VITAMIN D) 2000 UNITS CAPS Take 1 capsule by mouth daily.    . COMPRO 25 MG suppository PLACE 1 SUPPOSITORY (25 MG TOTAL) RECTALLY EVERY 12 (TWELVE) HOURS AS NEEDED FOR NAUSEA.  [name] dextromethorphan-guaiFENesin (MUCINEX DM) 30-600 MG per 12 hr tablet Take 1 tablet by mouth every 12 (twelve) hours.  . fluticasone (FLONASE) 50 MCG/ACT nasal spray Place 2 sprays into both nostrils daily.  . fluticasone-salmeterol (ADVAIR HFA) 115-21 MCG/ACT inhaler Inhale 1 puff into the lungs 2 (two) times daily.  [name] glucose blood (ONE TOUCH ULTRA TEST) test strip Test blood sugar once daily as directed  . hyoscyamine (LEVSIN SL) 0.125 MG SL tablet Place 1 tablet (0.125 mg total) under the tongue every 4 (four) hours as needed. As needed for abdominal cramping  . losartan (COZAAR) 100 MG tablet TAKE 1 TABLET BY MOUTH EVERY DAY  . metFORMIN (GLUCOPHAGE) 500 MG tablet TAKE 1 TABLET TWICE A DAY WITH A MEAL  . Multiple Vitamins-Minerals (MULTIVITAMIN WITH MINERALS) tablet Take 1 tablet by mouth daily.    . niacin (NIASPAN) 500 MG CR tablet Take 1 tablet (500 mg total) by mouth at bedtime.  . nitroGLYCERIN (NITROSTAT) 0.4 MG SL tablet Place 1 tablet (0.4 mg total) under the tongue every 5 (five) minutes as needed for chest pain.  . pantoprazole (PROTONIX) 40 MG tablet Take 1 tablet (40 mg total) by mouth daily.  . polyethylene glycol powder (GLYCOLAX/MIRALAX) powder  TAKE 17 GRAMS BY MOUTH TWICE A DAY AS DIRECTED  . sodium chloride (OCEAN) 0.65 % nasal spray 1 spray by Nasal route as needed.    . tamsulosin (FLOMAX) 0.4 MG CAPS capsule TAKE ONE CAPSULE TWICE A DAY  . traMADol (ULTRAM) 50 MG tablet TAKE 1 TABLET THREE TIMES A DAY AS NEEDED FOR PAIN  . [DISCONTINUED] atenolol (TENORMIN) 25 MG tablet TAKE 1 TABLET EVERY DAY  . [DISCONTINUED] losartan (COZAAR) 100 MG tablet TAKE 1 TABLET BY MOUTH EVERY DAY  . [DISCONTINUED] metFORMIN (GLUCOPHAGE) 500 MG tablet TAKE 1 TABLET TWICE A DAY WITH A MEAL  . [DISCONTINUED] polyethylene glycol powder (GLYCOLAX/MIRALAX) powder TAKE 17 GRAMS BY MOUTH TWICE A DAY AS DIRECTED  . [DISCONTINUED] tamsulosin (FLOMAX) 0.4 MG CAPS capsule TAKE ONE CAPSULE TWICE A DAY  . [DISCONTINUED] Alum & Mag Hydroxide-Simeth (MAGIC MOUTHWASH) SOLN 1 tsp gargle and swallow 4 times daily as needed  . [DISCONTINUED] levofloxacin (LEVAQUIN) 500 MG tablet Take 1 tablet (500 mg total) by mouth daily.    Allergies  Allergen Reactions  . Prednisone     REACTION: high dose intolerance    Current Medications, Allergies, Past Medical History, Past Surgical History, Family History, and Social History were reviewed in [employer] record.    Review of Systems         See HPI - all other systems neg except as noted... The patient complains of decreased hearing, dyspnea on exertion, headaches, and difficulty walking.  The patient denies anorexia, fever, weight loss, weight gain, vision loss, hoarseness, chest pain, syncope, peripheral edema, prolonged cough, hemoptysis, abdominal pain, melena, hematochezia, severe indigestion/heartburn, hematuria, incontinence, muscle weakness, suspicious skin lesions, transient blindness, depression, unusual weight change, abnormal bleeding, enlarged lymph nodes, and angioedema.  Objective:   Physical Exam      WD, WN, 78 y/o WM in NAD... GENERAL:  Alert & oriented; pleasant &  cooperative... HEENT:  /AT, EOM-full, PERRLA, EACs-clear, TMs-wnl, NOSE- pale, congested, THROAT-clear & wnl. NECK:  Supple w/ fairROM; no JVD; normal carotid impulses w/o bruits; no thyromegaly or nodules palpated; no lymphadenopathy. CHEST:  Clear to P & A; without wheezes/ rales/ or rhonchi heard... HEART:  Regular Rhythm; without murmurs/ rubs/ or gallops detected...  ABDOMEN:  Soft & nontender; normal bowel sounds; no organomegaly or masses palpated., no guarding, or rebound EXT: without deformities, mild arthritic changes; no varicose veins/ venous insuffic/ or edema. NEURO:  CN's intact;  no focal neuro deficits... DERM:  No lesions noted; no rash etc...  RADIOLOGY DATA:  Reviewed in the EPIC EMR & discussed w/ the patient...  LABORATORY DATA:  Reviewed in the EPIC EMR & discussed w/ the patient...   Assessment & Plan:    COPD>  He continues on Advair, Proair rescue, Mucinex; also Claritin, Flonase, Saline; stable continue same Rx... ~  7/15: given Prevnar-13  HBP>  On LOSARTAN 100mg /d & POEU23 w/ good control of BP, continue same...  R/O CAD>  Followed by DrHochrein, and doing satis w/ neg Myoview 6/15... continue ASA & risk factor reduction....  Lipids>  Looks satis on his diet + Niacin...  DM>  Control is fair w/ A1c stable at 7.1; he was told there are more meds in his future if he doesn't get his wt down (currently~230#)...  GI>  Per DrPerry w/ GERD, ?Gastroparesis, Divertics, IBS, Colon polyps> f/u colon 5/12 w/ divertics & 2 sm adenomas, repeat planned ~41yrs...  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  Anxiety>  Certainly an issue, he does not want anxiolytic Rx...   Patient's Medications  New Prescriptions   No medications on file  Previous Medications   ASPIRIN 81 MG TABLET    Take 81 mg by mouth daily.     CHOLECALCIFEROL (VITAMIN D) 2000 UNITS CAPS    Take 1 capsule by mouth daily.     COMPRO 25 MG  SUPPOSITORY    PLACE 1 SUPPOSITORY (25 MG TOTAL) RECTALLY EVERY 12 (TWELVE) HOURS AS NEEDED FOR NAUSEA.   DEXTROMETHORPHAN-GUAIFENESIN (MUCINEX DM) 30-600 MG PER 12 HR TABLET    Take 1 tablet by mouth every 12 (twelve) hours.   FLUTICASONE (FLONASE) 50 MCG/ACT NASAL SPRAY    Place 2 sprays into both nostrils daily.   FLUTICASONE-SALMETEROL (ADVAIR HFA) 115-21 MCG/ACT INHALER    Inhale 1 puff into the lungs 2 (two) times daily.   GLUCOSE BLOOD (ONE TOUCH ULTRA TEST) TEST STRIP    Test blood sugar once daily as directed   HYOSCYAMINE (LEVSIN SL) 0.125 MG SL TABLET    Place 1 tablet (0.125 mg total) under the tongue every 4 (four) hours as needed. As needed for abdominal cramping   MULTIPLE VITAMINS-MINERALS (MULTIVITAMIN WITH MINERALS) TABLET    Take 1 tablet by mouth daily.     NIACIN (NIASPAN) 500 MG CR TABLET    Take 1 tablet (500 mg total) by mouth at bedtime.   NITROGLYCERIN (NITROSTAT) 0.4 MG SL TABLET    Place 1 tablet (0.4 mg total) under the tongue every 5 (five) minutes as needed for chest pain.   PANTOPRAZOLE (PROTONIX) 40 MG TABLET    Take 1 tablet (40 mg total) by mouth daily.   SODIUM CHLORIDE (OCEAN)  0.65 % NASAL SPRAY    1 spray by Nasal route as needed.     TRAMADOL (ULTRAM) 50 MG TABLET    TAKE 1 TABLET THREE TIMES A DAY AS NEEDED FOR PAIN  Modified Medications   Modified Medication Previous Medication   ATENOLOL (TENORMIN) 25 MG TABLET atenolol (TENORMIN) 25 MG tablet      TAKE 1 TABLET EVERY DAY    TAKE 1 TABLET EVERY DAY   LOSARTAN (COZAAR) 100 MG TABLET losartan (COZAAR) 100 MG tablet      TAKE 1 TABLET BY MOUTH EVERY DAY    TAKE 1 TABLET BY MOUTH EVERY DAY   METFORMIN (GLUCOPHAGE) 500 MG TABLET metFORMIN (GLUCOPHAGE) 500 MG tablet      TAKE 1 TABLET TWICE A DAY WITH A MEAL    TAKE 1 TABLET TWICE A DAY WITH A MEAL   POLYETHYLENE GLYCOL POWDER (GLYCOLAX/MIRALAX) POWDER polyethylene glycol powder (GLYCOLAX/MIRALAX) powder      TAKE 17 GRAMS BY MOUTH TWICE A DAY AS DIRECTED     TAKE 17 GRAMS BY MOUTH TWICE A DAY AS DIRECTED   TAMSULOSIN (FLOMAX) 0.4 MG CAPS CAPSULE tamsulosin (FLOMAX) 0.4 MG CAPS capsule      TAKE ONE CAPSULE TWICE A DAY    TAKE ONE CAPSULE TWICE A DAY  Discontinued Medications   ALUM & MAG HYDROXIDE-SIMETH (MAGIC MOUTHWASH) SOLN    1 tsp gargle and swallow 4 times daily as needed   LEVOFLOXACIN (LEVAQUIN) 500 MG TABLET    Take 1 tablet (500 mg total) by mouth daily.

## 2014-05-11 DIAGNOSIS — J45909 Unspecified asthma, uncomplicated: Secondary | ICD-10-CM | POA: Diagnosis not present

## 2014-05-11 DIAGNOSIS — J3089 Other allergic rhinitis: Secondary | ICD-10-CM | POA: Diagnosis not present

## 2014-06-11 ENCOUNTER — Encounter: Payer: Self-pay | Admitting: Adult Health

## 2014-06-11 ENCOUNTER — Ambulatory Visit (INDEPENDENT_AMBULATORY_CARE_PROVIDER_SITE_OTHER): Payer: Medicare Other | Admitting: Adult Health

## 2014-06-11 ENCOUNTER — Ambulatory Visit: Payer: Medicare Other

## 2014-06-11 ENCOUNTER — Telehealth: Payer: Self-pay | Admitting: Pulmonary Disease

## 2014-06-11 VITALS — BP 136/64 | HR 79 | Temp 99.0°F | Ht 74.0 in | Wt 225.2 lb

## 2014-06-11 DIAGNOSIS — J449 Chronic obstructive pulmonary disease, unspecified: Secondary | ICD-10-CM | POA: Diagnosis not present

## 2014-06-11 DIAGNOSIS — I2510 Atherosclerotic heart disease of native coronary artery without angina pectoris: Secondary | ICD-10-CM | POA: Diagnosis not present

## 2014-06-11 MED ORDER — AZITHROMYCIN 250 MG PO TABS: ORAL_TABLET | ORAL | Status: AC

## 2014-06-11 MED ORDER — HYDROCODONE-HOMATROPINE 5-1.5 MG/5ML PO SYRP: 2.5000 mL | ORAL_SOLUTION | Freq: Every evening | ORAL | Status: DC | PRN

## 2014-06-11 NOTE — Assessment & Plan Note (Signed)
Mild flare with URI   Plan  Zpack -to have on hold if symptoms worsen with discolored mucus  Mucinex DM Twice daily  As needed  Cough/congestion  Saline nasal rinses As needed   Claritin daily As needed  Drainage  Hydromet 1/2 tsp At bedtime  As needed  Cough, may make you sleepy .  Please contact office for sooner follow up if symptoms do not improve or worsen or seek emergency care

## 2014-06-11 NOTE — Progress Notes (Signed)
Subjective:    Patient ID: Jesse Santos, male    DOB: [birth year], 78 y.o.   MRN: [id number]  HPI 78 y/o WM    06/11/2014 Acute OV  Complains of  head congestion, HA, clear nasal drainage, PND w/ sore throat, body aches for 4 days .  Denies f/c/s, n/v/d, hemoptysis, discolored mucus.  Taking mucinex and claritin with some help.  Cough is aggravating. And keeping him up at night.  No recent travel or abx use.  Patient denies any chest pain, orthopnea, PND, or leg swelling.          Problem List:  HEARING LOSS (ICD-389.9) - he has bilat hearing aides...  ALLERGIC RHINITIS (ICD-477.9) - on allergy shots from DrESL Gaetano Hawthorne)... plus Claritin, Saline, Mucinex, Flonase. ~  occas bouts of sinusitis requiring antibiotic Rx... ~  8/13: he had allergy f/u DrVanWinkle> skin testing 2010 pos to dust mites and molds; doing well on shots once/mo; stable & no changes made... ~  3/14: he presented w/ an upper resp infection & treated w/ Depo, Dosepak, Zithromax...  OBSTRUCTIVE SLEEP APNEA (ICD-327.23) - sleep study 8/04 showed RDI= 45 & desat to 88%... mod snoring and leg jerks without sleep disruption... CPAP perscribed but not using it now- states he can't sleep w/ it on & furthermore he is resting well as long as he stays on his side... denies daytime hypersomnolence & not interested in re-evaluation.  COPD (ICD-496) - ex-smoker, quit 1981... on ADVAIR 100Bid, PROAIR Prn, MUCINEX Bid... he had Pneumovax in 2008... doesn't like Pred Rx...  ~  10/11:  doing well except recent cough, min green sputum (no change in dyspnea)- we will Rx w/ Augmentin, incr Mucinex/ Fluids. ~  4/12:  Breathing at baseline, continue maintenance meds... ~  CXR 12/13 showed normal heart size, clear lungs, NAD... ~  1/14:  He's had 2 exac this past yr- saw TP w/ Rx & improved...  HYPERTENSION (ICD-401.9) - prev on diet alone> then on Diovan320, but we decided to change to LOSARTAN 100mg /d 5/13 to save $$ ~  He claims  that Norvasc & HCTZ "stopped my urine flow"... ~  10/11:  BP= 128/80 & even better at home he says... denies HA, fatigue, visual changes, CP, palipit, dizziness, syncope, dyspnea, edema, etc... ~  4/12:  Pt has seen TP, DrHochrein, & checking BP daily at home; tried on Norvasc but claims it decr his urine flow; now on Diovan & improved w/ BP= 132/70... ~  5/12:  BP remains well controlled on Diovan 320mg /d= 120/62 today. ~  11/12:  BP= 142/82 on Diovan160 now; denies CP, palpit, dizzy, SOB, edema... ~  5/13:  BP= 128/66 & he's requesting change to cheaper med; rec switch Diovan to LOSARTAN 100mg /d... ~  1/14: on Losar100; BP=140/84, he notes incr HR he says & we decided to add low dose ATENOLOL25mg /d... ~  7/14: on Aten25, Losar100> BP=128/72 & he denies CP, palpit, SOB, edema, etc...  R/O CAD (ICD-414.00) - on ASA 81mg /d... Followed by DrHochrein & his notes are reviewed. ~  hx neg cardiac cath 1988 by DrJoe [place]... ~  NuclearStressTest 10/02 was norm- no scar or ischemia, EF=55%. ~  CT Abd 4/10 via ER showed coronary calcif... referred to Cards. ~  Eval by DrHochrein 6/10 showed norm EKG & cardiac exam... treadmill ordered. ~  Treadmill 6/10 showed reasonable exerc tolerance, no ischemic changes, few PVCs, sl incr BP... ~  EKG 3/13 showed NSR, rate76, WNL, NAD... ~  Myoview 3/13 was  neg> exercised for 9min, stopped for fatigue & CP; no ST seg changes, occas PACs & PVCs; hypertensive BP response; no ischemia, EF=71%, normal wall motion... ~  He saw DrHochrein 4/14> hx CAD, doing satis on ASA & BP rx w/o angina & rec to follow program of aggressive risk factor reduction, no change in meds.   MIXED HYPERLIPIDEMIA (ICD-272.2) - low HDL and NIASPAN 500mg /d started 4/09... ~  [place] 4/09 showed TChol 160, TG 106, HDL 26, LDL 113... rec- diet + Niaspan 500/d. ~  FLP 10/09 showed TChol 155, TG 78, HDL 33, LDL 106... ~  FLP 4/10 showed TChol 133, TG 63, HDL 30, LDL 91... rec> continue same. ~  FLP  4/11 showed TChol 144, TG 61, HDL 38, LDL 94 ~  FLP 10/11 showed TChol 155, TG 81, HDL 40, LDL 99 ~  FLP 4/12 showed TChol 142, TG 69, HDL 38, LDL 90 ~  FLP 11/12 showed TChol 165, TG 82, HDL 43, LDL 106 ~  FLP 5/13 in Niasp500 showed TChol 155, TG 72, HDL 46, LDL 95... rec change to generic. ~  FLP 1/14 on Niacin500 showed TChol 140, TG 56, HDL 39, LDL 90  DM (ICD-250.00) - on diet + METFORMIN 500mg Bid... he reports BS at home all in the 120-130's... ~  labs 4/09 showed BS= 132, HgA1c= 6.6.Marland KitchenMarland Kitchen rec- same med, better diet... ~  labs 10/09 showed BS= 131, HgA1c= 6.3.Marland KitchenMarland Kitchen ~  labs 4/10 showed BS= 117, HgA1c= 6.2.[name]. rec> same meds/ diet Rx. ~  labs 10/10 showed BS= 138, A1c= 6.5 ~  labs 4/11 showed BS= 121, A1c= 6.5.Marland KitchenMarland Kitchen On Metform500Bid + diet. ~  Dilated eye exam 5/11 by DrShapiro- no retinopathy... ~  labs 10/11 (wt=228#) showed BS= 118, A1c= 7.1.[name]. not as good- get on diet or more meds. ~  Labs 4/12 (wt=229#) showed BS= 119, A1c= 7.3... Wrong direction, may need more meds, get wt down! ~  5/12:  Ophthalmology check by DrShapiro was neg- no retinopathy... ~  Labs 11/12 showed BS= 136, A1c= 7.0 ~  Labs 5/13 on MetformBid showed BS= 132, A1c= 7.1.Marland KitchenMarland Kitchen Continue same, get wt down. ~  Ophthalmology check from DrShapiro 5/13> no DM retinopathy... ~  Labs 1/14 showed BS= 134, A1c= 7.5; not as good- needs better diet, get wt down, same meds for now... ~  Labs 7/14 on Metform500Bid showed BS= 140, A1c= 7.3   GERD (ICD-530.81) & ? GASTROPARESIS - on PROTONIX 40mg /d, & off Reglan per DrPerry... ~   last EGD 6/07 by DrPerry showed gastic polyp... ~  8/10:  given Compazine suppos for gastroparesis flair by DrGessner. ~  4/12:  C/o intermittent dysphagia & referred back to GI for further eval (colonoscopy due as well)...  DIVERTICULOSIS OF COLON (ICD-562.10) - he uses MIRALAX Bid regularly... IRRITABLE BOWEL SYNDROME (ICD-564.1) COLONIC POLYPS (ICD-211.3) ~  last colonoscopy 5/07 by DrSam showed divertics  only... f/u 27yrs. ~  CT Abd 4/10 in ER showed atx bases, coronary calcif, s/p GB, abdAo calcif, 67mm renal stone, divertics, bilat hip AVN. ~  Follow up colonoscopy by DrPerry 5/12 showed severe diverticulosis, 2 sm polyps= tubular adenoma & f/u planned 5 yrs...  RENAL CALCULUS, HX OF (ICD-V13.01) BENIGN PROSTATIC HYPERTROPHY, HX OF (ICD-V13.8) - on FLOMAX 0.4mg /d w/ improved symptoms (he states flow better since stopping Vit E supplement). ~  4/12:  Routine PSA= 4.9 (it was 1.02 4/11) & we rx w/ Doxy Bid x14d, thewn plan recheck PSA==> 1.30  DEGENERATIVE JOINT DISEASE (ICD-715.90) - pt  states "I have a bum knee" and eval by DrDuda w/ viscosupplementation shots... Note: Abd CT w/ bilat hip AVN noted... takes TYLENOL Prn... ~  11/10:  s/p left knee arthroscopy by DrDuda... ~  10/11:  s/p fall w/ left knee injury- s/p cortisone shot by DrDuda; hx shot in left shoulder too. ~  7/14: he is c/o incr pain in left hip area; known hx AVN & needs f/u by Ortho...  BACK PAIN, LUMBAR (ICD-724.2)  ANXIETY (ICD-300.00) - not currently on meds for nerves.  ANEMIA (ICD-285.9) - prev hx of GIB... 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  Health Maintenance: ~  GI: followed by DrPerry> Colonoscopy 5/07 by DrSam... f/u planned 5 yrs. ~  GU:  See PSAs recorded above... ~  Immunizations: Pneumovax in 2008 @ age 36;  Tetanus- given 4/10;  he gets yearly seasonal Flu vaccine.   Past Surgical History  Procedure Laterality Date  . Cholecystectomy    . Cataract extraction    . Skin cancer excision  3/10    left hand   . Knee surgery  11/10    Outpatient Encounter Prescriptions as of 06/11/2014  Medication Sig  . aspirin 81 MG tablet Take 81 mg by mouth daily.    [name] atenolol (TENORMIN) 25 MG tablet TAKE 1 TABLET  EVERY DAY  . Cholecalciferol (VITAMIN D) 2000 UNITS CAPS Take 1 capsule by mouth daily.    . COMPRO 25 MG suppository PLACE 1 SUPPOSITORY (25 MG TOTAL) RECTALLY EVERY 12 (TWELVE) HOURS AS NEEDED FOR NAUSEA.  [name] dextromethorphan-guaiFENesin (MUCINEX DM) 30-600 MG per 12 hr tablet Take 1 tablet by mouth every 12 (twelve) hours.  . fluticasone (FLONASE) 50 MCG/ACT nasal spray Place 2 sprays into both nostrils daily.  . fluticasone-salmeterol (ADVAIR HFA) 115-21 MCG/ACT inhaler Inhale 1 puff into the lungs 2 (two) times daily.  [name] glucose blood (ONE TOUCH ULTRA TEST) test strip Test blood sugar once daily as directed  . hyoscyamine (LEVSIN SL) 0.125 MG SL tablet Place 1 tablet (0.125 mg total) under the tongue every 4 (four) hours as needed. As needed for abdominal cramping  . loratadine (CLARITIN) 10 MG tablet Take 10 mg by mouth daily as needed for allergies.  [name] losartan (COZAAR) 100 MG tablet TAKE 1 TABLET BY MOUTH EVERY DAY  . metFORMIN (GLUCOPHAGE) 500 MG tablet TAKE 1 TABLET TWICE A DAY WITH A MEAL  . Multiple Vitamins-Minerals (MULTIVITAMIN WITH MINERALS) tablet Take 1 tablet by mouth daily.    . niacin (NIASPAN) 500 MG CR tablet Take 1 tablet (500 mg total) by mouth at bedtime.  . nitroGLYCERIN (NITROSTAT) 0.4 MG SL tablet Place 1 tablet (0.4 mg total) under the tongue every 5 (five) minutes as needed for chest pain.  . pantoprazole (PROTONIX) 40 MG tablet Take 1 tablet (40 mg total) by mouth daily.  . polyethylene glycol powder (GLYCOLAX/MIRALAX) powder TAKE 17 GRAMS BY MOUTH TWICE A DAY AS DIRECTED  . sodium chloride (OCEAN) 0.65 % nasal spray 1 spray by Nasal route as needed.    . tamsulosin (FLOMAX) 0.4 MG CAPS capsule TAKE ONE CAPSULE TWICE A DAY  . traMADol (ULTRAM) 50 MG tablet TAKE 1 TABLET THREE TIMES A DAY AS NEEDED FOR PAIN  Allergies  Allergen Reactions  . Prednisone     REACTION: high dose intolerance    Current Medications, Allergies, Past Medical History, Past Surgical  History, Family History, and Social History were reviewed in [employer] record.    Review of Systems         See HPI - all other systems neg except as noted...  he patient denies anorexia, fever, weight loss, weight gain, vision loss, hoarseness, chest pain, syncope, peripheral edema, prolonged cough, hemoptysis, abdominal pain, melena, hematochezia, severe indigestion/heartburn, hematuria, incontinence, muscle weakness, suspicious skin lesions, transient blindness, depression, unusual weight change, abnormal bleeding, enlarged lymph nodes, and angioedema.     Objective:   Physical Exam      WD, WN, 78 y/o WM in NAD... GENERAL:  Alert & oriented; pleasant & cooperative... HEENT:  [place]/AT,   EACs-clear, TMs-wnl, NOSE- pale, congested, THROAT-clear & wnl., non tender sinus  NECK:  Supple w/ fairROM; no JVD; normal carotid impulses w/o bruits; no thyromegaly or nodules palpated; no lymphadenopathy. CHEST:  Clear to P & A; without wheezes/ rales/ or rhonchi heard... HEART:  Regular Rhythm; without murmurs/ rubs/ or gallops detected...  ABDOMEN:  Soft & nontender; normal bowel sounds; no organomegaly or masses palpated., no guarding, or rebound EXT: without deformities, mild arthritic changes; no varicose veins/ venous insuffic/ or edema.

## 2014-06-11 NOTE — Telephone Encounter (Signed)
Called pt. appt scheduled to see TP today at 2:30

## 2014-06-11 NOTE — Patient Instructions (Signed)
Zpack -to have on hold if symptoms worsen with discolored mucus  Mucinex DM Twice daily  As needed  Cough/congestion  Saline nasal rinses As needed   Claritin daily As needed  Drainage  Hydromet 1/2 tsp At bedtime  As needed  Cough, may make you sleepy .  Please contact office for sooner follow up if symptoms do not improve or worsen or seek emergency care

## 2014-06-26 ENCOUNTER — Encounter: Payer: Self-pay | Admitting: Pulmonary Disease

## 2014-06-26 ENCOUNTER — Ambulatory Visit (INDEPENDENT_AMBULATORY_CARE_PROVIDER_SITE_OTHER): Payer: Medicare Other | Admitting: Pulmonary Disease

## 2014-06-26 VITALS — BP 140/60 | HR 74 | Temp 98.6°F | Ht 74.0 in | Wt 225.8 lb

## 2014-06-26 DIAGNOSIS — J209 Acute bronchitis, unspecified: Secondary | ICD-10-CM

## 2014-06-26 DIAGNOSIS — I2510 Atherosclerotic heart disease of native coronary artery without angina pectoris: Secondary | ICD-10-CM

## 2014-06-26 DIAGNOSIS — E119 Type 2 diabetes mellitus without complications: Secondary | ICD-10-CM

## 2014-06-26 DIAGNOSIS — I10 Essential (primary) hypertension: Secondary | ICD-10-CM | POA: Diagnosis not present

## 2014-06-26 DIAGNOSIS — M150 Primary generalized (osteo)arthritis: Secondary | ICD-10-CM

## 2014-06-26 DIAGNOSIS — F411 Generalized anxiety disorder: Secondary | ICD-10-CM

## 2014-06-26 DIAGNOSIS — K3184 Gastroparesis: Secondary | ICD-10-CM

## 2014-06-26 DIAGNOSIS — M159 Polyosteoarthritis, unspecified: Secondary | ICD-10-CM

## 2014-06-26 DIAGNOSIS — K5730 Diverticulosis of large intestine without perforation or abscess without bleeding: Secondary | ICD-10-CM

## 2014-06-26 DIAGNOSIS — J441 Chronic obstructive pulmonary disease with (acute) exacerbation: Secondary | ICD-10-CM | POA: Diagnosis not present

## 2014-06-26 DIAGNOSIS — E782 Mixed hyperlipidemia: Secondary | ICD-10-CM

## 2014-06-26 MED ORDER — FLUTICASONE PROPIONATE 50 MCG/ACT NA SUSP: 2.0000 | Freq: Every day | NASAL | Status: DC

## 2014-06-26 MED ORDER — METHYLPREDNISOLONE (PAK) 4 MG PO TABS: ORAL_TABLET | ORAL | Status: DC

## 2014-06-26 MED ORDER — METHYLPREDNISOLONE ACETATE 80 MG/ML IJ SUSP
80.0000 mg | Freq: Once | INTRAMUSCULAR | Status: AC
  Administered 2014-06-26: 80 mg via INTRAMUSCULAR

## 2014-06-26 MED ORDER — LEVOFLOXACIN 500 MG PO TABS: 500.0000 mg | ORAL_TABLET | Freq: Every day | ORAL | Status: DC

## 2014-06-26 NOTE — Patient Instructions (Signed)
Today we updated your med list in our EPIC system...    Continue your current medications the same...  We decided to treat your refractory Bronchitic episode w/ LEVAQUIN 500mg  take one tab daily...    And a Medrol Dosepak> take as directed on the pack...  Continue the Advair, Mucinex, fluids, and cough syrup as needed...  Continue the ALIGN probiotic as well...  Call for any questions...  [name]

## 2014-06-26 NOTE — Progress Notes (Signed)
Subjective:    Patient ID: Jesse Santos, male    DOB: Feb 28, 1936, 78 y.o.   MRN: [id number]  HPI 78 y/o WM w/ mult med problems here for a follow up visit>  ~  SEE PREV EOPIC NOTES FOR OLDER DATA >>   ~  September 15, 2012:  44mo ROV & Jesse Santos is c/o some pain in his neck=> we will Rx w/ Tramadol for prn use;  He also notes that his pulse seems "higher" since he started the Losartan, pulse= 87 reg today, BP=140/84, and we discussed adding Aten25mg /d to his med regimen...  We reviewed the following medical problems during today's office visit>>     HOH> he has hearing aides...    AR> on allergy shots, OTC antihist, saline, Nasonex; gets occas bouts of sinusitis but iproved this past yr...    OSA> Sleep Study was in 204 w/ RDI=45 & desat to 88% w/ mod snoring & leg jerks, but he states he never could use the CPAP effectively & he sleeps fine if he stays on his side; denies daytime sleepiness or other issues...    COPD> exsmoker quit 1981; on AdvairHFA115-2spBid, Mucinex prn; he doesn't like Pred Rx; he had Bronchitic exac treated by TP 12/13 w/ Augmentin, Mucinex, etc & improved; needs incr exercise program...    HBP> on Losar100; BP=140/84, he notes incr HR he says & we decided to add low dose ATEN25mg /d...    CAD> on ASA81; followed by DrHochrein- CT Abd 2010 showed coronary calcif, Treadmill was neg; Myoview 3/13 was neg, wnl...    Hyperlipid> on Niacin500mg ; FLP showed TChol 140, TG 56, HDL 39, LDL 90; continue same, get wt down...    DM> on Metform500Bid; Labs showed BS=134, A1c=7.5; wt=228# (no change) rec to continue same med, better diet, get wt down.[name]    GI- GERD, dysphagia, Divertics, IBS, Polyps> on Protonix40, Miralax, Levsin0.125 prn; followed by DrPerry & stable- last colon 5/12 w/ severe divertics & 2 sm adenomas removed...    GU- Kid stone, BPH> on Flomax0.4 & Cialis prn; PSA remains wnl at 1.10    DJD, LBP> left knee complaints w/ shots from DrDuda, similar for left shoulder; hx bilat  hip AVN noted on prev CT Abd but he has min complaints and uses Tylenol prn...    Anxiety> he does not want anxiolytic meds.. We reviewed prob list, meds, xrays and labs> see below for updates >> he had the 2013 Flu vaccine 9/13... Meds refilled today per request...  LABS 1/14:  FLP- at goals on Niacin rx;  Chems- ok w/ BS=134, A1c=7.5;  CBC- wnl;  TSH=1.13;  VitD=42;  PSA=1.10      ~  December 02, 2012:  2-49mo ROV & add-on for 1wk hx right ear pain, dizzy, gums sore, aching, HA w/ pressure sensation;  Exam shows afeb, ears clear, TMs ok, sl pharyngeal erythema w/o exud, chest w/ scat rhonchi;  We decided to treat w/ Depo80, Medrol dosepak, & ZPak...  He has Tramadol & Tylenol to use for pain... We reviewed prob list, meds, xrays and labs> see below for updates >>   ~  March 23, 2013:  3-81mo ROV & [name] has several minor complants> He fell w/ abrasion on left knee w/ resultant cellulitis- went to ER & had f/u w/ TP, now resolved after Keflex Rx; c/o prob w/ BPH & obstructive symptoms w/ nocturia & decr flow despite Flomax0.4mg /d- he would like to incr to Bid before considering Urology referral; also c/o  some left hip pain- known AVN from prev evals & usually controlled w/ Tylenol alone; Rec to use the Tramadol + Tylenol; he is established w/ drDuda but hasn't had re-eval in some time & encouraged to f/u w/ Ortho at his convenience...    Breathing is good on AdvairMDI-115-21 at 2spBid + his allergy shots & meds; denies cough, sput, dyspnea, CP, etc...    BP controlled on Aten25, Losar100> BP=128/72 & he denies CP, palpit, SOB, edema, etc...     He saw DrHochrein 4/14> hx CAD, doing satis on ASA & BP rx w/o angina & rec to follow program of aggressive risk factor reduction, no change in meds...    Lipids are controlled on diet + Niacin;  DM is treated w/ Metform500Bid- he deperately needs to diet, incr exercise & get the weight down; Labs 7/14 showed BS=140, A1c=7.3 We reviewed prob list, meds, xrays and  labs> see below for updates >> meds refilled per request today...  LABS 7/14:  Chems- ok x BS=140, A1c=7.3.Marland KitchenMarland Kitchen   ~  October 04, 2013:  80mo ROV & Jesse Santos reports that DrDuda plans a left THR (for avasc necreosis) whenever he is ready, but he's not rushing it, on Tramadol prn & doing satis for now...     Breathing is stable on Advair115 MDI along w/ Mucinex, fluids, nasal saline, plus claritin...     BP controlled on Aten25, Losar100 w/ BP= 130/80 & he denies CP, palpit, SOB, edema...    Chol looks good on diet + Niasp500; FLP 1/15 showed TChol 148, TG 57, HDL 43, LDL 94     He remains on Metform500Bid & low carb diet for his DM- weight = 227# (no change w/ BMI=29-30), BS= 129 & A1c= 7.0, and we discussed diet, exercise, & weight reduction strategies...    GI is also stable on Protonix40, Miralax, & Levsin as needed; he denies abd pain, dysphagia, n/v, c/d, blood seen...     GU is ok w/ Flomax0.4.Marland KitchenMarland Kitchen  We reviewed prob list, meds, xrays and labs> see below for updates >>   LABS 1/15:  FLP- at goals on diet+Niaspan;  Chesm- ok w/ BS=129, A1c=7.0;  CBC- wnl;  TSH=1.06...  ~  April 05, 2014:  18mo ROV & reports a good interval, no new complaints or concerns- notes mild intermit LTOS, wants B12 level checked (on Metformin), wants Prevnar-13 shot... We reviewed the following medical problems during today's office visit >>     HOH> he has bilat hearing aides...    AR> on allergy shots, OTC antihist, Saline, Flonase; gets occas bouts of sinusitis but overall improved, had ZPak & Levaquin in May from TP=> resolved...    OSA> Sleep Study was in 2/04 w/ RDI=45 & desat to 88% w/ mod snoring & leg jerks, but he states he never could use the CPAP effectively & states he sleeps fine if he stays on his side; denies daytime sleepiness or other issues...    COPD> exsmoker quit 1981; on AdvairHFA115-2spBid, Mucinex prn; he doesn't like Pred Rx; he had Bronchitic exac treated by TP 5/15 w/ ZPak, then Levaquin, Mucinex, etc &  improved; needs incr exercise program...    HBP> on Aten25, Losar100; BP=124/70, he denies CP, palpit, dizzy, SOB, edema, etc...    CAD> on ASA81; followed by DrHochrein- CT Abd 2010 showed coronary calcif, Treadmill was neg; Myoview 3/13 was neg, wnl; last seen 4/15- no new symptoms, active w/ yard work, EKG showed wnl, they decided to do Treadmill (borderline  ST depression), then Myoview 6/15 showed NEG- no ischemia, decr uptake inferiorly same as 2013, EF=69%, norm wall motion, low risk...    Hyperlipid> on Niacin500mg ; FLP 1/15 showed TChol 148, TG 57, HDL 43, LDL 94; continue same, get wt down...    DM> on Metform500Bid; wt=224#, BMI=29; Labs 7/15 showed BS=126, A1c=7.1; rec to continue same med, better diet, get wt down!..    GI- GERD, dysphagia, Divertics, IBS, Polyps> on Protonix40, Miralax, Levsin0.125 prn; followed by DrPerry & stable- last colon 5/12 w/ severe divertics & 2 sm adenomas removed...    GU- Kid stone, BPH> on Flomax0.4 & Cialis prn; PSA remains wnl...    DJD, LBP> left knee complaints w/ shots from DrDuda, similar for left shoulder; hx bilat hip AVN noted on prev CT Abd but he has min complaints and uses Tylenol prn (holding off on surg)...    Anxiety> he does not want anxiolytic meds.. We reviewed prob list, meds, xrays and labs> see below for updates >>   EKG 4/15 showed NSR, rate70, wnl, NAD...  Treadmill 6/15 (DrBrackbill) showed borderline ST segment changes w/ exercise & a run of atrial tachy reported...   Myoview 6/15 showed good exerc capacity, norm BP response, no CP, no evid ischemia, decr uptake inferiorly on rest images similar to 2013, EF=69%, norm wall motion...  LABS 7/15:  Chems- ok w/ BS=126, A1c=7.1.Marland KitchenMarland Kitchen rec to continue Metform500Bid & get on diet, incr exerc, get wt down!!!  ~  June 26, 2014:  26mo ROV & Jesse Santos called for add-on appt due to refractory bronchitic infection; he saw TP 9/28 c/o HA, head congestion, drainage, sore throat & cough w/ clear sput;  given ZPak, Mucinex, Fluids, Hydromet but he notes symptoms continued & this week the sput turned yellow green assoc w/ aching in the chest, chest tightness, chills & sweats; he denies CP or dyspnea... Exam shows currently afeb, stable VS, chest w/ scat rhonchi but no rales or signs of consolidation...  We reviewed his labs & XRays in Epic and he requests Medrol & stronger antibiotic for his symptoms... We discussed Rx w/ Depo80, Medrol dosepak, Levaquin500 x7d, and continue the Mucinex/ fluids/ Hydromet prn/ & Tylenol prn...          Problem List:  HEARING LOSS (ICD-389.9) - he has bilat hearing aides...  ALLERGIC RHINITIS (ICD-477.9) - on allergy shots from DrESL Gaetano Hawthorne)... plus Claritin, Saline, Mucinex, Flonase. ~  occas bouts of sinusitis requiring antibiotic Rx... ~  8/13: he had allergy f/u DrVanWinkle> skin testing 2010 pos to dust mites and molds; doing well on shots once/mo; stable & no changes made... ~  3/14: he presented w/ an upper resp infection & treated w/ Depo, Dosepak, Zithromax... ~  8/14:  He had allergy f/u DrVanWinkle> Advair, Flonase, Saline, Mucinex; he checked FENO- reported normal... ~  7/15: stable on allergy meds from DrVanWinkle, plus AdvairHFA115; he had sinus/ AB exac 5/15 treated by TP w/ ZPak=> Levaquin & resolved...  OBSTRUCTIVE SLEEP APNEA (ICD-327.23) - sleep study 8/04 showed RDI= 45 & desat to 88%... mod snoring and leg jerks without sleep disruption... CPAP perscribed but not using it now- states he can't sleep w/ it on & furthermore he is resting well as long as he stays on his side... denies daytime hypersomnolence & not interested in re-evaluation.  COPD (ICD-496) - ex-smoker, quit 1981 >>  ~  on ADVAIR 100Bid, PROAIR Prn, MUCINEX Bid... he had Pneumovax in 2008... doesn't like Pred Rx...  ~  10/11:  doing well except recent cough, min green sputum (no change in dyspnea)- we will Rx w/ Augmentin, incr Mucinex/ Fluids. ~  4/12:  Breathing at baseline,  continue maintenance meds... ~  CXR 12/13 showed normal heart size, clear lungs, NAD... ~  1/14:  He's had 2 exac this past yr- saw TP w/ Rx & improved... ~  1/15:  He continues to do well on Advair + allergy Rx from DrVanWinkle w/ Flonase, Saline, Mucinex... ~  7/15: exsmoker quit 1981; on AdvairHFA115-2spBid, Mucinex prn; he doesn't like Pred Rx; he had Bronchitic exac treated by TP 5/15 w/ ZPak, then Levaquin, Mucinex, etc & improved; needs incr exercise program... ~  10/15: seen w/ refractory bronchitic episode reqiring ZPak=>Levaquin, Medrol, Mucinex, Hydromet, etc...  HYPERTENSION (ICD-401.9) - prev on diet alone> then on Diovan320, but we decided to change to LOSARTAN 100mg /d 5/13 to save $$ ~  He claims that Norvasc & HCTZ "stopped my urine flow"... ~  10/11:  BP= 128/80 & even better at home he says... denies HA, fatigue, visual changes, CP, palipit, dizziness, syncope, dyspnea, edema, etc... ~  4/12:  Pt has seen TP, DrHochrein, & checking BP daily at home; tried on Norvasc but claims it decr his urine flow; now on Diovan & improved w/ BP= 132/70... ~  5/12:  BP remains well controlled on Diovan 320mg /d= 120/62 today. ~  11/12:  BP= 142/82 on Diovan160 now; denies CP, palpit, dizzy, SOB, edema... ~  5/13:  BP= 128/66 & he's requesting change to cheaper med; rec switch Diovan to LOSARTAN 100mg /d... ~  1/14: on Losar100; BP=140/84, he notes incr HR he says & we decided to add low dose ATENOLOL25mg /d... ~  7/14: on Aten25, Losar100> BP=128/72 & he denies CP, palpit, SOB, edema, etc... ~  1/15: BP controlled on Aten25, Losar100 w/ BP= 130/80 & he denies CP, palpit, SOB, edema. ~  7/15: on Aten25, Losar100; BP=124/70, he denies CP, palpit, dizzy, SOB, edema, etc. ~  10/15: on Aten25, Losar100; BP= 140/60 & remains stable hemodynamically...  R/O CAD (ICD-414.00) - on ASA 81mg /d... Followed by DrHochrein & his notes are reviewed. ~  hx neg cardiac cath 1988 by DrJoe Blencoe... ~   NuclearStressTest 10/02 was norm- no scar or ischemia, EF=55%. ~  CT Abd 4/10 via ER showed coronary calcif... referred to Cards. ~  Eval by DrHochrein 6/10 showed norm EKG & cardiac exam... treadmill ordered. ~  Treadmill 6/10 showed reasonable exerc tolerance, no ischemic changes, few PVCs, sl incr BP... ~  EKG 3/13 showed NSR, rate76, WNL, NAD... ~  Myoview 3/13 was neg> exercised for 80min, stopped for fatigue & CP; no ST seg changes, occas PACs & PVCs; hypertensive BP response; no ischemia, EF=71%, normal wall motion... ~  He saw DrHochrein 4/14> hx CAD, doing satis on ASA & BP rx w/o angina & rec to follow program of aggressive risk factor reduction, no change in meds.  ~  EKG 4/14 showed NSR, rate60, wnl, NAD.. ~  6/15: .on ASA81; followed by DrHochrein- last seen 4/15- no new symptoms, active w/ yard work, EKG showed wnl, they decided to do Treadmill (borderline ST depression), then Myoview 6/15 showed NEG- no ischemia, decr uptake inferiorly same as 2013, EF=69%, norm wall motion, low risk...  MIXED HYPERLIPIDEMIA (ICD-272.2) - low HDL and NIASPAN 500mg /d started 4/09... ~  [place] 4/09 showed TChol 160, TG 106, HDL 26, LDL 113... rec- diet + Niaspan 500/d. ~  FLP 10/09 showed TChol 155, TG 78, HDL 33, LDL  106... ~  FLP 4/10 showed TChol 133, TG 63, HDL 30, LDL 91... rec> continue same. ~  FLP 4/11 showed TChol 144, TG 61, HDL 38, LDL 94 ~  FLP 10/11 showed TChol 155, TG 81, HDL 40, LDL 99 ~  FLP 4/12 showed TChol 142, TG 69, HDL 38, LDL 90 ~  FLP 11/12 showed TChol 165, TG 82, HDL 43, LDL 106 ~  FLP 5/13 in Niasp500 showed TChol 155, TG 72, HDL 46, LDL 95... rec change to generic. ~  FLP 1/14 on Niacin500 showed TChol 140, TG 56, HDL 39, LDL 90 ~  FLP 1/15 on Niaspan500 showed TChol 148, TG 57, HDL 43, LDL 94   DM (ICD-250.00) - on diet + METFORMIN 500mg Bid... he reports BS at home all in the 120-130's... ~  labs 4/09 showed BS= 132, HgA1c= 6.6.Marland KitchenMarland Kitchen rec- same med, better diet... ~  labs  10/09 showed BS= 131, HgA1c= 6.3.Marland KitchenMarland Kitchen ~  labs 4/10 showed BS= 117, HgA1c= 6.2.[name]. rec> same meds/ diet Rx. ~  labs 10/10 showed BS= 138, A1c= 6.5 ~  labs 4/11 showed BS= 121, A1c= 6.5.Marland KitchenMarland Kitchen On Metform500Bid + diet. ~  Dilated eye exam 5/11 by DrShapiro- no retinopathy... ~  labs 10/11 (wt=228#) showed BS= 118, A1c= 7.1.[name]. not as good- get on diet or more meds. ~  Labs 4/12 (wt=229#) showed BS= 119, A1c= 7.3... Wrong direction, may need more meds, get wt down! ~  5/12:  Ophthalmology check by DrShapiro was neg- no retinopathy... ~  Labs 11/12 showed BS= 136, A1c= 7.0 ~  Labs 5/13 on MetformBid showed BS= 132, A1c= 7.1.Marland KitchenMarland Kitchen Continue same, get wt down. ~  Ophthalmology check from DrShapiro 5/13> no DM retinopathy... ~  Labs 1/14 showed BS= 134, A1c= 7.5; not as good- needs better diet, get wt down, same meds for now... ~  Labs 7/14 on Metform500Bid showed BS= 140, A1c= 7.3  ~  Labs 1/154 on Metform500Bid showed BS= 129 & A1c= 7.0, and we reviewed meds/ diet/ exercise/ weight... ~  Labs 7/15 on Metform500Bid showed BS= 126, A1c= 7.1 & reminded of need for diet/ exerc/ wt reduction...  GERD (ICD-530.81) & ? GASTROPARESIS - on PROTONIX 40mg /d, & off Reglan per DrPerry... ~   last EGD 6/07 by DrPerry showed gastic polyp... ~  8/10:  given Compazine suppos for gastroparesis flair by DrGessner. ~  4/12:  C/o intermittent dysphagia & referred back to GI for further eval (colonoscopy due as well)...  DIVERTICULOSIS OF COLON (ICD-562.10) - he uses MIRALAX Bid regularly... IRRITABLE BOWEL SYNDROME (ICD-564.1) COLONIC POLYPS (ICD-211.3) ~  last colonoscopy 5/07 by DrSam showed divertics only... f/u 50yrs. ~  CT Abd 4/10 in ER showed atx bases, coronary calcif, s/p GB, abdAo calcif, 76mm renal stone, divertics, bilat hip AVN. ~  Follow up colonoscopy by DrPerry 5/12 showed severe diverticulosis, 2 sm polyps= tubular adenoma & f/u planned 5 yrs...  RENAL CALCULUS, HX OF (ICD-V13.01) BENIGN PROSTATIC HYPERTROPHY,  HX OF (ICD-V13.8) - on FLOMAX 0.4mg /d w/ improved symptoms (he states flow better since stopping Vit E supplement). ~  4/12:  Routine PSA= 4.9 (it was 1.02 4/11) & we rx w/ Doxy Bid x14d, thewn plan recheck PSA==> 1.30  DEGENERATIVE JOINT DISEASE (ICD-715.90) - pt states "I have a bum knee" and eval by DrDuda w/ viscosupplementation shots... Note: Abd CT w/ bilat hip AVN noted... takes TYLENOL Prn... ~  11/10:  s/p left knee arthroscopy by DrDuda... ~  10/11:  s/p fall w/ left knee injury- s/p  cortisone shot by DrDuda; hx shot in left shoulder too. ~  7/14: he is c/o incr pain in left hip area; known hx AVN & needs f/u by Ortho...  BACK PAIN, LUMBAR (ICD-724.2)  ANXIETY (ICD-300.00) - not currently on meds for nerves.  ANEMIA (ICD-285.9) - prev hx of GIB... 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  Health Maintenance: ~  GI: followed by DrPerry> Colonoscopy 5/07 by DrSam... f/u planned 5 yrs. ~  GU:  See PSAs recorded above... ~  Immunizations: Pneumovax in 2008 @ age 34;  [place] given 7/15; Tetanus- given 4/10;  he gets yearly seasonal Flu vaccine.   Past Surgical History  Procedure Laterality Date  . Cholecystectomy    . Cataract extraction    . Skin cancer excision  3/10    left hand   . Knee surgery  11/10    Outpatient Encounter Prescriptions as of 06/26/2014  Medication Sig  . aspirin 81 MG tablet Take 81 mg by mouth daily.    [name] atenolol (TENORMIN) 25 MG tablet TAKE 1 TABLET EVERY DAY  . Cholecalciferol (VITAMIN D) 2000 UNITS CAPS Take 1 capsule by mouth daily.    . COMPRO 25 MG suppository PLACE 1 SUPPOSITORY (25 MG TOTAL) RECTALLY EVERY 12 (TWELVE) HOURS AS NEEDED FOR NAUSEA.  [name] dextromethorphan-guaiFENesin (MUCINEX DM) 30-600 MG per 12 hr tablet  Take 1 tablet by mouth every 12 (twelve) hours.  . fluticasone (FLONASE) 50 MCG/ACT nasal spray Place 2 sprays into both nostrils daily.  . fluticasone-salmeterol (ADVAIR HFA) 115-21 MCG/ACT inhaler Inhale 1 puff into the lungs 2 (two) times daily.  [name] glucose blood (ONE TOUCH ULTRA TEST) test strip Test blood sugar once daily as directed  . HYDROcodone-homatropine (HYDROMET) 5-1.5 MG/5ML syrup Take 2.5 mLs by mouth at bedtime as needed for cough.  . hyoscyamine (LEVSIN SL) 0.125 MG SL tablet Place 1 tablet (0.125 mg total) under the tongue every 4 (four) hours as needed. As needed for abdominal cramping  . loratadine (CLARITIN) 10 MG tablet Take 10 mg by mouth daily as needed for allergies.  [name] losartan (COZAAR) 100 MG tablet TAKE 1 TABLET BY MOUTH EVERY DAY  . metFORMIN (GLUCOPHAGE) 500 MG tablet TAKE 1 TABLET TWICE A DAY WITH A MEAL  . Multiple Vitamins-Minerals (MULTIVITAMIN WITH MINERALS) tablet Take 1 tablet by mouth daily.    . niacin (NIASPAN) 500 MG CR tablet Take 1 tablet (500 mg total) by mouth at bedtime.  . nitroGLYCERIN (NITROSTAT) 0.4 MG SL tablet Place 1 tablet (0.4 mg total) under the tongue every 5 (five) minutes as needed for chest pain.  . pantoprazole (PROTONIX) 40 MG tablet Take 1 tablet (40 mg total) by mouth daily.  . polyethylene glycol powder (GLYCOLAX/MIRALAX) powder TAKE 17 GRAMS BY MOUTH TWICE A DAY AS DIRECTED  . sodium chloride (OCEAN) 0.65 % nasal spray 1 spray by Nasal route as needed.    . tamsulosin (FLOMAX) 0.4 MG CAPS capsule TAKE ONE CAPSULE TWICE A DAY  . traMADol (ULTRAM) 50 MG tablet TAKE 1 TABLET THREE TIMES A DAY AS NEEDED FOR PAIN    Allergies  Allergen Reactions  . Prednisone     REACTION: high dose  intolerance    Current Medications, Allergies, Past Medical History, Past Surgical History, Family History, and Social History were reviewed in [employer] record.    Review of Systems         See HPI - all other systems neg  except as noted... The patient complains of decreased hearing, dyspnea on exertion, headaches, and difficulty walking.  The patient denies anorexia, fever, weight loss, weight gain, vision loss, hoarseness, chest pain, syncope, peripheral edema, prolonged cough, hemoptysis, abdominal pain, melena, hematochezia, severe indigestion/heartburn, hematuria, incontinence, muscle weakness, suspicious skin lesions, transient blindness, depression, unusual weight change, abnormal bleeding, enlarged lymph nodes, and angioedema.     Objective:   Physical Exam      WD, WN, 77 y/o WM in NAD... GENERAL:  Alert & oriented; pleasant & cooperative... HEENT:  [place]/AT, EOM-full, PERRLA, EACs-clear, TMs-wnl, NOSE- pale, congested, THROAT-clear & wnl. NECK:  Supple w/ fairROM; no JVD; normal carotid impulses w/o bruits; no thyromegaly or nodules palpated; no lymphadenopathy. CHEST:  Clear to P & A except few bibasilar rhonchi noted; min exp wheezing, no signs of consolidation.[name] HEART:  Regular Rhythm; without murmurs/ rubs/ or gallops detected...  ABDOMEN:  Soft & nontender; normal bowel sounds; no organomegaly or masses palpated., no guarding, or rebound EXT: without deformities, mild arthritic changes; no varicose veins/ venous insuffic/ or edema. NEURO:  CN's intact;  no focal neuro deficits... DERM:  No lesions noted; no rash etc...  RADIOLOGY DATA:  Reviewed in the EPIC EMR & discussed w/ the patient...  LABORATORY DATA:  Reviewed in the EPIC EMR & discussed w/ the patient...   Assessment & Plan:    Acute Bronchitis >> presented 10/15 w/ refractory bronchitic episode; given ZPak=>Levaquin, Depo80/ Medrol dosepak, Mucinex, Hydromet, etc... COPD exac>  He continues on Advair, Proair rescue, Mucinex; also Claritin, Flonase, Saline; stable continue same Rx... ~  7/15: given Prevnar-13  HBP>  On LOSARTAN 100mg /d & [id number] w/ good control of BP, continue same...  R/O CAD>  Followed by DrHochrein, and doing  satis w/ neg Myoview 6/15... continue ASA & risk factor reduction....  Lipids>  Looks satis on his diet + Niacin...  DM>  Control is fair w/ A1c stable at 7.1; he was told there are more meds in his future if he doesn't get his wt down (currently~230#)...  GI>  Per DrPerry w/ GERD, ?Gastroparesis, Divertics, IBS, Colon polyps> f/u colon 5/12 w/ divertics & 2 sm adenomas, repeat planned ~47yrs...  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  Anxiety>  Certainly an issue, he does not want anxiolytic Rx...   Patient's Medications  New Prescriptions   LEVOFLOXACIN (LEVAQUIN) 500 MG TABLET    Take 1 tablet (500 mg total) by mouth daily.   METHYLPREDNISOLONE (MEDROL DOSPACK) 4 MG TABLET    follow package directions  Previous Medications   ASPIRIN 81 MG TABLET    Take 81 mg by mouth daily.     ATENOLOL (TENORMIN) 25 MG TABLET    TAKE 1 TABLET EVERY DAY   CHOLECALCIFEROL (VITAMIN D) 2000 UNITS CAPS    Take 1 capsule by mouth daily.     COMPRO 25 MG SUPPOSITORY    PLACE 1 SUPPOSITORY (25 MG TOTAL) RECTALLY EVERY 12 (TWELVE) HOURS AS NEEDED FOR NAUSEA.   DEXTROMETHORPHAN-GUAIFENESIN (MUCINEX DM) 30-600 MG PER 12 HR TABLET    Take 1 tablet by mouth every 12 (twelve) hours.   FLUTICASONE-SALMETEROL (ADVAIR  HFA) 115-21 MCG/ACT INHALER    Inhale 1 puff into the lungs 2 (two) times daily.   GLUCOSE BLOOD (ONE TOUCH ULTRA TEST) TEST STRIP    Test blood sugar once daily as directed   HYDROCODONE-HOMATROPINE (HYDROMET) 5-1.5 MG/5ML SYRUP    Take 2.5 mLs by mouth at bedtime as needed for cough.   HYOSCYAMINE (LEVSIN SL) 0.125 MG SL TABLET    Place 1 tablet (0.125 mg total) under the tongue every 4 (four) hours as needed. As needed for abdominal cramping   LORATADINE (CLARITIN) 10 MG TABLET    Take 10 mg by mouth daily as needed for allergies.   LOSARTAN (COZAAR) 100 MG TABLET    TAKE 1 TABLET BY MOUTH EVERY DAY   METFORMIN (GLUCOPHAGE) 500 MG TABLET    TAKE 1  TABLET TWICE A DAY WITH A MEAL   MULTIPLE VITAMINS-MINERALS (MULTIVITAMIN WITH MINERALS) TABLET    Take 1 tablet by mouth daily.     NIACIN (NIASPAN) 500 MG CR TABLET    Take 1 tablet (500 mg total) by mouth at bedtime.   NITROGLYCERIN (NITROSTAT) 0.4 MG SL TABLET    Place 1 tablet (0.4 mg total) under the tongue every 5 (five) minutes as needed for chest pain.   PANTOPRAZOLE (PROTONIX) 40 MG TABLET    Take 1 tablet (40 mg total) by mouth daily.   POLYETHYLENE GLYCOL POWDER (GLYCOLAX/MIRALAX) POWDER    TAKE 17 GRAMS BY MOUTH TWICE A DAY AS DIRECTED   SODIUM CHLORIDE (OCEAN) 0.65 % NASAL SPRAY    1 spray by Nasal route as needed.     TAMSULOSIN (FLOMAX) 0.4 MG CAPS CAPSULE    TAKE ONE CAPSULE TWICE A DAY   TRAMADOL (ULTRAM) 50 MG TABLET    TAKE 1 TABLET THREE TIMES A DAY AS NEEDED FOR PAIN  Modified Medications   Modified Medication Previous Medication   FLUTICASONE (FLONASE) 50 MCG/ACT NASAL SPRAY fluticasone (FLONASE) 50 MCG/ACT nasal spray      Place 2 sprays into both nostrils daily.    Place 2 sprays into both nostrils daily.  Discontinued Medications   No medications on file

## 2014-07-09 ENCOUNTER — Ambulatory Visit (INDEPENDENT_AMBULATORY_CARE_PROVIDER_SITE_OTHER): Payer: Medicare Other

## 2014-07-09 DIAGNOSIS — Z23 Encounter for immunization: Secondary | ICD-10-CM | POA: Diagnosis not present

## 2014-08-13 ENCOUNTER — Other Ambulatory Visit: Payer: Self-pay | Admitting: Pulmonary Disease

## 2014-08-14 ENCOUNTER — Telehealth: Payer: Self-pay | Admitting: Pulmonary Disease

## 2014-08-21 NOTE — Telephone Encounter (Signed)
Patient requesting refill of Tramadol.  Last refill: 08/14/13; LOV 06/26/14; next OV 10/10/14.  Ok to refill?

## 2014-08-24 ENCOUNTER — Other Ambulatory Visit: Payer: Self-pay | Admitting: Pulmonary Disease

## 2014-08-24 MED ORDER — TRAMADOL HCL 50 MG PO TABS: ORAL_TABLET | ORAL | Status: DC

## 2014-08-27 ENCOUNTER — Telehealth: Payer: Self-pay | Admitting: Pulmonary Disease

## 2014-08-27 MED ORDER — LEVOFLOXACIN 500 MG PO TABS: 500.0000 mg | ORAL_TABLET | Freq: Every day | ORAL | Status: DC

## 2014-08-27 MED ORDER — METHYLPREDNISOLONE 4 MG PO KIT: PACK | ORAL | Status: DC

## 2014-08-27 NOTE — Telephone Encounter (Signed)
Called and spoke to pt. Pt c/o sore throat, prod cough with light green mucus, chest soreness- feels "raw", PND and headache x 5 days. Pt stated he is taking Mucinex DM, flonase, chloreseptic spray, hydromet and tylenol for symptoms. Pt stated the hydromet helps a little. Pt denies change in SOB from baseline and f/c/s. Pt last seen in 06/26/14 by SN.   Dr. Lenna Gilford please advise.   Allergies  Allergen Reactions  . Prednisone     REACTION: high dose intolerance    Current Outpatient Prescriptions on File Prior to Visit  Medication Sig Dispense Refill  . aspirin 81 MG tablet Take 81 mg by mouth daily.      [name] atenolol (TENORMIN) 25 MG tablet TAKE 1 TABLET EVERY DAY 30 tablet 11  . atenolol (TENORMIN) 25 MG tablet TAKE 1 TABLET EVERY DAY 30 tablet 11  . Cholecalciferol (VITAMIN D) 2000 UNITS CAPS Take 1 capsule by mouth daily.      . COMPRO 25 MG suppository PLACE 1 SUPPOSITORY (25 MG TOTAL) RECTALLY EVERY 12 (TWELVE) HOURS AS NEEDED FOR NAUSEA. 12 suppository 0  . dextromethorphan-guaiFENesin (MUCINEX DM) 30-600 MG per 12 hr tablet Take 1 tablet by mouth every 12 (twelve) hours.    . fluticasone (FLONASE) 50 MCG/ACT nasal spray Place 2 sprays into both nostrils daily. 16 g 11  . fluticasone-salmeterol (ADVAIR HFA) 115-21 MCG/ACT inhaler Inhale 1 puff into the lungs 2 (two) times daily. 1 Inhaler 11  . glucose blood (ONE TOUCH ULTRA TEST) test strip Test blood sugar once daily as directed 100 each 6  . HYDROcodone-homatropine (HYDROMET) 5-1.5 MG/5ML syrup Take 2.5 mLs by mouth at bedtime as needed for cough. 240 mL 0  . hyoscyamine (LEVSIN SL) 0.125 MG SL tablet Place 1 tablet (0.125 mg total) under the tongue every 4 (four) hours as needed. As needed for abdominal cramping 90 tablet 5  . levofloxacin (LEVAQUIN) 500 MG tablet Take 1 tablet (500 mg total) by mouth daily. 7 tablet 0  . loratadine (CLARITIN) 10 MG tablet Take 10 mg by mouth daily as needed for allergies.    [name] losartan (COZAAR) 100 MG  tablet TAKE 1 TABLET BY MOUTH EVERY DAY 90 tablet 1  . metFORMIN (GLUCOPHAGE) 500 MG tablet TAKE 1 TABLET TWICE A DAY WITH A MEAL 60 tablet 11  . methylPREDNIsolone (MEDROL DOSPACK) 4 MG tablet follow package directions 21 tablet 0  . Multiple Vitamins-Minerals (MULTIVITAMIN WITH MINERALS) tablet Take 1 tablet by mouth daily.      . niacin (NIASPAN) 500 MG CR tablet Take 1 tablet (500 mg total) by mouth at bedtime. 30 tablet 11  . nitroGLYCERIN (NITROSTAT) 0.4 MG SL tablet Place 1 tablet (0.4 mg total) under the tongue every 5 (five) minutes as needed for chest pain. 30 tablet 11  . pantoprazole (PROTONIX) 40 MG tablet Take 1 tablet (40 mg total) by mouth daily. 30 tablet 11  . polyethylene glycol powder (GLYCOLAX/MIRALAX) powder TAKE 17 GRAMS BY MOUTH TWICE A DAY AS DIRECTED 1054 g 11  . sodium chloride (OCEAN) 0.65 % nasal spray 1 spray by Nasal route as needed.      . tamsulosin (FLOMAX) 0.4 MG CAPS capsule TAKE ONE CAPSULE TWICE A DAY 60 capsule 5  . traMADol (ULTRAM) 50 MG tablet TAKE 1 TABLET THREE TIMES A DAY AS NEEDED FOR PAIN 90 tablet 5   No current facility-administered medications on file prior to visit.

## 2014-08-27 NOTE — Telephone Encounter (Signed)
Per SN-  Take Levaquin 500mg  #7, 1 tab once a day. Medrol Dose pack and take OTC align once a day.   Called and spoke to pt. Informed pt of the recs per SN. Rx sent to preferred pharmacy. Pt verbalized understanding and denied any further questions or concerns at this time.

## 2014-10-10 ENCOUNTER — Ambulatory Visit (INDEPENDENT_AMBULATORY_CARE_PROVIDER_SITE_OTHER): Payer: Medicare Other | Admitting: Pulmonary Disease

## 2014-10-10 ENCOUNTER — Other Ambulatory Visit (INDEPENDENT_AMBULATORY_CARE_PROVIDER_SITE_OTHER): Payer: Medicare Other

## 2014-10-10 ENCOUNTER — Encounter (INDEPENDENT_AMBULATORY_CARE_PROVIDER_SITE_OTHER): Payer: Self-pay

## 2014-10-10 ENCOUNTER — Encounter: Payer: Self-pay | Admitting: Pulmonary Disease

## 2014-10-10 ENCOUNTER — Ambulatory Visit (INDEPENDENT_AMBULATORY_CARE_PROVIDER_SITE_OTHER)
Admission: RE | Admit: 2014-10-10 | Discharge: 2014-10-10 | Disposition: A | Discharge status: Home / Self Care | Payer: Medicare Other | Source: Ambulatory Visit | Attending: Pulmonary Disease | Admitting: Pulmonary Disease

## 2014-10-10 VITALS — BP 124/78 | HR 70 | Temp 97.1°F | Ht 73.0 in | Wt 222.4 lb

## 2014-10-10 DIAGNOSIS — E782 Mixed hyperlipidemia: Secondary | ICD-10-CM | POA: Diagnosis not present

## 2014-10-10 DIAGNOSIS — N401 Enlarged prostate with lower urinary tract symptoms: Secondary | ICD-10-CM | POA: Diagnosis not present

## 2014-10-10 DIAGNOSIS — K5730 Diverticulosis of large intestine without perforation or abscess without bleeding: Secondary | ICD-10-CM

## 2014-10-10 DIAGNOSIS — E119 Type 2 diabetes mellitus without complications: Secondary | ICD-10-CM

## 2014-10-10 DIAGNOSIS — M159 Polyosteoarthritis, unspecified: Secondary | ICD-10-CM

## 2014-10-10 DIAGNOSIS — N138 Other obstructive and reflux uropathy: Secondary | ICD-10-CM

## 2014-10-10 DIAGNOSIS — J449 Chronic obstructive pulmonary disease, unspecified: Secondary | ICD-10-CM | POA: Diagnosis not present

## 2014-10-10 DIAGNOSIS — F419 Anxiety disorder, unspecified: Secondary | ICD-10-CM

## 2014-10-10 DIAGNOSIS — K3184 Gastroparesis: Secondary | ICD-10-CM

## 2014-10-10 DIAGNOSIS — J441 Chronic obstructive pulmonary disease with (acute) exacerbation: Secondary | ICD-10-CM | POA: Diagnosis not present

## 2014-10-10 DIAGNOSIS — M545 Low back pain, unspecified: Secondary | ICD-10-CM

## 2014-10-10 DIAGNOSIS — I2510 Atherosclerotic heart disease of native coronary artery without angina pectoris: Secondary | ICD-10-CM | POA: Diagnosis not present

## 2014-10-10 DIAGNOSIS — K589 Irritable bowel syndrome without diarrhea: Secondary | ICD-10-CM | POA: Diagnosis not present

## 2014-10-10 DIAGNOSIS — M150 Primary generalized (osteo)arthritis: Secondary | ICD-10-CM | POA: Diagnosis not present

## 2014-10-10 DIAGNOSIS — I10 Essential (primary) hypertension: Secondary | ICD-10-CM | POA: Diagnosis not present

## 2014-10-10 LAB — CBC WITH DIFFERENTIAL/PLATELET
BASOS ABS: 0 10*3/uL (ref 0.0–0.1)
Basophils Relative: 0.7 % (ref 0.0–3.0)
EOS ABS: 0.1 10*3/uL (ref 0.0–0.7)
EOS PCT: 1.5 % (ref 0.0–5.0)
HCT: 41 % (ref 39.0–52.0)
Hemoglobin: 14 g/dL (ref 13.0–17.0)
LYMPHS ABS: 1.4 10*3/uL (ref 0.7–4.0)
Lymphocytes Relative: 26.5 % (ref 12.0–46.0)
MCHC: 34.1 g/dL (ref 30.0–36.0)
MCV: 87.1 fl (ref 78.0–100.0)
MONO ABS: 0.4 10*3/uL (ref 0.1–1.0)
MONOS PCT: 7.6 % (ref 3.0–12.0)
Neutro Abs: 3.3 10*3/uL (ref 1.4–7.7)
Neutrophils Relative %: 63.7 % (ref 43.0–77.0)
Platelets: 205 10*3/uL (ref 150.0–400.0)
RBC: 4.71 Mil/uL (ref 4.22–5.81)
RDW: 13.2 % (ref 11.5–15.5)
WBC: 5.2 10*3/uL (ref 4.0–10.5)

## 2014-10-10 LAB — HEMOGLOBIN A1C: Hgb A1c MFr Bld: 7.2 % — ABNORMAL HIGH (ref 4.6–6.5)

## 2014-10-10 LAB — PSA: PSA: 1.25 ng/mL (ref 0.10–4.00)

## 2014-10-10 LAB — TSH: TSH: 1.59 u[IU]/mL (ref 0.35–4.50)

## 2014-10-10 MED ORDER — FLUTICASONE-SALMETEROL 115-21 MCG/ACT IN AERO: 1.0000 | INHALATION_SPRAY | Freq: Two times a day (BID) | RESPIRATORY_TRACT | Status: DC

## 2014-10-10 MED ORDER — HYOSCYAMINE SULFATE 0.125 MG SL SUBL: 0.1250 mg | SUBLINGUAL_TABLET | SUBLINGUAL | Status: DC | PRN

## 2014-10-10 MED ORDER — TAMSULOSIN HCL 0.4 MG PO CAPS: ORAL_CAPSULE | ORAL | Status: DC

## 2014-10-10 MED ORDER — NIACIN ER (ANTIHYPERLIPIDEMIC) 500 MG PO TBCR: 500.0000 mg | EXTENDED_RELEASE_TABLET | Freq: Every day | ORAL | Status: DC

## 2014-10-10 MED ORDER — PANTOPRAZOLE SODIUM 40 MG PO TBEC: 40.0000 mg | DELAYED_RELEASE_TABLET | Freq: Every day | ORAL | Status: DC

## 2014-10-10 MED ORDER — FLUTICASONE PROPIONATE 50 MCG/ACT NA SUSP: 2.0000 | Freq: Every day | NASAL | Status: DC

## 2014-10-10 NOTE — Patient Instructions (Signed)
Today we updated your med list in our EPIC system...    Continue your current medications the same...  Today we did your follow up CXR & FASTING blood work...    We will contact you w/ the results when available...   Keep up the good work w/ your diet, & try to incr your exercise program...  Call for any questions...  Let's plan a follow up visit in 66mo, sooner if needed for problems.Marland KitchenMarland Kitchen

## 2014-10-10 NOTE — Progress Notes (Addendum)
Subjective:    Patient ID: Jesse Santos, male    DOB: [birth year], 79 y.o.   MRN: [id number]  HPI 79 y/o WM w/ mult med problems here for a follow up visit>  ~  SEE PREV EOPIC NOTES FOR OLDER DATA >>   ~  September 15, 2012:  21mo ROV & Jesse Santos is c/o some pain in his neck=> we will Rx w/ Tramadol for prn use;  He also notes that his pulse seems "higher" since he started the Losartan, pulse= 87 reg today, BP=140/84, and we discussed adding Aten25mg /d to his med regimen...  We reviewed the following medical problems during today's office visit>>     HOH> he has hearing aides...    AR> on allergy shots, OTC antihist, saline, Nasonex; gets occas bouts of sinusitis but iproved this past yr...    OSA> Sleep Study was in 204 w/ RDI=45 & desat to 88% w/ mod snoring & leg jerks, but he states he never could use the CPAP effectively & he sleeps fine if he stays on his side; denies daytime sleepiness or other issues...    COPD> exsmoker quit 1981; on AdvairHFA115-2spBid, Mucinex prn; he doesn't like Pred Rx; he had Bronchitic exac treated by TP 12/13 w/ Augmentin, Mucinex, etc & improved; needs incr exercise program...    HBP> on Losar100; BP=140/84, he notes incr HR he says & we decided to add low dose ATEN25mg /d...    CAD> on ASA81; followed by DrHochrein- CT Abd 2010 showed coronary calcif, Treadmill was neg; Myoview 3/13 was neg, wnl...    Hyperlipid> on Niacin500mg ; FLP showed TChol 140, TG 56, HDL 39, LDL 90; continue same, get wt down...    DM> on Metform500Bid; Labs showed BS=134, A1c=7.5; wt=228# (no change) rec to continue same med, better diet, get wt down.[name]    GI- GERD, dysphagia, Divertics, IBS, Polyps> on Protonix40, Miralax, Levsin0.125 prn; followed by DrPerry & stable- last colon 5/12 w/ severe divertics & 2 sm adenomas removed...    GU- Kid stone, BPH> on Flomax0.4 & Cialis prn; PSA remains wnl at 1.10    DJD, LBP> left knee complaints w/ shots from DrDuda, similar for left shoulder; hx bilat  hip AVN noted on prev CT Abd but he has min complaints and uses Tylenol prn...    Anxiety> he does not want anxiolytic meds.. We reviewed prob list, meds, xrays and labs> see below for updates >> he had the 2013 Flu vaccine 9/13... Meds refilled today per request...  LABS 1/14:  FLP- at goals on Niacin rx;  Chems- ok w/ BS=134, A1c=7.5;  CBC- wnl;  TSH=1.13;  VitD=42;  PSA=1.10      ~  December 02, 2012:  2-29mo ROV & add-on for 1wk hx right ear pain, dizzy, gums sore, aching, HA w/ pressure sensation;  Exam shows afeb, ears clear, TMs ok, sl pharyngeal erythema w/o exud, chest w/ scat rhonchi;  We decided to treat w/ Depo80, Medrol dosepak, & ZPak...  He has Tramadol & Tylenol to use for pain... We reviewed prob list, meds, xrays and labs> see below for updates >>   ~  March 23, 2013:  3-49mo ROV & Jesse Santos has several minor complants> He fell w/ abrasion on left knee w/ resultant cellulitis- went to ER & had f/u w/ TP, now resolved after Keflex Rx; c/o prob w/ BPH & obstructive symptoms w/ nocturia & decr flow despite Flomax0.4mg /d- he would like to incr to Bid before considering Urology referral; also c/o  some left hip pain- known AVN from prev evals & usually controlled w/ Tylenol alone; Rec to use the Tramadol + Tylenol; he is established w/ drDuda but hasn't had re-eval in some time & encouraged to f/u w/ Ortho at his convenience...    Breathing is good on AdvairMDI-115-21 at 2spBid + his allergy shots & meds; denies cough, sput, dyspnea, CP, etc...    BP controlled on Aten25, Losar100> BP=128/72 & he denies CP, palpit, SOB, edema, etc...     He saw DrHochrein 4/14> hx CAD, doing satis on ASA & BP rx w/o angina & rec to follow program of aggressive risk factor reduction, no change in meds...    Lipids are controlled on diet + Niacin;  DM is treated w/ Metform500Bid- he deperately needs to diet, incr exercise & get the weight down; Labs 7/14 showed BS=140, A1c=7.3 We reviewed prob list, meds, xrays and  labs> see below for updates >> meds refilled per request today...  LABS 7/14:  Chems- ok x BS=140, A1c=7.3.Marland KitchenMarland Kitchen   ~  October 04, 2013:  80mo ROV & Jesse Santos reports that DrDuda plans a left THR (for avasc necreosis) whenever he is ready, but he's not rushing it, on Tramadol prn & doing satis for now...     Breathing is stable on Advair115 MDI along w/ Mucinex, fluids, nasal saline, plus claritin...     BP controlled on Aten25, Losar100 w/ BP= 130/80 & he denies CP, palpit, SOB, edema...    Chol looks good on diet + Niasp500; FLP 1/15 showed TChol 148, TG 57, HDL 43, LDL 94     He remains on Metform500Bid & low carb diet for his DM- weight = 227# (no change w/ BMI=29-30), BS= 129 & A1c= 7.0, and we discussed diet, exercise, & weight reduction strategies...    GI is also stable on Protonix40, Miralax, & Levsin as needed; he denies abd pain, dysphagia, n/v, c/d, blood seen...     GU is ok w/ Flomax0.4.Marland KitchenMarland Kitchen  We reviewed prob list, meds, xrays and labs> see below for updates >>   LABS 1/15:  FLP- at goals on diet+Niaspan;  Chesm- ok w/ BS=129, A1c=7.0;  CBC- wnl;  TSH=1.06...  ~  April 05, 2014:  18mo ROV & reports a good interval, no new complaints or concerns- notes mild intermit LTOS, wants B12 level checked (on Metformin), wants Prevnar-13 shot... We reviewed the following medical problems during today's office visit >>     HOH> he has bilat hearing aides...    AR> on allergy shots, OTC antihist, Saline, Flonase; gets occas bouts of sinusitis but overall improved, had ZPak & Levaquin in May from TP=> resolved...    OSA> Sleep Study was in 2/04 w/ RDI=45 & desat to 88% w/ mod snoring & leg jerks, but he states he never could use the CPAP effectively & states he sleeps fine if he stays on his side; denies daytime sleepiness or other issues...    COPD> exsmoker quit 1981; on AdvairHFA115-2spBid, Mucinex prn; he doesn't like Pred Rx; he had Bronchitic exac treated by TP 5/15 w/ ZPak, then Levaquin, Mucinex, etc &  improved; needs incr exercise program...    HBP> on Aten25, Losar100; BP=124/70, he denies CP, palpit, dizzy, SOB, edema, etc...    CAD> on ASA81; followed by DrHochrein- CT Abd 2010 showed coronary calcif, Treadmill was neg; Myoview 3/13 was neg, wnl; last seen 4/15- no new symptoms, active w/ yard work, EKG showed wnl, they decided to do Treadmill (borderline  ST depression), then Myoview 6/15 showed NEG- no ischemia, decr uptake inferiorly same as 2013, EF=69%, norm wall motion, low risk...    Hyperlipid> on Niacin500mg ; FLP 1/15 showed TChol 148, TG 57, HDL 43, LDL 94; continue same, get wt down...    DM> on Metform500Bid; wt=224#, BMI=29; Labs 7/15 showed BS=126, A1c=7.1; rec to continue same med, better diet, get wt down!..    GI- GERD, dysphagia, Divertics, IBS, Polyps> on Protonix40, Miralax, Levsin0.125 prn; followed by DrPerry & stable- last colon 5/12 w/ severe divertics & 2 sm adenomas removed...    GU- Kid stone, BPH> on Flomax0.4 & Cialis prn; PSA remains wnl...    DJD, LBP> left knee complaints w/ shots from DrDuda, similar for left shoulder; hx bilat hip AVN noted on prev CT Abd but he has min complaints and uses Tylenol prn (holding off on surg)...    Anxiety> he does not want anxiolytic meds.. We reviewed prob list, meds, xrays and labs> see below for updates >>   EKG 4/15 showed NSR, rate70, wnl, NAD...  Treadmill 6/15 (DrBrackbill) showed borderline ST segment changes w/ exercise & a run of atrial tachy reported...   Myoview 6/15 showed good exerc capacity, norm BP response, no CP, no evid ischemia, decr uptake inferiorly on rest images similar to 2013, EF=69%, norm wall motion...  LABS 7/15:  Chems- ok w/ BS=126, A1c=7.1.Marland KitchenMarland Kitchen rec to continue Metform500Bid & get on diet, incr exerc, get wt down!!!  ~  June 26, 2014:  77mo ROV & Jesse Santos called for add-on appt due to refractory bronchitic infection; he saw TP 9/28 c/o HA, head congestion, drainage, sore throat & cough w/ clear sput;  given ZPak, Mucinex, Fluids, Hydromet but he notes symptoms continued & this week the sput turned yellow green assoc w/ aching in the chest, chest tightness, chills & sweats; he denies CP or dyspnea... Exam shows currently afeb, stable VS, chest w/ scat rhonchi but no rales or signs of consolidation...  We reviewed his labs & XRays in Epic and he requests Medrol & stronger antibiotic for his symptoms... We discussed Rx w/ Depo80, Medrol dosepak, Levaquin500 x7d, and continue the Mucinex/ fluids/ Hydromet prn/ & Tylenol prn...   ~  October 10, 2014:  26mo ROV & Jesse Santos indicates it took several wks to get over his URI/AB episode in Oct;      On Advair115-2spBid, Mucinex, flonase, claritin; he reports breathing is stable- min cough, no sput, stable DOE, no edema...     He remains on Niasp500/d; FLP 1/16 showed TChol 150, TG 101, HDL 45, LDL 85    He continues Metform500Bid for his DM; Labs 1/16 showed BS=133, A1c=7.2    He has some lower abd pain/ cramping & has used the Levsin w/ relief- c/w IBS, some diarrhea, nausea, groin discomfort; he had colonoscopy 2012 w/ 2 polyps removed & f/u planned 2017...     He sees DrDuda for Ortho & takes Tramadol50 aS NEEDED... We reviewed prob list, meds, xrays and labs> see below for updates >>   CXR 1/16 showed norm heart size, clear lungs, DJD in Tspine, NAD...  LABS 1/16:  FLP- at goals on Niasp;  Chems- ok w/ BS=133 A1c=7.2;  CBC- wnl;  TSH=1.59;  PSA=1.25...           Problem List:  HEARING LOSS (ICD-389.9) - he has bilat hearing aides...  ALLERGIC RHINITIS (ICD-477.9) - on allergy shots from DrESL Gaetano Hawthorne)... plus Claritin, Saline, Mucinex, Flonase. ~  occas bouts of sinusitis  requiring antibiotic Rx... ~  8/13: he had allergy f/u DrVanWinkle> skin testing 2010 pos to dust mites and molds; doing well on shots once/mo; stable & no changes made... ~  3/14: he presented w/ an upper resp infection & treated w/ Depo, Dosepak, Zithromax... ~  8/14:  He had  allergy f/u DrVanWinkle> Advair, Flonase, Saline, Mucinex; he checked FENO- reported normal... ~  7/15: stable on allergy meds from DrVanWinkle, plus AdvairHFA115; he had sinus/ AB exac 5/15 treated by TP w/ ZPak=> Levaquin & resolved...  OBSTRUCTIVE SLEEP APNEA (ICD-327.23) - sleep study 8/04 showed RDI= 45 & desat to 88%... mod snoring and leg jerks without sleep disruption... CPAP perscribed but not using it now- states he can't sleep w/ it on & furthermore he is resting well as long as he stays on his side... denies daytime hypersomnolence & not interested in re-evaluation.  COPD (ICD-496) - ex-smoker, quit 1981 >>  ~  on ADVAIR 100Bid, PROAIR Prn, MUCINEX Bid... he had Pneumovax in 2008... doesn't like Pred Rx...  ~  10/11:  doing well except recent cough, min green sputum (no change in dyspnea)- we will Rx w/ Augmentin, incr Mucinex/ Fluids. ~  4/12:  Breathing at baseline, continue maintenance meds... ~  CXR 12/13 showed normal heart size, clear lungs, NAD... ~  1/14:  He's had 2 exac this past yr- saw TP w/ Rx & improved... ~  1/15:  He continues to do well on Advair + allergy Rx from DrVanWinkle w/ Flonase, Saline, Mucinex... ~  7/15: exsmoker quit 1981; on AdvairHFA115-2spBid, Mucinex prn; he doesn't like Pred Rx; he had Bronchitic exac treated by TP 5/15 w/ ZPak, then Levaquin, Mucinex, etc & improved; needs incr exercise program... ~  10/15: seen w/ refractory bronchitic episode reqiring ZPak=>Levaquin, Medrol, Mucinex, Hydromet, etc... ~  1/16: He had a URI/ AB exac treated 10/15 w/ Levaquin/ Medrol/ Mucinex & resolved; now at baseline on Advair115-2spBid etc...  HYPERTENSION (ICD-401.9) - prev on diet alone> then on Diovan320, but we decided to change to LOSARTAN 100mg /d 5/13 to save $$ ~  He claims that Norvasc & HCTZ "stopped my urine flow"... ~  10/11:  BP= 128/80 & even better at home he says... denies HA, fatigue, visual changes, CP, palipit, dizziness, syncope, dyspnea, edema,  etc... ~  4/12:  Pt has seen TP, DrHochrein, & checking BP daily at home; tried on Norvasc but claims it decr his urine flow; now on Diovan & improved w/ BP= 132/70... ~  5/12:  BP remains well controlled on Diovan 320mg /d= 120/62 today. ~  11/12:  BP= 142/82 on Diovan160 now; denies CP, palpit, dizzy, SOB, edema... ~  5/13:  BP= 128/66 & he's requesting change to cheaper med; rec switch Diovan to LOSARTAN 100mg /d... ~  1/14: on Losar100; BP=140/84, he notes incr HR he says & we decided to add low dose ATENOLOL25mg /d... ~  7/14: on Aten25, Losar100> BP=128/72 & he denies CP, palpit, SOB, edema, etc... ~  1/15: BP controlled on Aten25, Losar100 w/ BP= 130/80 & he denies CP, palpit, SOB, edema. ~  7/15: on Aten25, Losar100; BP=124/70, he denies CP, palpit, dizzy, SOB, edema, etc. ~  10/15: on Aten25, Losar100; BP= 140/60 & remains stable hemodynamically...  R/O CAD (ICD-414.00) - on ASA 81mg /d... Followed by DrHochrein & his notes are reviewed. ~  hx neg cardiac cath 1988 by DrJoe D'Hanis... ~  NuclearStressTest 10/02 was norm- no scar or ischemia, EF=55%. ~  CT Abd 4/10 via ER showed coronary calcif.Marland KitchenMarland Kitchen  referred to Cards. ~  Eval by DrHochrein 6/10 showed norm EKG & cardiac exam... treadmill ordered. ~  Treadmill 6/10 showed reasonable exerc tolerance, no ischemic changes, few PVCs, sl incr BP... ~  EKG 3/13 showed NSR, rate76, WNL, NAD... ~  Myoview 3/13 was neg> exercised for 27min, stopped for fatigue & CP; no ST seg changes, occas PACs & PVCs; hypertensive BP response; no ischemia, EF=71%, normal wall motion... ~  He saw DrHochrein 4/14> hx CAD, doing satis on ASA & BP rx w/o angina & rec to follow program of aggressive risk factor reduction, no change in meds.  ~  EKG 4/14 showed NSR, rate60, wnl, NAD.. ~  6/15: .on ASA81; followed by DrHochrein- last seen 4/15- no new symptoms, active w/ yard work, EKG showed wnl, they decided to do Treadmill (borderline ST depression), then Myoview 6/15  showed NEG- no ischemia, decr uptake inferiorly same as 2013, EF=69%, norm wall motion, low risk...  MIXED HYPERLIPIDEMIA (ICD-272.2) - low HDL and NIASPAN 500mg /d started 4/09... ~  [place] 4/09 showed TChol 160, TG 106, HDL 26, LDL 113... rec- diet + Niaspan 500/d. ~  FLP 10/09 showed TChol 155, TG 78, HDL 33, LDL 106... ~  FLP 4/10 showed TChol 133, TG 63, HDL 30, LDL 91... rec> continue same. ~  FLP 4/11 showed TChol 144, TG 61, HDL 38, LDL 94 ~  FLP 10/11 showed TChol 155, TG 81, HDL 40, LDL 99 ~  FLP 4/12 showed TChol 142, TG 69, HDL 38, LDL 90 ~  FLP 11/12 showed TChol 165, TG 82, HDL 43, LDL 106 ~  FLP 5/13 in Niasp500 showed TChol 155, TG 72, HDL 46, LDL 95... rec change to generic. ~  FLP 1/14 on Niacin500 showed TChol 140, TG 56, HDL 39, LDL 90 ~  FLP 1/15 on Niaspan500 showed TChol 148, TG 57, HDL 43, LDL 94  ~  FLP 1/16 on Niaspan500 showed TChol 150, TG 101, HDL 45, LDL 85  DM (ICD-250.00) - on diet + METFORMIN 500mg Bid... he reports BS at home all in the 120-130's... ~  labs 4/09 showed BS= 132, HgA1c= 6.6.Marland KitchenMarland Kitchen rec- same med, better diet... ~  labs 10/09 showed BS= 131, HgA1c= 6.3.Marland KitchenMarland Kitchen ~  labs 4/10 showed BS= 117, HgA1c= 6.2.[name]. rec> same meds/ diet Rx. ~  labs 10/10 showed BS= 138, A1c= 6.5 ~  labs 4/11 showed BS= 121, A1c= 6.5.Marland KitchenMarland Kitchen On Metform500Bid + diet. ~  Dilated eye exam 5/11 by DrShapiro- no retinopathy... ~  labs 10/11 (wt=228#) showed BS= 118, A1c= 7.1.[name]. not as good- get on diet or more meds. ~  Labs 4/12 (wt=229#) showed BS= 119, A1c= 7.3... Wrong direction, may need more meds, get wt down! ~  5/12:  Ophthalmology check by DrShapiro was neg- no retinopathy... ~  Labs 11/12 showed BS= 136, A1c= 7.0 ~  Labs 5/13 on MetformBid showed BS= 132, A1c= 7.1.Marland KitchenMarland Kitchen Continue same, get wt down. ~  Ophthalmology check from DrShapiro 5/13> no DM retinopathy... ~  Labs 1/14 showed BS= 134, A1c= 7.5; not as good- needs better diet, get wt down, same meds for now... ~  Labs 7/14 on  Metform500Bid showed BS= 140, A1c= 7.3  ~  Labs 1/154 on Metform500Bid showed BS= 129 & A1c= 7.0, and we reviewed meds/ diet/ exercise/ weight... ~  Labs 7/15 on Metform500Bid showed BS= 126, A1c= 7.1 & reminded of need for diet/ exerc/ wt reduction... ~  Labs 1/16 on Metform500Bid showed BS= 133, A1c= 7.2  GERD (ICD-530.81) & ?  GASTROPARESIS - on PROTONIX 40mg /d, & off Reglan per DrPerry... ~   last EGD 6/07 by DrPerry showed gastic polyp... ~  8/10:  given Compazine suppos for gastroparesis flair by DrGessner. ~  4/12:  C/o intermittent dysphagia & referred back to GI for further eval (colonoscopy due as well)...  DIVERTICULOSIS OF COLON (ICD-562.10) - he uses MIRALAX Bid regularly... IRRITABLE BOWEL SYNDROME (ICD-564.1) COLONIC POLYPS (ICD-211.3) ~  last colonoscopy 5/07 by DrSam showed divertics only... f/u 46yrs. ~  CT Abd 4/10 in ER showed atx bases, coronary calcif, s/p GB, abdAo calcif, 37mm renal stone, divertics, bilat hip AVN. ~  Follow up colonoscopy by DrPerry 5/12 showed severe diverticulosis, 2 sm polyps= tubular adenoma & f/u planned 5 yrs...  RENAL CALCULUS, HX OF (ICD-V13.01) BENIGN PROSTATIC HYPERTROPHY, HX OF (ICD-V13.8) - on FLOMAX 0.4mg /d w/ improved symptoms (he states flow better since stopping Vit E supplement). ~  4/12:  Routine PSA= 4.9 (it was 1.02 4/11) & we rx w/ Doxy Bid x14d, thewn plan recheck PSA==> 1.30  DEGENERATIVE JOINT DISEASE (ICD-715.90) - pt states "I have a bum knee" and eval by DrDuda w/ viscosupplementation shots... Note: Abd CT w/ bilat hip AVN noted... takes TYLENOL Prn... ~  11/10:  s/p left knee arthroscopy by DrDuda... ~  10/11:  s/p fall w/ left knee injury- s/p cortisone shot by DrDuda; hx shot in left shoulder too. ~  7/14: he is c/o incr pain in left hip area; known hx AVN & needs f/u by Ortho...  BACK PAIN, LUMBAR (ICD-724.2)  ANXIETY (ICD-300.00) - not currently on meds for nerves.  ANEMIA (ICD-285.9) - prev hx of GIB... 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  Health Maintenance: ~  GI: followed by DrPerry> Colonoscopy 5/07 by DrSam... f/u planned 5 yrs. ~  GU:  See PSAs recorded above... ~  Immunizations: Pneumovax in 2008 @ age 69;  [place] given 7/15; Tetanus- given 4/10;  he gets yearly seasonal Flu vaccine.   Past Surgical History  Procedure Laterality Date  . Cholecystectomy    . Cataract extraction    . Skin cancer excision  3/10    left hand   . Knee surgery  11/10    Outpatient Encounter Prescriptions as of 10/10/2014  Medication Sig  . aspirin 81 MG tablet Take 81 mg by mouth daily.    [name] atenolol (TENORMIN) 25 MG tablet TAKE 1 TABLET EVERY DAY  . atenolol (TENORMIN) 25 MG tablet TAKE 1 TABLET EVERY DAY  . Cholecalciferol (VITAMIN D) 2000 UNITS CAPS Take 1 capsule by mouth daily.    . COMPRO 25 MG suppository PLACE 1 SUPPOSITORY (25 MG TOTAL) RECTALLY EVERY 12 (TWELVE) HOURS AS NEEDED FOR NAUSEA.  [name] dextromethorphan-guaiFENesin (MUCINEX DM) 30-600 MG per 12 hr tablet Take 1 tablet by mouth every 12 (twelve) hours.  . fluticasone (FLONASE) 50 MCG/ACT nasal spray Place 2 sprays into both nostrils daily.  . fluticasone-salmeterol (ADVAIR HFA) 115-21 MCG/ACT inhaler Inhale 1 puff into the lungs 2 (two) times daily.  [name] glucose blood (ONE TOUCH ULTRA TEST) test strip Test blood sugar once daily as directed  . HYDROcodone-homatropine (HYDROMET) 5-1.5 MG/5ML  syrup Take 2.5 mLs by mouth at bedtime as needed for cough.  . hyoscyamine (LEVSIN SL) 0.125 MG SL tablet Place 1 tablet (0.125 mg total) under the tongue every 4 (four) hours as needed. As needed for abdominal cramping  . levofloxacin (LEVAQUIN) 500 MG tablet Take 1 tablet (500 mg total) by mouth  daily.  [name] levofloxacin (LEVAQUIN) 500 MG tablet Take 1 tablet (500 mg total) by mouth daily.  [name] loratadine (CLARITIN) 10 MG tablet Take 10 mg by mouth daily as needed for allergies.  [name] losartan (COZAAR) 100 MG tablet TAKE 1 TABLET BY MOUTH EVERY DAY  . metFORMIN (GLUCOPHAGE) 500 MG tablet TAKE 1 TABLET TWICE A DAY WITH A MEAL  . methylPREDNISolone (MEDROL DOSEPAK) 4 MG tablet follow package directions  . methylPREDNIsolone (MEDROL DOSPACK) 4 MG tablet follow package directions  . Multiple Vitamins-Minerals (MULTIVITAMIN WITH MINERALS) tablet Take 1 tablet by mouth daily.    . niacin (NIASPAN) 500 MG CR tablet Take 1 tablet (500 mg total) by mouth at bedtime.  . nitroGLYCERIN (NITROSTAT) 0.4 MG SL tablet Place 1 tablet (0.4 mg total) under the tongue every 5 (five) minutes as needed for chest pain.  . pantoprazole (PROTONIX) 40 MG tablet Take 1 tablet (40 mg total) by mouth daily.  . polyethylene glycol powder (GLYCOLAX/MIRALAX) powder TAKE 17 GRAMS BY MOUTH TWICE A DAY AS DIRECTED  . sodium chloride (OCEAN) 0.65 % nasal spray 1 spray by Nasal route as needed.    . tamsulosin (FLOMAX) 0.4 MG CAPS capsule TAKE ONE CAPSULE TWICE A DAY  . traMADol (ULTRAM) 50 MG tablet TAKE 1 TABLET THREE TIMES A DAY AS NEEDED FOR PAIN    Allergies  Allergen Reactions  . Prednisone     REACTION: high dose intolerance    Current Medications, Allergies, Past Medical History, Past Surgical History, Family History, and Social History were reviewed in [employer] record.    Review of Systems         See HPI - all other systems neg except as noted... The patient complains of decreased hearing, dyspnea on exertion, headaches, and difficulty walking.  The patient denies anorexia, fever, weight loss, weight gain, vision loss, hoarseness, chest pain, syncope, peripheral edema, prolonged cough, hemoptysis, abdominal pain, melena, hematochezia, severe indigestion/heartburn, hematuria,  incontinence, muscle weakness, suspicious skin lesions, transient blindness, depression, unusual weight change, abnormal bleeding, enlarged lymph nodes, and angioedema.     Objective:   Physical Exam      WD, WN, 79 y/o WM in NAD... GENERAL:  Alert & oriented; pleasant & cooperative... HEENT:  [place]/AT, EOM-full, PERRLA, EACs-clear, TMs-wnl, NOSE- pale, congested, THROAT-clear & wnl. NECK:  Supple w/ fairROM; no JVD; normal carotid impulses w/o bruits; no thyromegaly or nodules palpated; no lymphadenopathy. CHEST:  Clear to P & A except few bibasilar rhonchi noted; min exp wheezing, no signs of consolidation.[name] HEART:  Regular Rhythm; without murmurs/ rubs/ or gallops detected...  ABDOMEN:  Soft & nontender; normal bowel sounds; no organomegaly or masses palpated., no guarding, or rebound EXT: without deformities, mild arthritic changes; no varicose veins/ venous insuffic/ or edema. NEURO:  CN's intact;  no focal neuro deficits... DERM:  No lesions noted; no rash etc...  RADIOLOGY DATA:  Reviewed in the EPIC EMR & discussed w/ the patient...  LABORATORY DATA:  Reviewed in the EPIC EMR & discussed w/ the patient...   Assessment & Plan:    Acute Bronchitis >> presented 10/15 w/ refractory bronchitic episode; given ZPak=>Levaquin, Depo80/  Medrol dosepak, Mucinex, Hydromet, etc=> resolved... COPD exac>  He continues on Advair, Proair rescue, Mucinex; also Claritin, Flonase, Saline; stable continue same Rx... ~  7/15: given Prevnar-13  HBP>  On LOSARTAN 100mg /d & TIWP80 w/ good control of BP, continue same...  R/O CAD>  Followed by DrHochrein, and doing satis w/ neg Myoview 6/15... continue ASA & risk factor reduction....  Lipids>  Looks satis on his diet + Niacin...  DM>  Control is fair w/ A1c stable at 7.1; he was told there are more meds in his future if he doesn't get his wt down (currently~230#)...  GI>  Per DrPerry w/ GERD, ?Gastroparesis, Divertics, IBS, Colon polyps> f/u colon  5/12 w/ divertics & 2 sm adenomas, repeat planned ~77yrs...  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  Anxiety>  Certainly an issue, he does not want anxiolytic Rx...   Patient's Medications  New Prescriptions   No medications on file  Previous Medications   ASPIRIN 81 MG TABLET    Take 81 mg by mouth daily.     ATENOLOL (TENORMIN) 25 MG TABLET    TAKE 1 TABLET EVERY DAY   CHOLECALCIFEROL (VITAMIN D) 2000 UNITS CAPS    Take 1 capsule by mouth daily.     COMPRO 25 MG SUPPOSITORY    PLACE 1 SUPPOSITORY (25 MG TOTAL) RECTALLY EVERY 12 (TWELVE) HOURS AS NEEDED FOR NAUSEA.   DEXTROMETHORPHAN-GUAIFENESIN (MUCINEX DM) 30-600 MG PER 12 HR TABLET    Take 1 tablet by mouth every 12 (twelve) hours.   GLUCOSE BLOOD (ONE TOUCH ULTRA TEST) TEST STRIP    Test blood sugar once daily as directed   LORATADINE (CLARITIN) 10 MG TABLET    Take 10 mg by mouth daily as needed for allergies.   LOSARTAN (COZAAR) 100 MG TABLET    TAKE 1 TABLET BY MOUTH EVERY DAY   METFORMIN (GLUCOPHAGE) 500 MG TABLET    TAKE 1 TABLET TWICE A DAY WITH A MEAL   MULTIPLE VITAMINS-MINERALS (MULTIVITAMIN WITH MINERALS) TABLET    Take 1 tablet by mouth daily.     NITROGLYCERIN (NITROSTAT) 0.4 MG SL TABLET    Place 1 tablet (0.4 mg total) under the tongue every 5 (five) minutes as needed for chest pain.   POLYETHYLENE GLYCOL POWDER (GLYCOLAX/MIRALAX) POWDER    TAKE 17 GRAMS BY MOUTH TWICE A DAY AS DIRECTED   SODIUM CHLORIDE (OCEAN) 0.65 % NASAL SPRAY    1 spray by Nasal route as needed.     TRAMADOL (ULTRAM) 50 MG TABLET    TAKE 1 TABLET THREE TIMES A DAY AS NEEDED FOR PAIN  Modified Medications   Modified Medication Previous Medication   FLUTICASONE (FLONASE) 50 MCG/ACT NASAL SPRAY fluticasone (FLONASE) 50 MCG/ACT nasal spray      Place 2 sprays into both nostrils daily.    Place 2 sprays into both nostrils daily.   FLUTICASONE-SALMETEROL (ADVAIR HFA) 115-21 MCG/ACT INHALER  fluticasone-salmeterol (ADVAIR HFA) 115-21 MCG/ACT inhaler      Inhale 1 puff into the lungs 2 (two) times daily.    Inhale 1 puff into the lungs 2 (two) times daily.   HYOSCYAMINE (LEVSIN SL) 0.125 MG SL TABLET hyoscyamine (LEVSIN SL) 0.125 MG SL tablet      Place 1 tablet (0.125 mg total) under the tongue every 4 (four) hours as needed. As needed for abdominal cramping    Place 1 tablet (0.125 mg total) under the tongue  every 4 (four) hours as needed. As needed for abdominal cramping   NIACIN (NIASPAN) 500 MG CR TABLET niacin (NIASPAN) 500 MG CR tablet      Take 1 tablet (500 mg total) by mouth at bedtime.    Take 1 tablet (500 mg total) by mouth at bedtime.   PANTOPRAZOLE (PROTONIX) 40 MG TABLET pantoprazole (PROTONIX) 40 MG tablet      Take 1 tablet (40 mg total) by mouth daily.    Take 1 tablet (40 mg total) by mouth daily.   TAMSULOSIN (FLOMAX) 0.4 MG CAPS CAPSULE tamsulosin (FLOMAX) 0.4 MG CAPS capsule      TAKE ONE CAPSULE TWICE A DAY    TAKE ONE CAPSULE TWICE A DAY  Discontinued Medications   ATENOLOL (TENORMIN) 25 MG TABLET    TAKE 1 TABLET EVERY DAY   HYDROCODONE-HOMATROPINE (HYDROMET) 5-1.5 MG/5ML SYRUP    Take 2.5 mLs by mouth at bedtime as needed for cough.   LEVOFLOXACIN (LEVAQUIN) 500 MG TABLET    Take 1 tablet (500 mg total) by mouth daily.   LEVOFLOXACIN (LEVAQUIN) 500 MG TABLET    Take 1 tablet (500 mg total) by mouth daily.   METHYLPREDNISOLONE (MEDROL DOSEPAK) 4 MG TABLET    follow package directions   METHYLPREDNISOLONE (MEDROL DOSPACK) 4 MG TABLET    follow package directions

## 2014-10-11 LAB — LIPID PANEL
Cholesterol: 150 mg/dL (ref 0–200)
HDL: 44.7 mg/dL (ref >39.00)
LDL Cholesterol: 85 mg/dL (ref 0–99)
NonHDL: 105.3
Total CHOL/HDL Ratio: 3
Triglycerides: 101 mg/dL (ref 0.0–149.0)
VLDL: 20.2 mg/dL (ref 0.0–40.0)

## 2014-10-11 LAB — HEPATIC FUNCTION PANEL
ALBUMIN: 4.2 g/dL (ref 3.5–5.2)
ALT: 15 U/L (ref 0–53)
AST: 21 U/L (ref 0–37)
Alkaline Phosphatase: 79 U/L (ref 39–117)
Bilirubin, Direct: 0.1 mg/dL (ref 0.0–0.3)
Total Bilirubin: 0.5 mg/dL (ref 0.2–1.2)
Total Protein: 7 g/dL (ref 6.0–8.3)

## 2014-10-11 LAB — BASIC METABOLIC PANEL
BUN: 19 mg/dL (ref 6–23)
CHLORIDE: 106 meq/L (ref 96–112)
CO2: 23 mEq/L (ref 19–32)
Calcium: 9.8 mg/dL (ref 8.4–10.5)
Creatinine, Ser: 0.92 mg/dL (ref 0.40–1.50)
GFR: 84.77 mL/min (ref >60.00)
Glucose, Bld: 133 mg/dL — ABNORMAL HIGH (ref 70–99)
Potassium: 4.8 mEq/L (ref 3.5–5.1)
Sodium: 141 mEq/L (ref 135–145)

## 2014-12-20 ENCOUNTER — Telehealth: Payer: Self-pay | Admitting: Internal Medicine

## 2014-12-20 NOTE — Telephone Encounter (Signed)
Pt states he has been having pain in his RLQ of abdomen that comes and goes. States he has been taking the levsin and it helps some but pt is concerned and would like to have this checked out. Pt scheduled to see Tye Savoy NP 12/26/14@1 :30pm. Pt aware of appt.

## 2014-12-24 ENCOUNTER — Encounter: Payer: Self-pay | Admitting: *Deleted

## 2014-12-26 ENCOUNTER — Ambulatory Visit (INDEPENDENT_AMBULATORY_CARE_PROVIDER_SITE_OTHER): Payer: Medicare Other | Admitting: Nurse Practitioner

## 2014-12-26 ENCOUNTER — Encounter: Payer: Self-pay | Admitting: Nurse Practitioner

## 2014-12-26 ENCOUNTER — Other Ambulatory Visit (INDEPENDENT_AMBULATORY_CARE_PROVIDER_SITE_OTHER): Payer: Medicare Other

## 2014-12-26 VITALS — BP 162/68 | HR 76 | Ht 74.0 in | Wt 226.0 lb

## 2014-12-26 DIAGNOSIS — R1031 Right lower quadrant pain: Secondary | ICD-10-CM

## 2014-12-26 DIAGNOSIS — K219 Gastro-esophageal reflux disease without esophagitis: Secondary | ICD-10-CM

## 2014-12-26 DIAGNOSIS — D126 Benign neoplasm of colon, unspecified: Secondary | ICD-10-CM

## 2014-12-26 DIAGNOSIS — I2510 Atherosclerotic heart disease of native coronary artery without angina pectoris: Secondary | ICD-10-CM

## 2014-12-26 DIAGNOSIS — R1010 Upper abdominal pain, unspecified: Secondary | ICD-10-CM | POA: Diagnosis not present

## 2014-12-26 DIAGNOSIS — Z86010 Personal history of colonic polyps: Secondary | ICD-10-CM | POA: Diagnosis not present

## 2014-12-26 LAB — CBC WITH DIFFERENTIAL/PLATELET
Basophils Absolute: 0 10*3/uL (ref 0.0–0.1)
Basophils Relative: 0.6 % (ref 0.0–3.0)
EOS ABS: 0.1 10*3/uL (ref 0.0–0.7)
Eosinophils Relative: 1.9 % (ref 0.0–5.0)
HCT: 40.2 % (ref 39.0–52.0)
Hemoglobin: 13.6 g/dL (ref 13.0–17.0)
Lymphocytes Relative: 23.5 % (ref 12.0–46.0)
Lymphs Abs: 1.5 10*3/uL (ref 0.7–4.0)
MCHC: 33.8 g/dL (ref 30.0–36.0)
MCV: 87.4 fl (ref 78.0–100.0)
Monocytes Absolute: 0.4 10*3/uL (ref 0.1–1.0)
Monocytes Relative: 6.7 % (ref 3.0–12.0)
NEUTROS PCT: 67.3 % (ref 43.0–77.0)
Neutro Abs: 4.3 10*3/uL (ref 1.4–7.7)
Platelets: 200 10*3/uL (ref 150.0–400.0)
RBC: 4.6 Mil/uL (ref 4.22–5.81)
RDW: 13.2 % (ref 11.5–15.5)
WBC: 6.4 10*3/uL (ref 4.0–10.5)

## 2014-12-26 LAB — COMPREHENSIVE METABOLIC PANEL
ALT: 14 U/L (ref 0–53)
AST: 17 U/L (ref 0–37)
Albumin: 4.1 g/dL (ref 3.5–5.2)
Alkaline Phosphatase: 88 U/L (ref 39–117)
BUN: 16 mg/dL (ref 6–23)
CO2: 26 meq/L (ref 19–32)
CREATININE: 0.84 mg/dL (ref 0.40–1.50)
Calcium: 9.7 mg/dL (ref 8.4–10.5)
Chloride: 104 mEq/L (ref 96–112)
GFR: 94.11 mL/min (ref >60.00)
Glucose, Bld: 199 mg/dL — ABNORMAL HIGH (ref 70–99)
Potassium: 4.5 mEq/L (ref 3.5–5.1)
SODIUM: 137 meq/L (ref 135–145)
TOTAL PROTEIN: 7.2 g/dL (ref 6.0–8.3)
Total Bilirubin: 0.3 mg/dL (ref 0.2–1.2)

## 2014-12-26 LAB — LIPASE: Lipase: 21 U/L (ref 11.0–59.0)

## 2014-12-26 LAB — AMYLASE: AMYLASE: 25 U/L — AB (ref 27–131)

## 2014-12-26 MED ORDER — SUCRALFATE 1 GM/10ML PO SUSP: ORAL | Status: DC

## 2014-12-26 NOTE — Patient Instructions (Addendum)
Please go to the basement level to have your labs drawn.  We sent a prescription for Carafate suspension, to [facility] [address]    You have been scheduled for a CT scan of the abdomen and pelvis at [facility] ([address]---this is in the same building as [facility]).   You are scheduled on   Thursday 12-27-2014 at 2:00 PM . You should arrive at 1:45 PM  prior to your appointment time for registration. Please follow the written instructions below on the day of your exam:  WARNING: IF YOU ARE ALLERGIC TO IODINE/X-RAY DYE, PLEASE NOTIFY RADIOLOGY IMMEDIATELY AT [phone]! YOU WILL BE GIVEN A 13 HOUR PREMEDICATION PREP.  1) Do not eat or drink anything after 10:00 am  (4 hours prior to your test) 2) You have been given 2 bottles of oral contrast to drink. The solution may taste better if refrigerated, but do NOT add ice or any other liquid to this solution. Shake well before drinking.    Drink 1 bottle of contrast @  12:00 Noon (2 hours prior to your exam)  Drink 1 bottle of contrast @ 1:00 PM  (1 hour prior to your exam)  You may take any medications as prescribed with a small amount of water except for the following: Metformin, Glucophage, Glucovance, Avandamet, Riomet, Fortamet, Actoplus Met, Janumet, Glumetza or Metaglip. The above medications must be held the day of the exam AND 48 hours after the exam.  The purpose of you drinking the oral contrast is to aid in the visualization of your intestinal tract. The contrast solution may cause some diarrhea. Before your exam is started, you will be given a small amount of fluid to drink. Depending on your individual set of symptoms, you may also receive an intravenous injection of x-ray contrast/dye. Plan on being at [facility] for 30 minutes or long, depending on the type of exam you are having performed.  If you have any questions regarding your exam or if you need to reschedule, you may call the CT department at  [phone] between the hours of 8:00 am and 5:00 pm, Monday-Friday.  ________________________________________________________________________  

## 2014-12-26 NOTE — Progress Notes (Signed)
HPI :   Patient is a 79 year old male with multiple medical problems. He is known to Dr. Henrene Pastor for a history of GERD, a large gastric polyp and also adenomatous colon polyps in 2012.  Patient comes in today for evaluation of abdominal pain. Since July he has had intermittent right lower quadrant pain described as a dull ache. Pain is not really affected by movement.  It is unrelated to eating or bowel movements. No urinary symptoms.  Patient does not know what triggers the pain. Patient has not mentioned the pain to PCP but given its persistence he is now worried about potential causes. He has had chronic constipation since childhood but has taken MiraLAX twice daily for 3 years and that keeps bowels regular. No unusual weight loss, patient appears quite healthy. He has no urinary symptoms. He does have chronic back problems and states that his right hip needs to be replaced some point   Patient also reports a one-week history of upper abdominal discomfort. Discomfort most noticeable on an empty stomach, eating helps. No nausea or vomiting. No fevers. Patient has been taking Hyoscyamine which has been helpful for both the right lower quadrant and upper abdominal pain. He takes a baby aspirin, no other NSAIDs. He is on a daily PPI.     Past Medical History  Diagnosis Date  . Hearing loss   . Allergic rhinitis, cause unspecified   . Obstructive sleep apnea (adult) (pediatric)   . COPD (chronic obstructive pulmonary disease)   . Unspecified essential hypertension   . Mixed hyperlipidemia   . Coronary artery disease     Coronary calcification with negative ETT 2010;   ETT-Myoview (02/2014):  Normal stress nuclear study. No evidence of ischemia.  LV Ejection Fraction: 69%  . Type II or unspecified type diabetes mellitus without mention of complication, not stated as uncontrolled   . Esophageal reflux   . Diverticulosis of colon (without mention of hemorrhage)   . Irritable bowel syndrome   .  Benign neoplasm of colon   . Renal calculus   . BPH (benign prostatic hypertrophy)   . Degenerative joint disease   . Lumbar back pain   . Anxiety   . Anemia   . Hyperplastic colon polyp 02/12/2011    Family History  Problem Relation Age of Onset  . Heart disease Father 16  . Hypertension Brother   . Stroke Mother 70  . Diabetes Mother   . Colon cancer Neg Hx    History  Substance Use Topics  . Smoking status: Former Smoker -- 1.00 packs/day for 25 years    Types: Cigarettes    Quit date: 09/15/1979  . Smokeless tobacco: Former Systems developer    Quit date: 09/15/1979  . Alcohol Use: No   Current Outpatient Prescriptions  Medication Sig Dispense Refill  . aspirin 81 MG tablet Take 81 mg by mouth daily.      [name] atenolol (TENORMIN) 25 MG tablet TAKE 1 TABLET EVERY DAY 30 tablet 11  . Cholecalciferol (VITAMIN D) 2000 UNITS CAPS Take 1 capsule by mouth daily.      . COMPRO 25 MG suppository PLACE 1 SUPPOSITORY (25 MG TOTAL) RECTALLY EVERY 12 (TWELVE) HOURS AS NEEDED FOR NAUSEA. 12 suppository 0  . dextromethorphan-guaiFENesin (MUCINEX DM) 30-600 MG per 12 hr tablet Take 1 tablet by mouth every 12 (twelve) hours.    . fluticasone (FLONASE) 50 MCG/ACT nasal spray Place 2 sprays into both nostrils daily. 16 g 11  . fluticasone-salmeterol (  ADVAIR HFA) 115-21 MCG/ACT inhaler Inhale 1 puff into the lungs 2 (two) times daily. 1 Inhaler 11  . glucose blood (ONE TOUCH ULTRA TEST) test strip Test blood sugar once daily as directed 100 each 6  . hyoscyamine (LEVSIN SL) 0.125 MG SL tablet Place 1 tablet (0.125 mg total) under the tongue every 4 (four) hours as needed. As needed for abdominal cramping 90 tablet 5  . loratadine (CLARITIN) 10 MG tablet Take 10 mg by mouth daily as needed for allergies.    [name] losartan (COZAAR) 100 MG tablet TAKE 1 TABLET BY MOUTH EVERY DAY 90 tablet 1  . metFORMIN (GLUCOPHAGE) 500 MG tablet TAKE 1 TABLET TWICE A DAY WITH A MEAL 60 tablet 11  . Multiple Vitamins-Minerals  (MULTIVITAMIN WITH MINERALS) tablet Take 1 tablet by mouth daily.      . niacin (NIASPAN) 500 MG CR tablet Take 1 tablet (500 mg total) by mouth at bedtime. 30 tablet 11  . nitroGLYCERIN (NITROSTAT) 0.4 MG SL tablet Place 1 tablet (0.4 mg total) under the tongue every 5 (five) minutes as needed for chest pain. 30 tablet 11  . pantoprazole (PROTONIX) 40 MG tablet Take 1 tablet (40 mg total) by mouth daily. 30 tablet 11  . polyethylene glycol powder (GLYCOLAX/MIRALAX) powder TAKE 17 GRAMS BY MOUTH TWICE A DAY AS DIRECTED 1054 g 11  . Probiotic Product (PROBIOTIC DAILY PO) Take by mouth daily.    . sodium chloride (OCEAN) 0.65 % nasal spray 1 spray by Nasal route as needed.      . tamsulosin (FLOMAX) 0.4 MG CAPS capsule TAKE ONE CAPSULE TWICE A DAY 60 capsule 5  . traMADol (ULTRAM) 50 MG tablet TAKE 1 TABLET THREE TIMES A DAY AS NEEDED FOR PAIN 90 tablet 5   No current facility-administered medications for this visit.   Allergies  Allergen Reactions  . Prednisone     REACTION: high dose intolerance    Review of Systems: Positive for allergy/sinus, blood in urine, fatigue, shortness of breath and frequent urination. All other systems reviewed and negative except where noted in HPI.   Physical Exam: BP 162/68 mmHg  Pulse 76  Ht 6\' 2"  (1.88 m)  Wt 226 lb (102.513 kg)  BMI 29.00 kg/m2 Constitutional: Pleasant, well-developed, white male in no acute distress. HEENT: Normocephalic and atraumatic. Conjunctivae are normal. No scleral icterus. Neck supple.  Cardiovascular: Normal rate, regular rhythm.  Pulmonary/chest: Effort normal and breath sounds normal. No wheezing, rales or rhonchi. Abdominal: Soft, nondistended, nontender. Bowel sounds active throughout. There are no masses palpable. No hepatomegaly. Extremities: 1+ BLE edema Lymphadenopathy: No cervical adenopathy noted. Neurological: Alert and oriented to person place and time. Skin: Skin is warm and dry. No rashes  noted. Psychiatric: Normal mood and affect. Behavior is normal.   ASSESSMENT AND PLAN:  1. Very pleasant 79 year old male with a several month history of intermittent right lower quadrant pain of unclear etiology. No right lower quadrant masses appreciated on exam. No urinary symptoms. Could be musculoskeletal pain.  He has also started having upper abdominal discomfort which is probably unrelated. Continue PPI, trial of Carafate. Patient interested in pursuing workup of his right lower quadrant pain, he is really worried. Will schedule him for CTscan of abd / pelvis with contrast.   2. Hx of adenomatous colon polyps. Last surveillance colonoscopy May 2012. Findings included severe left-sided diverticulosis and 2 small polyps, one of which was adenomatous. Patient would be due for surveillance colonoscopy but given advanced age and  severe diverticular disease (increasing risk of procedure related complications) I do not recommend continuing its colonoscopies. The risk of colonoscopy in his case may outweigh the benefits and patient agrees with this

## 2014-12-27 ENCOUNTER — Ambulatory Visit (INDEPENDENT_AMBULATORY_CARE_PROVIDER_SITE_OTHER)
Admission: RE | Admit: 2014-12-27 | Discharge: 2014-12-27 | Disposition: A | Discharge status: Home / Self Care | Payer: Medicare Other | Source: Ambulatory Visit | Attending: Nurse Practitioner | Admitting: Nurse Practitioner

## 2014-12-27 ENCOUNTER — Encounter: Payer: Self-pay | Admitting: Nurse Practitioner

## 2014-12-27 DIAGNOSIS — R1010 Upper abdominal pain, unspecified: Secondary | ICD-10-CM

## 2014-12-27 DIAGNOSIS — D126 Benign neoplasm of colon, unspecified: Secondary | ICD-10-CM | POA: Diagnosis not present

## 2014-12-27 DIAGNOSIS — N200 Calculus of kidney: Secondary | ICD-10-CM | POA: Diagnosis not present

## 2014-12-27 DIAGNOSIS — K219 Gastro-esophageal reflux disease without esophagitis: Secondary | ICD-10-CM

## 2014-12-27 DIAGNOSIS — R1031 Right lower quadrant pain: Secondary | ICD-10-CM

## 2014-12-27 DIAGNOSIS — K5730 Diverticulosis of large intestine without perforation or abscess without bleeding: Secondary | ICD-10-CM | POA: Diagnosis not present

## 2014-12-27 MED ORDER — IOHEXOL 300 MG/ML  SOLN
100.0000 mL | Freq: Once | INTRAMUSCULAR | Status: AC | PRN
  Administered 2014-12-27: 100 mL via INTRAVENOUS

## 2014-12-28 DIAGNOSIS — Z86010 Personal history of colonic polyps: Secondary | ICD-10-CM | POA: Insufficient documentation

## 2014-12-28 NOTE — Addendum Note (Signed)
Addended by: Willia Craze on: 12/28/2014 11:42 AM   Modules accepted: Level of Service

## 2015-01-01 NOTE — Progress Notes (Signed)
Case reviewed with advanced practitioner. Agree with assessment and plans as outlined

## 2015-02-08 DIAGNOSIS — M7542 Impingement syndrome of left shoulder: Secondary | ICD-10-CM | POA: Diagnosis not present

## 2015-02-08 DIAGNOSIS — M25551 Pain in right hip: Secondary | ICD-10-CM | POA: Diagnosis not present

## 2015-02-12 ENCOUNTER — Other Ambulatory Visit: Payer: Self-pay | Admitting: Pulmonary Disease

## 2015-03-11 ENCOUNTER — Other Ambulatory Visit: Payer: Self-pay

## 2015-03-12 DIAGNOSIS — L821 Other seborrheic keratosis: Secondary | ICD-10-CM | POA: Diagnosis not present

## 2015-03-12 DIAGNOSIS — D1801 Hemangioma of skin and subcutaneous tissue: Secondary | ICD-10-CM | POA: Diagnosis not present

## 2015-03-12 DIAGNOSIS — L814 Other melanin hyperpigmentation: Secondary | ICD-10-CM | POA: Diagnosis not present

## 2015-03-12 DIAGNOSIS — D225 Melanocytic nevi of trunk: Secondary | ICD-10-CM | POA: Diagnosis not present

## 2015-03-12 DIAGNOSIS — L820 Inflamed seborrheic keratosis: Secondary | ICD-10-CM | POA: Diagnosis not present

## 2015-03-12 DIAGNOSIS — L570 Actinic keratosis: Secondary | ICD-10-CM | POA: Diagnosis not present

## 2015-03-24 ENCOUNTER — Other Ambulatory Visit: Payer: Self-pay | Admitting: Pulmonary Disease

## 2015-04-10 ENCOUNTER — Other Ambulatory Visit (INDEPENDENT_AMBULATORY_CARE_PROVIDER_SITE_OTHER): Payer: Medicare Other

## 2015-04-10 ENCOUNTER — Ambulatory Visit (INDEPENDENT_AMBULATORY_CARE_PROVIDER_SITE_OTHER): Payer: Medicare Other | Admitting: Pulmonary Disease

## 2015-04-10 ENCOUNTER — Encounter: Payer: Self-pay | Admitting: Pulmonary Disease

## 2015-04-10 VITALS — BP 120/70 | HR 72 | Temp 97.0°F | Ht 73.0 in | Wt 221.8 lb

## 2015-04-10 DIAGNOSIS — M545 Low back pain, unspecified: Secondary | ICD-10-CM

## 2015-04-10 DIAGNOSIS — I10 Essential (primary) hypertension: Secondary | ICD-10-CM

## 2015-04-10 DIAGNOSIS — F411 Generalized anxiety disorder: Secondary | ICD-10-CM

## 2015-04-10 DIAGNOSIS — E119 Type 2 diabetes mellitus without complications: Secondary | ICD-10-CM

## 2015-04-10 DIAGNOSIS — J4489 Other specified chronic obstructive pulmonary disease: Secondary | ICD-10-CM

## 2015-04-10 DIAGNOSIS — E782 Mixed hyperlipidemia: Secondary | ICD-10-CM | POA: Diagnosis not present

## 2015-04-10 DIAGNOSIS — I2510 Atherosclerotic heart disease of native coronary artery without angina pectoris: Secondary | ICD-10-CM

## 2015-04-10 DIAGNOSIS — K3184 Gastroparesis: Secondary | ICD-10-CM

## 2015-04-10 DIAGNOSIS — M159 Polyosteoarthritis, unspecified: Secondary | ICD-10-CM

## 2015-04-10 DIAGNOSIS — N401 Enlarged prostate with lower urinary tract symptoms: Secondary | ICD-10-CM

## 2015-04-10 DIAGNOSIS — M150 Primary generalized (osteo)arthritis: Secondary | ICD-10-CM

## 2015-04-10 DIAGNOSIS — J449 Chronic obstructive pulmonary disease, unspecified: Secondary | ICD-10-CM

## 2015-04-10 DIAGNOSIS — N138 Other obstructive and reflux uropathy: Secondary | ICD-10-CM

## 2015-04-10 LAB — BASIC METABOLIC PANEL
BUN: 15 mg/dL (ref 6–23)
CHLORIDE: 107 meq/L (ref 96–112)
CO2: 27 meq/L (ref 19–32)
CREATININE: 0.83 mg/dL (ref 0.40–1.50)
Calcium: 9.8 mg/dL (ref 8.4–10.5)
GFR: 95.34 mL/min (ref >60.00)
Glucose, Bld: 129 mg/dL — ABNORMAL HIGH (ref 70–99)
Potassium: 5 mEq/L (ref 3.5–5.1)
Sodium: 140 mEq/L (ref 135–145)

## 2015-04-10 LAB — HEMOGLOBIN A1C: HEMOGLOBIN A1C: 6.7 % — AB (ref 4.6–6.5)

## 2015-04-10 MED ORDER — METFORMIN HCL 500 MG PO TABS: ORAL_TABLET | ORAL | Status: DC

## 2015-04-10 MED ORDER — GLUCOSE BLOOD VI STRP: ORAL_STRIP | Status: DC

## 2015-04-10 MED ORDER — ATENOLOL 25 MG PO TABS: 25.0000 mg | ORAL_TABLET | Freq: Every day | ORAL | Status: DC

## 2015-04-10 MED ORDER — TRAMADOL HCL 50 MG PO TABS: ORAL_TABLET | ORAL | Status: DC

## 2015-04-10 MED ORDER — POLYETHYLENE GLYCOL 3350 17 GM/SCOOP PO POWD: ORAL | Status: DC

## 2015-04-10 MED ORDER — LOSARTAN POTASSIUM 100 MG PO TABS: 100.0000 mg | ORAL_TABLET | Freq: Every day | ORAL | Status: DC

## 2015-04-10 NOTE — Progress Notes (Signed)
Subjective:    Patient ID: JOAHAN SWATZELL, male    DOB: 01-Jan-1936, 79 y.o.   MRN: [id number]  HPI 79 y/o WM w/ mult med problems here for a follow up visit>  ~  SEE PREV EOPIC NOTES FOR OLDER DATA >>    LABS 1/14:  FLP- at goals on Niacin rx;  Chems- ok w/ BS=134, A1c=7.5;  CBC- wnl;  TSH=1.13;  VitD=42;  PSA=1.10      LABS 7/14:  Chems- ok x BS=140, A1c=7.3...   1/15> [name] reports that DrDuda plans a left THR (for avasc necreosis) whenever he is ready, but he's not rushing it, on Tramadol prn & doing satis for now...   LABS 1/15:  FLP- at goals on diet+Niaspan;  Chesm- ok w/ BS=129, A1c=7.0;  CBC- wnl;  TSH=1.06...  ~  April 05, 2014:  68mo ROV & reports a good interval, no new complaints or concerns- notes mild intermit LTOS, wants B12 level checked (on Metformin), wants Prevnar-13 shot... We reviewed the following medical problems during today's office visit >>     HOH> he has bilat hearing aides...    AR> on allergy shots, OTC antihist, Saline, Flonase; gets occas bouts of sinusitis but overall improved, had ZPak & Levaquin in May from TP=> resolved...    OSA> Sleep Study was in 2/04 w/ RDI=45 & desat to 88% w/ mod snoring & leg jerks, but he states he never could use the CPAP effectively & states he sleeps fine if he stays on his side; denies daytime sleepiness or other issues...    COPD> exsmoker quit 1981; on AdvairHFA115-2spBid, Mucinex prn; he doesn't like Pred Rx; he had Bronchitic exac treated by TP 5/15 w/ ZPak, then Levaquin, Mucinex, etc & improved; needs incr exercise program...    HBP> on Aten25, Losar100; BP=124/70, he denies CP, palpit, dizzy, SOB, edema, etc...    CAD> on ASA81; followed by DrHochrein- CT Abd 2010 showed coronary calcif, Treadmill was neg; Myoview 3/13 was neg, wnl; last seen 4/15- no new symptoms, active w/ yard work, EKG showed wnl, they decided to do Treadmill (borderline ST depression), then Myoview 6/15 showed NEG- no ischemia, decr uptake inferiorly  same as 2013, EF=69%, norm wall motion, low risk...    Hyperlipid> on Niacin500mg ; FLP 1/15 showed TChol 148, TG 57, HDL 43, LDL 94; continue same, get wt down...    DM> on Metform500Bid; wt=224#, BMI=29; Labs 7/15 showed BS=126, A1c=7.1; rec to continue same med, better diet, get wt down!..    GI- GERD, dysphagia, Divertics, IBS, Polyps> on Protonix40, Miralax, Levsin0.125 prn; followed by DrPerry & stable- last colon 5/12 w/ severe divertics & 2 sm adenomas removed...    GU- Kid stone, BPH> on Flomax0.4 & Cialis prn; PSA remains wnl...    DJD, LBP> left knee complaints w/ shots from DrDuda, similar for left shoulder; hx bilat hip AVN noted on prev CT Abd but he has min complaints and uses Tylenol prn (holding off on surg)...    Anxiety> he does not want anxiolytic meds.. We reviewed prob list, meds, xrays and labs> see below for updates >>   EKG 4/15 showed NSR, rate70, wnl, NAD...  Treadmill 6/15 (DrBrackbill) showed borderline ST segment changes w/ exercise & a run of atrial tachy reported...   Myoview 6/15 showed good exerc capacity, norm BP response, no CP, no evid ischemia, decr uptake inferiorly on rest images similar to 2013, EF=69%, norm wall motion...  LABS 7/15:  Chems- ok w/ BS=126, A1c=7.1.Marland KitchenMarland Kitchen  rec to continue Metform500Bid & get on diet, incr exerc, get wt down!!!  ~  June 26, 2014:  11mo ROV & [name] called for add-on appt due to refractory bronchitic infection; he saw TP 9/28 c/o HA, head congestion, drainage, sore throat & cough w/ clear sput; given ZPak, Mucinex, Fluids, Hydromet but he notes symptoms continued & this week the sput turned yellow green assoc w/ aching in the chest, chest tightness, chills & sweats; he denies CP or dyspnea... Exam shows currently afeb, stable VS, chest w/ scat rhonchi but no rales or signs of consolidation...  We reviewed his labs & XRays in Epic and he requests Medrol & stronger antibiotic for his symptoms... We discussed Rx w/ Depo80, Medrol  dosepak, Levaquin500 x7d, and continue the Mucinex/ fluids/ Hydromet prn/ & Tylenol prn...   ~  October 10, 2014:  82mo ROV & [name] indicates it took several wks to get over his URI/AB episode in Oct;      On Advair115-2spBid, Mucinex, flonase, claritin; he reports breathing is stable- min cough, no sput, stable DOE, no edema...     He remains on Niasp500/d; FLP 1/16 showed TChol 150, TG 101, HDL 45, LDL 85    He continues Metform500Bid for his DM; Labs 1/16 showed BS=133, A1c=7.2    He has some lower abd pain/ cramping & has used the Levsin w/ relief- c/w IBS, some diarrhea, nausea, groin discomfort; he had colonoscopy 2012 w/ 2 polyps removed & f/u planned 2017...     He sees DrDuda for Ortho & takes Tramadol50 aS NEEDED... We reviewed prob list, meds, xrays and labs> see below for updates >>   CXR 1/16 showed norm heart size, clear lungs, DJD in Tspine, NAD...  LABS 1/16:  FLP- at goals on Niasp;  Chems- ok w/ BS=133 A1c=7.2;  CBC- wnl;  TSH=1.59;  PSA=1.25...   ~  April 10, 2015:  82mo ROV & [name] reports stable w/ CC= arthritis pain in shoulders/ hips, he sees DrDuda, told he needs left THR but he is holding off, Tramadol helps;  We reviewed the following medical problems during today's office visit >>    HOH> he has bilat hearing aides...    AR> on allergy shots, OTC antihist, Saline, Flonase; gets occas bouts of sinusitis but overall improved & no recent infections reported...     OSA> Sleep Study was in 2/04 w/ RDI=45 & desat to 88% w/ mod snoring & leg jerks, but he states he never could use the CPAP effectively & states he sleeps fine now if he stays on his side; denies daytime sleepiness or other issues...    COPD> exsmoker quit 1981; on AdvairHFA115-2spBid, Mucinex prn; he doesn't like Pred Rx; he had Bronchitic exac treated 10/15 w/ Levaquin, Medrol, Mucinex, etc & improved; needs incr exercise program...    HBP> on Aten25, Losar100; BP=120/70, he denies CP, palpit, dizzy, SOB, edema,  etc...    CAD> on ASA81; followed by DrHochrein- CT Abd 2010 showed coronary calcif, Treadmill was neg; Myoview 3/13 was neg, wnl; last seen 4/15- no new symptoms, active w/ yard work, EKG showed wnl, they decided to do Treadmill (borderline ST depression), then Myoview 6/15 showed NEG- no ischemia, decr uptake inferiorly same as 2013, EF=69%, norm wall motion, low risk...    Hyperlipid> on Niacin500mg ; FLP 1/16 showed TChol 150, TG 101, HDL 45, LDL 85; continue same, get wt down...    DM> on Metform500Bid; wt=222#, BMI=29; Labs 7/16 showed BS=129, A1c=6.7; rec to  continue same med, better diet, get wt down!..    GI- GERD, dysphagia, Divertics, IBS, Polyps> on Protonix40, Miralax, Levsin0.125 prn; followed by DrPerry & stable- last colon 5/12 w/ severe divertics & 2 sm adenomas removed...    GU- Kid stone, BPH> on Flomax0.4 & Cialis prn; PSA remains wnl (PSA 1/16= 1.25)...    DJD, LBP> left knee complaints w/ shots from DrDuda, similar for left shoulder; hx bilat hip AVN noted on prev CT Abd but he has min complaints and uses Tylenol prn (holding off on surg)...    Anxiety> he does not want anxiolytic meds.[name] EXAM shows Afeb, VSS, O2sat=97%;  Heent- neg, mallampati2;  Chest- clear w/o w/r/r;  Heart- RR w/o m/r/g;  Abd- soft, neg, nontender;  Ext- neg w/o c/c/e;  Neuro- intact...  Labs 7/16>  Chems- ok w/ BS=129, A1c=6.7, Cr=0.83... IMP/PLAN>>  [name] is stable, meds refilled, he's already had the 2016 Flu vaccine & up to date;  We discussed diet/ exercise/ wt reduction... ROV in 46mo.         Problem List:  HEARING LOSS (ICD-389.9) - he has bilat hearing aides...  ALLERGIC RHINITIS (ICD-477.9) - on allergy shots from DrESL Gaetano Hawthorne)... plus Claritin, Saline, Mucinex, Flonase. ~  occas bouts of sinusitis requiring antibiotic Rx... ~  8/13: he had allergy f/u DrVanWinkle> skin testing 2010 pos to dust mites and molds; doing well on shots once/mo; stable & no changes made... ~  3/14: he presented w/ an  upper resp infection & treated w/ Depo, Dosepak, Zithromax... ~  8/14:  He had allergy f/u DrVanWinkle> Advair, Flonase, Saline, Mucinex; he checked FENO- reported normal... ~  7/15: stable on allergy meds from DrVanWinkle, plus AdvairHFA115; he had sinus/ AB exac 5/15 treated by TP w/ ZPak=> Levaquin & resolved...  OBSTRUCTIVE SLEEP APNEA (ICD-327.23) - sleep study 8/04 showed RDI= 45 & desat to 88%... mod snoring and leg jerks without sleep disruption... CPAP perscribed but not using it now- states he can't sleep w/ it on & furthermore he is resting well as long as he stays on his side... denies daytime hypersomnolence & not interested in re-evaluation.  COPD (ICD-496) - ex-smoker, quit 1981 >>  ~  on ADVAIR 100Bid, PROAIR Prn, MUCINEX Bid... he had Pneumovax in 2008... doesn't like Pred Rx...  ~  10/11:  doing well except recent cough, min green sputum (no change in dyspnea)- we will Rx w/ Augmentin, incr Mucinex/ Fluids. ~  4/12:  Breathing at baseline, continue maintenance meds... ~  CXR 12/13 showed normal heart size, clear lungs, NAD... ~  1/14:  He's had 2 exac this past yr- saw TP w/ Rx & improved... ~  1/15:  He continues to do well on Advair + allergy Rx from DrVanWinkle w/ Flonase, Saline, Mucinex... ~  7/15: exsmoker quit 1981; on AdvairHFA115-2spBid, Mucinex prn; he doesn't like Pred Rx; he had Bronchitic exac treated by TP 5/15 w/ ZPak, then Levaquin, Mucinex, etc & improved; needs incr exercise program... ~  10/15: seen w/ refractory bronchitic episode reqiring ZPak=>Levaquin, Medrol, Mucinex, Hydromet, etc... ~  1/16: He had a URI/ AB exac treated 10/15 w/ Levaquin/ Medrol/ Mucinex & resolved; now at baseline on Advair115-2spBid etc...  HYPERTENSION (ICD-401.9) - prev on diet alone> then on Diovan320, but we decided to change to LOSARTAN 100mg /d 5/13 to save $$ ~  He claims that Norvasc & HCTZ "stopped my urine flow"... ~  10/11:  BP= 128/80 & even better at home he says... denies  HA, fatigue,  visual changes, CP, palipit, dizziness, syncope, dyspnea, edema, etc... ~  4/12:  Pt has seen TP, DrHochrein, & checking BP daily at home; tried on Norvasc but claims it decr his urine flow; now on Diovan & improved w/ BP= 132/70... ~  5/12:  BP remains well controlled on Diovan 320mg /d= 120/62 today. ~  11/12:  BP= 142/82 on Diovan160 now; denies CP, palpit, dizzy, SOB, edema... ~  5/13:  BP= 128/66 & he's requesting change to cheaper med; rec switch Diovan to LOSARTAN 100mg /d... ~  1/14: on Losar100; BP=140/84, he notes incr HR he says & we decided to add low dose ATENOLOL25mg /d... ~  7/14: on Aten25, Losar100> BP=128/72 & he denies CP, palpit, SOB, edema, etc... ~  1/15: BP controlled on Aten25, Losar100 w/ BP= 130/80 & he denies CP, palpit, SOB, edema. ~  7/15: on Aten25, Losar100; BP=124/70, he denies CP, palpit, dizzy, SOB, edema, etc. ~  10/15: on Aten25, Losar100; BP= 140/60 & remains stable hemodynamically...  R/O CAD (ICD-414.00) - on ASA 81mg /d... Followed by DrHochrein & his notes are reviewed. ~  hx neg cardiac cath 1988 by DrJoe [place]... ~  NuclearStressTest 10/02 was norm- no scar or ischemia, EF=55%. ~  CT Abd 4/10 via ER showed coronary calcif... referred to Cards. ~  Eval by DrHochrein 6/10 showed norm EKG & cardiac exam... treadmill ordered. ~  Treadmill 6/10 showed reasonable exerc tolerance, no ischemic changes, few PVCs, sl incr BP... ~  EKG 3/13 showed NSR, rate76, WNL, NAD... ~  Myoview 3/13 was neg> exercised for 13min, stopped for fatigue & CP; no ST seg changes, occas PACs & PVCs; hypertensive BP response; no ischemia, EF=71%, normal wall motion... ~  He saw DrHochrein 4/14> hx CAD, doing satis on ASA & BP rx w/o angina & rec to follow program of aggressive risk factor reduction, no change in meds.  ~  EKG 4/14 showed NSR, rate60, wnl, NAD.. ~  6/15: .on ASA81; followed by DrHochrein- last seen 4/15- no new symptoms, active w/ yard work, EKG showed wnl,  they decided to do Treadmill (borderline ST depression), then Myoview 6/15 showed NEG- no ischemia, decr uptake inferiorly same as 2013, EF=69%, norm wall motion, low risk...  MIXED HYPERLIPIDEMIA (ICD-272.2) - low HDL and NIASPAN 500mg /d started 4/09... ~  [place] 4/09 showed TChol 160, TG 106, HDL 26, LDL 113... rec- diet + Niaspan 500/d. ~  FLP 10/09 showed TChol 155, TG 78, HDL 33, LDL 106... ~  FLP 4/10 showed TChol 133, TG 63, HDL 30, LDL 91... rec> continue same. ~  FLP 4/11 showed TChol 144, TG 61, HDL 38, LDL 94 ~  FLP 10/11 showed TChol 155, TG 81, HDL 40, LDL 99 ~  FLP 4/12 showed TChol 142, TG 69, HDL 38, LDL 90 ~  FLP 11/12 showed TChol 165, TG 82, HDL 43, LDL 106 ~  FLP 5/13 in Niasp500 showed TChol 155, TG 72, HDL 46, LDL 95... rec change to generic. ~  FLP 1/14 on Niacin500 showed TChol 140, TG 56, HDL 39, LDL 90 ~  FLP 1/15 on Niaspan500 showed TChol 148, TG 57, HDL 43, LDL 94  ~  FLP 1/16 on Niaspan500 showed TChol 150, TG 101, HDL 45, LDL 85  DM (ICD-250.00) - on diet + METFORMIN 500mg Bid... he reports BS at home all in the 120-130's... ~  labs 4/09 showed BS= 132, HgA1c= 6.6.Marland KitchenMarland Kitchen rec- same med, better diet... ~  labs 10/09 showed BS= 131, HgA1c= 6.3.Marland KitchenMarland Kitchen ~  labs 4/10  showed BS= 117, HgA1c= 6.2.[name]. rec> same meds/ diet Rx. ~  labs 10/10 showed BS= 138, A1c= 6.5 ~  labs 4/11 showed BS= 121, A1c= 6.5.Marland KitchenMarland Kitchen On Metform500Bid + diet. ~  Dilated eye exam 5/11 by DrShapiro- no retinopathy... ~  labs 10/11 (wt=228#) showed BS= 118, A1c= 7.1.[name]. not as good- get on diet or more meds. ~  Labs 4/12 (wt=229#) showed BS= 119, A1c= 7.3... Wrong direction, may need more meds, get wt down! ~  5/12:  Ophthalmology check by DrShapiro was neg- no retinopathy... ~  Labs 11/12 showed BS= 136, A1c= 7.0 ~  Labs 5/13 on MetformBid showed BS= 132, A1c= 7.1.Marland KitchenMarland Kitchen Continue same, get wt down. ~  Ophthalmology check from DrShapiro 5/13> no DM retinopathy... ~  Labs 1/14 showed BS= 134, A1c= 7.5; not as good- needs  better diet, get wt down, same meds for now... ~  Labs 7/14 on Metform500Bid showed BS= 140, A1c= 7.3  ~  Labs 1/154 on Metform500Bid showed BS= 129 & A1c= 7.0, and we reviewed meds/ diet/ exercise/ weight... ~  Labs 7/15 on Metform500Bid showed BS= 126, A1c= 7.1 & reminded of need for diet/ exerc/ wt reduction... ~  Labs 1/16 on Metform500Bid showed BS= 133, A1c= 7.2  GERD (ICD-530.81) & ? GASTROPARESIS - on PROTONIX 40mg /d, & off Reglan per DrPerry... ~   last EGD 6/07 by DrPerry showed gastic polyp... ~  8/10:  given Compazine suppos for gastroparesis flair by DrGessner. ~  4/12:  C/o intermittent dysphagia & referred back to GI for further eval (colonoscopy due as well)...  DIVERTICULOSIS OF COLON (ICD-562.10) - he uses MIRALAX Bid regularly... IRRITABLE BOWEL SYNDROME (ICD-564.1) COLONIC POLYPS (ICD-211.3) ~  last colonoscopy 5/07 by DrSam showed divertics only... f/u 78yrs. ~  CT Abd 4/10 in ER showed atx bases, coronary calcif, s/p GB, abdAo calcif, 45mm renal stone, divertics, bilat hip AVN. ~  Follow up colonoscopy by DrPerry 5/12 showed severe diverticulosis, 2 sm polyps= tubular adenoma & f/u planned 5 yrs...  RENAL CALCULUS, HX OF (ICD-V13.01) BENIGN PROSTATIC HYPERTROPHY, HX OF (ICD-V13.8) - on FLOMAX 0.4mg /d w/ improved symptoms (he states flow better since stopping Vit E supplement). ~  4/12:  Routine PSA= 4.9 (it was 1.02 4/11) & we rx w/ Doxy Bid x14d, thewn plan recheck PSA==> 1.30  DEGENERATIVE JOINT DISEASE (ICD-715.90) - pt states "I have a bum knee" and eval by DrDuda w/ viscosupplementation shots... Note: Abd CT w/ bilat hip AVN noted... takes TYLENOL Prn... ~  11/10:  s/p left knee arthroscopy by DrDuda... ~  10/11:  s/p fall w/ left knee injury- s/p cortisone shot by DrDuda; hx shot in left shoulder too. ~  7/14: he is c/o incr pain in left hip area; known hx AVN & needs f/u by Ortho...  BACK PAIN, LUMBAR (ICD-724.2)  ANXIETY (ICD-300.00) - not currently on meds  for nerves.  ANEMIA (ICD-285.9) - prev hx of GIB... 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  Health Maintenance: ~  GI: followed by DrPerry> Colonoscopy 5/07 by DrSam... f/u planned 5 yrs. ~  GU:  See PSAs recorded above... ~  Immunizations: Pneumovax in 2008 @ age  70;  PREVNAR given 7/15; Tetanus- given 4/10;  he gets yearly seasonal Flu vaccine.   Past Surgical History  Procedure Laterality Date  . Cholecystectomy    . Cataract extraction    . Skin cancer excision  3/10    left hand   . Knee surgery  11/10    Outpatient Encounter Prescriptions as of 04/10/2015  Medication Sig  . acetaminophen (TYLENOL) 500 MG tablet Take 500 mg by mouth every 6 (six) hours as needed.  [name] aspirin 81 MG tablet Take 81 mg by mouth daily.    [name] atenolol (TENORMIN) 25 MG tablet TAKE 1 TABLET EVERY DAY  . Cholecalciferol (VITAMIN D) 2000 UNITS CAPS Take 1 capsule by mouth daily.    . COMPRO 25 MG suppository PLACE 1 SUPPOSITORY (25 MG TOTAL) RECTALLY EVERY 12 (TWELVE) HOURS AS NEEDED FOR NAUSEA.  [name] dextromethorphan-guaiFENesin (MUCINEX DM) 30-600 MG per 12 hr tablet Take 1 tablet by mouth every 12 (twelve) hours.  . fluticasone (FLONASE) 50 MCG/ACT nasal spray Place 2 sprays into both nostrils daily.  . fluticasone-salmeterol (ADVAIR HFA) 115-21 MCG/ACT inhaler Inhale 1 puff into the lungs 2 (two) times daily.  . hyoscyamine (LEVSIN SL) 0.125 MG SL tablet Place 1 tablet (0.125 mg total) under the tongue every 4 (four) hours as needed. As needed for abdominal cramping  . lidocaine (ASPERCREME W/LIDOCAINE) 4 % cream Apply 1 application topically as needed.  . loratadine (CLARITIN) 10 MG tablet Take 10 mg by mouth daily as needed for allergies.  [name] losartan  (COZAAR) 100 MG tablet TAKE 1 TABLET BY MOUTH EVERY DAY  . metFORMIN (GLUCOPHAGE) 500 MG tablet TAKE 1 TABLET TWICE A DAY WITH A MEAL  . Multiple Vitamins-Minerals (MULTIVITAMIN WITH MINERALS) tablet Take 1 tablet by mouth daily.    . niacin (NIASPAN) 500 MG CR tablet Take 1 tablet (500 mg total) by mouth at bedtime.  . nitroGLYCERIN (NITROSTAT) 0.4 MG SL tablet Place 1 tablet (0.4 mg total) under the tongue every 5 (five) minutes as needed for chest pain.  . ONE TOUCH ULTRA TEST test strip TEST BLOOD SUGAR ONCE DAILY AS DIRECTED  . pantoprazole (PROTONIX) 40 MG tablet Take 1 tablet (40 mg total) by mouth daily.  . polyethylene glycol powder (GLYCOLAX/MIRALAX) powder TAKE 17 GRAMS BY MOUTH TWICE A DAY AS DIRECTED  . Probiotic Product (PROBIOTIC DAILY PO) Take by mouth daily.  . sodium chloride (OCEAN) 0.65 % nasal spray 1 spray by Nasal route as needed.    . sucralfate (CARAFATE) 1 GM/10ML suspension Take 1 gram before meals. (Patient taking differently: Take 1 gram before meals as needed)  . tamsulosin (FLOMAX) 0.4 MG CAPS capsule TAKE ONE CAPSULE TWICE A DAY  . traMADol (ULTRAM) 50 MG tablet TAKE 1 TABLET THREE TIMES A DAY AS NEEDED FOR PAIN   No facility-administered encounter medications on file as of 04/10/2015.    Allergies  Allergen Reactions  . Prednisone     REACTION: high dose intolerance    Immunization History  Administered Date(s) Administered  . H1N1 08/21/2008  . Influenza Split 06/01/2011, 05/26/2012  . Influenza Whole 06/06/2008, 06/21/2009, 06/06/2010  . Influenza,inj,Quad PF,36+ Mos 06/07/2013, 07/09/2014, 06/10/2015  . Pneumococcal Conjugate-13 04/05/2014  . Pneumococcal Polysaccharide-23 06/30/2006  . Zoster 09/14/2010    Current Medications, Allergies, Past Medical History, Past Surgical History, Family History, and Social History were reviewed in [employer] record.    Review of Systems         See HPI -  all other systems neg except  as noted... The patient complains of decreased hearing, dyspnea on exertion, headaches, and difficulty walking.  The patient denies anorexia, fever, weight loss, weight gain, vision loss, hoarseness, chest pain, syncope, peripheral edema, prolonged cough, hemoptysis, abdominal pain, melena, hematochezia, severe indigestion/heartburn, hematuria, incontinence, muscle weakness, suspicious skin lesions, transient blindness, depression, unusual weight change, abnormal bleeding, enlarged lymph nodes, and angioedema.     Objective:   Physical Exam      WD, WN, 79 y/o WM in NAD... GENERAL:  Alert & oriented; pleasant & cooperative... HEENT:  [place]/AT, EOM-full, PERRLA, EACs-clear, TMs-wnl, NOSE- pale, congested, THROAT-clear & wnl. NECK:  Supple w/ fairROM; no JVD; normal carotid impulses w/o bruits; no thyromegaly or nodules palpated; no lymphadenopathy. CHEST:  Clear to P & A w/o wheezing, rhonchi, rales, or signs of consolidation... HEART:  Regular Rhythm; without murmurs/ rubs/ or gallops detected...  ABDOMEN:  Soft & nontender; normal bowel sounds; no organomegaly or masses palpated., no guarding, or rebound EXT: without deformities, mild arthritic changes; no varicose veins/ venous insuffic/ or edema. NEURO:  CN's intact;  no focal neuro deficits... DERM:  No lesions noted; no rash etc...  RADIOLOGY DATA:  Reviewed in the EPIC EMR & discussed w/ the patient...  LABORATORY DATA:  Reviewed in the EPIC EMR & discussed w/ the patient...   Assessment & Plan:    Hx Acute Bronchitis> presented 10/15 w/ refractory bronchitic episode; given ZPak=>Levaquin, Depo80/ Medrol dosepak, Mucinex, Hydromet, etc=> resolved... COPD exac>  He continues on Advair, Proair rescue, Mucinex; also Claritin, Flonase, Saline; stable continue same Rx...   HBP>  On LOSARTAN 100mg /d & CWUG89 w/ good control of BP, continue same...  R/O CAD>  Followed by DrHochrein, and doing satis w/ neg Myoview 6/15... continue ASA &  risk factor reduction....  Lipids>  Looks satis on his diet + Niacin...  DM>  Control is fair w/ A1c stable at 7.1; he was told there are more meds in his future if he doesn't get his wt down (currently~230#)...  GI>  Per DrPerry w/ GERD, ?Gastroparesis, Divertics, IBS, Colon polyps> f/u colon 5/12 w/ divertics & 2 sm adenomas, repeat planned ~22yrs...  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  Anxiety>  Certainly an issue, he does not want anxiolytic Rx.Marland KitchenMarland Kitchen

## 2015-04-10 NOTE — Patient Instructions (Signed)
Today we updated your med list in our EPIC system...    Continue your current medications the same...    We refilled your meds per request...  Check back w/ DrLupton regarding your skin lesions....  Today we checked your metabolic panel & L8X test...    We will contact you w/ the results when available...   Keep up the good work w/ diet & exercise...  Call for any questions...  Let's plan a follow up visit in 20mo, sooner if needed for problems.Marland KitchenMarland Kitchen

## 2015-05-02 DIAGNOSIS — E119 Type 2 diabetes mellitus without complications: Secondary | ICD-10-CM | POA: Diagnosis not present

## 2015-05-02 DIAGNOSIS — Z961 Presence of intraocular lens: Secondary | ICD-10-CM | POA: Diagnosis not present

## 2015-05-02 LAB — HM DIABETES EYE EXAM

## 2015-05-07 DIAGNOSIS — J3089 Other allergic rhinitis: Secondary | ICD-10-CM | POA: Diagnosis not present

## 2015-05-07 DIAGNOSIS — C44621 Squamous cell carcinoma of skin of unspecified upper limb, including shoulder: Secondary | ICD-10-CM | POA: Diagnosis not present

## 2015-05-07 DIAGNOSIS — C44629 Squamous cell carcinoma of skin of left upper limb, including shoulder: Secondary | ICD-10-CM | POA: Diagnosis not present

## 2015-05-07 DIAGNOSIS — J4540 Moderate persistent asthma, uncomplicated: Secondary | ICD-10-CM | POA: Diagnosis not present

## 2015-05-13 ENCOUNTER — Encounter: Payer: Self-pay | Admitting: Pulmonary Disease

## 2015-05-23 ENCOUNTER — Telehealth: Payer: Self-pay | Admitting: Pulmonary Disease

## 2015-05-23 MED ORDER — AZITHROMYCIN 250 MG PO TABS: ORAL_TABLET | ORAL | Status: DC

## 2015-05-23 NOTE — Telephone Encounter (Signed)
Patient says he has a lot of sinus drainage down the back of his throat, has has headaches and sinus pressure.  Patient says that he has tried extra strength mucinex, Chloraseptic throat spray, and saline wash, nothing is working.  He is going on vacation this weekend and would like a ZPAK called in before he leaves tomorrow.  [facility]  Allergies  Allergen Reactions  . Prednisone     REACTION: high dose intolerance    Current Outpatient Prescriptions on File Prior to Visit  Medication Sig Dispense Refill  . acetaminophen (TYLENOL) 500 MG tablet Take 500 mg by mouth every 6 (six) hours as needed.    [name] aspirin 81 MG tablet Take 81 mg by mouth daily.      [name] atenolol (TENORMIN) 25 MG tablet Take 1 tablet (25 mg total) by mouth daily. 30 tablet 11  . Cholecalciferol (VITAMIN D) 2000 UNITS CAPS Take 1 capsule by mouth daily.      . COMPRO 25 MG suppository PLACE 1 SUPPOSITORY (25 MG TOTAL) RECTALLY EVERY 12 (TWELVE) HOURS AS NEEDED FOR NAUSEA. 12 suppository 0  . dextromethorphan-guaiFENesin (MUCINEX DM) 30-600 MG per 12 hr tablet Take 1 tablet by mouth every 12 (twelve) hours.    . fluticasone (FLONASE) 50 MCG/ACT nasal spray Place 2 sprays into both nostrils daily. 16 g 11  . fluticasone-salmeterol (ADVAIR HFA) 115-21 MCG/ACT inhaler Inhale 1 puff into the lungs 2 (two) times daily. 1 Inhaler 11  . glucose blood (ONE TOUCH ULTRA TEST) test strip TEST BLOOD SUGAR ONCE DAILY AS DIRECTED 100 each 4  . hyoscyamine (LEVSIN SL) 0.125 MG SL tablet Place 1 tablet (0.125 mg total) under the tongue every 4 (four) hours as needed. As needed for abdominal cramping 90 tablet 5  . lidocaine (ASPERCREME W/LIDOCAINE) 4 % cream Apply 1 application topically as needed.    . loratadine (CLARITIN) 10 MG tablet Take 10 mg by mouth daily as needed for allergies.    [name] losartan (COZAAR) 100 MG tablet Take 1 tablet (100 mg total) by mouth daily. 90 tablet 0  . metFORMIN (GLUCOPHAGE) 500 MG tablet TAKE 1  TABLET TWICE A DAY WITH A MEAL 60 tablet 11  . Multiple Vitamins-Minerals (MULTIVITAMIN WITH MINERALS) tablet Take 1 tablet by mouth daily.      . niacin (NIASPAN) 500 MG CR tablet Take 1 tablet (500 mg total) by mouth at bedtime. 30 tablet 11  . nitroGLYCERIN (NITROSTAT) 0.4 MG SL tablet Place 1 tablet (0.4 mg total) under the tongue every 5 (five) minutes as needed for chest pain. 30 tablet 11  . pantoprazole (PROTONIX) 40 MG tablet Take 1 tablet (40 mg total) by mouth daily. 30 tablet 11  . polyethylene glycol powder (GLYCOLAX/MIRALAX) powder TAKE 17 GRAMS BY MOUTH TWICE A DAY AS DIRECTED 1054 g 11  . Probiotic Product (PROBIOTIC DAILY PO) Take by mouth daily.    . sodium chloride (OCEAN) 0.65 % nasal spray 1 spray by Nasal route as needed.      . sucralfate (CARAFATE) 1 GM/10ML suspension Take 1 gram before meals. (Patient taking differently: Take 1 gram before meals as needed) 420 mL 0  . tamsulosin (FLOMAX) 0.4 MG CAPS capsule TAKE ONE CAPSULE TWICE A DAY 60 capsule 5  . traMADol (ULTRAM) 50 MG tablet TAKE 1 TABLET THREE TIMES A DAY AS NEEDED FOR PAIN 90 tablet 5   No current facility-administered medications on file prior to visit.

## 2015-05-23 NOTE — Telephone Encounter (Signed)
Per SN,  Ok to order Z-pak per pt request

## 2015-05-23 NOTE — Telephone Encounter (Signed)
Rx for Zpak sent to pharmacy. Patient notified. Nothing further needed.

## 2015-06-10 ENCOUNTER — Ambulatory Visit (INDEPENDENT_AMBULATORY_CARE_PROVIDER_SITE_OTHER): Payer: Medicare Other

## 2015-06-10 DIAGNOSIS — Z23 Encounter for immunization: Secondary | ICD-10-CM

## 2015-06-18 DIAGNOSIS — K140 Glossitis: Secondary | ICD-10-CM | POA: Diagnosis not present

## 2015-06-18 DIAGNOSIS — Z08 Encounter for follow-up examination after completed treatment for malignant neoplasm: Secondary | ICD-10-CM | POA: Diagnosis not present

## 2015-06-18 DIAGNOSIS — L570 Actinic keratosis: Secondary | ICD-10-CM | POA: Diagnosis not present

## 2015-06-18 DIAGNOSIS — Z85828 Personal history of other malignant neoplasm of skin: Secondary | ICD-10-CM | POA: Diagnosis not present

## 2015-07-16 ENCOUNTER — Ambulatory Visit: Payer: Medicare Other | Admitting: Adult Health

## 2015-07-18 ENCOUNTER — Ambulatory Visit (INDEPENDENT_AMBULATORY_CARE_PROVIDER_SITE_OTHER): Payer: Medicare Other | Admitting: Adult Health

## 2015-07-18 ENCOUNTER — Encounter: Payer: Self-pay | Admitting: Adult Health

## 2015-07-18 VITALS — BP 123/74 | HR 69 | Temp 97.5°F | Ht 70.0 in | Wt 220.0 lb

## 2015-07-18 DIAGNOSIS — I2510 Atherosclerotic heart disease of native coronary artery without angina pectoris: Secondary | ICD-10-CM | POA: Diagnosis not present

## 2015-07-18 DIAGNOSIS — K148 Other diseases of tongue: Secondary | ICD-10-CM | POA: Diagnosis not present

## 2015-07-18 NOTE — Patient Instructions (Signed)
Do not sleep with upper denture plate.  See Dr. Allyson Sabal Dermatology on 08/07/15 at 9 am (arrive 89min early ) for evaluation .  Follow up Dr. Lenna Gilford  As planned and As needed

## 2015-07-21 NOTE — Progress Notes (Signed)
Subjective:    Patient ID: Jesse Santos, male    DOB: 1935-10-26, 79 y.o.   MRN: [id number]  HPI 79 yo male, former smoker ,  with multiple medical problems including HTN, COPD , DM and dyslipdemia Followed by Dr. Lenna Gilford  For primary care .   07/18/15 Acute OV  [name].  Complains over last 6 months has woke up several times in morning with mouth bleeding. Says it is coming from his tongue . Feels his tongue is sore as well.  No known injury but wears his upper dentures to bed.  Says he was seen in dermatology and given gel to place on it but it has not helped.  He is followed by Dr. Allyson Sabal.  No weight loss, chest pain,orthopnea, edema or fever.    Past Medical History  Diagnosis Date  . Hearing loss   . Allergic rhinitis, cause unspecified   . Obstructive sleep apnea (adult) (pediatric)   . COPD (chronic obstructive pulmonary disease) ([place])   . Unspecified essential hypertension   . Mixed hyperlipidemia   . Coronary artery disease     Coronary calcification with negative ETT 2010;   ETT-Myoview (02/2014):  Normal stress nuclear study. No evidence of ischemia.  LV Ejection Fraction: 69%  . Type II or unspecified type diabetes mellitus without mention of complication, not stated as uncontrolled   . Esophageal reflux   . Diverticulosis of colon (without mention of hemorrhage)   . Irritable bowel syndrome   . Benign neoplasm of colon   . Renal calculus   . BPH (benign prostatic hypertrophy)   . Degenerative joint disease   . Lumbar back pain   . Anxiety   . Anemia   . Hyperplastic colon polyp 02/12/2011   Current Outpatient Prescriptions on File Prior to Visit  Medication Sig Dispense Refill  . acetaminophen (TYLENOL) 500 MG tablet Take 500 mg by mouth every 6 (six) hours as needed.    [name] aspirin 81 MG tablet Take 81 mg by mouth daily.      [name] atenolol (TENORMIN) 25 MG tablet Take 1 tablet (25 mg total) by mouth daily. 30 tablet 11  .  Cholecalciferol (VITAMIN D) 2000 UNITS CAPS Take 1 capsule by mouth daily.      . COMPRO 25 MG suppository PLACE 1 SUPPOSITORY (25 MG TOTAL) RECTALLY EVERY 12 (TWELVE) HOURS AS NEEDED FOR NAUSEA. 12 suppository 0  . dextromethorphan-guaiFENesin (MUCINEX DM) 30-600 MG per 12 hr tablet Take 1 tablet by mouth every 12 (twelve) hours.    . fluticasone (FLONASE) 50 MCG/ACT nasal spray Place 2 sprays into both nostrils daily. 16 g 11  . fluticasone-salmeterol (ADVAIR HFA) 115-21 MCG/ACT inhaler Inhale 1 puff into the lungs 2 (two) times daily. 1 Inhaler 11  . glucose blood (ONE TOUCH ULTRA TEST) test strip TEST BLOOD SUGAR ONCE DAILY AS DIRECTED 100 each 4  . hyoscyamine (LEVSIN SL) 0.125 MG SL tablet Place 1 tablet (0.125 mg total) under the tongue every 4 (four) hours as needed. As needed for abdominal cramping 90 tablet 5  . lidocaine (ASPERCREME W/LIDOCAINE) 4 % cream Apply 1 application topically as needed.    . loratadine (CLARITIN) 10 MG tablet Take 10 mg by mouth daily as needed for allergies.    [name] losartan (COZAAR) 100 MG tablet Take 1 tablet (100 mg total) by mouth daily. 90 tablet 0  . metFORMIN (GLUCOPHAGE) 500 MG tablet TAKE 1 TABLET TWICE A DAY  WITH A MEAL 60 tablet 11  . Multiple Vitamins-Minerals (MULTIVITAMIN WITH MINERALS) tablet Take 1 tablet by mouth daily.      . niacin (NIASPAN) 500 MG CR tablet Take 1 tablet (500 mg total) by mouth at bedtime. 30 tablet 11  . nitroGLYCERIN (NITROSTAT) 0.4 MG SL tablet Place 1 tablet (0.4 mg total) under the tongue every 5 (five) minutes as needed for chest pain. 30 tablet 11  . pantoprazole (PROTONIX) 40 MG tablet Take 1 tablet (40 mg total) by mouth daily. 30 tablet 11  . polyethylene glycol powder (GLYCOLAX/MIRALAX) powder TAKE 17 GRAMS BY MOUTH TWICE A DAY AS DIRECTED 1054 g 11  . sodium chloride (OCEAN) 0.65 % nasal spray 1 spray by Nasal route as needed.      . sucralfate (CARAFATE) 1 GM/10ML suspension Take 1 gram before meals. (Patient taking  differently: Take 1 gram before meals as needed) 420 mL 0  . tamsulosin (FLOMAX) 0.4 MG CAPS capsule TAKE ONE CAPSULE TWICE A DAY 60 capsule 5  . traMADol (ULTRAM) 50 MG tablet TAKE 1 TABLET THREE TIMES A DAY AS NEEDED FOR PAIN 90 tablet 5  . Probiotic Product (PROBIOTIC DAILY PO) Take by mouth daily.     No current facility-administered medications on file prior to visit.      Review of Systems Constitutional:   No  weight loss, night sweats,  Fevers, chills, fatigue, or  lassitude.  HEENT:   No headaches,  Difficulty swallowing,  Tooth/dental problems, or  Sore throat,                No sneezing, itching, ear ache, nasal congestion, post nasal drip,  +tongue issues.   CV:  No chest pain,  Orthopnea, PND, swelling in lower extremities, anasarca, dizziness, palpitations, syncope.   GI  No heartburn, indigestion, abdominal pain, nausea, vomiting, diarrhea, change in bowel habits, loss of appetite, bloody stools.   Resp:    No chest wall deformity  Skin: no rash or lesions.  GU: no dysuria, change in color of urine, no urgency or frequency.  No flank pain, no hematuria   MS:  No joint pain or swelling.  No decreased range of motion.  No back pain.  Psych:  No change in mood or affect. No depression or anxiety.  No memory loss.         Objective:   Physical Exam  GEN: A/Ox3; pleasant , NAD, elderly   HEENT:  [place]/AT,  EACs-clear, TMs-wnl, NOSE-clear, THROAT-clear, no lesions, no postnasal drip or exudate noted. Along the left lateral mid to distal tongue small excoriated area noted , no palpable mass.  Upper dentures   NECK:  Supple w/ fair ROM; no JVD; normal carotid impulses w/o bruits; no thyromegaly or nodules palpated; no lymphadenopathy.  RESP  Clear  P & A; w/o, wheezes/ rales/ or rhonchi.no accessory muscle use, no dullness to percussion  CARD:  RRR, no m/r/g  , no peripheral edema, pulses intact, no cyanosis or clubbing.  GI:   Soft & nt; nml bowel sounds; no  organomegaly or masses detected.  Musco: Warm bil, no deformities or joint swelling noted.   Neuro: alert, no focal deficits noted.    Skin: Warm, no lesions or rashes        Assessment & Plan:

## 2015-07-22 NOTE — Assessment & Plan Note (Signed)
Tongue lesion w/ intermittent bleeding ? Etiology -possible recurrent trauma from dentures however excoriated area  Will need to be evaluated to see if bx is indicated.   Plan  o not sleep with upper denture plate.  See Dr. Allyson Sabal Dermat[facility] on 08/07/15 at 9 am (arrive 53min early ) for evaluation .  Follow up Dr. Lenna Gilford  As planned and As needed

## 2015-07-30 ENCOUNTER — Telehealth: Payer: Self-pay | Admitting: Pulmonary Disease

## 2015-07-30 MED ORDER — PREDNISONE 10 MG (21) PO TBPK: 10.0000 mg | ORAL_TABLET | Freq: Every day | ORAL | Status: DC

## 2015-07-30 MED ORDER — LEVOFLOXACIN 750 MG PO TABS: 750.0000 mg | ORAL_TABLET | Freq: Every day | ORAL | Status: DC

## 2015-07-30 NOTE — Telephone Encounter (Signed)
Spoke with pt, c/o sinus congestion tinged with both bright and dark blood since Thursday night.  Denies fever, chest congestion.   Pt has taken mucinex DM, claritin, saline nasal spray, and fluticasone.   Pt requesting something be called in before this "goes into his lungs".  [facility] [address]    SN please advise.  Thanks!

## 2015-07-30 NOTE — Telephone Encounter (Signed)
Per SN>> Call in Levaquin 750mg  #7 with instructions to take 1 QD until gone, pred 10mg  6 day dose pak to pt's pharmacy. Advise pt to also take a OTC probiotic like Align while on antibiotic. Instruct pt to call office back if medications do not work and having same symptoms because an urgent referral for ENT specialist will need to be placed  Called and spoke with pt and advised of SN rec and medications instructions. Pt voiced understanding and stated he will call office back if not better soon  Orders sent electronically to pt's pharmacy Nothing further is needed at this time.

## 2015-08-07 DIAGNOSIS — L439 Lichen planus, unspecified: Secondary | ICD-10-CM | POA: Diagnosis not present

## 2015-08-07 DIAGNOSIS — D485 Neoplasm of uncertain behavior of skin: Secondary | ICD-10-CM | POA: Diagnosis not present

## 2015-08-07 DIAGNOSIS — L438 Other lichen planus: Secondary | ICD-10-CM | POA: Diagnosis not present

## 2015-08-07 DIAGNOSIS — L989 Disorder of the skin and subcutaneous tissue, unspecified: Secondary | ICD-10-CM | POA: Diagnosis not present

## 2015-08-09 ENCOUNTER — Other Ambulatory Visit: Payer: Self-pay | Admitting: Pulmonary Disease

## 2015-08-16 ENCOUNTER — Telehealth: Payer: Self-pay | Admitting: Pulmonary Disease

## 2015-08-16 MED ORDER — AZITHROMYCIN 250 MG PO TABS: ORAL_TABLET | ORAL | Status: DC

## 2015-08-16 NOTE — Telephone Encounter (Signed)
Per SN>> Order Z-pak for pt with instructions to use as directed. Inform pt that if medication does not work he will need an office visit to be evaluated by SN.  Order sent to pt's pharmacy electronically Called and spoke with pt and informed of SN rec Pt voiced understanding  Nothing further is needed at this time.

## 2015-08-16 NOTE — Telephone Encounter (Signed)
Last ov on 07/18/15 with SN Next ov on 10/15/15 with SN  Called and spoke with the patient. He c/o prod cough with yellow mucus, sinus drainage, sore throat, and chest tightness since 08/13/15. States he completed the Levaquin 750mg  #7 and pred 10mg  6 day dose  pak given on 07/30/15 by SN. Patient denies any SOB, wheezing, fever, nausea or vomiting. [facility]. I explained to him that I would send a message to SN and would call him back with his recs. Patient voiced understanding and had no further questions.  SN please advise  Allergies  Allergen Reactions  . Prednisone     REACTION: high dose intolerance    Current Outpatient Prescriptions on File Prior to Visit  Medication Sig Dispense Refill  . acetaminophen (TYLENOL) 500 MG tablet Take 500 mg by mouth every 6 (six) hours as needed.    [name] aspirin 81 MG tablet Take 81 mg by mouth daily.      [name] atenolol (TENORMIN) 25 MG tablet Take 1 tablet (25 mg total) by mouth daily. 30 tablet 11  . Cholecalciferol (VITAMIN D) 2000 UNITS CAPS Take 1 capsule by mouth daily.      . COMPRO 25 MG suppository PLACE 1 SUPPOSITORY (25 MG TOTAL) RECTALLY EVERY 12 (TWELVE) HOURS AS NEEDED FOR NAUSEA. 12 suppository 0  . Desoximetasone 0.05 % GEL APPLY TO AFFECTED AREA TWICE A DAY  1  . dextromethorphan-guaiFENesin (MUCINEX DM) 30-600 MG per 12 hr tablet Take 1 tablet by mouth every 12 (twelve) hours.    . fluticasone (FLONASE) 50 MCG/ACT nasal spray Place 2 sprays into both nostrils daily. 16 g 11  . fluticasone-salmeterol (ADVAIR HFA) 115-21 MCG/ACT inhaler Inhale 1 puff into the lungs 2 (two) times daily. 1 Inhaler 11  . glucose blood (ONE TOUCH ULTRA TEST) test strip TEST BLOOD SUGAR ONCE DAILY AS DIRECTED 100 each 4  . hyoscyamine (LEVSIN SL) 0.125 MG SL tablet Place 1 tablet (0.125 mg total) under the tongue every 4 (four) hours as needed. As needed for abdominal cramping 90 tablet 5  . levofloxacin  (LEVAQUIN) 750 MG tablet Take 1 tablet (750 mg total) by mouth daily. 7 tablet 0  . lidocaine (ASPERCREME W/LIDOCAINE) 4 % cream Apply 1 application topically as needed.    . loratadine (CLARITIN) 10 MG tablet Take 10 mg by mouth daily as needed for allergies.    [name] losartan (COZAAR) 100 MG tablet TAKE 1 TABLET (100 MG TOTAL) BY MOUTH DAILY. 90 tablet 0  . metFORMIN (GLUCOPHAGE) 500 MG tablet TAKE 1 TABLET TWICE A DAY WITH A MEAL 60 tablet 11  . Multiple Vitamins-Minerals (MULTIVITAMIN WITH MINERALS) tablet Take 1 tablet by mouth daily.      . niacin (NIASPAN) 500 MG CR tablet Take 1 tablet (500 mg total) by mouth at bedtime. 30 tablet 11  . nitroGLYCERIN (NITROSTAT) 0.4 MG SL tablet Place 1 tablet (0.4 mg total) under the tongue every 5 (five) minutes as needed for chest pain. 30 tablet 11  . pantoprazole (PROTONIX) 40 MG tablet Take 1 tablet (40 mg total) by mouth daily. 30 tablet 11  . polyethylene glycol powder (GLYCOLAX/MIRALAX) powder TAKE 17 GRAMS BY MOUTH TWICE A DAY AS DIRECTED 1054 g 11  . predniSONE (STERAPRED UNI-PAK 21 TAB) 10 MG (21) TBPK tablet Take 1 tablet (10 mg total) by mouth daily. 14 tablet 0  . Probiotic Product (PROBIOTIC DAILY PO) Take by mouth daily.    [name]  sodium chloride (OCEAN) 0.65 % nasal spray 1 spray by Nasal route as needed.      . sucralfate (CARAFATE) 1 GM/10ML suspension Take 1 gram before meals. (Patient taking differently: Take 1 gram before meals as needed) 420 mL 0  . tamsulosin (FLOMAX) 0.4 MG CAPS capsule TAKE ONE CAPSULE TWICE A DAY 60 capsule 5  . traMADol (ULTRAM) 50 MG tablet TAKE 1 TABLET THREE TIMES A DAY AS NEEDED FOR PAIN 90 tablet 5   No current facility-administered medications on file prior to visit.

## 2015-09-07 ENCOUNTER — Other Ambulatory Visit: Payer: Self-pay | Admitting: Pulmonary Disease

## 2015-09-11 ENCOUNTER — Encounter: Payer: Self-pay | Admitting: *Deleted

## 2015-09-17 DIAGNOSIS — D485 Neoplasm of uncertain behavior of skin: Secondary | ICD-10-CM | POA: Diagnosis not present

## 2015-10-07 ENCOUNTER — Other Ambulatory Visit: Payer: Self-pay | Admitting: Pulmonary Disease

## 2015-10-11 ENCOUNTER — Ambulatory Visit: Payer: PRIVATE HEALTH INSURANCE | Admitting: Pulmonary Disease

## 2015-10-14 ENCOUNTER — Ambulatory Visit: Payer: PRIVATE HEALTH INSURANCE | Admitting: Pulmonary Disease

## 2015-10-15 ENCOUNTER — Other Ambulatory Visit (INDEPENDENT_AMBULATORY_CARE_PROVIDER_SITE_OTHER): Payer: Medicare Other

## 2015-10-15 ENCOUNTER — Ambulatory Visit (INDEPENDENT_AMBULATORY_CARE_PROVIDER_SITE_OTHER): Payer: Medicare Other | Admitting: Pulmonary Disease

## 2015-10-15 ENCOUNTER — Encounter: Payer: Self-pay | Admitting: Pulmonary Disease

## 2015-10-15 VITALS — BP 126/70 | HR 65 | Temp 97.7°F | Ht 70.0 in | Wt 219.6 lb

## 2015-10-15 DIAGNOSIS — I10 Essential (primary) hypertension: Secondary | ICD-10-CM | POA: Diagnosis not present

## 2015-10-15 DIAGNOSIS — N138 Other obstructive and reflux uropathy: Secondary | ICD-10-CM

## 2015-10-15 DIAGNOSIS — E782 Mixed hyperlipidemia: Secondary | ICD-10-CM

## 2015-10-15 DIAGNOSIS — N401 Enlarged prostate with lower urinary tract symptoms: Secondary | ICD-10-CM

## 2015-10-15 DIAGNOSIS — M545 Low back pain: Secondary | ICD-10-CM

## 2015-10-15 DIAGNOSIS — J449 Chronic obstructive pulmonary disease, unspecified: Secondary | ICD-10-CM

## 2015-10-15 DIAGNOSIS — F411 Generalized anxiety disorder: Secondary | ICD-10-CM

## 2015-10-15 DIAGNOSIS — K3184 Gastroparesis: Secondary | ICD-10-CM

## 2015-10-15 DIAGNOSIS — E119 Type 2 diabetes mellitus without complications: Secondary | ICD-10-CM | POA: Diagnosis not present

## 2015-10-15 DIAGNOSIS — M159 Polyosteoarthritis, unspecified: Secondary | ICD-10-CM

## 2015-10-15 DIAGNOSIS — K148 Other diseases of tongue: Secondary | ICD-10-CM

## 2015-10-15 DIAGNOSIS — I2510 Atherosclerotic heart disease of native coronary artery without angina pectoris: Secondary | ICD-10-CM

## 2015-10-15 DIAGNOSIS — J4489 Other specified chronic obstructive pulmonary disease: Secondary | ICD-10-CM

## 2015-10-15 DIAGNOSIS — M150 Primary generalized (osteo)arthritis: Secondary | ICD-10-CM

## 2015-10-15 LAB — CBC WITH DIFFERENTIAL/PLATELET
BASOS ABS: 0 10*3/uL (ref 0.0–0.1)
Basophils Relative: 0.8 % (ref 0.0–3.0)
EOS ABS: 0.1 10*3/uL (ref 0.0–0.7)
Eosinophils Relative: 1.5 % (ref 0.0–5.0)
HCT: 41 % (ref 39.0–52.0)
Hemoglobin: 13.3 g/dL (ref 13.0–17.0)
LYMPHS ABS: 1.1 10*3/uL (ref 0.7–4.0)
LYMPHS PCT: 22.3 % (ref 12.0–46.0)
MCHC: 32.5 g/dL (ref 30.0–36.0)
MCV: 88.9 fl (ref 78.0–100.0)
Monocytes Absolute: 0.4 10*3/uL (ref 0.1–1.0)
Monocytes Relative: 8.3 % (ref 3.0–12.0)
NEUTROS ABS: 3.4 10*3/uL (ref 1.4–7.7)
NEUTROS PCT: 67.1 % (ref 43.0–77.0)
PLATELETS: 193 10*3/uL (ref 150.0–400.0)
RBC: 4.61 Mil/uL (ref 4.22–5.81)
RDW: 13.5 % (ref 11.5–15.5)
WBC: 5.1 10*3/uL (ref 4.0–10.5)

## 2015-10-15 LAB — HEPATIC FUNCTION PANEL
ALK PHOS: 76 U/L (ref 39–117)
ALT: 13 U/L (ref 0–53)
AST: 19 U/L (ref 0–37)
Albumin: 4.3 g/dL (ref 3.5–5.2)
Bilirubin, Direct: 0.1 mg/dL (ref 0.0–0.3)
TOTAL PROTEIN: 7.1 g/dL (ref 6.0–8.3)
Total Bilirubin: 0.6 mg/dL (ref 0.2–1.2)

## 2015-10-15 LAB — HEMOGLOBIN A1C: HEMOGLOBIN A1C: 6.8 % — AB (ref 4.6–6.5)

## 2015-10-15 LAB — LIPID PANEL
Cholesterol: 132 mg/dL (ref 0–200)
HDL: 46.1 mg/dL (ref >39.00)
LDL Cholesterol: 75 mg/dL (ref 0–99)
NonHDL: 85.88
Total CHOL/HDL Ratio: 3
Triglycerides: 52 mg/dL (ref 0.0–149.0)
VLDL: 10.4 mg/dL (ref 0.0–40.0)

## 2015-10-15 LAB — BASIC METABOLIC PANEL
BUN: 18 mg/dL (ref 6–23)
CALCIUM: 9.9 mg/dL (ref 8.4–10.5)
CO2: 27 mEq/L (ref 19–32)
Chloride: 106 mEq/L (ref 96–112)
Creatinine, Ser: 0.89 mg/dL (ref 0.40–1.50)
GFR: 87.85 mL/min (ref >60.00)
GLUCOSE: 134 mg/dL — AB (ref 70–99)
Potassium: 5 mEq/L (ref 3.5–5.1)
Sodium: 141 mEq/L (ref 135–145)

## 2015-10-15 LAB — TSH: TSH: 1.08 u[IU]/mL (ref 0.35–4.50)

## 2015-10-15 LAB — PSA: PSA: 1.06 ng/mL (ref 0.10–4.00)

## 2015-10-15 MED ORDER — FLUTICASONE-SALMETEROL 115-21 MCG/ACT IN AERO: 1.0000 | INHALATION_SPRAY | Freq: Two times a day (BID) | RESPIRATORY_TRACT | Status: DC

## 2015-10-15 MED ORDER — TAMSULOSIN HCL 0.4 MG PO CAPS: 0.4000 mg | ORAL_CAPSULE | Freq: Two times a day (BID) | ORAL | Status: DC

## 2015-10-15 MED ORDER — TRAMADOL HCL 50 MG PO TABS: ORAL_TABLET | ORAL | Status: DC

## 2015-10-15 MED ORDER — FLUTICASONE PROPIONATE 50 MCG/ACT NA SUSP: 2.0000 | Freq: Every day | NASAL | Status: DC

## 2015-10-15 NOTE — Patient Instructions (Signed)
Today we updated your med list in our EPIC system...    Continue your current medications the same...  Today we rechecked your FASTING blood work...    We will contact you w/ the results when available...   We will arrange for a second opinion consult w/ an ENT specialist regarding the tongue lesion...  Call for any questions...  Let's plan a follow up visit in 75mo, sooner if needed for problems.Marland KitchenMarland Kitchen

## 2015-10-15 NOTE — Progress Notes (Signed)
Subjective:    Patient ID: Jesse Santos, male    DOB: 1936-02-23, 80 y.o.   MRN: [id number]  HPI 80 y/o WM w/ mult med problems here for a follow up visit>  ~  SEE PREV EOPIC NOTES FOR OLDER DATA >>     LABS 1/14:  FLP- at goals on Niacin rx;  Chems- ok w/ BS=134, A1c=7.5;  CBC- wnl;  TSH=1.13;  VitD=42;  PSA=1.10      LABS 7/14:  Chems- ok x BS=140, A1c=7.3...   1/15> Jesse Santos reports that DrDuda plans a left THR (for avasc necreosis) whenever he is ready, but he's not rushing it, on Tramadol prn & doing satis for now...   LABS 1/15:  FLP- at goals on diet+Niaspan;  Chesm- ok w/ BS=129, A1c=7.0;  CBC- wnl;  TSH=1.06...  EKG 4/15 showed NSR, rate70, wnl, NAD...  Treadmill 6/15 (DrBrackbill) showed borderline ST segment changes w/ exercise & a run of atrial tachy reported...   Myoview 6/15 showed good exerc capacity, norm BP response, no CP, no evid ischemia, decr uptake inferiorly on rest images similar to 2013, EF=69%, norm wall motion...  LABS 7/15:  Chems- ok w/ BS=126, A1c=7.1.Marland KitchenMarland Kitchen rec to continue Metform500Bid & get on diet, incr exerc, get wt down!!!   ~  October 10, 2014:  65mo ROV & Jesse Santos indicates it took several wks to get over his URI/AB episode in Oct;      On Advair115-2spBid, Mucinex, flonase, claritin; he reports breathing is stable- min cough, no sput, stable DOE, no edema...     He remains on Niasp500/d; FLP 1/16 showed TChol 150, TG 101, HDL 45, LDL 85    He continues Metform500Bid for his DM; Labs 1/16 showed BS=133, A1c=7.2    He has some lower abd pain/ cramping & has used the Levsin w/ relief- c/w IBS, some diarrhea, nausea, groin discomfort; he had colonoscopy 2012 w/ 2 polyps removed & f/u planned 2017...     He sees DrDuda for Ortho & takes Tramadol50 aS NEEDED... We reviewed prob list, meds, xrays and labs> see below for updates >>   CXR 1/16 showed norm heart size, clear lungs, DJD in Tspine, NAD...  LABS 1/16:  FLP- at goals on Niasp;  Chems- ok w/ BS=133  A1c=7.2;  CBC- wnl;  TSH=1.59;  PSA=1.25...   ~  April 10, 2015:  87mo ROV & Jesse Santos reports stable w/ CC= arthritis pain in shoulders/ hips, he sees DrDuda, told he needs left THR but he is holding off, Tramadol helps;  We reviewed the following medical problems during today's office visit >>    HOH> he has bilat hearing aides...    AR> on allergy shots, OTC antihist, Saline, Flonase; gets occas bouts of sinusitis but overall improved & no recent infections reported...     OSA> Sleep Study was in 2/04 w/ RDI=45 & desat to 88% w/ mod snoring & leg jerks, but he states he never could use the CPAP effectively & states he sleeps fine now if he stays on his side; denies daytime sleepiness or other issues...    COPD> exsmoker quit 1981; on AdvairHFA115-2spBid, Mucinex prn; he doesn't like Pred Rx; he had Bronchitic exac treated 10/15 w/ Levaquin, Medrol, Mucinex, etc & improved; needs incr exercise program...    HBP> on Aten25, Losar100; BP=120/70, he denies CP, palpit, dizzy, SOB, edema, etc...    CAD> on ASA81; followed by DrHochrein- CT Abd 2010 showed coronary calcif, Treadmill was neg; Myoview 3/13 was neg,  wnl; last seen 4/15- no new symptoms, active w/ yard work, EKG showed wnl, they decided to do Treadmill (borderline ST depression), then Myoview 6/15 showed NEG- no ischemia, decr uptake inferiorly same as 2013, EF=69%, norm wall motion, low risk...    Hyperlipid> on Niacin500mg ; FLP 1/16 showed TChol 150, TG 101, HDL 45, LDL 85; continue same, get wt down...    DM> on Metform500Bid; wt=222#, BMI=29; Labs 7/16 showed BS=129, A1c=6.7; rec to continue same med, better diet, get wt down!..    GI- GERD, dysphagia, Divertics, IBS, Polyps> on Protonix40, Miralax, Levsin0.125 prn; followed by DrPerry & stable- last colon 5/12 w/ severe divertics & 2 sm adenomas removed...    GU- Kid stone, BPH> on Flomax0.4 & Cialis prn; PSA remains wnl (PSA 1/16= 1.25)...    DJD, LBP> left knee complaints w/ shots from DrDuda,  similar for left shoulder; hx bilat hip AVN noted on prev CT Abd but he has min complaints and uses Tylenol prn (holding off on surg)...    Anxiety> he does not want anxiolytic meds.[name] EXAM shows Afeb, VSS, O2sat=97%;  Heent- neg, mallampati2;  Chest- clear w/o w/r/r;  Heart- RR w/o m/r/g;  Abd- soft, neg, nontender;  Ext- neg w/o c/c/e;  Neuro- intact...  Labs 7/16>  Chems- ok w/ BS=129, A1c=6.7, Cr=0.83... IMP/PLAN>>  Jesse Santos is stable, meds refilled, he's already had the 2016 Flu vaccine & up to date;  We discussed diet/ exercise/ wt reduction... ROV in 50mo.  ~  October 15, 2015:  30mo ROV & Jesse Santos saw TP 07/2015 for a lesion on tongue- sent to Derm DrLupton ?lichenoid mucositis & may need referral to [place]... He has mult somatic complaints> LBP, leg weakness, HAs, cough/ congestion/ clear mucous (yet he states exercise on treadmill for 65min at 2.31mph)... We reviewed the following medical problems during today's office visit >>     HOH> he has bilat hearing aides...    AR> on allergy shots per DrVanWinkle, OTC antihist, Saline, Flonase; gets occas bouts of sinusitis but overall improved & no recent infections reported...     OSA> Sleep Study 2/04 w/ RDI=45 & desat to 88% w/ mod snoring & leg jerks, but he states he never could use the CPAP effectively & states he sleeps fine now if he stays on his side; denies daytime sleepiness or other issues...    COPD> exsmoker quit 1981; on AdvairHFA115-2spBid, Mucinex prn; he doesn't like Pred Rx; prev Bronchitic exac treated w/ Levaquin, Medrol, Mucinex & improved; needs incr exercise program...    HBP> on Aten25, Losar100; BP=126/70, he denies CP, palpit, dizzy, SOB, edema, etc...    CAD> on ASA81; followed by DrHochrein- CT Abd 2010 showed coronary calcif, Treadmill was neg; Myoview 3/13 was neg, wnl; last seen 4/15- no new symptoms, active w/ yard work, EKG showed wnl, they decided to do Treadmill (borderline ST depression), then Myoview 6/15 showed NEG- no  ischemia, decr uptake inferiorly same as 2013, EF=69%, norm wall motion, low risk...    Hyperlipid> on Niacin500mg ; FLP 1/17 showed TChol 132, TG 52, HDL 46, LDL 75; continue same, get wt down...    DM> on Metform500Bid; wt=220#, BMI=29; Labs 7/16 showed BS=129, A1c=6.7; rec to continue same med, better diet, get wt down; Labs 1/17 showed BS=134, A1c=6.8.Marland KitchenMarland Kitchen    GI- GERD, dysphagia, Divertics, IBS, Polyps> on Protonix40, Miralax, Levsin0.125 prn; followed by DrPerry & stable- last colon 5/12 w/ severe divertics & 2 sm adenomas removed...    GU- Kid stone, BPH>  on Flomax0.4 & Cialis prn; PSA remains wnl (PSA 1/16= 1.25, 1/17=1.06)...    DJD, LBP> left knee complaints w/ shots from DrDuda, similar for left shoulder; hx bilat hip AVN noted on prev CT Abd but he has min complaints and uses Tylenol prn (holding off on surg)...    Anxiety> he does not want anxiolytic meds.[name] EXAM shows Afeb, VSS, O2sat=98%;  Heent- neg, mallampati2;  Chest- clear w/o w/r/r;  Heart- RR w/o m/r/g;  Abd- soft, neg, nontender;  Ext- neg w/o c/c/e;  Neuro- intact...  LABS 10/15/15>  FLP- at goals on diet + niacin;  Chems- ok x BS=134, A1c=6.8 on Metform500bid;  CBC- wnl;  TSH=1.08;  PSA=1.06 IMP/PLAN>>  Jesse Santos has mult somatic complaints but objectively in good shape; we discussed ENT 2nd opinion re tongue lesion vs Med [facility] referral per Derm, he will decide; continue present Rx & ROV 52mo...         Problem List:  HEARING LOSS (ICD-389.9) - he has bilat hearing aides...  ALLERGIC RHINITIS (ICD-477.9) - on allergy shots from DrESL Gaetano Hawthorne)... plus Claritin, Saline, Mucinex, Flonase. ~  occas bouts of sinusitis requiring antibiotic Rx... ~  8/13: he had allergy f/u DrVanWinkle> skin testing 2010 pos to dust mites and molds; doing well on shots once/mo; stable & no changes made... ~  3/14: he presented w/ an upper resp infection & treated w/ Depo, Dosepak, Zithromax... ~  8/14:  He had allergy f/u DrVanWinkle> Advair, Flonase,  Saline, Mucinex; he checked FENO- reported normal... ~  7/15: stable on allergy meds from DrVanWinkle, plus AdvairHFA115; he had sinus/ AB exac 5/15 treated by TP w/ ZPak=> Levaquin & resolved...  OBSTRUCTIVE SLEEP APNEA (ICD-327.23) - sleep study 8/04 showed RDI= 45 & desat to 88%... mod snoring and leg jerks without sleep disruption... CPAP perscribed but not using it now- states he can't sleep w/ it on & furthermore he is resting well as long as he stays on his side... denies daytime hypersomnolence & not interested in re-evaluation.  COPD (ICD-496) - ex-smoker, quit 1981 >>  ~  on ADVAIR 100Bid, PROAIR Prn, MUCINEX Bid... he had Pneumovax in 2008... doesn't like Pred Rx...  ~  10/11:  doing well except recent cough, min green sputum (no change in dyspnea)- we will Rx w/ Augmentin, incr Mucinex/ Fluids. ~  4/12:  Breathing at baseline, continue maintenance meds... ~  CXR 12/13 showed normal heart size, clear lungs, NAD... ~  1/14:  He's had 2 exac this past yr- saw TP w/ Rx & improved... ~  1/15:  He continues to do well on Advair + allergy Rx from DrVanWinkle w/ Flonase, Saline, Mucinex... ~  7/15: exsmoker quit 1981; on AdvairHFA115-2spBid, Mucinex prn; he doesn't like Pred Rx; he had Bronchitic exac treated by TP 5/15 w/ ZPak, then Levaquin, Mucinex, etc & improved; needs incr exercise program... ~  10/15: seen w/ refractory bronchitic episode reqiring ZPak=>Levaquin, Medrol, Mucinex, Hydromet, etc... ~  1/16: He had a URI/ AB exac treated 10/15 w/ Levaquin/ Medrol/ Mucinex & resolved; now at baseline on Advair115-2spBid etc...  HYPERTENSION (ICD-401.9) - prev on diet alone> then on Diovan320, but we decided to change to LOSARTAN 100mg /d 5/13 to save $$ ~  He claims that Norvasc & HCTZ "stopped my urine flow"... ~  10/11:  BP= 128/80 & even better at home he says... denies HA, fatigue, visual changes, CP, palipit, dizziness, syncope, dyspnea, edema, etc... ~  4/12:  Pt has seen TP,  DrHochrein, & checking BP  daily at home; tried on Norvasc but claims it decr his urine flow; now on Diovan & improved w/ BP= 132/70... ~  5/12:  BP remains well controlled on Diovan 320mg /d= 120/62 today. ~  11/12:  BP= 142/82 on Diovan160 now; denies CP, palpit, dizzy, SOB, edema... ~  5/13:  BP= 128/66 & he's requesting change to cheaper med; rec switch Diovan to LOSARTAN 100mg /d... ~  1/14: on Losar100; BP=140/84, he notes incr HR he says & we decided to add low dose ATENOLOL25mg /d... ~  7/14: on Aten25, Losar100> BP=128/72 & he denies CP, palpit, SOB, edema, etc... ~  1/15: BP controlled on Aten25, Losar100 w/ BP= 130/80 & he denies CP, palpit, SOB, edema. ~  7/15: on Aten25, Losar100; BP=124/70, he denies CP, palpit, dizzy, SOB, edema, etc. ~  10/15: on Aten25, Losar100; BP= 140/60 & remains stable hemodynamically... ~  BP remains under good control on same meds...  R/O CAD (ICD-414.00) - on ASA 81mg /d... Followed by DrHochrein & his notes are reviewed. ~  hx neg cardiac [place]... ~  NuclearStressTest 10/02 was norm- no scar or ischemia, EF=55%. ~  CT Abd 4/10 via ER showed coronary calcif... referred to Cards. ~  Eval by DrHochrein 6/10 showed norm EKG & cardiac exam... treadmill ordered. ~  Treadmill 6/10 showed reasonable exerc tolerance, no ischemic changes, few PVCs, sl incr BP... ~  EKG 3/13 showed NSR, rate76, WNL, NAD... ~  Myoview 3/13 was neg> exercised for 11min, stopped for fatigue & CP; no ST seg changes, occas PACs & PVCs; hypertensive BP response; no ischemia, EF=71%, normal wall motion... ~  He saw DrHochrein 4/14> hx CAD, doing satis on ASA & BP rx w/o angina & rec to follow program of aggressive risk factor reduction, no change in meds.  ~  EKG 4/14 showed NSR, rate60, wnl, NAD.. ~  6/15: .on ASA81; followed by DrHochrein- last seen 4/15- no new symptoms, active w/ yard work, EKG showed wnl, they decided to do Treadmill (borderline ST depression), then  Myoview 6/15 showed NEG- no ischemia, decr uptake inferiorly same as 2013, EF=69%, norm wall motion, low risk...  MIXED HYPERLIPIDEMIA (ICD-272.2) - low HDL and NIASPAN 500mg /d started 4/09... ~  [place] 4/09 showed TChol 160, TG 106, HDL 26, LDL 113... rec- diet + Niaspan 500/d. ~  FLP 10/09 showed TChol 155, TG 78, HDL 33, LDL 106... ~  FLP 4/10 showed TChol 133, TG 63, HDL 30, LDL 91... rec> continue same. ~  FLP 4/11 showed TChol 144, TG 61, HDL 38, LDL 94 ~  FLP 10/11 showed TChol 155, TG 81, HDL 40, LDL 99 ~  FLP 4/12 showed TChol 142, TG 69, HDL 38, LDL 90 ~  FLP 11/12 showed TChol 165, TG 82, HDL 43, LDL 106 ~  FLP 5/13 in Niasp500 showed TChol 155, TG 72, HDL 46, LDL 95... rec change to generic. ~  FLP 1/14 on Niacin500 showed TChol 140, TG 56, HDL 39, LDL 90 ~  FLP 1/15 on Niaspan500 showed TChol 148, TG 57, HDL 43, LDL 94  ~  FLP 1/16 on Niaspan500 showed TChol 150, TG 101, HDL 45, LDL 85 ~  FLP 1/17 on Niaspan500 showed TChol 132, TG 52, HDL 46, LDL 75  DM (ICD-250.00) - on diet + METFORMIN 500mg Bid... he reports BS at home all in the 120-130's... ~  labs 4/09 showed BS= 132, HgA1c= 6.6.Marland KitchenMarland Kitchen rec- same med, better diet... ~  labs 10/09 showed BS= 131, HgA1c= 6.3.Marland KitchenMarland Kitchen ~  labs 4/10 showed BS= 117, HgA1c= 6.2.[name]. rec> same meds/ diet Rx. ~  labs 10/10 showed BS= 138, A1c= 6.5 ~  labs 4/11 showed BS= 121, A1c= 6.5.Marland KitchenMarland Kitchen On Metform500Bid + diet. ~  Dilated eye exam 5/11 by DrShapiro- no retinopathy... ~  labs 10/11 (wt=228#) showed BS= 118, A1c= 7.1.[name]. not as good- get on diet or more meds. ~  Labs 4/12 (wt=229#) showed BS= 119, A1c= 7.3... Wrong direction, may need more meds, get wt down! ~  5/12:  Ophthalmology check by DrShapiro was neg- no retinopathy... ~  Labs 11/12 showed BS= 136, A1c= 7.0 ~  Labs 5/13 on MetformBid showed BS= 132, A1c= 7.1.Marland KitchenMarland Kitchen Continue same, get wt down. ~  Ophthalmology check from DrShapiro 5/13> no DM retinopathy... ~  Labs 1/14 showed BS= 134, A1c= 7.5; not as good-  needs better diet, get wt down, same meds for now... ~  Labs 7/14 on Metform500Bid showed BS= 140, A1c= 7.3  ~  Labs 1/154 on Metform500Bid showed BS= 129 & A1c= 7.0, and we reviewed meds/ diet/ exercise/ weight... ~  Labs 7/15 on Metform500Bid showed BS= 126, A1c= 7.1 & reminded of need for diet/ exerc/ wt reduction... ~  Labs 1/16 on Metform500Bid showed BS= 133, A1c= 7.2 ~  Labs 1/17 on Metform500Bid showed BS= 134, A1c= 6.8  GERD (ICD-530.81) & ? GASTROPARESIS - on PROTONIX 40mg /d, & off Reglan per DrPerry... ~   last EGD 6/07 by DrPerry showed gastic polyp... ~  8/10:  given Compazine suppos for gastroparesis flair by DrGessner. ~  4/12:  C/o intermittent dysphagia & referred back to GI for further eval (colonoscopy due as well)...  DIVERTICULOSIS OF COLON (ICD-562.10) - he uses MIRALAX Bid regularly... IRRITABLE BOWEL SYNDROME (ICD-564.1) COLONIC POLYPS (ICD-211.3) ~  last colonoscopy 5/07 by DrSam showed divertics only... f/u 31yrs. ~  CT Abd 4/10 in ER showed atx bases, coronary calcif, s/p GB, abdAo calcif, 54mm renal stone, divertics, bilat hip AVN. ~  Follow up colonoscopy by DrPerry 5/12 showed severe diverticulosis, 2 sm polyps= tubular adenoma & f/u planned 5 yrs...  RENAL CALCULUS, HX OF (ICD-V13.01) BENIGN PROSTATIC HYPERTROPHY, HX OF (ICD-V13.8) - on FLOMAX 0.4mg /d w/ improved symptoms (he states flow better since stopping Vit E supplement). ~  4/12:  Routine PSA= 4.9 (it was 1.02 4/11) & we rx w/ Doxy Bid x14d, thewn plan recheck PSA==> 1.30  DEGENERATIVE JOINT DISEASE (ICD-715.90) - pt states "I have a bum knee" and eval by DrDuda w/ viscosupplementation shots... Note: Abd CT w/ bilat hip AVN noted... takes TYLENOL Prn... ~  11/10:  s/p left knee arthroscopy by DrDuda... ~  10/11:  s/p fall w/ left knee injury- s/p cortisone shot by DrDuda; hx shot in left shoulder too. ~  7/14: he is c/o incr pain in left hip area; known hx AVN & needs f/u by Ortho...  BACK PAIN, LUMBAR  (ICD-724.2)  ANXIETY (ICD-300.00) - not currently on meds for nerves.  ANEMIA (ICD-285.9) - prev hx of GIB... ~  labs 4/09 showed Hg= 14.1 ~  labs 4/10 showed Hg= 13.6 ~  labs 4/11 showed Hg= 13.4 ~  Labs 4/12 showed Hg= 13.8 ~  Labs 3/13 showed Hg= 13.1 ~  Labs 1/14 showed Hg= 13.6 ~  Labs 1/15 showed Hg= 13.7 ~  Labs 1/16 showed Hg= 14.1 ~  CBC remains WNL...  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  Health Maintenance: ~  GI: followed by DrPerry> Colonoscopy 5/07 by DrSam... f/u planned  5 yrs. ~  GU:  See PSAs recorded above... ~  Immunizations: Pneumovax in 2008 @ age 57;  [place] given 7/15; Tetanus- given 4/10;  he gets yearly seasonal Flu vaccine.   Past Surgical History  Procedure Laterality Date  . Cholecystectomy    . Cataract extraction    . Skin cancer excision  3/10    left hand   . Knee surgery  11/10    Outpatient Encounter Prescriptions as of 10/15/2015  Medication Sig  . acetaminophen (TYLENOL) 500 MG tablet Take 500 mg by mouth every 6 (six) hours as needed.  [name] aspirin 81 MG tablet Take 81 mg by mouth daily.    [name] atenolol (TENORMIN) 25 MG tablet Take 1 tablet (25 mg total) by mouth daily.  . Cholecalciferol (VITAMIN D) 2000 UNITS CAPS Take 1 capsule by mouth daily.    . COMPRO 25 MG suppository PLACE 1 SUPPOSITORY (25 MG TOTAL) RECTALLY EVERY 12 (TWELVE) HOURS AS NEEDED FOR NAUSEA.  [name] Desoximetasone 0.05 % GEL APPLY TO AFFECTED AREA TWICE A DAY  . dextromethorphan-guaiFENesin (MUCINEX DM) 30-600 MG per 12 hr tablet Take 1 tablet by mouth every 12 (twelve) hours.  . fluticasone (FLONASE) 50 MCG/ACT nasal spray Place 2 sprays into both nostrils daily.  . fluticasone-salmeterol (ADVAIR HFA) 115-21 MCG/ACT inhaler Inhale 1 puff into the lungs 2 (two) times daily.  [name] glucose blood (ONE TOUCH ULTRA TEST) test strip TEST BLOOD SUGAR ONCE DAILY AS DIRECTED  . hyoscyamine (LEVSIN SL) 0.125 MG SL tablet Place 1 tablet (0.125 mg total) under the  tongue every 4 (four) hours as needed. As needed for abdominal cramping  . lidocaine (ASPERCREME W/LIDOCAINE) 4 % cream Apply 1 application topically as needed.  . loratadine (CLARITIN) 10 MG tablet Take 10 mg by mouth daily as needed for allergies.  [name] losartan (COZAAR) 100 MG tablet TAKE 1 TABLET (100 MG TOTAL) BY MOUTH DAILY.  . metFORMIN (GLUCOPHAGE) 500 MG tablet TAKE 1 TABLET TWICE A DAY WITH A MEAL  . Multiple Vitamins-Minerals (MULTIVITAMIN WITH MINERALS) tablet Take 1 tablet by mouth daily.    . niacin (NIASPAN) 500 MG CR tablet TAKE 1 TABLET BY MOUTH AT BEDTIME  . nitroGLYCERIN (NITROSTAT) 0.4 MG SL tablet Place 1 tablet (0.4 mg total) under the tongue every 5 (five) minutes as needed for chest pain.  . pantoprazole (PROTONIX) 40 MG tablet TAKE 1 TABLET BY MOUTH EVERY DAY  . polyethylene glycol powder (GLYCOLAX/MIRALAX) powder TAKE 17 GRAMS BY MOUTH TWICE A DAY AS DIRECTED  . sodium chloride (OCEAN) 0.65 % nasal spray 1 spray by Nasal route as needed.    . tamsulosin (FLOMAX) 0.4 MG CAPS capsule Take 1 capsule (0.4 mg total) by mouth 2 (two) times daily.  . traMADol (ULTRAM) 50 MG tablet TAKE 1 TABLET THREE TIMES A DAY AS NEEDED FOR PAIN  . [DISCONTINUED] fluticasone (FLONASE) 50 MCG/ACT nasal spray Place 2 sprays into both nostrils daily.  . [DISCONTINUED] fluticasone-salmeterol (ADVAIR HFA) 115-21 MCG/ACT inhaler Inhale 1 puff into the lungs 2 (two) times daily.  . [DISCONTINUED] tamsulosin (FLOMAX) 0.4 MG CAPS capsule TAKE ONE CAPSULE BY MOUTH TWICE A DAY  . [DISCONTINUED] traMADol (ULTRAM) 50 MG tablet TAKE 1 TABLET THREE TIMES A DAY AS NEEDED FOR PAIN  . [DISCONTINUED] azithromycin (ZITHROMAX Z-PAK) 250 MG tablet Use as directed  . [DISCONTINUED] levofloxacin (LEVAQUIN) 750 MG tablet Take 1 tablet (750 mg total) by mouth daily.  . [DISCONTINUED] predniSONE (STERAPRED UNI-PAK 21 TAB) 10 MG (21)  TBPK tablet Take 1 tablet (10 mg total) by mouth daily.  . [DISCONTINUED] Probiotic  Product (PROBIOTIC DAILY PO) Take by mouth daily.  . [DISCONTINUED] sucralfate (CARAFATE) 1 GM/10ML suspension Take 1 gram before meals. (Patient taking differently: Take 1 gram before meals as needed)   No facility-administered encounter medications on file as of 10/15/2015.    Allergies  Allergen Reactions  . Prednisone     REACTION: high dose intolerance    Immunization History  Administered Date(s) Administered  . H1N1 08/21/2008  . Influenza Split 06/01/2011, 05/26/2012  . Influenza Whole 06/06/2008, 06/21/2009, 06/06/2010  . Influenza,inj,Quad PF,36+ Mos 06/07/2013, 07/09/2014, 06/10/2015  . Pneumococcal Conjugate-13 04/05/2014  . Pneumococcal Polysaccharide-23 06/30/2006  . Zoster 09/14/2010    Current Medications, Allergies, Past Medical History, Past Surgical History, Family History, and Social History were reviewed in [employer] record.    Review of Systems         See HPI - all other systems neg except as noted... The patient complains of decreased hearing, dyspnea on exertion, headaches, and difficulty walking.  The patient denies anorexia, fever, weight loss, weight gain, vision loss, hoarseness, chest pain, syncope, peripheral edema, prolonged cough, hemoptysis, abdominal pain, melena, hematochezia, severe indigestion/heartburn, hematuria, incontinence, muscle weakness, suspicious skin lesions, transient blindness, depression, unusual weight change, abnormal bleeding, enlarged lymph nodes, and angioedema.     Objective:   Physical Exam      WD, WN, 80 y/o WM in NAD... GENERAL:  Alert & oriented; pleasant & cooperative... HEENT:  [place]/AT, EOM-full, PERRLA, EACs-clear, TMs-wnl, NOSE- pale, congested, THROAT-clear & wnl. NECK:  Supple w/ fairROM; no JVD; normal carotid impulses w/o bruits; no thyromegaly or nodules palpated; no lymphadenopathy. CHEST:  Clear to P & A w/o wheezing, rhonchi, rales, or signs of consolidation... HEART:  Regular  Rhythm; without murmurs/ rubs/ or gallops detected...  ABDOMEN:  Soft & nontender; normal bowel sounds; no organomegaly or masses palpated., no guarding, or rebound EXT: without deformities, mild arthritic changes; no varicose veins/ venous insuffic/ or edema. NEURO:  CN's intact;  no focal neuro deficits... DERM:  No lesions noted; no rash etc...  RADIOLOGY DATA:  Reviewed in the EPIC EMR & discussed w/ the patient...  LABORATORY DATA:  Reviewed in the EPIC EMR & discussed w/ the patient...   Assessment & Plan:    Lesion on tongue>. ?etiology, "lichenoid mucositis" and he is referred to ENT for 2nd opinion...   Hx Acute Bronchitis> presented 10/15 w/ refractory bronchitic episode; given ZPak=>Levaquin, Depo80/ Medrol dosepak, Mucinex, Hydromet, etc=> resolved... COPD exac>  He continues on Advair, Proair rescue, Mucinex; also Claritin, Flonase, Saline; stable continue same Rx...  HBP>  On LOSARTAN 100mg /d & [id number] w/ good control of BP, continue same...  R/O CAD>  Followed by DrHochrein, and doing satis w/ neg Myoview 6/15... continue ASA & risk factor reduction....  Lipids>  Looks satis on his diet + Niacin...  DM>  Control is fair w/ A1c stable at 7.1; he was told there are more meds in his future if he doesn't get his wt down (currently~230#)...  GI>  Per DrPerry w/ GERD, ?Gastroparesis, Divertics, IBS, Colon polyps> f/u colon 5/12 w/ divertics & 2 sm adenomas, repeat planned ~55yrs...  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  Anxiety>  Certainly an issue, he does not want anxiolytic Rx...   Patient's Medications  New Prescriptions   No medications on file  Previous Medications   ACETAMINOPHEN (TYLENOL) 500 MG TABLET    Take 500 mg by mouth every 6 (six) hours as needed.   ASPIRIN 81 MG TABLET    Take 81 mg by mouth daily.     ATENOLOL (TENORMIN) 25 MG TABLET    Take 1 tablet (25 mg total) by mouth daily.    CHOLECALCIFEROL (VITAMIN D) 2000 UNITS CAPS    Take 1 capsule by mouth daily.     COMPRO 25 MG SUPPOSITORY    PLACE 1 SUPPOSITORY (25 MG TOTAL) RECTALLY EVERY 12 (TWELVE) HOURS AS NEEDED FOR NAUSEA.   DESOXIMETASONE 0.05 % GEL    APPLY TO AFFECTED AREA TWICE A DAY   DEXTROMETHORPHAN-GUAIFENESIN (MUCINEX DM) 30-600 MG PER 12 HR TABLET    Take 1 tablet by mouth every 12 (twelve) hours.   GLUCOSE BLOOD (ONE TOUCH ULTRA TEST) TEST STRIP    TEST BLOOD SUGAR ONCE DAILY AS DIRECTED   HYOSCYAMINE (LEVSIN SL) 0.125 MG SL TABLET    Place 1 tablet (0.125 mg total) under the tongue every 4 (four) hours as needed. As needed for abdominal cramping   LIDOCAINE (ASPERCREME W/LIDOCAINE) 4 % CREAM    Apply 1 application topically as needed.   LORATADINE (CLARITIN) 10 MG TABLET    Take 10 mg by mouth daily as needed for allergies.   LOSARTAN (COZAAR) 100 MG TABLET    TAKE 1 TABLET (100 MG TOTAL) BY MOUTH DAILY.   METFORMIN (GLUCOPHAGE) 500 MG TABLET    TAKE 1 TABLET TWICE A DAY WITH A MEAL   MULTIPLE VITAMINS-MINERALS (MULTIVITAMIN WITH MINERALS) TABLET    Take 1 tablet by mouth daily.     NIACIN (NIASPAN) 500 MG CR TABLET    TAKE 1 TABLET BY MOUTH AT BEDTIME   NITROGLYCERIN (NITROSTAT) 0.4 MG SL TABLET    Place 1 tablet (0.4 mg total) under the tongue every 5 (five) minutes as needed for chest pain.   PANTOPRAZOLE (PROTONIX) 40 MG TABLET    TAKE 1 TABLET BY MOUTH EVERY DAY   POLYETHYLENE GLYCOL POWDER (GLYCOLAX/MIRALAX) POWDER    TAKE 17 GRAMS BY MOUTH TWICE A DAY AS DIRECTED   SODIUM CHLORIDE (OCEAN) 0.65 % NASAL SPRAY    1 spray by Nasal route as needed.    Modified Medications   Modified Medication Previous Medication   FLUTICASONE (FLONASE) 50 MCG/ACT NASAL SPRAY fluticasone (FLONASE) 50 MCG/ACT nasal spray      Place 2 sprays into both nostrils daily.    Place 2 sprays into both nostrils daily.   FLUTICASONE-SALMETEROL (ADVAIR HFA) 115-21 MCG/ACT INHALER fluticasone-salmeterol (ADVAIR HFA) 115-21 MCG/ACT  inhaler      Inhale 1 puff into the lungs 2 (two) times daily.    Inhale 1 puff into the lungs 2 (two) times daily.   TAMSULOSIN (FLOMAX) 0.4 MG CAPS CAPSULE tamsulosin (FLOMAX) 0.4 MG CAPS capsule      Take 1 capsule (0.4 mg total) by mouth 2 (two) times daily.    TAKE ONE CAPSULE BY MOUTH TWICE A DAY   TRAMADOL (ULTRAM) 50 MG TABLET traMADol (ULTRAM) 50 MG tablet      TAKE 1 TABLET THREE TIMES A DAY AS NEEDED FOR PAIN    TAKE 1 TABLET THREE TIMES A DAY AS NEEDED FOR PAIN  Discontinued Medications   AZITHROMYCIN (ZITHROMAX Z-PAK) 250 MG TABLET    Use as directed   LEVOFLOXACIN (LEVAQUIN) 750 MG TABLET    Take 1 tablet (750 mg total) by mouth  daily.   PREDNISONE (STERAPRED UNI-PAK 21 TAB) 10 MG (21) TBPK TABLET    Take 1 tablet (10 mg total) by mouth daily.   PROBIOTIC PRODUCT (PROBIOTIC DAILY PO)    Take by mouth daily.   SUCRALFATE (CARAFATE) 1 GM/10ML SUSPENSION    Take 1 gram before meals.

## 2015-10-29 DIAGNOSIS — H9313 Tinnitus, bilateral: Secondary | ICD-10-CM | POA: Diagnosis not present

## 2015-10-29 DIAGNOSIS — Z974 Presence of external hearing-aid: Secondary | ICD-10-CM | POA: Diagnosis not present

## 2015-10-29 DIAGNOSIS — K148 Other diseases of tongue: Secondary | ICD-10-CM | POA: Diagnosis not present

## 2015-10-29 DIAGNOSIS — J342 Deviated nasal septum: Secondary | ICD-10-CM | POA: Diagnosis not present

## 2015-10-29 DIAGNOSIS — H903 Sensorineural hearing loss, bilateral: Secondary | ICD-10-CM | POA: Diagnosis not present

## 2015-10-31 ENCOUNTER — Telehealth: Payer: Self-pay | Admitting: Pulmonary Disease

## 2015-10-31 ENCOUNTER — Ambulatory Visit: Payer: PRIVATE HEALTH INSURANCE | Admitting: Pulmonary Disease

## 2015-10-31 MED ORDER — PREDNISONE 5 MG (21) PO TBPK: ORAL_TABLET | ORAL | Status: DC

## 2015-10-31 MED ORDER — AZITHROMYCIN 250 MG PO TABS: ORAL_TABLET | ORAL | Status: DC

## 2015-10-31 NOTE — Telephone Encounter (Signed)
Pt just seen 1.31.17 for CPX, coming in today for sick visit  Per SN: please call pt and check his symptoms.  We may be able to call something in, thanks.  Called spoke with patient who c/o low grade fever x3 days with runny nose w/ yellow mucus, some left ear discomfort (he did see ENT on 2/14 and was told ears were fine) and some stomach discomfort with loose stools x6 yesterday (not diarrhea per pt).  Advised pt will speak with SN and call him back.  Per SN: okay for zpak, Align otc, immodium otc, and a pred pak.  Plenty of fluids.  Call the office if symptoms do not improve or they worsen.  Called spoke with patient again, and discussed SN's recs as stated above.  Pt voiced his understanding and requested rx's be sent to [place]. Rx's sent OV cancelled Nothing further needed; will sign off

## 2015-11-18 ENCOUNTER — Telehealth: Payer: Self-pay | Admitting: Pulmonary Disease

## 2015-11-18 MED ORDER — LOSARTAN POTASSIUM 100 MG PO TABS: ORAL_TABLET | ORAL | Status: DC

## 2015-11-18 NOTE — Telephone Encounter (Signed)
Spoke with pt, needing refill on losartan. This has been sent to preferred pharmacy.  Nothing further needed.

## 2015-11-18 NOTE — Telephone Encounter (Signed)
Pharmacy changed in patient's chart. No refills needed at this time. Spoke with pt's wife and advised her that information has been added to chart. Nothing further needed.

## 2015-11-20 DIAGNOSIS — L570 Actinic keratosis: Secondary | ICD-10-CM | POA: Diagnosis not present

## 2015-11-20 DIAGNOSIS — D485 Neoplasm of uncertain behavior of skin: Secondary | ICD-10-CM | POA: Diagnosis not present

## 2015-12-19 ENCOUNTER — Encounter: Payer: Self-pay | Admitting: Cardiology

## 2015-12-19 ENCOUNTER — Ambulatory Visit (INDEPENDENT_AMBULATORY_CARE_PROVIDER_SITE_OTHER): Payer: Medicare Other | Admitting: Cardiology

## 2015-12-19 VITALS — BP 116/62 | HR 66 | Ht 73.0 in | Wt 219.0 lb

## 2015-12-19 DIAGNOSIS — I10 Essential (primary) hypertension: Secondary | ICD-10-CM

## 2015-12-19 DIAGNOSIS — I2510 Atherosclerotic heart disease of native coronary artery without angina pectoris: Secondary | ICD-10-CM | POA: Diagnosis not present

## 2015-12-19 NOTE — Patient Instructions (Signed)
NO CHANGES WITH CURRENT MEDICATIONS  Your physician wants you to follow-up in 18 MONTHS WITH CURRENT MEDICATIONS.  You will receive a reminder letter in the mail two months in advance. If you don't receive a letter, please call our office to schedule the follow-up appointment.  If you need a refill on your cardiac medications before your next appointment, please call your pharmacy.

## 2015-12-19 NOTE — Progress Notes (Signed)
HPI The patient presents for  followup of coronary calcification and he did have chest pain and stress testing in 2013. T here was no evidence of ischemia.  He had repeat Lexiscan Myoview last in June of 2015 which was negative for ischemia.   Since then he has had no new cardiovascular symptoms. He works very active in the yard and walks a treadmill. He denies any symptoms such as chest discomfort.  There has been no new shortness of breath, PND or orthopnea. There have been no reported palpitations, presyncope or syncope.    Allergies  Allergen Reactions  . Prednisone     REACTION: high dose intolerance    Current Outpatient Prescriptions  Medication Sig Dispense Refill  . acetaminophen (TYLENOL) 500 MG tablet Take 500 mg by mouth every 6 (six) hours as needed.    [name] aspirin 81 MG tablet Take 81 mg by mouth daily.      [name] atenolol (TENORMIN) 25 MG tablet Take 1 tablet (25 mg total) by mouth daily. 30 tablet 11  . Cholecalciferol (VITAMIN D) 2000 UNITS CAPS Take 1 capsule by mouth daily.      . COMPRO 25 MG suppository PLACE 1 SUPPOSITORY (25 MG TOTAL) RECTALLY EVERY 12 (TWELVE) HOURS AS NEEDED FOR NAUSEA. 12 suppository 0  . Desoximetasone 0.05 % GEL APPLY TO AFFECTED AREA TWICE A DAY  1  . dextromethorphan-guaiFENesin (MUCINEX DM) 30-600 MG per 12 hr tablet Take 1 tablet by mouth every 12 (twelve) hours.    . fluticasone (FLONASE) 50 MCG/ACT nasal spray Place 2 sprays into both nostrils daily. 16 g 11  . fluticasone-salmeterol (ADVAIR HFA) 115-21 MCG/ACT inhaler Inhale 1 puff into the lungs 2 (two) times daily. 1 Inhaler 11  . glucose blood (ONE TOUCH ULTRA TEST) test strip TEST BLOOD SUGAR ONCE DAILY AS DIRECTED 100 each 4  . hyoscyamine (LEVSIN SL) 0.125 MG SL tablet Place 1 tablet (0.125 mg total) under the tongue every 4 (four) hours as needed. As needed for abdominal cramping 90 tablet 5  . lidocaine (ASPERCREME W/LIDOCAINE) 4 % cream Apply 1 application topically as needed.    .  loratadine (CLARITIN) 10 MG tablet Take 10 mg by mouth daily as needed for allergies.    [name] losartan (COZAAR) 100 MG tablet TAKE 1 TABLET (100 MG TOTAL) BY MOUTH DAILY. 90 tablet 1  . metFORMIN (GLUCOPHAGE) 500 MG tablet TAKE 1 TABLET TWICE A DAY WITH A MEAL 60 tablet 11  . Multiple Vitamins-Minerals (MULTIVITAMIN WITH MINERALS) tablet Take 1 tablet by mouth daily.      . niacin (NIASPAN) 500 MG CR tablet TAKE 1 TABLET BY MOUTH AT BEDTIME 30 tablet 11  . nitroGLYCERIN (NITROSTAT) 0.4 MG SL tablet Place 1 tablet (0.4 mg total) under the tongue every 5 (five) minutes as needed for chest pain. 30 tablet 11  . pantoprazole (PROTONIX) 40 MG tablet TAKE 1 TABLET BY MOUTH EVERY DAY 30 tablet 11  . polyethylene glycol powder (GLYCOLAX/MIRALAX) powder TAKE 17 GRAMS BY MOUTH TWICE A DAY AS DIRECTED 1054 g 11  . sodium chloride (OCEAN) 0.65 % nasal spray 1 spray by Nasal route as needed.      . tamsulosin (FLOMAX) 0.4 MG CAPS capsule Take 1 capsule (0.4 mg total) by mouth 2 (two) times daily. 60 capsule 5  . traMADol (ULTRAM) 50 MG tablet TAKE 1 TABLET THREE TIMES A DAY AS NEEDED FOR PAIN 90 tablet 5   No current facility-administered medications for this visit.  Past Medical History  Diagnosis Date  . Hearing loss   . Allergic rhinitis, cause unspecified   . Obstructive sleep apnea (adult) (pediatric)   . COPD (chronic obstructive pulmonary disease) ([place])   . Unspecified essential hypertension   . Mixed hyperlipidemia   . Coronary artery disease     Coronary calcification with negative ETT 2010;   ETT-Myoview (02/2014):  Normal stress nuclear study. No evidence of ischemia.  LV Ejection Fraction: 69%  . Type II or unspecified type diabetes mellitus without mention of complication, not stated as uncontrolled   . Esophageal reflux   . Diverticulosis of colon (without mention of hemorrhage)   . Irritable bowel syndrome   . Benign neoplasm of colon   . Renal calculus   . BPH (benign prostatic  hypertrophy)   . Degenerative joint disease   . Lumbar back pain   . Anxiety   . Anemia   . Hyperplastic colon polyp 02/12/2011    Past Surgical History  Procedure Laterality Date  . Cholecystectomy    . Cataract extraction    . Skin cancer excision  3/10    left hand   . Knee surgery  11/10    ROS:  As stated in the HPI and negative for all other systems.  PHYSICAL EXAM BP 116/62 mmHg  Pulse 66  Ht 6\' 1"  (1.854 m)  Wt 219 lb (99.338 kg)  BMI 28.90 kg/m2 GENERAL:  Well appearing NECK:  No jugular venous distention, waveform within normal limits, carotid upstroke brisk and symmetric, no bruits, no thyromegaly LUNGS:  Clear to auscultation bilaterally HEART:  PMI not displaced or sustained,S1 and S2 within normal limits, no S3, no S4, no clicks, no rubs, no murmurs ABD:  Flat, positive bowel sounds normal in frequency in pitch, no bruits, no rebound, no guarding, no midline pulsatile mass, no hepatomegaly, no splenomegaly EXT:  2 plus pulses throughout, no edema, no cyanosis no clubbing  EKG:  NSR, rate 66, axis within normal limits, intervals within normal limits, no acute ST-T wave changes. 12/19/2015   Lab Results  Component Value Date   HGBA1C 6.8* 10/15/2015   Lab Results  Component Value Date   CHOL 132 10/15/2015   TRIG 52.0 10/15/2015   HDL 46.10 10/15/2015   [place] 75 10/15/2015    ASSESSMENT AND PLAN  CAD:  The patient has no new sypmtoms. No further testing is due.  He does a great job with risk reduction.    HTN:  The blood pressure is at target. No change in medications is indicated. We will continue with therapeutic lifestyle changes (TLC).  DYSLIPIDEMIA:  His LDL was 75 with and HDL of 46.1.  No change in therapy is indicated.

## 2016-01-01 DIAGNOSIS — D485 Neoplasm of uncertain behavior of skin: Secondary | ICD-10-CM | POA: Diagnosis not present

## 2016-01-27 ENCOUNTER — Telehealth: Payer: Self-pay | Admitting: Pulmonary Disease

## 2016-01-27 MED ORDER — GLUCOSE BLOOD VI STRP: ORAL_STRIP | Status: DC

## 2016-01-27 MED ORDER — AZITHROMYCIN 250 MG PO TABS: ORAL_TABLET | ORAL | Status: DC

## 2016-01-27 NOTE — Telephone Encounter (Signed)
Attempted to contact patient, rang busy, will call back.

## 2016-01-27 NOTE — Telephone Encounter (Signed)
Patient called back returning our call - He can be reached at [phone]

## 2016-01-27 NOTE — Telephone Encounter (Signed)
z-pak 

## 2016-01-27 NOTE — Telephone Encounter (Signed)
Spoke with the pt  He is c/o sinus drainage and prod cough with yellow sputum x 3 days  He denies any SOB, wheezing, fever He is requesting abx  I offered appt but he refused unless doc on call thinks this is necc  SN out of the office this wk  Please advise, thanks! Allergies  Allergen Reactions  . Prednisone     REACTION: high dose intolerance

## 2016-01-27 NOTE — Telephone Encounter (Signed)
Spoke with pt and advised of recommendations. Pt agreed. Rx sent. Nothing further needed.

## 2016-02-03 ENCOUNTER — Ambulatory Visit (INDEPENDENT_AMBULATORY_CARE_PROVIDER_SITE_OTHER): Payer: Medicare Other | Admitting: Adult Health

## 2016-02-03 ENCOUNTER — Encounter: Payer: Self-pay | Admitting: Adult Health

## 2016-02-03 VITALS — BP 146/72 | HR 83 | Temp 98.8°F | Ht 73.0 in | Wt 221.0 lb

## 2016-02-03 DIAGNOSIS — I2510 Atherosclerotic heart disease of native coronary artery without angina pectoris: Secondary | ICD-10-CM | POA: Diagnosis not present

## 2016-02-03 DIAGNOSIS — J449 Chronic obstructive pulmonary disease, unspecified: Secondary | ICD-10-CM | POA: Diagnosis not present

## 2016-02-03 MED ORDER — AMOXICILLIN-POT CLAVULANATE 875-125 MG PO TABS: 1.0000 | ORAL_TABLET | Freq: Two times a day (BID) | ORAL | Status: AC

## 2016-02-03 MED ORDER — METHYLPREDNISOLONE ACETATE 80 MG/ML IJ SUSP
80.0000 mg | Freq: Once | INTRAMUSCULAR | Status: AC
  Administered 2016-02-03: 80 mg via INTRAMUSCULAR

## 2016-02-03 NOTE — Patient Instructions (Addendum)
Augmentin 875mg  Twice daily  For 7 days  Depo medrol shot 80mg  IM  Mucinex DM Twice daily  As needed  Cough/congestion .  Fluids and rest .  Tylenol As needed   Please contact office for sooner follow up if symptoms do not improve or worsen or seek emergency care  Follow up Dr. Lenna Gilford  As planned and As needed

## 2016-02-03 NOTE — Assessment & Plan Note (Signed)
Flare with bronchitis -slow to resolve Depo Medrol 80g IM x 1   Plan  Augmentin 875mg  Twice daily  For 7 days  Depo medrol shot 80mg  IM  Mucinex DM Twice daily  As needed  Cough/congestion .  Fluids and rest .  Tylenol As needed   Please contact office for sooner follow up if symptoms do not improve or worsen or seek emergency care  Follow up Dr. Lenna Gilford  As planned and As needed

## 2016-02-03 NOTE — Progress Notes (Signed)
Subjective:    Patient ID: Jesse Santos, male    DOB: April 09, 1936, 80 y.o.   MRN: [id number]  HPI 80 yo male, former smoker ,  with multiple medical problems including HTN, COPD , DM and dyslipdemia Followed by Dr. Lenna Gilford  For primary care .    02/03/2016 Acute OV Presents for an acute office visit.  Complains of 1 week of chest congestion/tightness, sinus pressure/drainage, bodyaches, prod cough with yellow mucus, and wheezing . Called in [place] on 5/15. Still has lingering symptoms. Was feeling better but got worse yesterday. Increased nasal congestion , coughing up yellow mucus , sinus pressure, Coughing a lot of night , keeping him up. Took tylenol for aches last night. Has ear pressure.  . Denies any SOB, fever, nausea or vomiting. , hemoptysis, edmea or chest pain.  Good appetite .  Remains on Advair and claritin. And flonase     Past Medical History  Diagnosis Date  . Hearing loss   . Allergic rhinitis, cause unspecified   . Obstructive sleep apnea (adult) (pediatric)   . COPD (chronic obstructive pulmonary disease) ([place])   . Unspecified essential hypertension   . Mixed hyperlipidemia   . Coronary artery disease     Coronary calcification with negative ETT 2010;   ETT-Myoview (02/2014):  Normal stress nuclear study. No evidence of ischemia.  LV Ejection Fraction: 69%  . Type II or unspecified type diabetes mellitus without mention of complication, not stated as uncontrolled   . Esophageal reflux   . Diverticulosis of colon (without mention of hemorrhage)   . Irritable bowel syndrome   . Benign neoplasm of colon   . Renal calculus   . BPH (benign prostatic hypertrophy)   . Degenerative joint disease   . Lumbar back pain   . Anxiety   . Anemia   . Hyperplastic colon polyp 02/12/2011   Current Outpatient Prescriptions on File Prior to Visit  Medication Sig Dispense Refill  . acetaminophen (TYLENOL) 500 MG tablet Take 500 mg by mouth every 6 (six) hours as needed.    [name]  aspirin 81 MG tablet Take 81 mg by mouth daily.      [name] atenolol (TENORMIN) 25 MG tablet Take 1 tablet (25 mg total) by mouth daily. 30 tablet 11  . Cholecalciferol (VITAMIN D) 2000 UNITS CAPS Take 1 capsule by mouth daily.      . COMPRO 25 MG suppository PLACE 1 SUPPOSITORY (25 MG TOTAL) RECTALLY EVERY 12 (TWELVE) HOURS AS NEEDED FOR NAUSEA. 12 suppository 0  . Desoximetasone 0.05 % GEL APPLY TO AFFECTED AREA TWICE A DAY  1  . dextromethorphan-guaiFENesin (MUCINEX DM) 30-600 MG per 12 hr tablet Take 1 tablet by mouth every 12 (twelve) hours.    . fluticasone (FLONASE) 50 MCG/ACT nasal spray Place 2 sprays into both nostrils daily. 16 g 11  . fluticasone-salmeterol (ADVAIR HFA) 115-21 MCG/ACT inhaler Inhale 1 puff into the lungs 2 (two) times daily. 1 Inhaler 11  . glucose blood (ONE TOUCH ULTRA TEST) test strip TEST BLOOD SUGAR ONCE DAILY AS DIRECTED 100 each 4  . hyoscyamine (LEVSIN SL) 0.125 MG SL tablet Place 1 tablet (0.125 mg total) under the tongue every 4 (four) hours as needed. As needed for abdominal cramping 90 tablet 5  . lidocaine (ASPERCREME W/LIDOCAINE) 4 % cream Apply 1 application topically as needed.    . loratadine (CLARITIN) 10 MG tablet Take 10 mg by mouth daily as needed for allergies.    [name] losartan (  COZAAR) 100 MG tablet TAKE 1 TABLET (100 MG TOTAL) BY MOUTH DAILY. 90 tablet 1  . metFORMIN (GLUCOPHAGE) 500 MG tablet TAKE 1 TABLET TWICE A DAY WITH A MEAL 60 tablet 11  . Multiple Vitamins-Minerals (MULTIVITAMIN WITH MINERALS) tablet Take 1 tablet by mouth daily.      . niacin (NIASPAN) 500 MG CR tablet TAKE 1 TABLET BY MOUTH AT BEDTIME 30 tablet 11  . nitroGLYCERIN (NITROSTAT) 0.4 MG SL tablet Place 1 tablet (0.4 mg total) under the tongue every 5 (five) minutes as needed for chest pain. 30 tablet 11  . pantoprazole (PROTONIX) 40 MG tablet TAKE 1 TABLET BY MOUTH EVERY DAY 30 tablet 11  . polyethylene glycol powder (GLYCOLAX/MIRALAX) powder TAKE 17 GRAMS BY MOUTH TWICE A DAY AS  DIRECTED 1054 g 11  . sodium chloride (OCEAN) 0.65 % nasal spray 1 spray by Nasal route as needed.      . tamsulosin (FLOMAX) 0.4 MG CAPS capsule Take 1 capsule (0.4 mg total) by mouth 2 (two) times daily. 60 capsule 5  . traMADol (ULTRAM) 50 MG tablet TAKE 1 TABLET THREE TIMES A DAY AS NEEDED FOR PAIN 90 tablet 5   No current facility-administered medications on file prior to visit.     Review of Systems Constitutional:   No  weight loss, night sweats,  Fevers,  +chills, fatigue, or  lassitude.  HEENT:   No headaches,  Difficulty swallowing,  Tooth/dental problems, or  Sore throat,                No sneezing, itching, ear ache, nasal congestion, post nasal drip,   CV:  No chest pain,  Orthopnea, PND, swelling in lower extremities, anasarca, dizziness, palpitations, syncope.   GI  No heartburn, indigestion, abdominal pain, nausea, vomiting, diarrhea, change in bowel habits, loss of appetite, bloody stools.   Resp:    No chest wall deformity  Skin: no rash or lesions.  GU: no dysuria, change in color of urine, no urgency or frequency.  No flank pain, no hematuria   MS:  No joint pain or swelling.  No decreased range of motion.  No back pain.  Psych:  No change in mood or affect. No depression or anxiety.  No memory loss.         Objective:   Physical Exam Filed Vitals:   02/03/16 1013  BP: 146/72  Pulse: 83  Temp: 98.8 F (37.1 C)  TempSrc: Oral  Height: 6\' 1"  (1.854 m)  Weight: 221 lb (100.245 kg)  SpO2: 96%   GEN: A/Ox3; pleasant , NAD, elderly   HEENT:  [place]/AT,  EACs-clear, TMs-wnl, NOSE-clear drainage THROAT-clear, no lesions, no postnasal drip or exudate noted.   NECK:  Supple w/ fair ROM; no JVD; normal carotid impulses w/o bruits; no thyromegaly or nodules palpated; no lymphadenopathy.  RESP  Few rhonchi , .no accessory muscle use, no dullness to percussion  CARD:  RRR, no m/r/g  , no peripheral edema, pulses intact, no cyanosis or clubbing.  GI:   Soft  & nt; nml bowel sounds; no organomegaly or masses detected.  Musco: Warm bil, no deformities or joint swelling noted.   Neuro: alert, no focal deficits noted.    Skin: Warm, no lesions or rashes  Timber Marshman NP-C  [facility]  02/03/2016        Assessment & Plan:

## 2016-02-03 NOTE — Addendum Note (Signed)
Addended by: Osa Craver on: 02/03/2016 10:56 AM   Modules accepted: Orders

## 2016-02-05 DIAGNOSIS — L820 Inflamed seborrheic keratosis: Secondary | ICD-10-CM | POA: Diagnosis not present

## 2016-02-05 DIAGNOSIS — D225 Melanocytic nevi of trunk: Secondary | ICD-10-CM | POA: Diagnosis not present

## 2016-02-05 DIAGNOSIS — L570 Actinic keratosis: Secondary | ICD-10-CM | POA: Diagnosis not present

## 2016-02-05 DIAGNOSIS — L438 Other lichen planus: Secondary | ICD-10-CM | POA: Diagnosis not present

## 2016-02-05 DIAGNOSIS — L814 Other melanin hyperpigmentation: Secondary | ICD-10-CM | POA: Diagnosis not present

## 2016-02-11 ENCOUNTER — Telehealth: Payer: Self-pay | Admitting: Pulmonary Disease

## 2016-02-11 MED ORDER — LEVOFLOXACIN 500 MG PO TABS: 500.0000 mg | ORAL_TABLET | Freq: Every day | ORAL | Status: DC

## 2016-02-11 NOTE — Telephone Encounter (Signed)
Pt seen by TP 02/03/16 and was given Augmentin and Depo shot.  Pt feels the the meds given did not clear up symptoms 100%. Pt c/o having white to yellow mucus, cough, wheezing and little SOB.  Pt requesting further rec's.   Patient Instructions 02/03/16     Augmentin 875mg  Twice daily For 7 days  Depo medrol shot 80mg  IM  Mucinex DM Twice daily As needed Cough/congestion .  Fluids and rest .  Tylenol As needed  Please contact office for sooner follow up if symptoms do not improve or worsen or seek emergency care  Follow up Dr. Lenna Gilford As planned and As needed    Please advise Dr Lenna Gilford. Thanks.

## 2016-02-11 NOTE — Telephone Encounter (Signed)
Per SN: call in Levaquin 500mg  #7 and remember to take Align OTC once daily.  Thanks.

## 2016-02-11 NOTE — Telephone Encounter (Signed)
Called, spoke with pt. Discussed below recs per SN.  Pt verbalized understanding and aware rx sent to [facility]. Pt to call back and seek emergency care if needed if symptoms do not improve or worsen.   Pt in agreement with plan and voiced no further questions or concerns at this time.

## 2016-03-19 ENCOUNTER — Other Ambulatory Visit: Payer: Self-pay | Admitting: Pulmonary Disease

## 2016-04-01 DIAGNOSIS — B370 Candidal stomatitis: Secondary | ICD-10-CM | POA: Diagnosis not present

## 2016-04-01 DIAGNOSIS — L439 Lichen planus, unspecified: Secondary | ICD-10-CM | POA: Diagnosis not present

## 2016-04-01 DIAGNOSIS — L438 Other lichen planus: Secondary | ICD-10-CM | POA: Diagnosis not present

## 2016-04-01 DIAGNOSIS — L821 Other seborrheic keratosis: Secondary | ICD-10-CM | POA: Diagnosis not present

## 2016-04-08 ENCOUNTER — Other Ambulatory Visit: Payer: Self-pay | Admitting: Pulmonary Disease

## 2016-04-13 ENCOUNTER — Other Ambulatory Visit (INDEPENDENT_AMBULATORY_CARE_PROVIDER_SITE_OTHER): Payer: Medicare Other

## 2016-04-13 ENCOUNTER — Ambulatory Visit (INDEPENDENT_AMBULATORY_CARE_PROVIDER_SITE_OTHER): Payer: Medicare Other | Admitting: Pulmonary Disease

## 2016-04-13 ENCOUNTER — Encounter: Payer: Self-pay | Admitting: Pulmonary Disease

## 2016-04-13 VITALS — BP 142/68 | HR 61 | Temp 98.3°F | Ht 73.75 in | Wt 217.8 lb

## 2016-04-13 DIAGNOSIS — I10 Essential (primary) hypertension: Secondary | ICD-10-CM | POA: Diagnosis not present

## 2016-04-13 DIAGNOSIS — E119 Type 2 diabetes mellitus without complications: Secondary | ICD-10-CM

## 2016-04-13 DIAGNOSIS — J449 Chronic obstructive pulmonary disease, unspecified: Secondary | ICD-10-CM | POA: Diagnosis not present

## 2016-04-13 DIAGNOSIS — J4489 Other specified chronic obstructive pulmonary disease: Secondary | ICD-10-CM

## 2016-04-13 DIAGNOSIS — I2510 Atherosclerotic heart disease of native coronary artery without angina pectoris: Secondary | ICD-10-CM

## 2016-04-13 DIAGNOSIS — K148 Other diseases of tongue: Secondary | ICD-10-CM

## 2016-04-13 DIAGNOSIS — L439 Lichen planus, unspecified: Secondary | ICD-10-CM | POA: Insufficient documentation

## 2016-04-13 DIAGNOSIS — M150 Primary generalized (osteo)arthritis: Secondary | ICD-10-CM

## 2016-04-13 DIAGNOSIS — N138 Other obstructive and reflux uropathy: Secondary | ICD-10-CM

## 2016-04-13 DIAGNOSIS — K3184 Gastroparesis: Secondary | ICD-10-CM

## 2016-04-13 DIAGNOSIS — M159 Polyosteoarthritis, unspecified: Secondary | ICD-10-CM

## 2016-04-13 DIAGNOSIS — E782 Mixed hyperlipidemia: Secondary | ICD-10-CM | POA: Diagnosis not present

## 2016-04-13 DIAGNOSIS — N401 Enlarged prostate with lower urinary tract symptoms: Secondary | ICD-10-CM

## 2016-04-13 LAB — HEMOGLOBIN A1C: Hgb A1c MFr Bld: 6.7 % — ABNORMAL HIGH (ref 4.6–6.5)

## 2016-04-13 MED ORDER — NIACIN ER (ANTIHYPERLIPIDEMIC) 500 MG PO TBCR: 500.0000 mg | EXTENDED_RELEASE_TABLET | Freq: Every day | ORAL | 3 refills | Status: DC

## 2016-04-13 MED ORDER — POLYETHYLENE GLYCOL 3350 17 GM/SCOOP PO POWD: ORAL | 5 refills | Status: DC

## 2016-04-13 MED ORDER — FLUTICASONE PROPIONATE 50 MCG/ACT NA SUSP: 2.0000 | Freq: Every day | NASAL | 11 refills | Status: DC

## 2016-04-13 MED ORDER — TRAMADOL HCL 50 MG PO TABS: ORAL_TABLET | ORAL | 5 refills | Status: DC

## 2016-04-13 MED ORDER — ATENOLOL 25 MG PO TABS: 25.0000 mg | ORAL_TABLET | Freq: Every day | ORAL | 3 refills | Status: DC

## 2016-04-13 MED ORDER — PANTOPRAZOLE SODIUM 40 MG PO TBEC: 40.0000 mg | DELAYED_RELEASE_TABLET | Freq: Every day | ORAL | 3 refills | Status: DC

## 2016-04-13 MED ORDER — METFORMIN HCL 500 MG PO TABS: ORAL_TABLET | ORAL | 3 refills | Status: DC

## 2016-04-13 MED ORDER — TAMSULOSIN HCL 0.4 MG PO CAPS: 0.4000 mg | ORAL_CAPSULE | Freq: Two times a day (BID) | ORAL | 3 refills | Status: DC

## 2016-04-13 MED ORDER — FLUTICASONE-SALMETEROL 115-21 MCG/ACT IN AERO: 1.0000 | INHALATION_SPRAY | Freq: Two times a day (BID) | RESPIRATORY_TRACT | 11 refills | Status: DC

## 2016-04-13 NOTE — Patient Instructions (Signed)
Today we updated your med list in our EPIC system...    Continue your current medications the same...  Today we checked your Metabolic labs and DM bllod test...    We will contact you w/ the results when available...   Remember to use the SPACER for your ADVAIR-HFA inhaler...  Call for any questions...  Let's plan a follow up visit in 55mo, sooner if needed for problems.Marland KitchenMarland Kitchen

## 2016-04-29 ENCOUNTER — Telehealth: Payer: Self-pay | Admitting: Pulmonary Disease

## 2016-04-29 MED ORDER — PREDNISONE 5 MG (21) PO TBPK: ORAL_TABLET | ORAL | 0 refills | Status: DC

## 2016-04-29 NOTE — Telephone Encounter (Signed)
Per SN: sorry for the delay.  Recommend Mucinex 600mg  OTC 2po BID, increase fluids, nasal saline mist every 1-2 hours.  Can also call in pred dosepak 5mg  6-day pack please.  Thanks.

## 2016-04-29 NOTE — Telephone Encounter (Signed)
Spoke with pt. He is aware of SN's recommendations. Rx has been sent in. Nothing further was needed.

## 2016-04-29 NOTE — Telephone Encounter (Signed)
Patient states he has a lot of sinus pressure, sinus drainage, going down his throat causing him throat pain and nausea. Having body aches.  No fever. Worried it will get into his chest.   Pharmacy: [facility]   Allergies  Allergen Reactions  . Prednisone     REACTION: high dose intolerance   Current Outpatient Prescriptions on File Prior to Visit  Medication Sig Dispense Refill  . acetaminophen (TYLENOL) 500 MG tablet Take 500 mg by mouth every 6 (six) hours as needed.    [name] aspirin 81 MG tablet Take 81 mg by mouth daily.      [name] atenolol (TENORMIN) 25 MG tablet Take 1 tablet (25 mg total) by mouth daily. 90 tablet 3  . Cholecalciferol (VITAMIN D) 2000 UNITS CAPS Take 1 capsule by mouth daily.      . clotrimazole (MYCELEX) 10 MG troche Dissolve 1 in the mouth daily    . COMPRO 25 MG suppository PLACE 1 SUPPOSITORY (25 MG TOTAL) RECTALLY EVERY 12 (TWELVE) HOURS AS NEEDED FOR NAUSEA. 12 suppository 0  . Desoximetasone 0.05 % GEL APPLY TO AFFECTED AREA TWICE A DAY  1  . dextromethorphan-guaiFENesin (MUCINEX DM) 30-600 MG per 12 hr tablet Take 1 tablet by mouth every 12 (twelve) hours.    . fluconazole (DIFLUCAN) 100 MG tablet Take 1 tablet daily x 3 days, then 1 weekly x 3 weeks    . fluticasone (FLONASE) 50 MCG/ACT nasal spray Place 2 sprays into both nostrils daily. 16 g 11  . fluticasone-salmeterol (ADVAIR HFA) 115-21 MCG/ACT inhaler Inhale 1 puff into the lungs 2 (two) times daily. 1 Inhaler 11  . glucose blood (ONE TOUCH ULTRA TEST) test strip TEST BLOOD SUGAR ONCE DAILY AS DIRECTED 100 each 4  . hyoscyamine (LEVSIN SL) 0.125 MG SL tablet Place 1 tablet (0.125 mg total) under the tongue every 4 (four) hours as needed. As needed for abdominal cramping 90 tablet 5  . lidocaine (ASPERCREME W/LIDOCAINE) 4 % cream Apply 1 application topically as needed.    . loratadine (CLARITIN) 10 MG tablet Take 10 mg by mouth daily as needed for allergies.    [name] losartan (COZAAR) 100 MG  tablet TAKE 1 TABLET (100 MG TOTAL) BY MOUTH DAILY. 90 tablet 1  . metFORMIN (GLUCOPHAGE) 500 MG tablet TAKE 1 TABLET TWICE A DAY WITH A MEAL 180 tablet 3  . Multiple Vitamins-Minerals (MULTIVITAMIN WITH MINERALS) tablet Take 1 tablet by mouth daily.      . niacin (NIASPAN) 500 MG CR tablet Take 1 tablet (500 mg total) by mouth at bedtime. 90 tablet 3  . nitroGLYCERIN (NITROSTAT) 0.4 MG SL tablet Place 1 tablet (0.4 mg total) under the tongue every 5 (five) minutes as needed for chest pain. 30 tablet 11  . pantoprazole (PROTONIX) 40 MG tablet Take 1 tablet (40 mg total) by mouth daily. 90 tablet 3  . polyethylene glycol powder (GLYCOLAX/MIRALAX) powder TAKE 17 GRAMS BY MOUTH TWICE DAILY AS DIRECTED 1054 g 5  . prochlorperazine (COMPAZINE) 25 MG suppository Place 25 mg rectally every 12 (twelve) hours as needed.    . sodium chloride (OCEAN) 0.65 % nasal spray 1 spray by Nasal route as needed.      . tacrolimus (PROGRAF) 1 MG capsule Dissolve 1 capsule in 500 mL of water and swish as directed    . tamsulosin (FLOMAX) 0.4 MG CAPS capsule Take 1 capsule (0.4 mg total) by mouth 2 (two) times daily. 180 capsule 3  .  traMADol (ULTRAM) 50 MG tablet TAKE 1 TABLET THREE TIMES A DAY AS NEEDED FOR PAIN 90 tablet 5   No current facility-administered medications on file prior to visit.

## 2016-04-29 NOTE — Telephone Encounter (Signed)
Spoke with pt. Advised him that we are still waiting to hear back from SN. He verbalized understanding.  SN - please advise. Thanks.

## 2016-04-29 NOTE — Telephone Encounter (Signed)
Patient checking status of his call from this morning. He can be reached at [phone]

## 2016-05-06 DIAGNOSIS — J3089 Other allergic rhinitis: Secondary | ICD-10-CM | POA: Diagnosis not present

## 2016-05-06 DIAGNOSIS — J4540 Moderate persistent asthma, uncomplicated: Secondary | ICD-10-CM | POA: Diagnosis not present

## 2016-05-15 ENCOUNTER — Other Ambulatory Visit: Payer: Self-pay | Admitting: Pulmonary Disease

## 2016-05-25 DIAGNOSIS — L438 Other lichen planus: Secondary | ICD-10-CM | POA: Diagnosis not present

## 2016-05-27 ENCOUNTER — Telehealth: Payer: Self-pay | Admitting: Pulmonary Disease

## 2016-05-27 MED ORDER — AZITHROMYCIN 250 MG PO TABS: ORAL_TABLET | ORAL | 0 refills | Status: DC

## 2016-05-27 NOTE — Telephone Encounter (Signed)
Spoke with pt and he c/o sinus congestion, runny nose, sore throat, mostly dry cough for several days. Pt denies wheeze, ShOB, cp, tightness or f/n/v. Pt has tried Flonase, [employer], Claritin and Mucinex with little to no relief. Pt is requesting abx sent to pharmacy.   Please call pt at [phone] if after 12pm   SN - Please advise. Thanks!   Allergies  Allergen Reactions  . Prednisone     REACTION: high dose intolerance     Current Outpatient Prescriptions on File Prior to Visit  Medication Sig Dispense Refill  . acetaminophen (TYLENOL) 500 MG tablet Take 500 mg by mouth every 6 (six) hours as needed.    [name] aspirin 81 MG tablet Take 81 mg by mouth daily.      [name] atenolol (TENORMIN) 25 MG tablet Take 1 tablet (25 mg total) by mouth daily. 90 tablet 3  . Cholecalciferol (VITAMIN D) 2000 UNITS CAPS Take 1 capsule by mouth daily.      . clotrimazole (MYCELEX) 10 MG troche Dissolve 1 in the mouth daily    . COMPRO 25 MG suppository PLACE 1 SUPPOSITORY (25 MG TOTAL) RECTALLY EVERY 12 (TWELVE) HOURS AS NEEDED FOR NAUSEA. 12 suppository 0  . Desoximetasone 0.05 % GEL APPLY TO AFFECTED AREA TWICE A DAY  1  . dextromethorphan-guaiFENesin (MUCINEX DM) 30-600 MG per 12 hr tablet Take 1 tablet by mouth every 12 (twelve) hours.    . fluconazole (DIFLUCAN) 100 MG tablet Take 1 tablet daily x 3 days, then 1 weekly x 3 weeks    . fluticasone (FLONASE) 50 MCG/ACT nasal spray Place 2 sprays into both nostrils daily. 16 g 11  . fluticasone-salmeterol (ADVAIR HFA) 115-21 MCG/ACT inhaler Inhale 1 puff into the lungs 2 (two) times daily. 1 Inhaler 11  . glucose blood (ONE TOUCH ULTRA TEST) test strip TEST BLOOD SUGAR ONCE DAILY AS DIRECTED 100 each 4  . hyoscyamine (LEVSIN SL) 0.125 MG SL tablet Place 1 tablet (0.125 mg total) under the tongue every 4 (four) hours as needed. As needed for abdominal cramping 90 tablet 5  . lidocaine (ASPERCREME W/LIDOCAINE) 4 % cream Apply 1 application topically as  needed.    . loratadine (CLARITIN) 10 MG tablet Take 10 mg by mouth daily as needed for allergies.    [name] losartan (COZAAR) 100 MG tablet TAKE 1 TABLET (100 MG TOTAL) BY MOUTH DAILY. 90 tablet 0  . metFORMIN (GLUCOPHAGE) 500 MG tablet TAKE 1 TABLET TWICE A DAY WITH A MEAL 180 tablet 3  . Multiple Vitamins-Minerals (MULTIVITAMIN WITH MINERALS) tablet Take 1 tablet by mouth daily.      . niacin (NIASPAN) 500 MG CR tablet Take 1 tablet (500 mg total) by mouth at bedtime. 90 tablet 3  . nitroGLYCERIN (NITROSTAT) 0.4 MG SL tablet Place 1 tablet (0.4 mg total) under the tongue every 5 (five) minutes as needed for chest pain. 30 tablet 11  . pantoprazole (PROTONIX) 40 MG tablet Take 1 tablet (40 mg total) by mouth daily. 90 tablet 3  . polyethylene glycol powder (GLYCOLAX/MIRALAX) powder TAKE 17 GRAMS BY MOUTH TWICE DAILY AS DIRECTED 1054 g 5  . predniSONE (STERAPRED UNI-PAK 21 TAB) 5 MG (21) TBPK tablet Take as directed per packet instructions 21 tablet 0  . prochlorperazine (COMPAZINE) 25 MG suppository Place 25 mg rectally every 12 (twelve) hours as needed.    . sodium chloride (OCEAN) 0.65 % nasal spray 1 spray by Nasal route as needed.      [name]  tacrolimus (PROGRAF) 1 MG capsule Dissolve 1 capsule in 500 mL of water and swish as directed    . tamsulosin (FLOMAX) 0.4 MG CAPS capsule Take 1 capsule (0.4 mg total) by mouth 2 (two) times daily. 180 capsule 3  . traMADol (ULTRAM) 50 MG tablet TAKE 1 TABLET THREE TIMES A DAY AS NEEDED FOR PAIN 90 tablet 5   No current facility-administered medications on file prior to visit.

## 2016-05-27 NOTE — Telephone Encounter (Signed)
Spoke with pt, aware of recs.  rx sent to pharmacy.  Nothing further needed.  

## 2016-05-27 NOTE — Telephone Encounter (Signed)
Per SN---  zpak #1 take as directed With 1 refill.  thanks

## 2016-06-08 ENCOUNTER — Telehealth: Payer: Self-pay | Admitting: Pulmonary Disease

## 2016-06-08 MED ORDER — METHYLPREDNISOLONE 4 MG PO TBPK: ORAL_TABLET | ORAL | 0 refills | Status: DC

## 2016-06-08 MED ORDER — LEVOFLOXACIN 500 MG PO TABS: 500.0000 mg | ORAL_TABLET | Freq: Every day | ORAL | 0 refills | Status: DC

## 2016-06-08 NOTE — Telephone Encounter (Signed)
Spoke with pt. States that he is having issues with his breathing. Reports chest congestion, wheezing, SOB and cough. Cough is producing large amounts of green mucus. Denies chest tightness or fever. Was given an antibiotic 10 days ago, but this did not give any relief. Would like SN's recommendations.  Allergies  Allergen Reactions  . Prednisone     REACTION: high dose intolerance    SN - please advise. Thanks.

## 2016-06-08 NOTE — Telephone Encounter (Signed)
Pt states he is returning a call. Request call back at [phone]

## 2016-06-08 NOTE — Telephone Encounter (Signed)
Per verbal order from SN  Levaquin 500 Once daily #7 Medrol dose pack  If not feeling better after these medications will need to a follow up ov.   Called spoke with pt. Reviewed SN's recs and verified Jesse Santos on [employer]. Pt voiced understanding and had no further questions. Both prescriptions have been sent to the pharmacy. Nothing further needed.

## 2016-06-13 NOTE — Progress Notes (Signed)
Subjective:    Patient ID: Jesse Santos, male    DOB: 1936/02/11, 80 y.o.   MRN: [id number]  HPI 80 y/o WM w/ mult med problems here for a follow up visit>  ~  SEE PREV EOPIC NOTES FOR OLDER DATA >>     LABS 1/14:  FLP- at goals on Niacin rx;  Chems- ok w/ BS=134, A1c=7.5;  CBC- wnl;  TSH=1.13;  VitD=42;  PSA=1.10      LABS 7/14:  Chems- ok x BS=140, A1c=7.3...   1/15> [name] reports that DrDuda plans a left THR (for avasc necreosis) whenever he is ready, but he's not rushing it, on Tramadol prn & doing satis for now...   LABS 1/15:  FLP- at goals on diet+Niaspan;  Chesm- ok w/ BS=129, A1c=7.0;  CBC- wnl;  TSH=1.06...  EKG 4/15 showed NSR, rate70, wnl, NAD...  Treadmill 6/15 (DrBrackbill) showed borderline ST segment changes w/ exercise & a run of atrial tachy reported...   Myoview 6/15 showed good exerc capacity, norm BP response, no CP, no evid ischemia, decr uptake inferiorly on rest images similar to 2013, EF=69%, norm wall motion...  LABS 7/15:  Chems- ok w/ BS=126, A1c=7.1.Marland KitchenMarland Kitchen rec to continue Metform500Bid & get on diet, incr exerc, get wt down!!!   ~  October 10, 2014:  41mo ROV & [name] indicates it took several wks to get over his URI/AB episode in Oct;      On Advair115-2spBid, Mucinex, flonase, claritin; he reports breathing is stable- min cough, no sput, stable DOE, no edema...     He remains on Niasp500/d; FLP 1/16 showed TChol 150, TG 101, HDL 45, LDL 85    He continues Metform500Bid for his DM; Labs 1/16 showed BS=133, A1c=7.2    He has some lower abd pain/ cramping & has used the Levsin w/ relief- c/w IBS, some diarrhea, nausea, groin discomfort; he had colonoscopy 2012 w/ 2 polyps removed & f/u planned 2017...     He sees DrDuda for Ortho & takes Tramadol50 aS NEEDED... We reviewed prob list, meds, xrays and labs> see below for updates >>   CXR 1/16 showed norm heart size, clear lungs, DJD in Tspine, NAD...  LABS 1/16:  FLP- at goals on Niasp;  Chems- ok w/ BS=133  A1c=7.2;  CBC- wnl;  TSH=1.59;  PSA=1.25...   ~  April 10, 2015:  26mo ROV & [name] reports stable w/ CC= arthritis pain in shoulders/ hips, he sees DrDuda, told he needs left THR but he is holding off, Tramadol helps;  We reviewed the following medical problems during today's office visit >>    HOH> he has bilat hearing aides...    AR> on allergy shots, OTC antihist, Saline, Flonase; gets occas bouts of sinusitis but overall improved & no recent infections reported...     OSA> Sleep Study was in 2/04 w/ RDI=45 & desat to 88% w/ mod snoring & leg jerks, but he states he never could use the CPAP effectively & states he sleeps fine now if he stays on his side; denies daytime sleepiness or other issues...    COPD> exsmoker quit 1981; on AdvairHFA115-2spBid, Mucinex prn; he doesn't like Pred Rx; he had Bronchitic exac treated 10/15 w/ Levaquin, Medrol, Mucinex, etc & improved; needs incr exercise program...    HBP> on Aten25, Losar100; BP=120/70, he denies CP, palpit, dizzy, SOB, edema, etc...    CAD> on ASA81; followed by DrHochrein- CT Abd 2010 showed coronary calcif, Treadmill was neg; Myoview 3/13 was neg,  wnl; last seen 4/15- no new symptoms, active w/ yard work, EKG showed wnl, they decided to do Treadmill (borderline ST depression), then Myoview 6/15 showed NEG- no ischemia, decr uptake inferiorly same as 2013, EF=69%, norm wall motion, low risk...    Hyperlipid> on Niacin'500mg'$ ; FLP 1/16 showed TChol 150, TG 101, HDL 45, LDL 85; continue same, get wt down...    DM> on Metform500Bid; wt=222#, BMI=29; Labs 7/16 showed BS=129, A1c=6.7; rec to continue same med, better diet, get wt down!..    GI- GERD, dysphagia, Divertics, IBS, Polyps> on Protonix40, Miralax, Levsin0.125 prn; followed by DrPerry & stable- last colon 5/12 w/ severe divertics & 2 sm adenomas removed...    GU- Kid stone, BPH> on Flomax0.4 & Cialis prn; PSA remains wnl (PSA 1/16= 1.25)...    DJD, LBP> left knee complaints w/ shots from DrDuda,  similar for left shoulder; hx bilat hip AVN noted on prev CT Abd but he has min complaints and uses Tylenol prn (holding off on surg)...    Anxiety> he does not want anxiolytic meds.[name] EXAM shows Afeb, VSS, O2sat=97%;  Heent- neg, mallampati2;  Chest- clear w/o w/r/r;  Heart- RR w/o m/r/g;  Abd- soft, neg, nontender;  Ext- neg w/o c/c/e;  Neuro- intact...  Labs 7/16>  Chems- ok w/ BS=129, A1c=6.7, Cr=0.83... IMP/PLAN>>  [name] is stable, meds refilled, he's already had the 2016 Flu vaccine & up to date;  We discussed diet/ exercise/ wt reduction... ROV in 80mo  ~  October 15, 2015:  620moOV & [name] saw TP 07/2015 for a lesion on tongue- sent to Derm DrLupton ?lichenoid mucositis (rx w/ several steroid shots & topical meds) & may need [facility] [employer]. He has mult somatic complaints> LBP, leg weakness, HAs, cough/ congestion/ clear mucous (yet he states exercise on treadmill for 3028mat 2.6mp29m.. We reviewed the following medical problems during today's office visit >>     HOH> he has bilat hearing aides...    AR> on allergy shots per DrVanWinkle, OTC antihist, Saline, Flonase; gets occas bouts of sinusitis but overall improved & no recent infections reported...     OSA> Sleep Study 2/04 w/ RDI=45 & desat to 88% w/ mod snoring & leg jerks, but he states he never could use the CPAP effectively & states he sleeps fine now if he stays on his side; denies daytime sleepiness or other issues...    COPD> exsmoker quit 1981; on AdvairHFA115-2spBid, Mucinex prn; he doesn't like Pred Rx; prev Bronchitic exac treated w/ Levaquin, Medrol, Mucinex & improved; needs incr exercise program...    HBP> on Aten25, Losar100; BP=126/70, he denies CP, palpit, dizzy, SOB, edema, etc...    CAD> on ASA81; followed by DrHochrein- CT Abd 2010 showed coronary calcif, Treadmill was neg; Myoview 3/13 was neg, wnl; last seen 4/15- no new symptoms, active w/ yard work, EKG showed wnl, they decided to do Treadmill (borderline ST  depression), then Myoview 6/15 showed NEG- no ischemia, decr uptake inferiorly same as 2013, EF=69%, norm wall motion, low risk...    Hyperlipid> on Niacin'500mg'$ ; FLP 1/17 showed TChol 132, TG 52, HDL 46, LDL 75; continue same, get wt down...    DM> on Metform500Bid; wt=220#, BMI=29; Labs 7/16 showed BS=129, A1c=6.7; rec to continue same med, better diet, get wt down; Labs 1/17 showed BS=134, A1c=6.8...  Marland KitchenMarland KitchenGI- GERD, dysphagia, Divertics, IBS, Polyps> on Protonix40, Miralax, Levsin0.125 prn; followed by DrPerry & stable- last colon 5/12 w/ severe divertics & 2 sm adenomas  removed...    GU- Kid stone, BPH> on Flomax0.4 & Cialis prn; PSA remains wnl (PSA 1/16= 1.25, 1/17=1.06)...    DJD, LBP> left knee complaints w/ shots from DrDuda, similar for left shoulder; hx bilat hip AVN noted on prev CT Abd but he has min complaints and uses Tylenol prn (holding off on surg)...    Anxiety> he does not want anxiolytic meds.[name] EXAM shows Afeb, VSS, O2sat=98%;  Heent- neg, mallampati2;  Chest- clear w/o w/r/r;  Heart- RR w/o m/r/g;  Abd- soft, neg, nontender;  Ext- neg w/o c/c/e;  Neuro- intact...  LABS 10/15/15>  FLP- at goals on diet + niacin;  Chems- ok x BS=134, A1c=6.8 on Metform500bid;  CBC- wnl;  TSH=1.08;  PSA=1.06 IMP/PLAN>>  [name] has mult somatic complaints but objectively in good shape; we discussed ENT 2nd opinion re tongue lesion vs [facility] referral per Derm, he will decide; continue present Rx & ROV 34mo..  ~  April 13, 2016:  667moOV and after the last visit [name] saw DrShoemaker, ENT 10/29/15 for the lesion on his tongue- his note is reviewed in Care Everywhere, he concurred w/ topical steroid therapy for this chronic tongue irritation & gave him Triamcinolone cream;  DrLupton [facility] 04[facility]01/16 7 note reviewed, bx proven lichenoid dermatitis, exam showed large erosions on lat border of tongue c/w oral erosive lichen planus; He rec Diflucan, Mycelex (for prob candida) &  Tacrolimus (Prograf) to dissolve in water & swish as directed=> pt indicates that he is improved, and they are planning f/u eval... we reviewed the following medical problems during today's office visit >>     HOH> he has bilat hearing aides & has been checked by DrShoemaker.    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    AR> on allergy shots per DrVanWinkle, OTC antihist, Saline, Flonase; gets occas bouts of sinusitis but overall improved & no recent infections reported...     OSA> Sleep Study 2/04 w/ RDI=45 & desat to 88% w/ mod snoring & leg jerks, but intol to CPAP & never used; states he sleeps fine now if he stays on his side; denies daytime sleepiness or other issues, naps 1/7...    COPD> exsmoker quit 1981; on AdvairHFA115-2spBid w/ spacer, Mucinex prn; he doesn't like Pred Rx; prev Bronchitic exac treated w/ antibiotics (Levaquin/ Augmentin), Medrol, Mucinex & improved; needs incr exercise program...    HBP> on Aten25, Losar100; BP=142/68, he denies CP, palpit, dizzy, SOB, edema, etc...    CAD> on ASA81; followed by DrHochrein- seen 12/19/15, CT Abd 2010 showed coronary calcif, Treadmill was neg (borderline ST depression); Myoview 3/13 was neg, wnl & Myoview 6/15 showed NEG- no ischemia, decr uptake inferiorly same as 2013, EF=69%, norm wall motion, low risk... He denies CP, palpit, dizzy, SOB, edema, etc; he remains active w/ yard work etc...    Hyperlipid> on Niacin50026mFLP 1/17 showed TChol 132, TG 52, HDL 46, LDL 75; continue same, get wt down...    DM> on Metform500Bid; wt=218#, BMI=29; Labs 7/16 showed BS=129, A1c=6.7; rec to continue same med, better diet, get wt down; Labs 1/17 showed BS=134, A1c=6.8 and A1c 04/13/16= 6.7, stable...    GI- GERD, dysphagia, Divertics, IBS, Polyps> on Protonix40, Miralax, Levsin0.125 prn; followed by DrPerry & stable- last colon 5/12 w/ severe divertics & 2 sm adenomas  removed...    GU- Kid stone, BPH> on Flomax0.4 &  Cialis prn; PSA remains wnl (PSA 1/16= 1.25, 1/17=1.06)...    DJD, LBP> left knee complaints w/ shots from DrDuda, similar for left shoulder; hx bilat hip AVN noted on prev CT Abd but he has min complaints and uses Tylenol prn (holding off on surg)...    Anxiety> he does not want anxiolytic meds.[name] EXAM shows Afeb, VSS, O2sat=98%;  Heent- neg, mallampati2;  Chest- clear w/o w/r/r;  Heart- RR w/o m/r/g;  Abd- soft, neg, nontender;  Ext- neg w/o c/c/e;  Neuro- intact...  LABS 04/13/16:  BMet- ordered but not done;  A1c=6.7.Marland KitchenMarland Kitchen IMP/PLAN>>  [name] is stable w/ his mult medical issues as above- oral lichen planus improved on regimen from Derm at [facility]; BP controlled, he remains active w/o angina, FLP & DM well regualted=> continue same meds.         Problem List:  HEARING LOSS (ICD-389.9) - he has bilat hearing aides...  ALLERGIC RHINITIS (ICD-477.9) - on allergy shots from DrESL Gaetano Hawthorne)... plus Claritin, Saline, Mucinex, Flonase. ~  occas bouts of sinusitis requiring antibiotic Rx... ~  8/13: he had allergy f/u DrVanWinkle> skin testing 2010 pos to dust mites and molds; doing well on shots once/mo; stable & no changes made... ~  3/14: he presented w/ an upper resp infection & treated w/ Depo, Dosepak, Zithromax... ~  8/14:  He had allergy f/u DrVanWinkle> Advair, Flonase, Saline, Mucinex; he checked FENO- reported normal... ~  7/15: stable on allergy meds from DrVanWinkle, plus AdvairHFA115; he had sinus/ AB exac 5/15 treated by TP w/ ZPak=> Levaquin & resolved...  OBSTRUCTIVE SLEEP APNEA (ICD-327.23) - sleep study 8/04 showed RDI= 45 & desat to 88%... mod snoring and leg jerks without sleep disruption... CPAP perscribed but not using it now- states he can't sleep w/ it on & furthermore he is resting well as long as he stays on his side... denies daytime hypersomnolence & not interested in re-evaluation.  COPD (ICD-496) - ex-smoker, quit 1981 >>  ~  on  ADVAIR 100Bid, PROAIR Prn, MUCINEX Bid... he had Pneumovax in 2008... doesn't like Pred Rx...  ~  10/11:  doing well except recent cough, min green sputum (no change in dyspnea)- we will Rx w/ Augmentin, incr Mucinex/ Fluids. ~  4/12:  Breathing at baseline, continue maintenance meds... ~  CXR 12/13 showed normal heart size, clear lungs, NAD... ~  1/14:  He's had 2 exac this past yr- saw TP w/ Rx & improved... ~  1/15:  He continues to do well on Advair + allergy Rx from DrVanWinkle w/ Flonase, Saline, Mucinex... ~  7/15: exsmoker quit 1981; on AdvairHFA115-2spBid, Mucinex prn; he doesn't like Pred Rx; he had Bronchitic exac treated by TP 5/15 w/ ZPak, then Levaquin, Mucinex, etc & improved; needs incr exercise program... ~  10/15: seen w/ refractory bronchitic episode reqiring ZPak=>Levaquin, Medrol, Mucinex, Hydromet, etc... ~  1/16: He had a URI/ AB exac treated 10/15 w/ Levaquin/ Medrol/ Mucinex & resolved; now at baseline on Advair115-2spBid etc...  HYPERTENSION (ICD-401.9) - prev on diet alone> then on Diovan320, but we decided to change to LOSARTAN 100mg /d 5/13 to save $$ ~  He claims that Norvasc & HCTZ "stopped my urine flow"... ~  10/11:  BP= 128/80 & even better at home he says... denies HA, fatigue, visual changes, CP, palipit, dizziness, syncope, dyspnea, edema, etc... ~  4/12:  Pt has seen TP, DrHochrein, & checking BP daily at home; tried on Norvasc but claims it decr his urine flow; now on Diovan &  improved w/ BP= 132/70... ~  5/12:  BP remains well controlled on Diovan 320mg /d= 120/62 today. ~  11/12:  BP= 142/82 on Diovan160 now; denies CP, palpit, dizzy, SOB, edema... ~  5/13:  BP= 128/66 & he's requesting change to cheaper med; rec switch Diovan to LOSARTAN 100mg /d... ~  1/14: on Losar100; BP=140/84, he notes incr HR he says & we decided to add low dose ATENOLOL25mg /d... ~  7/14: on Aten25, Losar100> BP=128/72 & he denies CP, palpit, SOB, edema, etc... ~  1/15: BP controlled on  Aten25, Losar100 w/ BP= 130/80 & he denies CP, palpit, SOB, edema. ~  7/15: on Aten25, Losar100; BP=124/70, he denies CP, palpit, dizzy, SOB, edema, etc. ~  10/15: on Aten25, Losar100; BP= 140/60 & remains stable hemodynamically... ~  BP remains under good control on same meds...  R/O CAD (ICD-414.00) - on ASA 81mg /d... Followed by DrHochrein & his notes are reviewed. ~  hx neg cardiac cath 1988 by DrJoe [place]... ~  NuclearStressTest 10/02 was norm- no scar or ischemia, EF=55%. ~  CT Abd 4/10 via ER showed coronary calcif... referred to Cards. ~  Eval by DrHochrein 6/10 showed norm EKG & cardiac exam... treadmill ordered. ~  Treadmill 6/10 showed reasonable exerc tolerance, no ischemic changes, few PVCs, sl incr BP... ~  EKG 3/13 showed NSR, rate76, WNL, NAD... ~  Myoview 3/13 was neg> exercised for 63min, stopped for fatigue & CP; no ST seg changes, occas PACs & PVCs; hypertensive BP response; no ischemia, EF=71%, normal wall motion... ~  He saw DrHochrein 4/14> hx CAD, doing satis on ASA & BP rx w/o angina & rec to follow program of aggressive risk factor reduction, no change in meds.  ~  EKG 4/14 showed NSR, rate60, wnl, NAD.. ~  6/15: .on ASA81; followed by DrHochrein- last seen 4/15- no new symptoms, active w/ yard work, EKG showed wnl, they decided to do Treadmill (borderline ST depression), then Myoview 6/15 showed NEG- no ischemia, decr uptake inferiorly same as 2013, EF=69%, norm wall motion, low risk...  MIXED HYPERLIPIDEMIA (ICD-272.2) - low HDL and NIASPAN 500mg /d started 4/09... ~  [place] 4/09 showed TChol 160, TG 106, HDL 26, LDL 113... rec- diet + Niaspan 500/d. ~  FLP 10/09 showed TChol 155, TG 78, HDL 33, LDL 106... ~  FLP 4/10 showed TChol 133, TG 63, HDL 30, LDL 91... rec> continue same. ~  FLP 4/11 showed TChol 144, TG 61, HDL 38, LDL 94 ~  FLP 10/11 showed TChol 155, TG 81, HDL 40, LDL 99 ~  FLP 4/12 showed TChol 142, TG 69, HDL 38, LDL 90 ~  FLP 11/12 showed TChol 165, TG  82, HDL 43, LDL 106 ~  FLP 5/13 in Niasp500 showed TChol 155, TG 72, HDL 46, LDL 95... rec change to generic. ~  FLP 1/14 on Niacin500 showed TChol 140, TG 56, HDL 39, LDL 90 ~  FLP 1/15 on Niaspan500 showed TChol 148, TG 57, HDL 43, LDL 94  ~  FLP 1/16 on Niaspan500 showed TChol 150, TG 101, HDL 45, LDL 85 ~  FLP 1/17 on Niaspan500 showed TChol 132, TG 52, HDL 46, LDL 75  DM (ICD-250.00) - on diet + METFORMIN 500mg Bid... he reports BS at home all in the 120-130's... ~  labs 4/09 showed BS= 132, HgA1c= 6.6.Marland KitchenMarland Kitchen rec- same med, better diet... ~  labs 10/09 showed BS= 131, HgA1c= 6.3.Marland KitchenMarland Kitchen ~  labs 4/10 showed BS= 117, HgA1c= 6.2.[name]. rec> same meds/ diet Rx. ~  labs 10/10 showed BS= 138, A1c= 6.5 ~  labs 4/11 showed BS= 121, A1c= 6.5.Marland KitchenMarland Kitchen On Metform500Bid + diet. ~  Dilated eye exam 5/11 by DrShapiro- no retinopathy... ~  labs 10/11 (wt=228#) showed BS= 118, A1c= 7.1.[name]. not as good- get on diet or more meds. ~  Labs 4/12 (wt=229#) showed BS= 119, A1c= 7.3... Wrong direction, may need more meds, get wt down! ~  5/12:  Ophthalmology check by DrShapiro was neg- no retinopathy... ~  Labs 11/12 showed BS= 136, A1c= 7.0 ~  Labs 5/13 on MetformBid showed BS= 132, A1c= 7.1.Marland KitchenMarland Kitchen Continue same, get wt down. ~  Ophthalmology check from DrShapiro 5/13> no DM retinopathy... ~  Labs 1/14 showed BS= 134, A1c= 7.5; not as good- needs better diet, get wt down, same meds for now... ~  Labs 7/14 on Metform500Bid showed BS= 140, A1c= 7.3  ~  Labs 1/154 on Metform500Bid showed BS= 129 & A1c= 7.0, and we reviewed meds/ diet/ exercise/ weight... ~  Labs 7/15 on Metform500Bid showed BS= 126, A1c= 7.1 & reminded of need for diet/ exerc/ wt reduction... ~  Labs 1/16 on Metform500Bid showed BS= 133, A1c= 7.2 ~  Labs 1/17 on Metform500Bid showed BS= 134, A1c= 6.8  GERD (ICD-530.81) & ? GASTROPARESIS - on PROTONIX 40mg /d, & off Reglan per DrPerry... ~   last EGD 6/07 by DrPerry showed gastic polyp... ~  8/10:  given Compazine  suppos for gastroparesis flair by DrGessner. ~  4/12:  C/o intermittent dysphagia & referred back to GI for further eval (colonoscopy due as well)...  DIVERTICULOSIS OF COLON (ICD-562.10) - he uses MIRALAX Bid regularly... IRRITABLE BOWEL SYNDROME (ICD-564.1) COLONIC POLYPS (ICD-211.3) ~  last colonoscopy 5/07 by DrSam showed divertics only... f/u 36yrs. ~  CT Abd 4/10 in ER showed atx bases, coronary calcif, s/p GB, abdAo calcif, 81mm renal stone, divertics, bilat hip AVN. ~  Follow up colonoscopy by DrPerry 5/12 showed severe diverticulosis, 2 sm polyps= tubular adenoma & f/u planned 5 yrs...  RENAL CALCULUS, HX OF (ICD-V13.01) BENIGN PROSTATIC HYPERTROPHY, HX OF (ICD-V13.8) - on FLOMAX 0.4mg /d w/ improved symptoms (he states flow better since stopping Vit E supplement). ~  4/12:  Routine PSA= 4.9 (it was 1.02 4/11) & we rx w/ Doxy Bid x14d, thewn plan recheck PSA==> 1.30  DEGENERATIVE JOINT DISEASE (ICD-715.90) - pt states "I have a bum knee" and eval by DrDuda w/ viscosupplementation shots... Note: Abd CT w/ bilat hip AVN noted... takes TYLENOL Prn... ~  11/10:  s/p left knee arthroscopy by DrDuda... ~  10/11:  s/p fall w/ left knee injury- s/p cortisone shot by DrDuda; hx shot in left shoulder too. ~  7/14: he is c/o incr pain in left hip area; known hx AVN & needs f/u by Ortho...  BACK PAIN, LUMBAR (ICD-724.2)  ANXIETY (ICD-300.00) - not currently on meds for nerves.  ANEMIA (ICD-285.9) - prev hx of GIB... ~  labs 4/09 showed Hg= 14.1 ~  labs 4/10 showed Hg= 13.6 ~  labs 4/11 showed Hg= 13.4 ~  Labs 4/12 showed Hg= 13.8 ~  Labs 3/13 showed Hg= 13.1 ~  Labs 1/14 showed Hg= 13.6 ~  Labs 1/15 showed Hg= 13.7 ~  Labs 1/16 showed Hg= 14.1 ~  CBC remains WNL...  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  Health Maintenance: ~  GI: followed by DrPerry> Colonoscopy 5/07 by DrSam... f/u planned 5 yrs. ~  GU:  See PSAs recorded above... ~  Immunizations: Pneumovax in  2008 @ age 49;  [place] given 7/15; Tetanus- given 4/10;  he gets yearly seasonal Flu vaccine.   Past Surgical History:  Procedure Laterality Date  . CATARACT EXTRACTION    . CHOLECYSTECTOMY    . KNEE SURGERY  11/10  . SKIN CANCER EXCISION  3/10   left hand     Outpatient Encounter Prescriptions as of 04/13/2016  Medication Sig Dispense Refill  . acetaminophen (TYLENOL) 500 MG tablet Take 500 mg by mouth every 6 (six) hours as needed.    [name] aspirin 81 MG tablet Take 81 mg by mouth daily.      [name] atenolol (TENORMIN) 25 MG tablet Take 1 tablet (25 mg total) by mouth daily. 90 tablet 3  . Cholecalciferol (VITAMIN D) 2000 UNITS CAPS Take 1 capsule by mouth daily.      . clotrimazole (MYCELEX) 10 MG troche Dissolve 1 in the mouth daily    . COMPRO 25 MG suppository PLACE 1 SUPPOSITORY (25 MG TOTAL) RECTALLY EVERY 12 (TWELVE) HOURS AS NEEDED FOR NAUSEA. 12 suppository 0  . Desoximetasone 0.05 % GEL APPLY TO AFFECTED AREA TWICE A DAY  1  . dextromethorphan-guaiFENesin (MUCINEX DM) 30-600 MG per 12 hr tablet Take 1 tablet by mouth every 12 (twelve) hours.    . fluconazole (DIFLUCAN) 100 MG tablet Take 1 tablet daily x 3 days, then 1 weekly x 3 weeks    . fluticasone (FLONASE) 50 MCG/ACT nasal spray Place 2 sprays into both nostrils daily. 16 g 11  . fluticasone-salmeterol (ADVAIR HFA) 115-21 MCG/ACT inhaler Inhale 1 puff into the lungs 2 (two) times daily. 1 Inhaler 11  . glucose blood (ONE TOUCH ULTRA TEST) test strip TEST BLOOD SUGAR ONCE DAILY AS DIRECTED 100 each 4  . hyoscyamine (LEVSIN SL) 0.125 MG SL tablet Place 1 tablet (0.125 mg total) under the tongue every 4 (four) hours as needed. As needed for abdominal cramping 90 tablet 5  . lidocaine (ASPERCREME W/LIDOCAINE) 4 % cream Apply 1 application topically as needed.    . loratadine (CLARITIN) 10 MG tablet Take 10 mg by mouth daily as needed for allergies.    . metFORMIN (GLUCOPHAGE) 500 MG tablet TAKE 1 TABLET TWICE A DAY WITH A MEAL 180  tablet 3  . Multiple Vitamins-Minerals (MULTIVITAMIN WITH MINERALS) tablet Take 1 tablet by mouth daily.      . niacin (NIASPAN) 500 MG CR tablet Take 1 tablet (500 mg total) by mouth at bedtime. 90 tablet 3  . nitroGLYCERIN (NITROSTAT) 0.4 MG SL tablet Place 1 tablet (0.4 mg total) under the tongue every 5 (five) minutes as needed for chest pain. 30 tablet 11  . pantoprazole (PROTONIX) 40 MG tablet Take 1 tablet (40 mg total) by mouth daily. 90 tablet 3  . polyethylene glycol powder (GLYCOLAX/MIRALAX) powder TAKE 17 GRAMS BY MOUTH TWICE DAILY AS DIRECTED 1054 g 5  . sodium chloride (OCEAN) 0.65 % nasal spray 1 spray by Nasal route as needed.      . tacrolimus (PROGRAF) 1 MG capsule Dissolve 1 capsule in 500 mL of water and swish as directed    . tamsulosin (FLOMAX) 0.4 MG CAPS capsule Take 1 capsule (0.4 mg total) by mouth 2 (two) times daily. 180 capsule 3  . traMADol (ULTRAM) 50 MG tablet TAKE 1 TABLET THREE TIMES A DAY AS NEEDED FOR PAIN 90 tablet 5  . [DISCONTINUED] atenolol (TENORMIN) 25 MG tablet Take 1 tablet (25 mg total) by mouth daily. 30 tablet 11  . [  DISCONTINUED] fluticasone (FLONASE) 50 MCG/ACT nasal spray Place 2 sprays into both nostrils daily. 16 g 11  . [DISCONTINUED] fluticasone-salmeterol (ADVAIR HFA) 115-21 MCG/ACT inhaler Inhale 1 puff into the lungs 2 (two) times daily. 1 Inhaler 11  . [DISCONTINUED] losartan (COZAAR) 100 MG tablet TAKE 1 TABLET (100 MG TOTAL) BY MOUTH DAILY. 90 tablet 1  . [DISCONTINUED] metFORMIN (GLUCOPHAGE) 500 MG tablet TAKE 1 TABLET TWICE A DAY WITH A MEAL 60 tablet 11  . [DISCONTINUED] niacin (NIASPAN) 500 MG CR tablet TAKE 1 TABLET BY MOUTH AT BEDTIME 30 tablet 11  . [DISCONTINUED] pantoprazole (PROTONIX) 40 MG tablet TAKE 1 TABLET BY MOUTH EVERY DAY 30 tablet 11  . [DISCONTINUED] polyethylene glycol powder (GLYCOLAX/MIRALAX) powder TAKE 17 GRAMS BY MOUTH TWICE DAILY AS DIRECTED 1054 g 2  . [DISCONTINUED] tamsulosin (FLOMAX) 0.4 MG CAPS capsule TAKE  1 CAPSULE BY MOUTH TWICE A DAY 60 capsule 2  . [DISCONTINUED] traMADol (ULTRAM) 50 MG tablet TAKE 1 TABLET THREE TIMES A DAY AS NEEDED FOR PAIN 90 tablet 5  . prochlorperazine (COMPAZINE) 25 MG suppository Place 25 mg rectally every 12 (twelve) hours as needed.    . [DISCONTINUED] levofloxacin (LEVAQUIN) 500 MG tablet Take 1 tablet (500 mg total) by mouth daily. (Patient not taking: Reported on 04/13/2016) 7 tablet 0   No facility-administered encounter medications on file as of 04/13/2016.     Allergies  Allergen Reactions  . Prednisone     REACTION: high dose intolerance    Immunization History  Administered Date(s) Administered  . H1N1 08/21/2008  . Influenza Split 06/01/2011, 05/26/2012  . Influenza Whole 06/06/2008, 06/21/2009, 06/06/2010  . Influenza,inj,Quad PF,36+ Mos 06/07/2013, 07/09/2014, 06/10/2015  . Pneumococcal Conjugate-13 04/05/2014  . Pneumococcal Polysaccharide-23 06/30/2006  . Zoster 09/14/2010    Current Medications, Allergies, Past Medical History, Past Surgical History, Family History, and Social History were reviewed in [employer] record.    Review of Systems         See HPI - all other systems neg except as noted... The patient complains of decreased hearing, dyspnea on exertion, headaches, and difficulty walking.  The patient denies anorexia, fever, weight loss, weight gain, vision loss, hoarseness, chest pain, syncope, peripheral edema, prolonged cough, hemoptysis, abdominal pain, melena, hematochezia, severe indigestion/heartburn, hematuria, incontinence, muscle weakness, suspicious skin lesions, transient blindness, depression, unusual weight change, abnormal bleeding, enlarged lymph nodes, and angioedema.     Objective:   Physical Exam      WD, WN, 80 y/o WM in NAD... GENERAL:  Alert & oriented; pleasant & cooperative... HEENT:  [place]/AT, EOM-full, PERRLA, EACs-clear, TMs-wnl, NOSE- pale, congested, THROAT-clear, but lichenoid  lesion on lat tongue. NECK:  Supple w/ fairROM; no JVD; normal carotid impulses w/o bruits; no thyromegaly or nodules palpated; no lymphadenopathy. CHEST:  Clear to P & A w/o wheezing, rhonchi, rales, or signs of consolidation... HEART:  Regular Rhythm; without murmurs/ rubs/ or gallops detected...  ABDOMEN:  Soft & nontender; normal bowel sounds; no organomegaly or masses palpated., no guarding, or rebound EXT: without deformities, mild arthritic changes; no varicose veins/ venous insuffic/ or edema. NEURO:  CN's intact;  no focal neuro deficits... DERM:  No lesions noted; no rash etc...  RADIOLOGY DATA:  Reviewed in the EPIC EMR & discussed w/ the patient...  LABORATORY DATA:  Reviewed in the EPIC EMR & discussed w/ the patient...   Assessment & Plan:    Lesion on tongue>. "lichenoid mucositis" and he is improved on Rx from [facility]  DrJorizzo (Diflucan, Mycelex, Prograf=> dissolve in water/ swish/ 7 spit...   Hx Acute Bronchitis> presented 10/15 w/ refractory bronchitic episode; given ZPak=>Levaquin, Depo80/ Medrol dosepak, Mucinex, Hydromet, etc=> resolved... COPD exac>  He continues on Advair, Proair rescue, Mucinex; also Claritin, Flonase, Saline; stable continue same Rx...  HBP>  On LOSARTAN 100mg /d & [id number] w/ good control of BP, continue same...  R/O CAD>  Followed by DrHochrein, and doing satis w/ neg Myoview 6/15... continue ASA & risk factor reduction....  Lipids>  Looks satis on his diet + Niacin...  DM>  Control is fair w/ A1c stable at 7.1; he was told there are more meds in his future if he doesn't get his wt down (currently~230#)...  GI>  Per DrPerry w/ GERD, ?Gastroparesis, Divertics, IBS, Colon polyps> f/u colon 5/12 w/ divertics & 2 sm adenomas, repeat planned ~64yrs...  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  Anxiety>  Certainly an issue, he does not want anxiolytic Rx...   Patient's Medications  New  Prescriptions   AZITHROMYCIN (ZITHROMAX) 250 MG TABLET    Take 2 today, then 1 daily until gone.   LEVOFLOXACIN (LEVAQUIN) 500 MG TABLET    Take 1 tablet (500 mg total) by mouth daily.   METHYLPREDNISOLONE (MEDROL DOSEPAK) 4 MG TBPK TABLET    Follow package instructions for 6 day pack   PREDNISONE (STERAPRED UNI-PAK 21 TAB) 5 MG (21) TBPK TABLET    Take as directed per packet instructions  Previous Medications   ACETAMINOPHEN (TYLENOL) 500 MG TABLET    Take 500 mg by mouth every 6 (six) hours as needed.   ASPIRIN 81 MG TABLET    Take 81 mg by mouth daily.     CHOLECALCIFEROL (VITAMIN D) 2000 UNITS CAPS    Take 1 capsule by mouth daily.     CLOTRIMAZOLE (MYCELEX) 10 MG TROCHE    Dissolve 1 in the mouth daily   COMPRO 25 MG SUPPOSITORY    PLACE 1 SUPPOSITORY (25 MG TOTAL) RECTALLY EVERY 12 (TWELVE) HOURS AS NEEDED FOR NAUSEA.   DESOXIMETASONE 0.05 % GEL    APPLY TO AFFECTED AREA TWICE A DAY   DEXTROMETHORPHAN-GUAIFENESIN (MUCINEX DM) 30-600 MG PER 12 HR TABLET    Take 1 tablet by mouth every 12 (twelve) hours.   FLUCONAZOLE (DIFLUCAN) 100 MG TABLET    Take 1 tablet daily x 3 days, then 1 weekly x 3 weeks   GLUCOSE BLOOD (ONE TOUCH ULTRA TEST) TEST STRIP    TEST BLOOD SUGAR ONCE DAILY AS DIRECTED   HYOSCYAMINE (LEVSIN SL) 0.125 MG SL TABLET    Place 1 tablet (0.125 mg total) under the tongue every 4 (four) hours as needed. As needed for abdominal cramping   LIDOCAINE (ASPERCREME W/LIDOCAINE) 4 % CREAM    Apply 1 application topically as needed.   LORATADINE (CLARITIN) 10 MG TABLET    Take 10 mg by mouth daily as needed for allergies.   MULTIPLE VITAMINS-MINERALS (MULTIVITAMIN WITH MINERALS) TABLET    Take 1 tablet by mouth daily.     NITROGLYCERIN (NITROSTAT) 0.4 MG SL TABLET    Place 1 tablet (0.4 mg total) under the tongue every 5 (five) minutes as needed for chest pain.   PROCHLORPERAZINE (COMPAZINE) 25 MG SUPPOSITORY    Place 25 mg rectally every 12 (twelve) hours as needed.   SODIUM CHLORIDE  (OCEAN) 0.65 % NASAL SPRAY    1 spray by Nasal route as  needed.     TACROLIMUS (PROGRAF) 1 MG CAPSULE    Dissolve 1 capsule in 500 mL of water and swish as directed  Modified Medications   Modified Medication Previous Medication   ATENOLOL (TENORMIN) 25 MG TABLET atenolol (TENORMIN) 25 MG tablet      Take 1 tablet (25 mg total) by mouth daily.    Take 1 tablet (25 mg total) by mouth daily.   FLUTICASONE (FLONASE) 50 MCG/ACT NASAL SPRAY fluticasone (FLONASE) 50 MCG/ACT nasal spray      Place 2 sprays into both nostrils daily.    Place 2 sprays into both nostrils daily.   FLUTICASONE-SALMETEROL (ADVAIR HFA) 115-21 MCG/ACT INHALER fluticasone-salmeterol (ADVAIR HFA) 115-21 MCG/ACT inhaler      Inhale 1 puff into the lungs 2 (two) times daily.    Inhale 1 puff into the lungs 2 (two) times daily.   LOSARTAN (COZAAR) 100 MG TABLET losartan (COZAAR) 100 MG tablet      TAKE 1 TABLET (100 MG TOTAL) BY MOUTH DAILY.    TAKE 1 TABLET (100 MG TOTAL) BY MOUTH DAILY.   METFORMIN (GLUCOPHAGE) 500 MG TABLET metFORMIN (GLUCOPHAGE) 500 MG tablet      TAKE 1 TABLET TWICE A DAY WITH A MEAL    TAKE 1 TABLET TWICE A DAY WITH A MEAL   NIACIN (NIASPAN) 500 MG CR TABLET niacin (NIASPAN) 500 MG CR tablet      Take 1 tablet (500 mg total) by mouth at bedtime.    TAKE 1 TABLET BY MOUTH AT BEDTIME   PANTOPRAZOLE (PROTONIX) 40 MG TABLET pantoprazole (PROTONIX) 40 MG tablet      Take 1 tablet (40 mg total) by mouth daily.    TAKE 1 TABLET BY MOUTH EVERY DAY   POLYETHYLENE GLYCOL POWDER (GLYCOLAX/MIRALAX) POWDER polyethylene glycol powder (GLYCOLAX/MIRALAX) powder      TAKE 17 GRAMS BY MOUTH TWICE DAILY AS DIRECTED    TAKE 17 GRAMS BY MOUTH TWICE DAILY AS DIRECTED   TAMSULOSIN (FLOMAX) 0.4 MG CAPS CAPSULE tamsulosin (FLOMAX) 0.4 MG CAPS capsule      Take 1 capsule (0.4 mg total) by mouth 2 (two) times daily.    TAKE 1 CAPSULE BY MOUTH TWICE A DAY   TRAMADOL (ULTRAM) 50 MG TABLET traMADol (ULTRAM) 50 MG tablet      TAKE 1  TABLET THREE TIMES A DAY AS NEEDED FOR PAIN    TAKE 1 TABLET THREE TIMES A DAY AS NEEDED FOR PAIN  Discontinued Medications   LEVOFLOXACIN (LEVAQUIN) 500 MG TABLET    Take 1 tablet (500 mg total) by mouth daily.

## 2016-07-03 ENCOUNTER — Ambulatory Visit (INDEPENDENT_AMBULATORY_CARE_PROVIDER_SITE_OTHER): Payer: Medicare Other

## 2016-07-03 DIAGNOSIS — Z23 Encounter for immunization: Secondary | ICD-10-CM

## 2016-07-13 DIAGNOSIS — H524 Presbyopia: Secondary | ICD-10-CM | POA: Diagnosis not present

## 2016-07-13 DIAGNOSIS — E119 Type 2 diabetes mellitus without complications: Secondary | ICD-10-CM | POA: Diagnosis not present

## 2016-07-13 DIAGNOSIS — Z961 Presence of intraocular lens: Secondary | ICD-10-CM | POA: Diagnosis not present

## 2016-07-21 DIAGNOSIS — L439 Lichen planus, unspecified: Secondary | ICD-10-CM | POA: Diagnosis not present

## 2016-07-21 DIAGNOSIS — L438 Other lichen planus: Secondary | ICD-10-CM | POA: Diagnosis not present

## 2016-07-21 DIAGNOSIS — Z79899 Other long term (current) drug therapy: Secondary | ICD-10-CM | POA: Diagnosis not present

## 2016-07-21 DIAGNOSIS — Z5181 Encounter for therapeutic drug level monitoring: Secondary | ICD-10-CM | POA: Diagnosis not present

## 2016-07-21 DIAGNOSIS — B370 Candidal stomatitis: Secondary | ICD-10-CM | POA: Diagnosis not present

## 2016-08-10 ENCOUNTER — Encounter: Payer: Self-pay | Admitting: Acute Care

## 2016-08-10 ENCOUNTER — Ambulatory Visit (INDEPENDENT_AMBULATORY_CARE_PROVIDER_SITE_OTHER)
Admission: RE | Admit: 2016-08-10 | Discharge: 2016-08-10 | Disposition: A | Discharge status: Home / Self Care | Payer: Medicare Other | Source: Ambulatory Visit | Attending: Acute Care | Admitting: Acute Care

## 2016-08-10 ENCOUNTER — Ambulatory Visit (INDEPENDENT_AMBULATORY_CARE_PROVIDER_SITE_OTHER): Payer: Medicare Other | Admitting: Acute Care

## 2016-08-10 VITALS — BP 132/74 | HR 76 | Ht 73.0 in | Wt 219.4 lb

## 2016-08-10 DIAGNOSIS — R05 Cough: Secondary | ICD-10-CM

## 2016-08-10 DIAGNOSIS — I2510 Atherosclerotic heart disease of native coronary artery without angina pectoris: Secondary | ICD-10-CM

## 2016-08-10 DIAGNOSIS — R059 Cough, unspecified: Secondary | ICD-10-CM

## 2016-08-10 DIAGNOSIS — J441 Chronic obstructive pulmonary disease with (acute) exacerbation: Secondary | ICD-10-CM

## 2016-08-10 MED ORDER — LEVOFLOXACIN 500 MG PO TABS: 500.0000 mg | ORAL_TABLET | Freq: Every day | ORAL | 0 refills | Status: DC

## 2016-08-10 MED ORDER — METHYLPREDNISOLONE ACETATE 80 MG/ML IJ SUSP
120.0000 mg | Freq: Once | INTRAMUSCULAR | Status: AC
  Administered 2016-08-10: 120 mg via INTRAMUSCULAR

## 2016-08-10 MED ORDER — PREDNISONE 10 MG PO TABS: ORAL_TABLET | ORAL | 0 refills | Status: DC

## 2016-08-10 NOTE — Patient Instructions (Addendum)
It is nice to meet you today. We will treat you with Levaquin 500 mg 1 time daily for 7 days. CXR today Depo Medrol Injection today. Tomorrow ( 08/11/2016) start Prednisone taper; 10 mg tablets: 4 tabs x 2 days, 3 tabs x 2 days, 2 tabs x 2 days 1 tab x 2 days then stop. Follow up in 4 weeks with Dr. Lenna Gilford or NP. Please contact office for sooner follow up if symptoms do not improve or worsen or seek emergency care

## 2016-08-10 NOTE — Progress Notes (Signed)
History of Present Illness Jesse Santos is a 80 y.o. male former smoker followed for COPD by Dr. Lenna Gilford.  HPI 80 yo male, former smoker ,  with multiple medical problems including HTN, COPD , DM and dyslipdemia Followed by Dr. Lenna Gilford  For primary care .   08/10/2016 Acute OV Pt. Presents today with new onset of cough, and sinus congestion. He states he has been coughing up significant amounts of green mucus.He states he has not had a fever that he is aware of. He was recently started on methotrexate 7.5 mg once weekly for a tongue lesion by a physician at [facility]. He is currently being supplemented with folic acid 1 mg every day. He is compliant with his Advair HFA 2 puffs twice daily, and Mucinex when necessary. Due to fact he is immunocompromised with  Methotrexate therapy, we will treat this episode of bronchitis aggressively. In the past he had good success with treatment with Levaquin. He denies fever, chest pain, orthopnea, or hemoptysis.  Tests Chest x-ray 08/10/2016 There is no edema or consolidation. The heart size and pulmonary vascularity are normal.   Past medical hx Past Medical History:  Diagnosis Date  . Allergic rhinitis, cause unspecified   . Anemia   . Anxiety   . Benign neoplasm of colon   . BPH (benign prostatic hypertrophy)   . COPD (chronic obstructive pulmonary disease) ([place])   . Coronary artery disease    Coronary calcification with negative ETT 2010;   ETT-Myoview (02/2014):  Normal stress nuclear study. No evidence of ischemia.  LV Ejection Fraction: 69%  . Degenerative joint disease   . Diverticulosis of colon (without mention of hemorrhage)   . Esophageal reflux   . Hearing loss   . Hyperplastic colon polyp 02/12/2011  . Irritable bowel syndrome   . Lumbar back pain   . Mixed hyperlipidemia   . Obstructive sleep apnea (adult) (pediatric)   . Renal calculus   . Type II or unspecified type diabetes mellitus without mention  of complication, not stated as uncontrolled   . Unspecified essential hypertension      Past surgical hx, Family hx, Social hx all reviewed.  Current Outpatient Prescriptions on File Prior to Visit  Medication Sig  . acetaminophen (TYLENOL) 500 MG tablet Take 500 mg by mouth every 6 (six) hours as needed.  [name] aspirin 81 MG tablet Take 81 mg by mouth daily.    [name] atenolol (TENORMIN) 25 MG tablet Take 1 tablet (25 mg total) by mouth daily.  . Cholecalciferol (VITAMIN D) 2000 UNITS CAPS Take 1 capsule by mouth daily.    . clotrimazole (MYCELEX) 10 MG troche Dissolve 1 in the mouth daily  . COMPRO 25 MG suppository PLACE 1 SUPPOSITORY (25 MG TOTAL) RECTALLY EVERY 12 (TWELVE) HOURS AS NEEDED FOR NAUSEA.  [name] dextromethorphan-guaiFENesin (MUCINEX DM) 30-600 MG per 12 hr tablet Take 1 tablet by mouth every 12 (twelve) hours.  . fluticasone (FLONASE) 50 MCG/ACT nasal spray Place 2 sprays into both nostrils daily.  . fluticasone-salmeterol (ADVAIR HFA) 115-21 MCG/ACT inhaler Inhale 1 puff into the lungs 2 (two) times daily.  [name] glucose blood (ONE TOUCH ULTRA TEST) test strip TEST BLOOD SUGAR ONCE DAILY AS DIRECTED  . hyoscyamine (LEVSIN SL) 0.125 MG SL tablet Place 1 tablet (0.125 mg total) under the tongue every 4 (four) hours as needed. As needed for abdominal cramping  . lidocaine (ASPERCREME W/LIDOCAINE) 4 % cream Apply 1 application topically as needed.  [name]  loratadine (CLARITIN) 10 MG tablet Take 10 mg by mouth daily as needed for allergies.  [name] losartan (COZAAR) 100 MG tablet TAKE 1 TABLET (100 MG TOTAL) BY MOUTH DAILY.  . metFORMIN (GLUCOPHAGE) 500 MG tablet TAKE 1 TABLET TWICE A DAY WITH A MEAL  . Multiple Vitamins-Minerals (MULTIVITAMIN WITH MINERALS) tablet Take 1 tablet by mouth daily.    . niacin (NIASPAN) 500 MG CR tablet Take 1 tablet (500 mg total) by mouth at bedtime.  . nitroGLYCERIN (NITROSTAT) 0.4 MG SL tablet Place 1 tablet (0.4 mg total) under the tongue every 5 (five) minutes as needed  for chest pain.  . pantoprazole (PROTONIX) 40 MG tablet Take 1 tablet (40 mg total) by mouth daily.  . polyethylene glycol powder (GLYCOLAX/MIRALAX) powder TAKE 17 GRAMS BY MOUTH TWICE DAILY AS DIRECTED  . prochlorperazine (COMPAZINE) 25 MG suppository Place 25 mg rectally every 12 (twelve) hours as needed.  . sodium chloride (OCEAN) 0.65 % nasal spray 1 spray by Nasal route as needed.    . tacrolimus (PROGRAF) 1 MG capsule Dissolve 1 capsule in 500 mL of water and swish as directed  . tamsulosin (FLOMAX) 0.4 MG CAPS capsule Take 1 capsule (0.4 mg total) by mouth 2 (two) times daily.  . traMADol (ULTRAM) 50 MG tablet TAKE 1 TABLET THREE TIMES A DAY AS NEEDED FOR PAIN  . Desoximetasone 0.05 % GEL APPLY TO AFFECTED AREA TWICE A DAY   No current facility-administered medications on file prior to visit.      Allergies  Allergen Reactions  . Prednisone     REACTION: high dose intolerance    Review Of Systems:  Constitutional:   No  weight loss, night sweats,  Fevers, chills, +fatigue, or + lassitude.  HEENT:   No headaches,  Difficulty swallowing,  Tooth/dental problems, or  Sore throat,                No sneezing, itching, ear ache, nasal congestion, post nasal drip,   CV:  No chest pain,  Orthopnea, PND, swelling in lower extremities, anasarca, dizziness, palpitations, syncope.   GI  No heartburn, indigestion, abdominal pain, nausea, vomiting, diarrhea, change in bowel habits, loss of appetite, bloody stools.   Resp: No shortness of breath with exertion or at rest.  + excess mucus, + productive cough,  No non-productive cough,  No coughing up of blood.  + change in color of mucus.  + wheezing.  No chest wall deformity  Skin: no rash or lesions.  GU: no dysuria, change in color of urine, no urgency or frequency.  No flank pain, no hematuria   MS:  No joint pain or swelling.  No decreased range of motion.  No back pain.  Psych:  No change in mood or affect. No depression or anxiety.   No memory loss.   Vital Signs BP 132/74 (BP Location: Left Arm, Patient Position: Sitting, Cuff Size: Normal)   Pulse 76   Ht 6\' 1"  (1.854 m)   Wt 219 lb 6.4 oz (99.5 kg)   SpO2 98%   BMI 28.95 kg/m    Physical Exam:  General- No distress,  A&Ox3, pleasant ENT: No sinus tenderness, TM clear, pale nasal mucosa, no oral exudate,no post nasal drip, no LAN Cardiac: S1, S2, regular rate and rhythm, no murmur Chest: + wheeze/ no rales/ dullness; rhonchi throughout ,no accessory muscle use, no nasal flaring, no sternal retractions Abd.: Soft Non-tender Ext: No clubbing cyanosis, edema Neuro:  normal strength Skin: No  rashes, warm and dry Psych: normal mood and behavior   Assessment/Plan  COPD exacerbation Bronchitis with moderate COPD exacerbation Plan We will treat you with Levaquin 500 mg 1 time daily for 7 days. CXR today Depo Medrol Injection 120 mg  today. Tomorrow ( 08/11/2016) start Prednisone taper; 10 mg tablets: 4 tabs x 2 days, 3 tabs x 2 days, 2 tabs x 2 days 1 tab x 2 days then stop. Follow up in 4 weeks with Dr. Lenna Gilford or NP. Please contact office for sooner follow up if symptoms do not improve or worsen or seek emergency care       Magdalen Spatz, NP 08/10/2016  7:18 PM

## 2016-08-10 NOTE — Assessment & Plan Note (Signed)
Bronchitis with moderate COPD exacerbation Plan We will treat you with Levaquin 500 mg 1 time daily for 7 days. CXR today Depo Medrol Injection 120 mg  today. Tomorrow ( 08/11/2016) start Prednisone taper; 10 mg tablets: 4 tabs x 2 days, 3 tabs x 2 days, 2 tabs x 2 days 1 tab x 2 days then stop. Follow up in 4 weeks with Dr. Lenna Gilford or NP. Please contact office for sooner follow up if symptoms do not improve or worsen or seek emergency care

## 2016-08-11 ENCOUNTER — Telehealth: Payer: Self-pay

## 2016-08-11 NOTE — Telephone Encounter (Signed)
I have returned Jesse Santos call. I explained to him that it is fine to take probiotic while he is taking his Levaquin. I told him he could choose between over-the-counter products, or he could use activity yogurt with probiotic. He verbalized understanding of the above and had no further questions at completion of the call.

## 2016-08-11 NOTE — Telephone Encounter (Signed)
SG  Please advise,  I spoke to this pt. About his xray results he wanted to know should he also be taking a probiotic

## 2016-08-14 ENCOUNTER — Other Ambulatory Visit: Payer: Self-pay | Admitting: Pulmonary Disease

## 2016-08-20 DIAGNOSIS — Z5181 Encounter for therapeutic drug level monitoring: Secondary | ICD-10-CM | POA: Diagnosis not present

## 2016-09-08 ENCOUNTER — Ambulatory Visit (INDEPENDENT_AMBULATORY_CARE_PROVIDER_SITE_OTHER): Payer: Medicare Other | Admitting: Adult Health

## 2016-09-08 ENCOUNTER — Encounter: Payer: Self-pay | Admitting: Adult Health

## 2016-09-08 DIAGNOSIS — I2510 Atherosclerotic heart disease of native coronary artery without angina pectoris: Secondary | ICD-10-CM

## 2016-09-08 DIAGNOSIS — J441 Chronic obstructive pulmonary disease with (acute) exacerbation: Secondary | ICD-10-CM | POA: Diagnosis not present

## 2016-09-08 DIAGNOSIS — J3089 Other allergic rhinitis: Secondary | ICD-10-CM

## 2016-09-08 NOTE — Assessment & Plan Note (Addendum)
Flare now resolving   Plan  Patient Instructions  Continue on Advair .  Saline nasal rinses  Continue on Claritin and Flonase  Mucinex DM Twice daily  .As needed  Cough/congestion  Follow up with Derm as planned  follow up Dr. Lenna Gilford  In 2 months and As needed   Please contact office for sooner follow up if symptoms do not improve or worsen or seek emergency care

## 2016-09-08 NOTE — Progress Notes (Signed)
@Patient  ID: Jesse Santos, male    DOB: 10-02-1935, 80 y.o.   MRN: [id number]  Chief Complaint  Patient presents with  . Follow-up    COPD     Referring provider: Noralee Space, MD  HPI: 80 yo male, former smoker ,  with multiple medical problems including HTN, COPD , DM and dyslipdemia Followed by Dr. Lenna Gilford  For primary care   09/08/2016 Follow up : COPD flare  Pt returns for 1 month follow up . Was seen last ov for COPD exacerbation . Tx w/ Levaquin and Prednisone. He is feeling better with less cough and dyspnea.  No wheezing . Remains on Advair .  Has nasal drip , taking claritin and flonase.   Undergoing treatment with Dermatology for oral lichen planus with Methotrexate and oral tacrolimus . Dr Sharol Roussel [facility] .     Allergies  Allergen Reactions  . Prednisone     REACTION: high dose intolerance    Immunization History  Administered Date(s) Administered  . H1N1 08/21/2008  . Influenza Split 06/01/2011, 05/26/2012  . Influenza Whole 06/06/2008, 06/21/2009, 06/06/2010  . Influenza, High Dose Seasonal PF 07/03/2016  . Influenza,inj,Quad PF,36+ Mos 06/07/2013, 07/09/2014, 06/10/2015  . Pneumococcal Conjugate-13 04/05/2014  . Pneumococcal Polysaccharide-23 06/30/2006  . Zoster 09/14/2010    Past Medical History:  Diagnosis Date  . Allergic rhinitis, cause unspecified   . Anemia   . Anxiety   . Benign neoplasm of colon   . BPH (benign prostatic hypertrophy)   . COPD (chronic obstructive pulmonary disease) ([place])   . Coronary artery disease    Coronary calcification with negative ETT 2010;   ETT-Myoview (02/2014):  Normal stress nuclear study. No evidence of ischemia.  LV Ejection Fraction: 69%  . Degenerative joint disease   . Diverticulosis of colon (without mention of hemorrhage)   . Esophageal reflux   . Hearing loss   . Hyperplastic colon polyp 02/12/2011  . Irritable bowel syndrome   . Lumbar back pain   . Mixed hyperlipidemia   . Obstructive  sleep apnea (adult) (pediatric)   . Renal calculus   . Type II or unspecified type diabetes mellitus without mention of complication, not stated as uncontrolled   . Unspecified essential hypertension     Tobacco History: History  Smoking Status  . Former Smoker  . Packs/day: 1.00  . Years: 25.00  . Types: Cigarettes  . Quit date: 09/15/1979  Smokeless Tobacco  . Former Systems developer  . Quit date: 09/15/1979   Counseling given: Not Answered   Outpatient Encounter Prescriptions as of 09/08/2016  Medication Sig  . acetaminophen (TYLENOL) 500 MG tablet Take 500 mg by mouth every 6 (six) hours as needed.  [name] aspirin 81 MG tablet Take 81 mg by mouth daily.    [name] atenolol (TENORMIN) 25 MG tablet Take 1 tablet (25 mg total) by mouth daily.  . Cholecalciferol (VITAMIN D) 2000 UNITS CAPS Take 1 capsule by mouth daily.    . clotrimazole (MYCELEX) 10 MG troche Dissolve 1 in the mouth daily  . COMPRO 25 MG suppository PLACE 1 SUPPOSITORY (25 MG TOTAL) RECTALLY EVERY 12 (TWELVE) HOURS AS NEEDED FOR NAUSEA.  [name] dextromethorphan-guaiFENesin (MUCINEX DM) 30-600 MG per 12 hr tablet Take 1 tablet by mouth every 12 (twelve) hours.  . fluticasone (FLONASE) 50 MCG/ACT nasal spray Place 2 sprays into both nostrils daily.  . fluticasone-salmeterol (ADVAIR HFA) 115-21 MCG/ACT inhaler Inhale 1 puff into the lungs 2 (two) times daily.  [name]  folic acid (FOLVITE) 1 MG tablet Take 1 mg by mouth.  [name] glucose blood (ONE TOUCH ULTRA TEST) test strip TEST BLOOD SUGAR ONCE DAILY AS DIRECTED  . lidocaine (ASPERCREME W/LIDOCAINE) 4 % cream Apply 1 application topically as needed.  . loratadine (CLARITIN) 10 MG tablet Take 10 mg by mouth daily as needed for allergies.  [name] losartan (COZAAR) 100 MG tablet TAKE 1 TABLET (100 MG TOTAL) BY MOUTH DAILY.  . metFORMIN (GLUCOPHAGE) 500 MG tablet TAKE 1 TABLET TWICE A DAY WITH A MEAL  . methotrexate (RHEUMATREX) 2.5 MG tablet   . Multiple Vitamins-Minerals (MULTIVITAMIN WITH MINERALS) tablet  Take 1 tablet by mouth daily.    . niacin (NIASPAN) 500 MG CR tablet Take 1 tablet (500 mg total) by mouth at bedtime.  . nitroGLYCERIN (NITROSTAT) 0.4 MG SL tablet Place 1 tablet (0.4 mg total) under the tongue every 5 (five) minutes as needed for chest pain.  . pantoprazole (PROTONIX) 40 MG tablet Take 1 tablet (40 mg total) by mouth daily.  . polyethylene glycol powder (GLYCOLAX/MIRALAX) powder TAKE 17 GRAMS BY MOUTH TWICE DAILY AS DIRECTED  . prochlorperazine (COMPAZINE) 25 MG suppository Place 25 mg rectally every 12 (twelve) hours as needed.  . sodium chloride (OCEAN) 0.65 % nasal spray 1 spray by Nasal route as needed.    . tacrolimus (PROGRAF) 1 MG capsule Dissolve 1 capsule in 500 mL of water and swish as directed  . tamsulosin (FLOMAX) 0.4 MG CAPS capsule Take 1 capsule (0.4 mg total) by mouth 2 (two) times daily.  . traMADol (ULTRAM) 50 MG tablet TAKE 1 TABLET THREE TIMES A DAY AS NEEDED FOR PAIN  . Desoximetasone 0.05 % GEL APPLY TO AFFECTED AREA TWICE A DAY  . hyoscyamine (LEVSIN SL) 0.125 MG SL tablet Place 1 tablet (0.125 mg total) under the tongue every 4 (four) hours as needed. As needed for abdominal cramping (Patient not taking: Reported on 09/08/2016)  . [DISCONTINUED] fluconazole (DIFLUCAN) 100 MG tablet Take 1 daily for 3 days, then 1 weekly for 3 weeks.  . [DISCONTINUED] levofloxacin (LEVAQUIN) 500 MG tablet Take 1 tablet (500 mg total) by mouth daily. (Patient not taking: Reported on 09/08/2016)  . [DISCONTINUED] predniSONE (DELTASONE) 10 MG tablet Take 4 tabs for 2 days, them 3 tabs for 2 days 2 tabs for 2 days, then 1 tab for 2 days, then stop (Patient not taking: Reported on 09/08/2016)   No facility-administered encounter medications on file as of 09/08/2016.      Review of Systems  Constitutional:   No  weight loss, night sweats,  Fevers, chills, fatigue, or  lassitude.  HEENT:   No headaches,  Difficulty swallowing,  Tooth/dental problems, or  Sore throat,                 No sneezing, itching, ear ache,  +nasal congestion, post nasal drip,   CV:  No chest pain,  Orthopnea, PND, swelling in lower extremities, anasarca, dizziness, palpitations, syncope.   GI  No heartburn, indigestion, abdominal pain, nausea, vomiting, diarrhea, change in bowel habits, loss of appetite, bloody stools.   Resp:    No chest wall deformity  Skin: no rash or lesions.  GU: no dysuria, change in color of urine, no urgency or frequency.  No flank pain, no hematuria   MS:  No joint pain or swelling.  No decreased range of motion.  No back pain.    Physical Exam  BP 132/80 (BP Location: Right Arm, Patient Position:  Sitting, Cuff Size: Normal)   Pulse 77   Ht 6\' 1"  (1.854 m)   Wt 220 lb (99.8 kg)   SpO2 98%   BMI 29.03 kg/m   GEN: A/Ox3; pleasant , NAD, elderly    HEENT:  [place]/AT,  EACs-clear, TMs-wnl, NOSE-clear drainage , THROAT-clear, no lesions, no postnasal drip or exudate noted.   NECK:  Supple w/ fair ROM; no JVD; normal carotid impulses w/o bruits; no thyromegaly or nodules palpated; no lymphadenopathy.    RESP  Decreased BS in bases ,  no accessory muscle use, no dullness to percussion  CARD:  RRR, no m/r/g, tr  peripheral edema, pulses intact, no cyanosis or clubbing.  GI:   Soft & nt; nml bowel sounds; no organomegaly or masses detected.   Musco: Warm bil, no deformities or joint swelling noted.   Neuro: alert, no focal deficits noted.    Skin: Warm, no lesions or rashes  Psych:  No change in mood or affect. No depression or anxiety.  No memory loss.  Lab Results:  CBC    Component Value Date/Time   WBC 5.1 10/15/2015 1023   RBC 4.61 10/15/2015 1023   HGB 13.3 10/15/2015 1023   HCT 41.0 10/15/2015 1023   PLT 193.0 10/15/2015 1023   MCV 88.9 10/15/2015 1023   MCH 29.8 11/29/2011 2120   MCHC 32.5 10/15/2015 1023   RDW 13.5 10/15/2015 1023   LYMPHSABS 1.1 10/15/2015 1023   MONOABS 0.4 10/15/2015 1023   EOSABS 0.1 10/15/2015 1023    BASOSABS 0.0 10/15/2015 1023    BMET    Component Value Date/Time   NA 141 10/15/2015 1023   K 5.0 10/15/2015 1023   CL 106 10/15/2015 1023   CO2 27 10/15/2015 1023   GLUCOSE 134 (H) 10/15/2015 1023   BUN 18 10/15/2015 1023   CREATININE 0.89 10/15/2015 1023   CALCIUM 9.9 10/15/2015 1023   GFRNONAA >90 11/29/2011 2120   GFRAA >90 11/29/2011 2120    BNP No results found for: BNP  ProBNP    Component Value Date/Time   PROBNP 51.2 10/21/2010 1638    Imaging: Dg Chest 2 View  Result Date: 08/10/2016 CLINICAL DATA:  Two day history of cough and congestion EXAM: CHEST  2 VIEW COMPARISON:  October 10, 2014 FINDINGS: There is no edema or consolidation. The heart size and pulmonary vascularity are normal. No adenopathy. There is atherosclerotic calcification in the aorta. There is lower thoracic dextroscoliosis. There is degenerative change in the thoracic spine. IMPRESSION: Aortic atherosclerosis.  No edema or consolidation. Electronically Signed   By: Lowella Grip III M.D.   On: 08/10/2016 10:54     Assessment & Plan:   COPD exacerbation Recent flare now improving   Plan  There are no Patient Instructions on file for this visit.   Allergic rhinitis Flare now resolving   Plan  There are no Patient Instructions on file for this visit.      Rexene Edison, NP 09/08/2016

## 2016-09-08 NOTE — Patient Instructions (Addendum)
Continue on Advair .  Saline nasal rinses  Continue on Claritin and Flonase  Mucinex DM Twice daily  .As needed  Cough/congestion  Follow up with Derm as planned  follow up Dr. Lenna Gilford  In 2 months and As needed   Please contact office for sooner follow up if symptoms do not improve or worsen or seek emergency care

## 2016-09-08 NOTE — Assessment & Plan Note (Addendum)
Recent flare now improving   Plan  Patient Instructions  Continue on Advair .  Saline nasal rinses  Continue on Claritin and Flonase  Mucinex DM Twice daily  .As needed  Cough/congestion  Follow up with Derm as planned  follow up Dr. Lenna Gilford  In 2 months and As needed   Please contact office for sooner follow up if symptoms do not improve or worsen or seek emergency care

## 2016-09-21 DIAGNOSIS — Z79899 Other long term (current) drug therapy: Secondary | ICD-10-CM | POA: Diagnosis not present

## 2016-09-21 DIAGNOSIS — L439 Lichen planus, unspecified: Secondary | ICD-10-CM | POA: Diagnosis not present

## 2016-09-21 DIAGNOSIS — L438 Other lichen planus: Secondary | ICD-10-CM | POA: Diagnosis not present

## 2016-09-21 DIAGNOSIS — Z5181 Encounter for therapeutic drug level monitoring: Secondary | ICD-10-CM | POA: Diagnosis not present

## 2016-10-15 ENCOUNTER — Ambulatory Visit (INDEPENDENT_AMBULATORY_CARE_PROVIDER_SITE_OTHER): Payer: Medicare Other | Admitting: Pulmonary Disease

## 2016-10-15 ENCOUNTER — Other Ambulatory Visit (INDEPENDENT_AMBULATORY_CARE_PROVIDER_SITE_OTHER): Payer: Medicare Other

## 2016-10-15 ENCOUNTER — Encounter: Payer: Self-pay | Admitting: Pulmonary Disease

## 2016-10-15 VITALS — BP 116/60 | HR 70 | Temp 97.3°F | Ht 73.75 in | Wt 215.5 lb

## 2016-10-15 DIAGNOSIS — K3184 Gastroparesis: Secondary | ICD-10-CM

## 2016-10-15 DIAGNOSIS — E782 Mixed hyperlipidemia: Secondary | ICD-10-CM

## 2016-10-15 DIAGNOSIS — E538 Deficiency of other specified B group vitamins: Secondary | ICD-10-CM

## 2016-10-15 DIAGNOSIS — I10 Essential (primary) hypertension: Secondary | ICD-10-CM

## 2016-10-15 DIAGNOSIS — N139 Obstructive and reflux uropathy, unspecified: Secondary | ICD-10-CM | POA: Diagnosis not present

## 2016-10-15 DIAGNOSIS — M159 Polyosteoarthritis, unspecified: Secondary | ICD-10-CM

## 2016-10-15 DIAGNOSIS — N401 Enlarged prostate with lower urinary tract symptoms: Secondary | ICD-10-CM | POA: Diagnosis not present

## 2016-10-15 DIAGNOSIS — I2510 Atherosclerotic heart disease of native coronary artery without angina pectoris: Secondary | ICD-10-CM

## 2016-10-15 DIAGNOSIS — J449 Chronic obstructive pulmonary disease, unspecified: Secondary | ICD-10-CM

## 2016-10-15 DIAGNOSIS — E0590 Thyrotoxicosis, unspecified without thyrotoxic crisis or storm: Secondary | ICD-10-CM

## 2016-10-15 DIAGNOSIS — E119 Type 2 diabetes mellitus without complications: Secondary | ICD-10-CM

## 2016-10-15 DIAGNOSIS — F411 Generalized anxiety disorder: Secondary | ICD-10-CM | POA: Diagnosis not present

## 2016-10-15 DIAGNOSIS — M150 Primary generalized (osteo)arthritis: Secondary | ICD-10-CM

## 2016-10-15 DIAGNOSIS — N138 Other obstructive and reflux uropathy: Secondary | ICD-10-CM

## 2016-10-15 DIAGNOSIS — D649 Anemia, unspecified: Secondary | ICD-10-CM

## 2016-10-15 DIAGNOSIS — L439 Lichen planus, unspecified: Secondary | ICD-10-CM | POA: Diagnosis not present

## 2016-10-15 LAB — CBC WITH DIFFERENTIAL/PLATELET
BASOS PCT: 1.1 % (ref 0.0–3.0)
Basophils Absolute: 0.1 10*3/uL (ref 0.0–0.1)
EOS ABS: 0.1 10*3/uL (ref 0.0–0.7)
Eosinophils Relative: 1.3 % (ref 0.0–5.0)
HCT: 41.4 % (ref 39.0–52.0)
Hemoglobin: 13.9 g/dL (ref 13.0–17.0)
Lymphocytes Relative: 22.4 % (ref 12.0–46.0)
Lymphs Abs: 1.1 10*3/uL (ref 0.7–4.0)
MCHC: 33.7 g/dL (ref 30.0–36.0)
MCV: 91.6 fl (ref 78.0–100.0)
MONO ABS: 0.4 10*3/uL (ref 0.1–1.0)
Monocytes Relative: 7.4 % (ref 3.0–12.0)
NEUTROS ABS: 3.2 10*3/uL (ref 1.4–7.7)
Neutrophils Relative %: 67.8 % (ref 43.0–77.0)
PLATELETS: 180 10*3/uL (ref 150.0–400.0)
RBC: 4.51 Mil/uL (ref 4.22–5.81)
RDW: 14.8 % (ref 11.5–15.5)
WBC: 4.8 10*3/uL (ref 4.0–10.5)

## 2016-10-15 LAB — COMPREHENSIVE METABOLIC PANEL
ALT: 15 U/L (ref 0–53)
AST: 18 U/L (ref 0–37)
Albumin: 4.5 g/dL (ref 3.5–5.2)
Alkaline Phosphatase: 80 U/L (ref 39–117)
BUN: 21 mg/dL (ref 6–23)
CHLORIDE: 105 meq/L (ref 96–112)
CO2: 29 meq/L (ref 19–32)
CREATININE: 0.92 mg/dL (ref 0.40–1.50)
Calcium: 10.2 mg/dL (ref 8.4–10.5)
GFR: 84.33 mL/min (ref >60.00)
Glucose, Bld: 136 mg/dL — ABNORMAL HIGH (ref 70–99)
POTASSIUM: 5.3 meq/L — AB (ref 3.5–5.1)
SODIUM: 140 meq/L (ref 135–145)
Total Bilirubin: 0.6 mg/dL (ref 0.2–1.2)
Total Protein: 7 g/dL (ref 6.0–8.3)

## 2016-10-15 LAB — PSA: PSA: 1.45 ng/mL (ref 0.10–4.00)

## 2016-10-15 LAB — HEMOGLOBIN A1C: Hgb A1c MFr Bld: 6.7 % — ABNORMAL HIGH (ref 4.6–6.5)

## 2016-10-15 LAB — TSH: TSH: 1.04 u[IU]/mL (ref 0.35–4.50)

## 2016-10-15 LAB — VITAMIN B12: VITAMIN B 12: 504 pg/mL (ref 211–911)

## 2016-10-15 LAB — IBC PANEL
IRON: 169 ug/dL — AB (ref 42–165)
Saturation Ratios: 42.1 % (ref 20.0–50.0)
TRANSFERRIN: 287 mg/dL (ref 212.0–360.0)

## 2016-10-15 LAB — LIPID PANEL
Cholesterol: 154 mg/dL (ref 0–200)
HDL: 51.1 mg/dL (ref >39.00)
LDL Cholesterol: 90 mg/dL (ref 0–99)
NONHDL: 102.87
TRIGLYCERIDES: 66 mg/dL (ref 0.0–149.0)
Total CHOL/HDL Ratio: 3
VLDL: 13.2 mg/dL (ref 0.0–40.0)

## 2016-10-15 LAB — MAGNESIUM: Magnesium: 2.1 mg/dL (ref 1.5–2.5)

## 2016-10-15 MED ORDER — LOSARTAN POTASSIUM 100 MG PO TABS: ORAL_TABLET | ORAL | 3 refills | Status: DC

## 2016-10-15 MED ORDER — HYOSCYAMINE SULFATE 0.125 MG SL SUBL: 0.1250 mg | SUBLINGUAL_TABLET | SUBLINGUAL | 5 refills | Status: DC | PRN

## 2016-10-15 MED ORDER — POLYETHYLENE GLYCOL 3350 17 GM/SCOOP PO POWD: ORAL | 5 refills | Status: DC

## 2016-10-15 MED ORDER — FAMOTIDINE 40 MG PO TABS: 40.0000 mg | ORAL_TABLET | Freq: Every day | ORAL | 3 refills | Status: DC

## 2016-10-15 NOTE — Progress Notes (Signed)
Subjective:    Patient ID: Jesse Santos, male    DOB: [birth year], 81 y.o.   MRN: [id number]  HPI 81 y/o WM w/ mult med problems here for a follow up visit>  ~  SEE PREV EOPIC NOTES FOR OLDER DATA >>     LABS 1/14:  FLP- at goals on Niacin rx;  Chems- ok w/ BS=134, A1c=7.5;  CBC- wnl;  TSH=1.13;  VitD=42;  PSA=1.10      LABS 7/14:  Chems- ok x BS=140, A1c=7.3...   1/15> [name] reports that DrDuda plans a left THR (for avasc necreosis) whenever he is ready, but he's not rushing it, on Tramadol prn & doing satis for now...   LABS 1/15:  FLP- at goals on diet+Niaspan;  Chesm- ok w/ BS=129, A1c=7.0;  CBC- wnl;  TSH=1.06...  EKG 4/15 showed NSR, rate70, wnl, NAD...  Treadmill 6/15 (DrBrackbill) showed borderline ST segment changes w/ exercise & a run of atrial tachy reported...   Myoview 6/15 showed good exerc capacity, norm BP response, no CP, no evid ischemia, decr uptake inferiorly on rest images similar to 2013, EF=69%, norm wall motion...  LABS 7/15:  Chems- ok w/ BS=126, A1c=7.1.Marland KitchenMarland Kitchen rec to continue Metform500Bid & get on diet, incr exerc, get wt down!!!   ~  October 10, 2014:  79mo ROV & [name] indicates it took several wks to get over his URI/AB episode in Oct;      On Advair115-2spBid, Mucinex, flonase, claritin; he reports breathing is stable- min cough, no sput, stable DOE, no edema...     He remains on Niasp500/d; FLP 1/16 showed TChol 150, TG 101, HDL 45, LDL 85    He continues Metform500Bid for his DM; Labs 1/16 showed BS=133, A1c=7.2    He has some lower abd pain/ cramping & has used the Levsin w/ relief- c/w IBS, some diarrhea, nausea, groin discomfort; he had colonoscopy 2012 w/ 2 polyps removed & f/u planned 2017...     He sees DrDuda for Ortho & takes Tramadol50 aS NEEDED... We reviewed prob list, meds, xrays and labs> see below for updates >>   CXR 1/16 showed norm heart size, clear lungs, DJD in Tspine, NAD...  LABS 1/16:  FLP- at goals on Niasp;  Chems- ok w/ BS=133  A1c=7.2;  CBC- wnl;  TSH=1.59;  PSA=1.25...   ~  April 10, 2015:  22mo ROV & [name] reports stable w/ CC= arthritis pain in shoulders/ hips, he sees DrDuda, told he needs left THR but he is holding off, Tramadol helps;  We reviewed the following medical problems during today's office visit >>    HOH> he has bilat hearing aides...    AR> on allergy shots, OTC antihist, Saline, Flonase; gets occas bouts of sinusitis but overall improved & no recent infections reported...     OSA> Sleep Study was in 2/04 w/ RDI=45 & desat to 88% w/ mod snoring & leg jerks, but he states he never could use the CPAP effectively & states he sleeps fine now if he stays on his side; denies daytime sleepiness or other issues...    COPD> exsmoker quit 1981; on AdvairHFA115-2spBid, Mucinex prn; he doesn't like Pred Rx; he had Bronchitic exac treated 10/15 w/ Levaquin, Medrol, Mucinex, etc & improved; needs incr exercise program...    HBP> on Aten25, Losar100; BP=120/70, he denies CP, palpit, dizzy, SOB, edema, etc...    CAD> on ASA81; followed by DrHochrein- CT Abd 2010 showed coronary calcif, Treadmill was neg; Myoview 3/13 was neg,  wnl; last seen 4/15- no new symptoms, active w/ yard work, EKG showed wnl, they decided to do Treadmill (borderline ST depression), then Myoview 6/15 showed NEG- no ischemia, decr uptake inferiorly same as 2013, EF=69%, norm wall motion, low risk...    Hyperlipid> on Niacin'500mg'$ ; FLP 1/16 showed TChol 150, TG 101, HDL 45, LDL 85; continue same, get wt down...    DM> on Metform500Bid; wt=222#, BMI=29; Labs 7/16 showed BS=129, A1c=6.7; rec to continue same med, better diet, get wt down!..    GI- GERD, dysphagia, Divertics, IBS, Polyps> on Protonix40, Miralax, Levsin0.125 prn; followed by DrPerry & stable- last colon 5/12 w/ severe divertics & 2 sm adenomas removed...    GU- Kid stone, BPH> on Flomax0.4 & Cialis prn; PSA remains wnl (PSA 1/16= 1.25)...    DJD, LBP> left knee complaints w/ shots from DrDuda,  similar for left shoulder; hx bilat hip AVN noted on prev CT Abd but he has min complaints and uses Tylenol prn (holding off on surg)...    Anxiety> he does not want anxiolytic meds.[name] EXAM shows Afeb, VSS, O2sat=97%;  Heent- neg, mallampati2;  Chest- clear w/o w/r/r;  Heart- RR w/o m/r/g;  Abd- soft, neg, nontender;  Ext- neg w/o c/c/e;  Neuro- intact...  Labs 7/16>  Chems- ok w/ BS=129, A1c=6.7, Cr=0.83... IMP/PLAN>>  [name] is stable, meds refilled, he's already had the 2016 Flu vaccine & up to date;  We discussed diet/ exercise/ wt reduction... ROV in 80mo  ~  October 15, 2015:  620moOV & [name] saw TP 07/2015 for a lesion on tongue- sent to Derm DrLupton ?lichenoid mucositis (rx w/ several steroid shots & topical meds) & may need [facility] [employer]. He has mult somatic complaints> LBP, leg weakness, HAs, cough/ congestion/ clear mucous (yet he states exercise on treadmill for 3028mat 2.6mp29m.. We reviewed the following medical problems during today's office visit >>     HOH> he has bilat hearing aides...    AR> on allergy shots per DrVanWinkle, OTC antihist, Saline, Flonase; gets occas bouts of sinusitis but overall improved & no recent infections reported...     OSA> Sleep Study 2/04 w/ RDI=45 & desat to 88% w/ mod snoring & leg jerks, but he states he never could use the CPAP effectively & states he sleeps fine now if he stays on his side; denies daytime sleepiness or other issues...    COPD> exsmoker quit 1981; on AdvairHFA115-2spBid, Mucinex prn; he doesn't like Pred Rx; prev Bronchitic exac treated w/ Levaquin, Medrol, Mucinex & improved; needs incr exercise program...    HBP> on Aten25, Losar100; BP=126/70, he denies CP, palpit, dizzy, SOB, edema, etc...    CAD> on ASA81; followed by DrHochrein- CT Abd 2010 showed coronary calcif, Treadmill was neg; Myoview 3/13 was neg, wnl; last seen 4/15- no new symptoms, active w/ yard work, EKG showed wnl, they decided to do Treadmill (borderline ST  depression), then Myoview 6/15 showed NEG- no ischemia, decr uptake inferiorly same as 2013, EF=69%, norm wall motion, low risk...    Hyperlipid> on Niacin'500mg'$ ; FLP 1/17 showed TChol 132, TG 52, HDL 46, LDL 75; continue same, get wt down...    DM> on Metform500Bid; wt=220#, BMI=29; Labs 7/16 showed BS=129, A1c=6.7; rec to continue same med, better diet, get wt down; Labs 1/17 showed BS=134, A1c=6.8...  Marland KitchenMarland KitchenGI- GERD, dysphagia, Divertics, IBS, Polyps> on Protonix40, Miralax, Levsin0.125 prn; followed by DrPerry & stable- last colon 5/12 w/ severe divertics & 2 sm adenomas  removed...    GU- Kid stone, BPH> on Flomax0.4 & Cialis prn; PSA remains wnl (PSA 1/16= 1.25, 1/17=1.06)...    DJD, LBP> left knee complaints w/ shots from DrDuda, similar for left shoulder; hx bilat hip AVN noted on prev CT Abd but he has min complaints and uses Tylenol prn (holding off on surg)...    Anxiety> he does not want anxiolytic meds.[name] EXAM shows Afeb, VSS, O2sat=98%;  Heent- neg, mallampati2;  Chest- clear w/o w/r/r;  Heart- RR w/o m/r/g;  Abd- soft, neg, nontender;  Ext- neg w/o c/c/e;  Neuro- intact...  LABS 10/15/15>  FLP- at goals on diet + niacin;  Chems- ok x BS=134, A1c=6.8 on Metform500bid;  CBC- wnl;  TSH=1.08;  PSA=1.06 IMP/PLAN>>  [name] has mult somatic complaints but objectively in good shape; we discussed ENT 2nd opinion re tongue lesion vs Med [facility] referral per Derm, he will decide; continue present Rx & ROV 34mo..  ~  April 13, 2016:  667moOV and after the last visit [name] saw DrShoemaker, ENT 10/29/15 for the lesion on his tongue- his note is reviewed in Care Everywhere, he concurred w/ topical steroid therapy for this chronic tongue irritation & gave him Triamcinolone cream;  DrLupton [facility] 04/01/16 7 note reviewed, bx proven lichenoid dermatitis, exam showed large erosions on lat border of tongue c/w oral erosive lichen planus; He rec Diflucan, Mycelex (for prob candida) &  Tacrolimus (Prograf) to dissolve in water & swish as directed=> pt indicates that he is improved, and they are planning f/u eval... we reviewed the following medical problems during today's office visit >>     HOH> he has bilat hearing aides & has been checked by DrShoemaker.    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    AR> on allergy shots per DrVanWinkle, OTC antihist, Saline, Flonase; gets occas bouts of sinusitis but overall improved & no recent infections reported...     OSA> Sleep Study 2/04 w/ RDI=45 & desat to 88% w/ mod snoring & leg jerks, but intol to CPAP & never used; states he sleeps fine now if he stays on his side; denies daytime sleepiness or other issues, naps 1/7...    COPD> exsmoker quit 1981; on AdvairHFA115-2spBid w/ spacer, Mucinex prn; he doesn't like Pred Rx; prev Bronchitic exac treated w/ antibiotics (Levaquin/ Augmentin), Medrol, Mucinex & improved; needs incr exercise program...    HBP> on Aten25, Losar100; BP=142/68, he denies CP, palpit, dizzy, SOB, edema, etc...    CAD> on ASA81; followed by DrHochrein- seen 12/19/15, CT Abd 2010 showed coronary calcif, Treadmill was neg (borderline ST depression); Myoview 3/13 was neg, wnl & Myoview 6/15 showed NEG- no ischemia, decr uptake inferiorly same as 2013, EF=69%, norm wall motion, low risk... He denies CP, palpit, dizzy, SOB, edema, etc; he remains active w/ yard work etc...    Hyperlipid> on Niacin50026mFLP 1/17 showed TChol 132, TG 52, HDL 46, LDL 75; continue same, get wt down...    DM> on Metform500Bid; wt=218#, BMI=29; Labs 7/16 showed BS=129, A1c=6.7; rec to continue same med, better diet, get wt down; Labs 1/17 showed BS=134, A1c=6.8 and A1c 04/13/16= 6.7, stable...    GI- GERD, dysphagia, Divertics, IBS, Polyps> on Protonix40, Miralax, Levsin0.125 prn; followed by DrPerry & stable- last colon 5/12 w/ severe divertics & 2 sm adenomas  removed...    GU- Kid stone, BPH> on Flomax0.4 &  Cialis prn; PSA remains wnl (PSA 1/16= 1.25, 1/17=1.06)...    DJD, LBP> left knee complaints w/ shots from DrDuda, similar for left shoulder; hx bilat hip AVN noted on prev CT Abd but he has min complaints and uses Tylenol prn (holding off on surg)...    Anxiety> he does not want anxiolytic meds.[name] EXAM shows Afeb, VSS, O2sat=98%;  Heent- neg, mallampati2;  Chest- clear w/o w/r/r;  Heart- RR w/o m/r/g;  Abd- soft, neg, nontender;  Ext- neg w/o c/c/e;  Neuro- intact...  LABS 04/13/16:  BMet- ordered but not done;  A1c=6.7.Marland KitchenMarland Kitchen IMP/PLAN>>  [name] is stable w/ his mult medical issues as above- oral lichen planus improved on regimen from Derm at [facility]; BP controlled, he remains active w/o angina, FLP & DM well regualted=> continue same meds.   ~  October 15, 2016:  8mo ROV & [name] was seen by SG 07/2016 & TP 08/2016 w/ cough prod of green mucus, sinus congestion, but no f/c/s; he was placed on MTX by DrJorizzo, [facility] due to his tongue lesion felt to be oral lichen planus, 2=> incr to 3 tabs once per week along w/ Folate, Mycelex and Prograf (1 cap dissolved in water & swished);  He remains on AdvairHFA-2spBid & he's careful to rinse after these treatments too;  He was treated w/ Levaquin + Pred taper and he followed up w/ TP 09/08/16-- improved w/ decr cough & dyspnea, no wheezing, remains on Advair, flonase, claritin, mucinex... we reviewed the following medical problems during today's office visit >>     HOH> he has bilat hearing aides & has been checked by DrShoemaker.    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    AR> on allergy shots per DrVanWinkle, OTC antihist, Saline, Flonase; gets occas bouts of sinusitis but overall improved & no recent infections reported...     OSA> Sleep Study 2/04 w/ RDI=45 & desat to 88% w/ mod  snoring & leg jerks, but intol to CPAP & never used; states he sleeps fine now if he stays on his side; denies daytime sleepiness or other issues, naps 1/7...    COPD> exsmoker quit 1981; on AdvairHFA115-2spBid w/ spacer, Mucinex prn; he doesn't like Pred Rx; prev Bronchitic exac treated w/ antibiotics (Levaquin/ Augmentin), Medrol, Mucinex & improved; needs incr exercise program...    HBP> on Aten25, Losar100; BP=116/60, he denies CP, palpit, dizzy, SOB, edema, etc...    CAD> on ASA81; followed by DrHochrein- seen 12/19/15, CT Abd 2010 showed coronary calcif, Treadmill was neg (borderline ST depression); Myoview 3/13 was neg, wnl & Myoview 6/15 showed NEG- no ischemia, decr uptake inferiorly same as 2013, EF=69%, norm wall motion, low risk... He denies CP, palpit, dizzy, SOB, edema, etc; he remains active w/ yard work etc...    Hyperlipid> on Niacin500mg ; FLP 1/17 showed TChol 132, TG 52, HDL 46, LDL 75; continue same, get wt down...    DM> on Metform500Bid; wt=216#, BMI=29; Labs 1/17 showed BS=134, A1c=6.8; rec to continue same med, better diet, get wt down; Labs 2/18 showed BS=136, A1c=6.7; stable...    GI- GERD, dysphagia, Divertics, IBS, Polyps> on Protonix40, Miralax, Levsin0.125 prn; followed by DrPerry & stable- last colon 5/12 w/ severe divertics & 2 sm adenomas removed; he wants off PPI=> change to PEPCID40...    GU- Kid stone, BPH> on Flomax0.4 & Cialis prn; PSA remains wnl (PSA 1/16= 1.25, 1/17=1.06,  2/18=1.45)...    DJD, LBP> left knee complaints w/ shots from DrDuda, similar for left shoulder; hx bilat hip AVN noted on prev CT Abd but he has min complaints and uses Tylenol prn (holding off on surg)...    Anxiety> he does not want anxiolytic meds.[name] EXAM shows Afeb, VSS, O2sat=99%;  Wt=216#, 6'2"Tall, BMI=28;  Heent- neg, mallampati2;  Chest- clear w/o w/r/r;  Heart- RR w/o m/r/g;  Abd- soft, neg, nontender;  Ext- neg w/o c/c/e;  Neuro- intact...  CXR 08/10/16>  Norm heart size, atherosclerotic  Ao, clear lungs- NAD, degen changes in Tspine w/ some scoliosis...  LABS 10/15/16>  FLP- at goals on Niaspan;  Chems- ok w/ BS=136, A1c=6.7;  CBC- wnl w/ Hg=13.9, Fe=169 (42%sat), B12=504;  TSH=1.04,  PSA=1.45...  IMP/PLAN>>  [name] is still dealing w/ his oral prob= Lichen Planus & improved w/ treatment program under the direction of [facility]Jorizzo at [facility]- on MTX 3/wk, Prograf, Mycelex;  His wife is concerned about his being on Protonix40 for many yrs, followed by DrPerry w/ GERD/ dysphagia, last EGD 2009 was essentially normal- OK to stop the PPI & change to Pepcid40;  Continue other meds the same + diet/ exercise...          Problem List:  HEARING LOSS (ICD-389.9) - he has bilat hearing aides...  ALLERGIC RHINITIS (ICD-477.9) - on allergy shots from DrESL Gaetano Hawthorne)... plus Claritin, Saline, Mucinex, Flonase. ~  occas bouts of sinusitis requiring antibiotic Rx... ~  8/13: he had allergy f/u DrVanWinkle> skin testing 2010 pos to dust mites and molds; doing well on shots once/mo; stable & no changes made... ~  3/14: he presented w/ an upper resp infection & treated w/ Depo, Dosepak, Zithromax... ~  8/14:  He had allergy f/u DrVanWinkle> Advair, Flonase, Saline, Mucinex; he checked FENO- reported normal... ~  7/15: stable on allergy meds from DrVanWinkle, plus AdvairHFA115; he had sinus/ AB exac 5/15 treated by TP w/ ZPak=> Levaquin & resolved...  OBSTRUCTIVE SLEEP APNEA (ICD-327.23) - sleep study 8/04 showed RDI= 45 & desat to 88%... mod snoring and leg jerks without sleep disruption... CPAP perscribed but not using it now- states he can't sleep w/ it on & furthermore he is resting well as long as he stays on his side... denies daytime hypersomnolence & not interested in re-evaluation.  COPD (ICD-496) - ex-smoker, quit 1981 >>  ~  on ADVAIR 100Bid, PROAIR Prn, MUCINEX Bid... he had Pneumovax in 2008... doesn't like Pred Rx...  ~  10/11:  doing well except recent cough, min green sputum (no change in  dyspnea)- we will Rx w/ Augmentin, incr Mucinex/ Fluids. ~  4/12:  Breathing at baseline, continue maintenance meds... ~  CXR 12/13 showed normal heart size, clear lungs, NAD... ~  1/14:  He's had 2 exac this past yr- saw TP w/ Rx & improved... ~  1/15:  He continues to do well on Advair + allergy Rx from DrVanWinkle w/ Flonase, Saline, Mucinex... ~  7/15: exsmoker quit 1981; on AdvairHFA115-2spBid, Mucinex prn; he doesn't like Pred Rx; he had Bronchitic exac treated by TP 5/15 w/ ZPak, then Levaquin, Mucinex, etc & improved; needs incr exercise program... ~  10/15: seen w/ refractory bronchitic episode reqiring ZPak=>Levaquin, Medrol, Mucinex, Hydromet, etc... ~  1/16: He had a URI/ AB exac treated 10/15 w/ Levaquin/ Medrol/ Mucinex & resolved; now at baseline on Advair115-2spBid etc...  HYPERTENSION (ICD-401.9) - prev on diet alone> then on Diovan320, but we decided to change to LOSARTAN 100mg /d 5/13  to save $$ ~  He claims that Norvasc & HCTZ "stopped my urine flow"... ~  10/11:  BP= 128/80 & even better at home he says... denies HA, fatigue, visual changes, CP, palipit, dizziness, syncope, dyspnea, edema, etc... ~  4/12:  Pt has seen TP, DrHochrein, & checking BP daily at home; tried on Norvasc but claims it decr his urine flow; now on Diovan & improved w/ BP= 132/70... ~  5/12:  BP remains well controlled on Diovan 320mg /d= 120/62 today. ~  11/12:  BP= 142/82 on Diovan160 now; denies CP, palpit, dizzy, SOB, edema... ~  5/13:  BP= 128/66 & he's requesting change to cheaper med; rec switch Diovan to LOSARTAN 100mg /d... ~  1/14: on Losar100; BP=140/84, he notes incr HR he says & we decided to add low dose ATENOLOL25mg /d... ~  7/14: on Aten25, Losar100> BP=128/72 & he denies CP, palpit, SOB, edema, etc... ~  1/15: BP controlled on Aten25, Losar100 w/ BP= 130/80 & he denies CP, palpit, SOB, edema. ~  7/15: on Aten25, Losar100; BP=124/70, he denies CP, palpit, dizzy, SOB, edema, etc. ~  10/15: on  Aten25, Losar100; BP= 140/60 & remains stable hemodynamically... ~  BP remains under good control on same meds...  R/O CAD (ICD-414.00) - on ASA 81mg /d... Followed by DrHochrein & his notes are reviewed. ~  hx neg cardiac cath 1988 by DrJoe Wetmore... ~  NuclearStressTest 10/02 was norm- no scar or ischemia, EF=55%. ~  CT Abd 4/10 via ER showed coronary calcif... referred to Cards. ~  Eval by DrHochrein 6/10 showed norm EKG & cardiac exam... treadmill ordered. ~  Treadmill 6/10 showed reasonable exerc tolerance, no ischemic changes, few PVCs, sl incr BP... ~  EKG 3/13 showed NSR, rate76, WNL, NAD... ~  Myoview 3/13 was neg> exercised for 3min, stopped for fatigue & CP; no ST seg changes, occas PACs & PVCs; hypertensive BP response; no ischemia, EF=71%, normal wall motion... ~  He saw DrHochrein 4/14> hx CAD, doing satis on ASA & BP rx w/o angina & rec to follow program of aggressive risk factor reduction, no change in meds.  ~  EKG 4/14 showed NSR, rate60, wnl, NAD.. ~  6/15: .on ASA81; followed by DrHochrein- last seen 4/15- no new symptoms, active w/ yard work, EKG showed wnl, they decided to do Treadmill (borderline ST depression), then Myoview 6/15 showed NEG- no ischemia, decr uptake inferiorly same as 2013, EF=69%, norm wall motion, low risk...  MIXED HYPERLIPIDEMIA (ICD-272.2) - low HDL and NIASPAN 500mg /d started 4/09... ~  [place] 4/09 showed TChol 160, TG 106, HDL 26, LDL 113... rec- diet + Niaspan 500/d. ~  FLP 10/09 showed TChol 155, TG 78, HDL 33, LDL 106... ~  FLP 4/10 showed TChol 133, TG 63, HDL 30, LDL 91... rec> continue same. ~  FLP 4/11 showed TChol 144, TG 61, HDL 38, LDL 94 ~  FLP 10/11 showed TChol 155, TG 81, HDL 40, LDL 99 ~  FLP 4/12 showed TChol 142, TG 69, HDL 38, LDL 90 ~  FLP 11/12 showed TChol 165, TG 82, HDL 43, LDL 106 ~  FLP 5/13 in Niasp500 showed TChol 155, TG 72, HDL 46, LDL 95... rec change to generic. ~  FLP 1/14 on Niacin500 showed TChol 140, TG 56, HDL 39,  LDL 90 ~  FLP 1/15 on Niaspan500 showed TChol 148, TG 57, HDL 43, LDL 94  ~  FLP 1/16 on Niaspan500 showed TChol 150, TG 101, HDL 45, LDL 85 ~  FLP  1/17 on Niaspan500 showed TChol 132, TG 52, HDL 46, LDL 75  DM (ICD-250.00) - on diet + METFORMIN 500mg Bid... he reports BS at home all in the 120-130's... ~  labs 4/09 showed BS= 132, HgA1c= 6.6.Marland KitchenMarland Kitchen rec- same med, better diet... ~  labs 10/09 showed BS= 131, HgA1c= 6.3.Marland KitchenMarland Kitchen ~  labs 4/10 showed BS= 117, HgA1c= 6.2.[name]. rec> same meds/ diet Rx. ~  labs 10/10 showed BS= 138, A1c= 6.5 ~  labs 4/11 showed BS= 121, A1c= 6.5.Marland KitchenMarland Kitchen On Metform500Bid + diet. ~  Dilated eye exam 5/11 by DrShapiro- no retinopathy... ~  labs 10/11 (wt=228#) showed BS= 118, A1c= 7.1.[name]. not as good- get on diet or more meds. ~  Labs 4/12 (wt=229#) showed BS= 119, A1c= 7.3... Wrong direction, may need more meds, get wt down! ~  5/12:  Ophthalmology check by DrShapiro was neg- no retinopathy... ~  Labs 11/12 showed BS= 136, A1c= 7.0 ~  Labs 5/13 on MetformBid showed BS= 132, A1c= 7.1.Marland KitchenMarland Kitchen Continue same, get wt down. ~  Ophthalmology check from DrShapiro 5/13> no DM retinopathy... ~  Labs 1/14 showed BS= 134, A1c= 7.5; not as good- needs better diet, get wt down, same meds for now... ~  Labs 7/14 on Metform500Bid showed BS= 140, A1c= 7.3  ~  Labs 1/154 on Metform500Bid showed BS= 129 & A1c= 7.0, and we reviewed meds/ diet/ exercise/ weight... ~  Labs 7/15 on Metform500Bid showed BS= 126, A1c= 7.1 & reminded of need for diet/ exerc/ wt reduction... ~  Labs 1/16 on Metform500Bid showed BS= 133, A1c= 7.2 ~  Labs 1/17 on Metform500Bid showed BS= 134, A1c= 6.8  GERD (ICD-530.81) & ? GASTROPARESIS - on PROTONIX 40mg /d, & off Reglan per DrPerry... ~   last EGD 6/07 by DrPerry showed gastic polyp... ~  8/10:  given Compazine suppos for gastroparesis flair by DrGessner. ~  4/12:  C/o intermittent dysphagia & referred back to GI for further eval (colonoscopy due as well)...  DIVERTICULOSIS OF  COLON (ICD-562.10) - he uses MIRALAX Bid regularly... IRRITABLE BOWEL SYNDROME (ICD-564.1) COLONIC POLYPS (ICD-211.3) ~  last colonoscopy 5/07 by DrSam showed divertics only... f/u 16yrs. ~  CT Abd 4/10 in ER showed atx bases, coronary calcif, s/p GB, abdAo calcif, 71mm renal stone, divertics, bilat hip AVN. ~  Follow up colonoscopy by DrPerry 5/12 showed severe diverticulosis, 2 sm polyps= tubular adenoma & f/u planned 5 yrs...  RENAL CALCULUS, HX OF (ICD-V13.01) BENIGN PROSTATIC HYPERTROPHY, HX OF (ICD-V13.8) - on FLOMAX 0.4mg /d w/ improved symptoms (he states flow better since stopping Vit E supplement). ~  4/12:  Routine PSA= 4.9 (it was 1.02 4/11) & we rx w/ Doxy Bid x14d, thewn plan recheck PSA==> 1.30  DEGENERATIVE JOINT DISEASE (ICD-715.90) - pt states "I have a bum knee" and eval by DrDuda w/ viscosupplementation shots... Note: Abd CT w/ bilat hip AVN noted... takes TYLENOL Prn... ~  11/10:  s/p left knee arthroscopy by DrDuda... ~  10/11:  s/p fall w/ left knee injury- s/p cortisone shot by DrDuda; hx shot in left shoulder too. ~  7/14: he is c/o incr pain in left hip area; known hx AVN & needs f/u by Ortho...  BACK PAIN, LUMBAR (ICD-724.2)  ANXIETY (ICD-300.00) - not currently on meds for nerves.  ANEMIA (ICD-285.9) - prev hx of GIB... ~  labs 4/09 showed Hg= 14.1 ~  labs 4/10 showed Hg= 13.6 ~  labs 4/11 showed Hg= 13.4 ~  Labs 4/12 showed Hg= 13.8 ~  Labs 3/13 showed  Hg= 13.1 ~  Labs 1/14 showed Hg= 13.6 ~  Labs 1/15 showed Hg= 13.7 ~  Labs 1/16 showed Hg= 14.1 ~  CBC remains WNL...  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  Health Maintenance: ~  GI: followed by DrPerry> Colonoscopy 5/07 by DrSam... f/u planned 5 yrs. ~  GU:  See PSAs recorded above... ~  Immunizations: Pneumovax in 2008 @ age 83;  [place] given 7/15; Tetanus- given 4/10;  he gets yearly seasonal Flu vaccine.   Past Surgical History:  Procedure Laterality Date  . CATARACT  EXTRACTION    . CHOLECYSTECTOMY    . KNEE SURGERY  11/10  . SKIN CANCER EXCISION  3/10   left hand     Outpatient Encounter Prescriptions as of 10/15/2016  Medication Sig Dispense Refill  . acetaminophen (TYLENOL) 500 MG tablet Take 500 mg by mouth every 6 (six) hours as needed.    [name] aspirin 81 MG tablet Take 81 mg by mouth daily.      [name] atenolol (TENORMIN) 25 MG tablet Take 1 tablet (25 mg total) by mouth daily. 90 tablet 3  . Cholecalciferol (VITAMIN D) 2000 UNITS CAPS Take 1 capsule by mouth daily.      . clotrimazole (MYCELEX) 10 MG troche Dissolve 1 in the mouth daily    . COMPRO 25 MG suppository PLACE 1 SUPPOSITORY (25 MG TOTAL) RECTALLY EVERY 12 (TWELVE) HOURS AS NEEDED FOR NAUSEA. 12 suppository 0  . dextromethorphan-guaiFENesin (MUCINEX DM) 30-600 MG per 12 hr tablet Take 1 tablet by mouth every 12 (twelve) hours.    . fluticasone (FLONASE) 50 MCG/ACT nasal spray Place 2 sprays into both nostrils daily. 16 g 11  . fluticasone-salmeterol (ADVAIR HFA) 115-21 MCG/ACT inhaler Inhale 1 puff into the lungs 2 (two) times daily. 1 Inhaler 11  . folic acid (FOLVITE) 1 MG tablet Take 1 mg by mouth.    [name] glucose blood (ONE TOUCH ULTRA TEST) test strip TEST BLOOD SUGAR ONCE DAILY AS DIRECTED 100 each 4  . hyoscyamine (LEVSIN SL) 0.125 MG SL tablet Place 1 tablet (0.125 mg total) under the tongue every 4 (four) hours as needed. As needed for abdominal cramping 30 tablet 5  . lidocaine (ASPERCREME W/LIDOCAINE) 4 % cream Apply 1 application topically as needed.    . loratadine (CLARITIN) 10 MG tablet Take 10 mg by mouth daily as needed for allergies.    [name] losartan (COZAAR) 100 MG tablet TAKE 1 TABLET (100 MG TOTAL) BY MOUTH DAILY. 90 tablet 3  . metFORMIN (GLUCOPHAGE) 500 MG tablet TAKE 1 TABLET TWICE A DAY WITH A MEAL 180 tablet 3  . methotrexate (RHEUMATREX) 2.5 MG tablet Take 3 tablets by mouth once weekly    . Multiple Vitamins-Minerals (MULTIVITAMIN WITH MINERALS) tablet Take 1 tablet by  mouth daily.      . niacin (NIASPAN) 500 MG CR tablet Take 1 tablet (500 mg total) by mouth at bedtime. 90 tablet 3  . nitroGLYCERIN (NITROSTAT) 0.4 MG SL tablet Place 1 tablet (0.4 mg total) under the tongue every 5 (five) minutes as needed for chest pain. 30 tablet 11  . pantoprazole (PROTONIX) 40 MG tablet Take 1 tablet (40 mg total) by mouth daily. 90 tablet 3  . polyethylene glycol powder (GLYCOLAX/MIRALAX) powder TAKE 17 GRAMS BY MOUTH TWICE DAILY AS DIRECTED 1054 g 5  . sodium chloride (OCEAN) 0.65 % nasal spray 1 spray by Nasal route as needed.      . tacrolimus (PROGRAF)  1 MG capsule Dissolve 1 capsule in 500 mL of water and swish as directed    . tamsulosin (FLOMAX) 0.4 MG CAPS capsule Take 1 capsule (0.4 mg total) by mouth 2 (two) times daily. 180 capsule 3  . traMADol (ULTRAM) 50 MG tablet TAKE 1 TABLET THREE TIMES A DAY AS NEEDED FOR PAIN 90 tablet 5  . [DISCONTINUED] hyoscyamine (LEVSIN SL) 0.125 MG SL tablet Place 1 tablet (0.125 mg total) under the tongue every 4 (four) hours as needed. As needed for abdominal cramping 90 tablet 5  . [DISCONTINUED] losartan (COZAAR) 100 MG tablet TAKE 1 TABLET (100 MG TOTAL) BY MOUTH DAILY. 90 tablet 0  . [DISCONTINUED] polyethylene glycol powder (GLYCOLAX/MIRALAX) powder TAKE 17 GRAMS BY MOUTH TWICE DAILY AS DIRECTED 1054 g 5  . famotidine (PEPCID) 40 MG tablet Take 1 tablet (40 mg total) by mouth daily. 90 tablet 3  . [DISCONTINUED] Desoximetasone 0.05 % GEL APPLY TO AFFECTED AREA TWICE A DAY  1  . [DISCONTINUED] prochlorperazine (COMPAZINE) 25 MG suppository Place 25 mg rectally every 12 (twelve) hours as needed.     No facility-administered encounter medications on file as of 10/15/2016.     Allergies  Allergen Reactions  . Prednisone     REACTION: high dose intolerance    Immunization History  Administered Date(s) Administered  . H1N1 08/21/2008  . Influenza Split 06/01/2011, 05/26/2012  . Influenza Whole 06/06/2008, 06/21/2009,  06/06/2010  . Influenza, High Dose Seasonal PF 07/03/2016  . Influenza,inj,Quad PF,36+ Mos 06/07/2013, 07/09/2014, 06/10/2015  . Pneumococcal Conjugate-13 04/05/2014  . Pneumococcal Polysaccharide-23 06/30/2006  . Zoster 09/14/2010    Current Medications, Allergies, Past Medical History, Past Surgical History, Family History, and Social History were reviewed in [employer] record.    Review of Systems         See HPI - all other systems neg except as noted... The patient complains of decreased hearing, dyspnea on exertion, headaches, and difficulty walking.  The patient denies anorexia, fever, weight loss, weight gain, vision loss, hoarseness, chest pain, syncope, peripheral edema, prolonged cough, hemoptysis, abdominal pain, melena, hematochezia, severe indigestion/heartburn, hematuria, incontinence, muscle weakness, suspicious skin lesions, transient blindness, depression, unusual weight change, abnormal bleeding, enlarged lymph nodes, and angioedema.     Objective:   Physical Exam      WD, WN, 81 y/o WM in NAD... GENERAL:  Alert & oriented; pleasant & cooperative... HEENT:  [place]/AT, EOM-full, PERRLA, EACs-clear, TMs-wnl, NOSE- pale, congested, THROAT-clear, but lichenoid lesion on lat tongue. NECK:  Supple w/ fairROM; no JVD; normal carotid impulses w/o bruits; no thyromegaly or nodules palpated; no lymphadenopathy. CHEST:  Clear to P & A w/o wheezing, rhonchi, rales, or signs of consolidation... HEART:  Regular Rhythm; without murmurs/ rubs/ or gallops detected...  ABDOMEN:  Soft & nontender; normal bowel sounds; no organomegaly or masses palpated., no guarding, or rebound EXT: without deformities, mild arthritic changes; no varicose veins/ venous insuffic/ or edema. NEURO:  CN's intact;  no focal neuro deficits... DERM:  No lesions noted; no rash etc...  RADIOLOGY DATA:  Reviewed in the EPIC EMR & discussed w/ the patient...  LABORATORY DATA:  Reviewed in  the EPIC EMR & discussed w/ the patient...   Assessment & Plan:    Lesion on tongue>. LICHEN PLANUS and he is improved on Rx from [place] (MTX, Mycelex, & Prograf=> dissolve in water/ swish/ & spit)...   Hx Acute Bronchitis> presented 10/15 w/ refractory bronchitic episode; given ZPak=>Levaquin, Depo80/ Medrol  dosepak, Mucinex, Hydromet, etc=> resolved... COPD exac>  He continues on Advair, Proair rescue, Mucinex; also Claritin, Flonase, Saline; stable continue same Rx...  HBP>  On LOSARTAN 100mg /d & [id number] w/ good control of BP, continue same...  R/O CAD>  Followed by DrHochrein, and doing satis w/ neg Myoview 6/15... continue ASA & risk factor reduction....  Lipids>  Looks satis on his diet + Niacin...  DM>  Control is fair w/ A1c stable at 6.7 he was told there are more meds in his future if he doesn't get his wt down (currently~225#)...  GI>  Per DrPerry w/ GERD, ?Gastroparesis, Divertics, IBS, Colon polyps> f/u colon 5/12 w/ divertics & 2 sm adenomas, repeat planned ~34yrs...  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  Anxiety>  Certainly an issue, he does not want anxiolytic Rx...   Patient's Medications  New Prescriptions   FAMOTIDINE (PEPCID) 40 MG TABLET    Take 1 tablet (40 mg total) by mouth daily.  Previous Medications   ACETAMINOPHEN (TYLENOL) 500 MG TABLET    Take 500 mg by mouth every 6 (six) hours as needed.   ASPIRIN 81 MG TABLET    Take 81 mg by mouth daily.     ATENOLOL (TENORMIN) 25 MG TABLET    Take 1 tablet (25 mg total) by mouth daily.   CHOLECALCIFEROL (VITAMIN D) 2000 UNITS CAPS    Take 1 capsule by mouth daily.     CLOTRIMAZOLE (MYCELEX) 10 MG TROCHE    Dissolve 1 in the mouth daily   COMPRO 25 MG SUPPOSITORY    PLACE 1 SUPPOSITORY (25 MG TOTAL) RECTALLY EVERY 12 (TWELVE) HOURS AS NEEDED FOR NAUSEA.   DEXTROMETHORPHAN-GUAIFENESIN (MUCINEX DM) 30-600 MG PER 12 HR TABLET    Take 1 tablet by mouth  every 12 (twelve) hours.   FLUTICASONE (FLONASE) 50 MCG/ACT NASAL SPRAY    Place 2 sprays into both nostrils daily.   FLUTICASONE-SALMETEROL (ADVAIR HFA) 115-21 MCG/ACT INHALER    Inhale 1 puff into the lungs 2 (two) times daily.   FOLIC ACID (FOLVITE) 1 MG TABLET    Take 1 mg by mouth.   GLUCOSE BLOOD (ONE TOUCH ULTRA TEST) TEST STRIP    TEST BLOOD SUGAR ONCE DAILY AS DIRECTED   LIDOCAINE (ASPERCREME W/LIDOCAINE) 4 % CREAM    Apply 1 application topically as needed.   LORATADINE (CLARITIN) 10 MG TABLET    Take 10 mg by mouth daily as needed for allergies.   METFORMIN (GLUCOPHAGE) 500 MG TABLET    TAKE 1 TABLET TWICE A DAY WITH A MEAL   METHOTREXATE (RHEUMATREX) 2.5 MG TABLET    Take 3 tablets by mouth once weekly   MULTIPLE VITAMINS-MINERALS (MULTIVITAMIN WITH MINERALS) TABLET    Take 1 tablet by mouth daily.     NIACIN (NIASPAN) 500 MG CR TABLET    Take 1 tablet (500 mg total) by mouth at bedtime.   NITROGLYCERIN (NITROSTAT) 0.4 MG SL TABLET    Place 1 tablet (0.4 mg total) under the tongue every 5 (five) minutes as needed for chest pain.   PANTOPRAZOLE (PROTONIX) 40 MG TABLET    Take 1 tablet (40 mg total) by mouth daily.   SODIUM CHLORIDE (OCEAN) 0.65 % NASAL SPRAY    1 spray by Nasal route as needed.     TACROLIMUS (PROGRAF) 1 MG CAPSULE    Dissolve 1 capsule in 500 mL of water and swish as directed  TAMSULOSIN (FLOMAX) 0.4 MG CAPS CAPSULE    Take 1 capsule (0.4 mg total) by mouth 2 (two) times daily.   TRAMADOL (ULTRAM) 50 MG TABLET    TAKE 1 TABLET THREE TIMES A DAY AS NEEDED FOR PAIN  Modified Medications   Modified Medication Previous Medication   HYOSCYAMINE (LEVSIN SL) 0.125 MG SL TABLET hyoscyamine (LEVSIN SL) 0.125 MG SL tablet      Place 1 tablet (0.125 mg total) under the tongue every 4 (four) hours as needed. As needed for abdominal cramping    Place 1 tablet (0.125 mg total) under the tongue every 4 (four) hours as needed. As needed for abdominal cramping   LOSARTAN (COZAAR)  100 MG TABLET losartan (COZAAR) 100 MG tablet      TAKE 1 TABLET (100 MG TOTAL) BY MOUTH DAILY.    TAKE 1 TABLET (100 MG TOTAL) BY MOUTH DAILY.   POLYETHYLENE GLYCOL POWDER (GLYCOLAX/MIRALAX) POWDER polyethylene glycol powder (GLYCOLAX/MIRALAX) powder      TAKE 17 GRAMS BY MOUTH TWICE DAILY AS DIRECTED    TAKE 17 GRAMS BY MOUTH TWICE DAILY AS DIRECTED  Discontinued Medications   DESOXIMETASONE 0.05 % GEL    APPLY TO AFFECTED AREA TWICE A DAY   PROCHLORPERAZINE (COMPAZINE) 25 MG SUPPOSITORY    Place 25 mg rectally every 12 (twelve) hours as needed.

## 2016-10-15 NOTE — Patient Instructions (Signed)
Today we updated your med list in our EPIC system...    Continue your current medications the same...  We refilled your meds per request...  Today we rechecked your fasting blood work...    We will contact you w/ the results when available...   We decided to change your PROTONIX to PEPCID 40mg  one tab daily...  Call for any questions...  Let's plan a follow up visit in 46mo, sooner if needed for problems.Marland KitchenMarland Kitchen

## 2016-11-12 ENCOUNTER — Telehealth: Payer: Self-pay | Admitting: Pulmonary Disease

## 2016-11-12 ENCOUNTER — Inpatient Hospital Stay (HOSPITAL_BASED_OUTPATIENT_CLINIC_OR_DEPARTMENT_OTHER)
Admission: EM | Admit: 2016-11-12 | Discharge: 2016-11-15 | DRG: 871 | Disposition: A | Discharge status: Home / Self Care | Payer: Medicare Other | Attending: Internal Medicine | Admitting: Internal Medicine

## 2016-11-12 ENCOUNTER — Emergency Department (HOSPITAL_BASED_OUTPATIENT_CLINIC_OR_DEPARTMENT_OTHER): Payer: Medicare Other

## 2016-11-12 ENCOUNTER — Encounter (HOSPITAL_BASED_OUTPATIENT_CLINIC_OR_DEPARTMENT_OTHER): Payer: Self-pay

## 2016-11-12 DIAGNOSIS — I2510 Atherosclerotic heart disease of native coronary artery without angina pectoris: Secondary | ICD-10-CM | POA: Diagnosis present

## 2016-11-12 DIAGNOSIS — R042 Hemoptysis: Secondary | ICD-10-CM | POA: Diagnosis present

## 2016-11-12 DIAGNOSIS — G4733 Obstructive sleep apnea (adult) (pediatric): Secondary | ICD-10-CM | POA: Diagnosis present

## 2016-11-12 DIAGNOSIS — E119 Type 2 diabetes mellitus without complications: Secondary | ICD-10-CM | POA: Diagnosis present

## 2016-11-12 DIAGNOSIS — I10 Essential (primary) hypertension: Secondary | ICD-10-CM | POA: Diagnosis present

## 2016-11-12 DIAGNOSIS — J441 Chronic obstructive pulmonary disease with (acute) exacerbation: Secondary | ICD-10-CM | POA: Diagnosis present

## 2016-11-12 DIAGNOSIS — A419 Sepsis, unspecified organism: Secondary | ICD-10-CM | POA: Diagnosis not present

## 2016-11-12 DIAGNOSIS — Z87891 Personal history of nicotine dependence: Secondary | ICD-10-CM | POA: Diagnosis not present

## 2016-11-12 DIAGNOSIS — Z0389 Encounter for observation for other suspected diseases and conditions ruled out: Secondary | ICD-10-CM | POA: Diagnosis not present

## 2016-11-12 DIAGNOSIS — Z8249 Family history of ischemic heart disease and other diseases of the circulatory system: Secondary | ICD-10-CM | POA: Diagnosis not present

## 2016-11-12 DIAGNOSIS — J181 Lobar pneumonia, unspecified organism: Secondary | ICD-10-CM | POA: Diagnosis not present

## 2016-11-12 DIAGNOSIS — Z823 Family history of stroke: Secondary | ICD-10-CM

## 2016-11-12 DIAGNOSIS — J4489 Other specified chronic obstructive pulmonary disease: Secondary | ICD-10-CM | POA: Diagnosis present

## 2016-11-12 DIAGNOSIS — K219 Gastro-esophageal reflux disease without esophagitis: Secondary | ICD-10-CM | POA: Diagnosis present

## 2016-11-12 DIAGNOSIS — R0602 Shortness of breath: Secondary | ICD-10-CM

## 2016-11-12 DIAGNOSIS — Z7982 Long term (current) use of aspirin: Secondary | ICD-10-CM | POA: Diagnosis not present

## 2016-11-12 DIAGNOSIS — E782 Mixed hyperlipidemia: Secondary | ICD-10-CM | POA: Diagnosis present

## 2016-11-12 DIAGNOSIS — J189 Pneumonia, unspecified organism: Secondary | ICD-10-CM | POA: Diagnosis present

## 2016-11-12 DIAGNOSIS — Z87442 Personal history of urinary calculi: Secondary | ICD-10-CM

## 2016-11-12 DIAGNOSIS — N400 Enlarged prostate without lower urinary tract symptoms: Secondary | ICD-10-CM | POA: Diagnosis present

## 2016-11-12 DIAGNOSIS — J440 Chronic obstructive pulmonary disease with acute lower respiratory infection: Secondary | ICD-10-CM | POA: Diagnosis present

## 2016-11-12 DIAGNOSIS — J449 Chronic obstructive pulmonary disease, unspecified: Secondary | ICD-10-CM

## 2016-11-12 DIAGNOSIS — Z833 Family history of diabetes mellitus: Secondary | ICD-10-CM

## 2016-11-12 DIAGNOSIS — R05 Cough: Secondary | ICD-10-CM | POA: Diagnosis not present

## 2016-11-12 DIAGNOSIS — Z7984 Long term (current) use of oral hypoglycemic drugs: Secondary | ICD-10-CM

## 2016-11-12 DIAGNOSIS — J9601 Acute respiratory failure with hypoxia: Secondary | ICD-10-CM

## 2016-11-12 DIAGNOSIS — Z79899 Other long term (current) drug therapy: Secondary | ICD-10-CM

## 2016-11-12 DIAGNOSIS — R079 Chest pain, unspecified: Secondary | ICD-10-CM

## 2016-11-12 HISTORY — DX: Other lichen planus: L43.8

## 2016-11-12 LAB — CBC WITH DIFFERENTIAL/PLATELET
BASOS ABS: 0 10*3/uL (ref 0.0–0.1)
Basophils Relative: 0 %
Eosinophils Absolute: 0.1 10*3/uL (ref 0.0–0.7)
Eosinophils Relative: 1 %
HEMATOCRIT: 38 % — AB (ref 39.0–52.0)
HEMOGLOBIN: 12.7 g/dL — AB (ref 13.0–17.0)
Lymphocytes Relative: 7 %
Lymphs Abs: 0.6 10*3/uL — ABNORMAL LOW (ref 0.7–4.0)
MCH: 31.4 pg (ref 26.0–34.0)
MCHC: 33.4 g/dL (ref 30.0–36.0)
MCV: 94.1 fL (ref 78.0–100.0)
MONO ABS: 0.3 10*3/uL (ref 0.1–1.0)
MONOS PCT: 3 %
NEUTROS ABS: 8.6 10*3/uL — AB (ref 1.7–7.7)
NEUTROS PCT: 89 %
Platelets: 152 10*3/uL (ref 150–400)
RBC: 4.04 MIL/uL — ABNORMAL LOW (ref 4.22–5.81)
RDW: 13.7 % (ref 11.5–15.5)
WBC: 9.6 10*3/uL (ref 4.0–10.5)

## 2016-11-12 LAB — BASIC METABOLIC PANEL
ANION GAP: 6 (ref 5–15)
BUN: 18 mg/dL (ref 6–20)
CALCIUM: 9.2 mg/dL (ref 8.9–10.3)
CHLORIDE: 107 mmol/L (ref 101–111)
CO2: 25 mmol/L (ref 22–32)
Creatinine, Ser: 1 mg/dL (ref 0.61–1.24)
GFR calc Af Amer: >60 mL/min (ref >60)
GFR calc non Af Amer: >60 mL/min (ref >60)
GLUCOSE: 183 mg/dL — AB (ref 65–99)
Potassium: 4.7 mmol/L (ref 3.5–5.1)
Sodium: 138 mmol/L (ref 135–145)

## 2016-11-12 LAB — I-STAT CG4 LACTIC ACID, ED
LACTIC ACID, VENOUS: 1.19 mmol/L (ref 0.5–1.9)
Lactic Acid, Venous: 2.01 mmol/L (ref 0.5–1.9)

## 2016-11-12 LAB — URINALYSIS, ROUTINE W REFLEX MICROSCOPIC
Bilirubin Urine: NEGATIVE
Glucose, UA: NEGATIVE mg/dL
HGB URINE DIPSTICK: NEGATIVE
Ketones, ur: NEGATIVE mg/dL
Leukocytes, UA: NEGATIVE
Nitrite: NEGATIVE
PROTEIN: NEGATIVE mg/dL
SPECIFIC GRAVITY, URINE: 1.02 (ref 1.005–1.030)
pH: 5 (ref 5.0–8.0)

## 2016-11-12 LAB — GLUCOSE, CAPILLARY: GLUCOSE-CAPILLARY: 128 mg/dL — AB (ref 65–99)

## 2016-11-12 LAB — INFLUENZA PANEL BY PCR (TYPE A & B)
Influenza A By PCR: NEGATIVE
Influenza B By PCR: NEGATIVE

## 2016-11-12 MED ORDER — ONDANSETRON HCL 4 MG/2ML IJ SOLN
4.0000 mg | Freq: Once | INTRAMUSCULAR | Status: DC
  Filled 2016-11-12: qty 2

## 2016-11-12 MED ORDER — METHOTREXATE 2.5 MG PO TABS: 7.5000 mg | ORAL_TABLET | ORAL | Status: DC

## 2016-11-12 MED ORDER — DEXTROSE 5 % IV SOLN
1.0000 g | INTRAVENOUS | Status: DC
  Administered 2016-11-13 – 2016-11-15 (×3): 1 g via INTRAVENOUS
  Filled 2016-11-12 (×3): qty 10

## 2016-11-12 MED ORDER — TAMSULOSIN HCL 0.4 MG PO CAPS
0.4000 mg | ORAL_CAPSULE | Freq: Two times a day (BID) | ORAL | Status: DC
  Administered 2016-11-12 – 2016-11-15 (×6): 0.4 mg via ORAL
  Filled 2016-11-12 (×6): qty 1

## 2016-11-12 MED ORDER — ATENOLOL 50 MG PO TABS: 25.0000 mg | ORAL_TABLET | Freq: Every day | ORAL | Status: DC

## 2016-11-12 MED ORDER — ASPIRIN EC 81 MG PO TBEC
81.0000 mg | DELAYED_RELEASE_TABLET | Freq: Every day | ORAL | Status: DC
  Administered 2016-11-12 – 2016-11-14 (×3): 81 mg via ORAL
  Filled 2016-11-12 (×3): qty 1

## 2016-11-12 MED ORDER — ACETAMINOPHEN 325 MG PO TABS
650.0000 mg | ORAL_TABLET | Freq: Once | ORAL | Status: AC
  Administered 2016-11-12: 650 mg via ORAL

## 2016-11-12 MED ORDER — ENOXAPARIN SODIUM 40 MG/0.4ML ~~LOC~~ SOLN
40.0000 mg | SUBCUTANEOUS | Status: DC
  Administered 2016-11-12 – 2016-11-14 (×3): 40 mg via SUBCUTANEOUS
  Filled 2016-11-12 (×3): qty 0.4

## 2016-11-12 MED ORDER — MOMETASONE FURO-FORMOTEROL FUM 200-5 MCG/ACT IN AERO
2.0000 | INHALATION_SPRAY | Freq: Two times a day (BID) | RESPIRATORY_TRACT | Status: DC
  Administered 2016-11-13 – 2016-11-15 (×5): 2 via RESPIRATORY_TRACT
  Filled 2016-11-12: qty 8.8

## 2016-11-12 MED ORDER — ONDANSETRON HCL 4 MG/2ML IJ SOLN: 4.0000 mg | Freq: Four times a day (QID) | INTRAMUSCULAR | Status: DC | PRN

## 2016-11-12 MED ORDER — INSULIN ASPART 100 UNIT/ML ~~LOC~~ SOLN
0.0000 [IU] | Freq: Three times a day (TID) | SUBCUTANEOUS | Status: DC
  Administered 2016-11-13 – 2016-11-14 (×3): 1 [IU] via SUBCUTANEOUS
  Administered 2016-11-14: 2 [IU] via SUBCUTANEOUS
  Administered 2016-11-15: 1 [IU] via SUBCUTANEOUS

## 2016-11-12 MED ORDER — CLOTRIMAZOLE 10 MG MT TROC
10.0000 mg | Freq: Every day | OROMUCOSAL | Status: DC
  Administered 2016-11-12 – 2016-11-15 (×4): 10 mg via ORAL
  Filled 2016-11-12 (×4): qty 1

## 2016-11-12 MED ORDER — ACETAMINOPHEN 325 MG PO TABS
ORAL_TABLET | ORAL | Status: AC
Start: 2016-11-12 — End: 2016-11-12
  Administered 2016-11-12: 650 mg via ORAL
  Filled 2016-11-12: qty 2

## 2016-11-12 MED ORDER — ACETAMINOPHEN 325 MG PO TABS
ORAL_TABLET | ORAL | Status: AC
  Filled 2016-11-12: qty 2

## 2016-11-12 MED ORDER — HYOSCYAMINE SULFATE 0.125 MG SL SUBL
0.1250 mg | SUBLINGUAL_TABLET | SUBLINGUAL | Status: DC | PRN
  Filled 2016-11-12: qty 1

## 2016-11-12 MED ORDER — INSULIN ASPART 100 UNIT/ML ~~LOC~~ SOLN: 0.0000 [IU] | Freq: Every day | SUBCUTANEOUS | Status: DC

## 2016-11-12 MED ORDER — OSELTAMIVIR PHOSPHATE 75 MG PO CAPS
75.0000 mg | ORAL_CAPSULE | Freq: Once | ORAL | Status: AC
  Administered 2016-11-12: 75 mg via ORAL
  Filled 2016-11-12: qty 1

## 2016-11-12 MED ORDER — RISAQUAD PO CAPS
1.0000 | ORAL_CAPSULE | Freq: Every day | ORAL | Status: DC
  Administered 2016-11-12 – 2016-11-15 (×4): 1 via ORAL
  Filled 2016-11-12 (×4): qty 1

## 2016-11-12 MED ORDER — FOLIC ACID 1 MG PO TABS
1.0000 mg | ORAL_TABLET | Freq: Every day | ORAL | Status: DC
  Administered 2016-11-13 – 2016-11-15 (×3): 1 mg via ORAL
  Filled 2016-11-12 (×3): qty 1

## 2016-11-12 MED ORDER — SODIUM CHLORIDE 0.9 % IV SOLN
INTRAVENOUS | Status: DC
  Administered 2016-11-12 – 2016-11-14 (×5): via INTRAVENOUS

## 2016-11-12 MED ORDER — ONDANSETRON HCL 4 MG PO TABS: 4.0000 mg | ORAL_TABLET | Freq: Four times a day (QID) | ORAL | Status: DC | PRN

## 2016-11-12 MED ORDER — AZITHROMYCIN 500 MG PO TABS
500.0000 mg | ORAL_TABLET | ORAL | Status: DC
  Administered 2016-11-13 – 2016-11-15 (×3): 500 mg via ORAL
  Filled 2016-11-12 (×3): qty 1

## 2016-11-12 MED ORDER — POLYETHYLENE GLYCOL 3350 17 G PO PACK
17.0000 g | PACK | Freq: Two times a day (BID) | ORAL | Status: DC
  Administered 2016-11-12 – 2016-11-15 (×6): 17 g via ORAL
  Filled 2016-11-12 (×6): qty 1

## 2016-11-12 MED ORDER — ACETAMINOPHEN 650 MG RE SUPP: 650.0000 mg | Freq: Four times a day (QID) | RECTAL | Status: DC | PRN

## 2016-11-12 MED ORDER — FAMOTIDINE 20 MG PO TABS
40.0000 mg | ORAL_TABLET | Freq: Every day | ORAL | Status: DC
  Administered 2016-11-12 – 2016-11-14 (×3): 40 mg via ORAL
  Filled 2016-11-12 (×3): qty 2

## 2016-11-12 MED ORDER — SODIUM CHLORIDE 0.9 % IV BOLUS (SEPSIS)
1000.0000 mL | Freq: Once | INTRAVENOUS | Status: AC
  Administered 2016-11-12: 1000 mL via INTRAVENOUS

## 2016-11-12 MED ORDER — ONDANSETRON HCL 4 MG/2ML IJ SOLN
4.0000 mg | Freq: Once | INTRAMUSCULAR | Status: AC
  Administered 2016-11-12: 4 mg via INTRAVENOUS

## 2016-11-12 MED ORDER — DEXTROSE 5 % IV SOLN
1.0000 g | Freq: Once | INTRAVENOUS | Status: AC
  Administered 2016-11-12: 1 g via INTRAVENOUS
  Filled 2016-11-12: qty 10

## 2016-11-12 MED ORDER — ALBUTEROL SULFATE (2.5 MG/3ML) 0.083% IN NEBU: 2.5000 mg | INHALATION_SOLUTION | RESPIRATORY_TRACT | Status: DC | PRN

## 2016-11-12 MED ORDER — ACETAMINOPHEN 325 MG PO TABS
650.0000 mg | ORAL_TABLET | Freq: Four times a day (QID) | ORAL | Status: DC | PRN
  Administered 2016-11-13: 650 mg via ORAL
  Filled 2016-11-12: qty 2

## 2016-11-12 MED ORDER — AZITHROMYCIN 500 MG IV SOLR
500.0000 mg | Freq: Once | INTRAVENOUS | Status: AC
  Administered 2016-11-12: 500 mg via INTRAVENOUS

## 2016-11-12 MED ORDER — FENTANYL CITRATE (PF) 100 MCG/2ML IJ SOLN
50.0000 ug | Freq: Once | INTRAMUSCULAR | Status: AC
Start: 2016-11-12 — End: 2016-11-12
  Administered 2016-11-12: 50 ug via INTRAVENOUS
  Filled 2016-11-12: qty 2

## 2016-11-12 MED ORDER — AZITHROMYCIN 500 MG IV SOLR
INTRAVENOUS | Status: AC
  Filled 2016-11-12: qty 500

## 2016-11-12 NOTE — ED Notes (Signed)
Crackers and diet ginger ale given to pt.

## 2016-11-12 NOTE — ED Notes (Signed)
Placed on 2L [place]  

## 2016-11-12 NOTE — Telephone Encounter (Signed)
   Patient calls to let us know that for 2 nights in a row, he has woken up freezing. Temperature was 97-98 Fahrenheit. Has chronic cough, not significantly changed. No sick contacts. The rest of the day yesterday, he was his usual self. He woke up at 3 AM this morning, freezing. He is concerned about an infection and he asked me whether he is okay to go to the emergency room. I said go ahead and go to the emergency room and have himself checked out. If need be, patient can be transferred to [facility] or [facility] He plans to go to the med Center since its nearer to him.  Dr. Lenna Gilford >> this is just an FYI.    Monica Becton, MD 11/12/2016, 4:40 AM [facility] Pager [phone] After 3 pm or if no answer, call [phone]

## 2016-11-12 NOTE — ED Notes (Signed)
ED Provider at bedside. 

## 2016-11-12 NOTE — Progress Notes (Signed)
Jesse Santos is a 81 y.o. male patient admitted from ED awake, alert - oriented  X 4 - no acute distress noted.  VSS - Blood pressure 121/63, pulse 93, temperature 98.3 F (36.8 C), temperature source Oral, resp. rate 23, height 6' 1.75" (1.873 m), weight 96.2 kg (212 lb), SpO2 94 %.    IV in place, occlusive dsg intact without redness.  Orientation to room, and floor completed with information packet given to patient/family.  Patient declined safety video at this time.  Admission INP armband ID verified with patient/family, and in place.   SR up x 2, fall assessment complete, with patient and family able to verbalize understanding of risk associated with falls, and verbalized understanding to call nsg before up out of bed.  Call light within reach, patient able to voice, and demonstrate understanding.  Skin, clean-dry- intact without evidence of bruising, or skin tears.   No evidence of skin break down noted on exam.     Will cont to eval and treat per MD orders.  Betha Loa Jeffifer Rabold, RN 11/12/2016 6:44 PM

## 2016-11-12 NOTE — ED Provider Notes (Signed)
[facility] DE[facility] Provider Note: Jesse Spurling, MD, FACEP  CSN: [id number] MRN: [id number] ARRIVAL: 11/12/16 at [place]: Jesse  Jesse Santos is a 81 y.o. male COPD and lichen planus of the tongue for which he takes methotrexate. He is here with shaking chills that awakened him about 3 AM. He took one Tylenol tablet without relief. He also complains of general malaise and hurting all over including soreness in his chest, worse when he coughs. He started coughing this morning and has had nausea but no vomiting or diarrhea. He wasn't aware of having a fever but his temperature was noted to be 101.3 on arrival. He was given 650 milligrams of Tylenol for this. He states he hasn't felt this bad since having double pneumonia in 1962.   Past Medical History:  Diagnosis Date  . Allergic rhinitis, cause unspecified   . Anemia   . Anxiety   . Benign neoplasm of colon   . BPH (benign prostatic hypertrophy)   . COPD (chronic obstructive pulmonary disease) ([place])   . Coronary artery disease    Coronary calcification with negative ETT 2010;   ETT-Myoview (02/2014):  Normal stress nuclear study. No evidence of ischemia.  LV Ejection Fraction: 69%  . Degenerative joint disease   . Diverticulosis of colon (without mention of hemorrhage)   . Esophageal reflux   . Hearing loss   . Hyperplastic colon polyp 02/12/2011  . Irritable bowel syndrome   . Lumbar back pain   . Mixed hyperlipidemia   . Obstructive sleep apnea (adult) (pediatric)   . Oral lichen planus   . Renal calculus   . Type II or unspecified type diabetes mellitus without mention of complication, not stated as uncontrolled   . Unspecified essential hypertension     Past Surgical History:  Procedure Laterality Date  . CATARACT EXTRACTION    . CHOLECYSTECTOMY    . KNEE SURGERY  11/10  . SKIN CANCER EXCISION  3/10   left hand     Family History  Problem  Relation Age of Onset  . Heart disease Father 76  . Hypertension Brother   . Stroke Mother 45  . Diabetes Mother   . Colon cancer Neg Hx     Social History  Substance Use Topics  . Smoking status: Former Smoker    Packs/day: 1.00    Years: 25.00    Types: Cigarettes    Quit date: 09/15/1979  . Smokeless tobacco: Former Systems developer    Quit date: 09/15/1979  . Alcohol use No    Prior to Admission medications   Medication Sig Start Date End Date Taking? Authorizing Provider  acetaminophen (TYLENOL) 500 MG tablet Take 500 mg by mouth every 6 (six) hours as needed.    Historical Provider, MD  aspirin 81 MG tablet Take 81 mg by mouth daily.      Historical Provider, MD  atenolol (TENORMIN) 25 MG tablet Take 1 tablet (25 mg total) by mouth daily. 04/13/16   Noralee Space, MD  Cholecalciferol (VITAMIN D) 2000 UNITS CAPS Take 1 capsule by mouth daily.      Historical Provider, MD  clotrimazole (MYCELEX) 10 MG troche Dissolve 1 in the mouth daily 04/01/16   Historical Provider, MD  COMPRO 25 MG suppository PLACE 1 SUPPOSITORY (25 MG TOTAL) RECTALLY EVERY 12 (TWELVE) HOURS AS NEEDED FOR NAUSEA. 03/07/12   Irene Shipper, MD  dextromethorphan-guaiFENesin [facility] DM)  30-600 MG per 12 hr tablet Take 1 tablet by mouth every 12 (twelve) hours.    Historical Provider, MD  famotidine (PEPCID) 40 MG tablet Take 1 tablet (40 mg total) by mouth daily. 10/15/16   Noralee Space, MD  fluticasone (FLONASE) 50 MCG/ACT nasal spray Place 2 sprays into both nostrils daily. 04/13/16   Noralee Space, MD  fluticasone-salmeterol (ADVAIR HFA) [id number] MCG/ACT inhaler Inhale 1 puff into the lungs 2 (two) times daily. 04/13/16   Noralee Space, MD  folic acid (FOLVITE) 1 MG tablet Take 1 mg by mouth. 07/21/16   Historical Provider, MD  glucose blood (ONE TOUCH ULTRA TEST) test strip TEST BLOOD SUGAR ONCE DAILY AS DIRECTED 01/27/16   Noralee Space, MD  hyoscyamine (LEVSIN SL) 0.125 MG SL tablet Place 1 tablet (0.125 mg total) under the tongue  every 4 (four) hours as needed. As needed for abdominal cramping 10/15/16   Noralee Space, MD  lidocaine (ASPERCREME W/LIDOCAINE) 4 % cream Apply 1 application topically as needed.    Historical Provider, MD  loratadine (CLARITIN) 10 MG tablet Take 10 mg by mouth daily as needed for allergies.    Historical Provider, MD  losartan (COZAAR) 100 MG tablet TAKE 1 TABLET (100 MG TOTAL) BY MOUTH DAILY. 10/15/16   Noralee Space, MD  metFORMIN (GLUCOPHAGE) 500 MG tablet TAKE 1 TABLET TWICE A DAY WITH A MEAL 04/13/16   Noralee Space, MD  methotrexate (RHEUMATREX) 2.5 MG tablet Take 3 tablets by mouth once weekly 07/21/16   Historical Provider, MD  Multiple Vitamins-Minerals (MULTIVITAMIN WITH MINERALS) tablet Take 1 tablet by mouth daily.      Historical Provider, MD  niacin (NIASPAN) 500 MG CR tablet Take 1 tablet (500 mg total) by mouth at bedtime. 04/13/16   Noralee Space, MD  nitroGLYCERIN (NITROSTAT) 0.4 MG SL tablet Place 1 tablet (0.4 mg total) under the tongue every 5 (five) minutes as needed for chest pain. 01/08/14   Minus Breeding, MD  pantoprazole (PROTONIX) 40 MG tablet Take 1 tablet (40 mg total) by mouth daily. 04/13/16   Noralee Space, MD  polyethylene glycol powder (GLYCOLAX/MIRALAX) powder TAKE 17 GRAMS BY MOUTH TWICE DAILY AS DIRECTED 10/15/16   Noralee Space, MD  sodium chloride (OCEAN) 0.65 % nasal spray 1 spray by Nasal route as needed.      Historical Provider, MD  tacrolimus (PROGRAF) 1 MG capsule Dissolve 1 capsule in 500 mL of water and swish as directed 04/01/16   Historical Provider, MD  tamsulosin (FLOMAX) 0.4 MG CAPS capsule Take 1 capsule (0.4 mg total) by mouth 2 (two) times daily. 04/13/16   Noralee Space, MD  traMADol (ULTRAM) 50 MG tablet TAKE 1 TABLET THREE TIMES A DAY AS NEEDED FOR PAIN 04/13/16   Noralee Space, MD    Allergies Prednisone   REVIEW OF SYSTEMS  Negative except as noted here or in the History of Present Illness.   PHYSICAL EXAMINATION  Initial Vital Signs Blood  pressure 138/97, pulse 101, temperature 101.3 F (38.5 C), temperature source Oral, resp. rate 18, height 6' 1.75" (1.873 m), weight 212 lb (96.2 kg), SpO2 92 %.  Examination General: Well-developed, well-nourished male in no acute distress; appearance consistent with age of record HENT: normocephalic; atraumatic; hard of hearing Eyes: pupils equal, round and reactive to light; extraocular muscles intact Neck: supple Heart: regular rate and rhythm Lungs: Decreased sounds right apex Abdomen: soft; nondistended; nontender; no masses or hepatosplenomegaly;  bowel sounds present Extremities: No deformity; full range of motion; pulses normal Neurologic: Awake, alert and oriented; motor function intact in all extremities and symmetric; no facial droop Skin: Warm and dry Psychiatric: Normal mood and affect   RESULTS  Summary of this visit's results, reviewed by myself:   EKG Interpretation  Date/Time:  Thursday November 12 2016 05:26:47 EST Ventricular Rate:  96 PR Interval:    QRS Duration: 87 QT Interval:  309 QTC Calculation: 391 R Axis:   69 Text Interpretation:  Normal sinus rhythm Normal ECG No significant change was found Confirmed by Pecolia Marando  MD, Jenny Reichmann ([id number]) on 11/12/2016 5:30:18 AM      Laboratory Studies: Results for orders placed or performed during the hospital encounter of 11/12/16 (from the past 24 hour(s))  CBC with Differential/Platelet     Status: Abnormal   Collection Time: 11/12/16  5:40 AM  Result Value Ref Range   WBC 9.6 4.0 - 10.5 K/uL   RBC 4.04 (L) 4.22 - 5.81 MIL/uL   Hemoglobin 12.7 (L) 13.0 - 17.0 g/dL   HCT 38.0 (L) 39.0 - 52.0 %   MCV 94.1 78.0 - 100.0 fL   MCH 31.4 26.0 - 34.0 pg   MCHC 33.4 30.0 - 36.0 g/dL   RDW 13.7 11.5 - 15.5 %   Platelets 152 150 - 400 K/uL   Neutrophils Relative % 89 %   Neutro Abs 8.6 (H) 1.7 - 7.7 K/uL   Lymphocytes Relative 7 %   Lymphs Abs 0.6 (L) 0.7 - 4.0 K/uL   Monocytes Relative 3 %   Monocytes Absolute 0.3 0.1 -  1.0 K/uL   Eosinophils Relative 1 %   Eosinophils Absolute 0.1 0.0 - 0.7 K/uL   Basophils Relative 0 %   Basophils Absolute 0.0 0.0 - 0.1 K/uL  Basic metabolic panel     Status: Abnormal   Collection Time: 11/12/16  5:40 AM  Result Value Ref Range   Sodium 138 135 - 145 mmol/L   Potassium 4.7 3.5 - 5.1 mmol/L   Chloride 107 101 - 111 mmol/L   CO2 25 22 - 32 mmol/L   Glucose, Bld 183 (H) 65 - 99 mg/dL   BUN 18 6 - 20 mg/dL   Creatinine, Ser 1.00 0.61 - 1.24 mg/dL   Calcium 9.2 8.9 - 10.3 mg/dL   GFR calc non Af Amer >60 >60 mL/min   GFR calc Af Amer >60 >60 mL/min   Anion gap 6 5 - 15  I-Stat CG4 Lactic Acid, ED     Status: Abnormal   Collection Time: 11/12/16  5:48 AM  Result Value Ref Range   Lactic Acid, Venous 2.01 (HH) 0.5 - 1.9 mmol/L   Comment NOTIFIED PHYSICIAN   Urinalysis, Routine w reflex microscopic     Status: None   Collection Time: 11/12/16  6:31 AM  Result Value Ref Range   Color, Urine YELLOW YELLOW   APPearance CLEAR CLEAR   Specific Gravity, Urine 1.020 1.005 - 1.030   pH 5.0 5.0 - 8.0   Glucose, UA NEGATIVE NEGATIVE mg/dL   Hgb urine dipstick NEGATIVE NEGATIVE   Bilirubin Urine NEGATIVE NEGATIVE   Ketones, ur NEGATIVE NEGATIVE mg/dL   Protein, ur NEGATIVE NEGATIVE mg/dL   Nitrite NEGATIVE NEGATIVE   Leukocytes, UA NEGATIVE NEGATIVE   Imaging Studies: Dg Chest 2 View  Result Date: 11/12/2016 CLINICAL DATA:  Epigastric pain, cough and shortness of breath for 2 hours. Fever and chills. EXAM: CHEST  2 VIEW  COMPARISON:  Chest radiograph August 10, 2016 FINDINGS: The cardiac silhouette is mildly enlarged. Calcified aortic knob. LEFT lower lung zone consolidation. No pleural effusion. No pneumothorax. Soft tissue planes included osseous structures are nonsuspicious. Osteopenia. Surgical clips project in anterior abdomen. IMPRESSION: LEFT lower lung zone consolidation/pneumonia. Followup PA and lateral chest X-ray is recommended in 3-4 weeks following trial of  antibiotic therapy to ensure resolution and exclude underlying malignancy. Mild cardiomegaly. Electronically Signed   By: Elon Alas M.D.   On: 11/12/2016 06:25    ED COURSE  Nursing notes and initial vitals signs, including pulse oximetry, reviewed.  Vitals:   11/12/16 0523 11/12/16 0530 11/12/16 0616 11/12/16 0654  BP: 138/97 149/59 141/66 133/69  Pulse: 101 93 102 101  Resp: 18 18 24 22   Temp: 101.3 F (38.5 C)   (!) 103.2 F (39.6 C)  TempSrc: Oral   Oral  SpO2: 92% 91% 90% 93%  Weight: 212 lb (96.2 kg)     Height: 6' 1.75" (1.873 m)      6:43 AM Rocephin and Zithromax ordered for left-sided pneumonia. Tamiflu also given for possible influenza.  PROCEDURES    ED DIAGNOSES     ICD-9-CM ICD-10-CM   1. Community acquired pneumonia of left lower lobe of lung [facility]) [place] J18.1        Shanon Rosser, MD 11/12/16 [phone]

## 2016-11-12 NOTE — ED Notes (Signed)
Report given to care link. ETA 15-20 mins.

## 2016-11-12 NOTE — ED Triage Notes (Signed)
Pt c/o chills, chest pain, weakness and nausea x2 days after taken 3tablets of his Methotrexate. States has been on this med x48months with s/s of weakness only.

## 2016-11-12 NOTE — Plan of Care (Signed)
81 year old male with past oral history significant for COPD, long-term use of methotrexate, diabetes, anxiety and anemia presents to [facility] with chief complaint of chills and chest pain. Chest x-ray revealed pneumonia. EDP started patient on azithromycin and Rocephin. Patient is maintaining her O2 saturation room air. Initial lactic acid was 2.01.  Status: Observation telemetry  Elwin Mocha MD

## 2016-11-12 NOTE — H&P (Signed)
History and Physical  Patient Name: Jesse Santos     [id number]    DOB: 06/21/1936    DOA: 11/12/2016 PCP: Noralee Space, MD   Patient coming from: Home --> [facility]  Chief Complaint: Cough, rigors  HPI: Jesse Santos is a 81 y.o. male with a past medical history significant for COPD mild, NIDDM, HTN, and CAD without PCI who presents with cough and rigors for 1 day.  The patient was in his usual state of health until this morning around 1AM he woke with cough, rigors, malaise.  He went and laid in the other room, but around 4 AM he woke his wife say he had to go to the ER because of severe rigors, malaise, productive cough, pleuritic pain, and myalgias.  ED course: -Temp 103.18F, heart rate 101, respirations 18-30, blood pressure 138/97, pulse oximetry 94% on 2L [place], but no recorded hypoxia -Na 138, K 4.7, Cr 1.00, WBC 9.6K, Hgb 12.7 -Lactic acid 2.01 --> repeat within six hours 1.19 -He had one recorded BP < 90 systolic but did NOT receive 30cc/kg IVF from EDP and it appears this was a spurious measurement because repeat BP within 30 minutes was normal without intervention -CXR showed LLL opacity, not wheezing on exam, had rusty sputum in ER -Influenza negative -He was given ceftraixoen and azithromycin and TRH were asekd to accept in transfer      ROS: Review of Systems  Constitutional: Positive for chills, fever and malaise/fatigue.  Respiratory: Positive for cough, hemoptysis and sputum production.   Cardiovascular: Positive for chest pain.  All other systems reviewed and are negative.         Past Medical History:  Diagnosis Date  . Allergic rhinitis, cause unspecified   . Anemia   . Anxiety   . Benign neoplasm of colon   . BPH (benign prostatic hypertrophy)   . COPD (chronic obstructive pulmonary disease) ([place])   . Coronary artery disease    Coronary calcification with negative ETT 2010;   ETT-Myoview (02/2014):  Normal stress nuclear study. No evidence of  ischemia.  LV Ejection Fraction: 69%  . Degenerative joint disease   . Diverticulosis of colon (without mention of hemorrhage)   . Esophageal reflux   . Hearing loss   . Hyperplastic colon polyp 02/12/2011  . Irritable bowel syndrome   . Lumbar back pain   . Mixed hyperlipidemia   . Obstructive sleep apnea (adult) (pediatric)   . Oral lichen planus   . Renal calculus   . Type II or unspecified type diabetes mellitus without mention of complication, not stated as uncontrolled   . Unspecified essential hypertension     Past Surgical History:  Procedure Laterality Date  . CATARACT EXTRACTION    . CHOLECYSTECTOMY    . KNEE SURGERY  11/10  . SKIN CANCER EXCISION  3/10   left hand     Social History: Patient lives with his wife.  The patient walks unassisted.  He is independent with all ADLs. He exercises on a treadmill regularly. He does not smoke or use alcohol. He is from [facility] originally, worked for [employer] of his life.  Allergies  Allergen Reactions  . Prednisone Other (See Comments)    REACTION: high dose intolerance - causes numbness from waist down - low dose tapered course ok    Family history: family history includes Diabetes in his mother; Heart disease (age of onset: 35) in his father; Hypertension in his brother; Stroke (age of  onset: 62) in his mother.  Prior to Admission medications   Medication Sig Start Date End Date Taking? Authorizing Provider  acetaminophen (TYLENOL) 500 MG tablet Take 500 mg by mouth every 6 (six) hours as needed for headache (pain).    Yes Historical Provider, MD  aspirin EC 81 MG tablet Take 81 mg by mouth at bedtime.   Yes Historical Provider, MD  atenolol (TENORMIN) 25 MG tablet Take 1 tablet (25 mg total) by mouth daily. Patient taking differently: Take 25 mg by mouth daily with supper.  04/13/16  Yes Noralee Space, MD  Cholecalciferol (VITAMIN D) 2000 UNITS CAPS Take 2,000 Units by mouth daily.    Yes Historical Provider, MD   clotrimazole (MYCELEX) 10 MG troche Take 10 mg by mouth daily.  04/01/16  Yes Historical Provider, MD  COMPRO 25 MG suppository PLACE 1 SUPPOSITORY (25 MG TOTAL) RECTALLY EVERY 12 (TWELVE) HOURS AS NEEDED FOR NAUSEA. 03/07/12  Yes Irene Shipper, MD  dextromethorphan-guaiFENesin [facility] DM) 30-600 MG per 12 hr tablet Take 1 tablet by mouth every 12 (twelve) hours.   Yes Historical Provider, MD  famotidine (PEPCID) 40 MG tablet Take 1 tablet (40 mg total) by mouth daily. Patient taking differently: Take 40 mg by mouth at bedtime.  10/15/16  Yes Noralee Space, MD  fluticasone (FLONASE) 50 MCG/ACT nasal spray Place 2 sprays into both nostrils daily. Patient taking differently: Place 2 sprays into both nostrils at bedtime.  04/13/16  Yes Noralee Space, MD  fluticasone-salmeterol (ADVAIR HFA) [phone] MCG/ACT inhaler Inhale 1 puff into the lungs 2 (two) times daily. 04/13/16  Yes Noralee Space, MD  folic acid (FOLVITE) 1 MG tablet Take 1 mg by mouth daily with lunch.  07/21/16  Yes Historical Provider, MD  hyoscyamine (LEVSIN SL) 0.125 MG SL tablet Place 1 tablet (0.125 mg total) under the tongue every 4 (four) hours as needed. As needed for abdominal cramping Patient taking differently: Place 0.125 mg under the tongue every 4 (four) hours as needed (abdominal cramping).  10/15/16  Yes Noralee Space, MD  lidocaine (ASPERCREME W/LIDOCAINE) 4 % cream Apply 1 application topically daily as needed (pain).    Yes Historical Provider, MD  losartan (COZAAR) 100 MG tablet TAKE 1 TABLET (100 MG TOTAL) BY MOUTH DAILY. Patient taking differently: Take 100 mg by mouth daily with breakfast.  10/15/16  Yes Noralee Space, MD  metFORMIN (GLUCOPHAGE) 500 MG tablet TAKE 1 TABLET TWICE A DAY WITH A MEAL Patient taking differently: Take 500 mg by mouth 2 (two) times daily with a meal.  04/13/16  Yes Noralee Space, MD  methotrexate (RHEUMATREX) 2.5 MG tablet Take 7.5 mg by mouth See admin instructions. Take 3 tablets (7.5 mg) by mouth on  Tuesdays with lunch 07/21/16  Yes Historical Provider, MD  Multiple Vitamins-Minerals (MULTIVITAMIN WITH MINERALS) tablet Take 1 tablet by mouth daily.     Yes Historical Provider, MD  niacin (NIASPAN) 500 MG CR tablet Take 1 tablet (500 mg total) by mouth at bedtime. 04/13/16  Yes Noralee Space, MD  nitroGLYCERIN (NITROSTAT) 0.4 MG SL tablet Place 1 tablet (0.4 mg total) under the tongue every 5 (five) minutes as needed for chest pain. 01/08/14  Yes Minus Breeding, MD  polyethylene glycol powder (GLYCOLAX/MIRALAX) powder TAKE 17 GRAMS BY MOUTH TWICE DAILY AS DIRECTED Patient taking differently: Take 17 g by mouth 2 (two) times daily. Mix in 8 oz liquid and drink 10/15/16  Yes Noralee Space,  MD  Probiotic Product (PROBIOTIC PO) Take 1 tablet by mouth daily.   Yes Historical Provider, MD  sodium chloride (OCEAN) 0.65 % nasal spray Place 1 spray into the nose 3 (three) times daily as needed for congestion.    Yes Historical Provider, MD  tacrolimus (PROGRAF) 1 MG capsule Take 1 mg by mouth See admin instructions. Dissolve 1 capsule (1 mg) in 500 ml of water and keep in refrigerator - swish and spit small amount twice daily 04/01/16  Yes Historical Provider, MD  tamsulosin (FLOMAX) 0.4 MG CAPS capsule Take 1 capsule (0.4 mg total) by mouth 2 (two) times daily. 04/13/16  Yes Noralee Space, MD  traMADol (ULTRAM) 50 MG tablet TAKE 1 TABLET THREE TIMES A DAY AS NEEDED FOR PAIN Patient taking differently: Take 50 mg by mouth 3 (three) times daily as needed (pain).  04/13/16  Yes Noralee Space, MD  glucose blood (ONE TOUCH ULTRA TEST) test strip TEST BLOOD SUGAR ONCE DAILY AS DIRECTED 01/27/16   Noralee Space, MD  loratadine (CLARITIN) 10 MG tablet Take 10 mg by mouth daily as needed for allergies.    Historical Provider, MD       Physical Exam: BP (!) 115/46 (BP Location: Left Arm)   Pulse 76   Temp 97.9 F (36.6 C) (Oral)   Resp 18   Ht 6' 1.75" (1.873 m)   Wt 96.2 kg (212 lb)   SpO2 97%   BMI 27.40  kg/m  General appearance: Well-developed, elderly adult male, alert and in no acute distress, sitting on edge of bed.   Eyes: Anicteric, conjunctiva pink, lids and lashes normal. PERRL.    ENT: No nasal deformity, discharge, epistaxis.  Hearing normal. OP moist, upper dentures, I do not appreciate lichen planus. Neck: No neck masses.  Trachea midline.  No thyromegaly/tenderness. Lymph: No cervical or supraclavicular lymphadenopathy. Skin: Warm and dry.  No jaundice.  No suspicious rashes or lesions. Cardiac: RRR, nl S1-S2, no murmurs appreciated.  Capillary refill is brisk.  JVP normal.  No LE edema.  Radial and DP pulses 2+ and symmetric. Respiratory: Normal respiratory rate and rhythm.  No wheezes.  Crackles at bilateral bases, atelectatic. Abdomen: Abdomen soft.  No TTP. No ascites, distension, hepatosplenomegaly.   MSK: No deformities or effusions.  No cyanosis or clubbing. Neuro: Cranial nerves normal.  Sensation intact to light touch. Speech is fluent.  Muscle strength normal.    Psych: Sensorium intact and responding to questions, attention normal.  Behavior appropriate.  Affect normal.  Judgment and insight appear normal.     Labs on Admission:  I have personally reviewed following labs and imaging studies: CBC:  Recent Labs Lab 11/12/16 0540  WBC 9.6  NEUTROABS 8.6*  HGB 12.7*  HCT 38.0*  MCV 94.1  PLT 0000000   Basic Metabolic Panel:  Recent Labs Lab 11/12/16 0540  NA 138  K 4.7  CL 107  CO2 25  GLUCOSE 183*  BUN 18  CREATININE 1.00  CALCIUM 9.2   GFR: Estimated Creatinine Clearance: 69.1 mL/min (by C-G formula based on SCr of 1 mg/dL).  Liver Function Tests: No results for input(s): AST, ALT, ALKPHOS, BILITOT, PROT, ALBUMIN in the last 168 hours. No results for input(s): LIPASE, AMYLASE in the last 168 hours. No results for input(s): AMMONIA in the last 168 hours. Coagulation Profile: No results for input(s): INR, PROTIME in the last 168 hours. Cardiac  Enzymes: No results for input(s): CKTOTAL, CKMB, CKMBINDEX, TROPONINI in the  last 168 hours. BNP (last 3 results) No results for input(s): PROBNP in the last 8760 hours. HbA1C: No results for input(s): HGBA1C in the last 72 hours. CBG: No results for input(s): GLUCAP in the last 168 hours. Lipid Profile: No results for input(s): CHOL, HDL, LDLCALC, TRIG, CHOLHDL, LDLDIRECT in the last 72 hours. Thyroid Function Tests: No results for input(s): TSH, T4TOTAL, FREET4, T3FREE, THYROIDAB in the last 72 hours. Anemia Panel: No results for input(s): VITAMINB12, FOLATE, FERRITIN, TIBC, IRON, RETICCTPCT in the last 72 hours. Sepsis Labs: Lactic acid 2.01--> 1.19 Invalid input(s): PROCALCITONIN, LACTICIDVEN Recent Results (from the past 240 hour(s))  Blood culture (routine x 2)     Status: None (Preliminary result)   Collection Time: 11/12/16  5:40 AM  Result Value Ref Range Status   Specimen Description BLOOD RIGHT ANTECUBITAL  Final   Special Requests   Final    BOTTLES DRAWN AEROBIC AND ANAEROBIC AER 5cc ANA 5cc   Culture   Final    NO GROWTH < 12 HOURS Performed at [facility], [address] [place], [place] [address]    Report Status PENDING  Incomplete  Blood culture (routine x 2)     Status: None (Preliminary result)   Collection Time: 11/12/16  5:45 AM  Result Value Ref Range Status   Specimen Description BLOOD LEFT ANTECUBITAL  Final   Special Requests   Final    BOTTLES DRAWN AEROBIC AND ANAEROBIC AER 5cc ANA 5cc   Culture   Final    NO GROWTH < 12 HOURS Performed at [facility], [place] [address] [place], [place] [address]    Report Status PENDING  Incomplete         Radiological Exams on Admission: Personally reviewed CXR shows focal opacity: Dg Chest 2 View  Result Date: 11/12/2016 CLINICAL DATA:  Epigastric pain, cough and shortness of breath for 2 hours. Fever and chills. EXAM: CHEST  2 VIEW COMPARISON:  Chest radiograph August 10, 2016 FINDINGS: The  cardiac silhouette is mildly enlarged. Calcified aortic knob. LEFT lower lung zone consolidation. No pleural effusion. No pneumothorax. Soft tissue planes included osseous structures are nonsuspicious. Osteopenia. Surgical clips project in anterior abdomen. IMPRESSION: LEFT lower lung zone consolidation/pneumonia. Followup PA and lateral chest X-ray is recommended in 3-4 weeks following trial of antibiotic therapy to ensure resolution and exclude underlying malignancy. Mild cardiomegaly. Electronically Signed   By: Elon Alas M.D.   On: 11/12/2016 06:25    EKG: Independently reviewed. Rate 96, QTc 391, RSR' pattern, no change when personally compared to previous.    Assessment/Plan  1. Sepsis from pneumonia:  Suspected source pneumonia. Organism unknown.   Patient meets criteria given tachycardia, tachypnea, fever, and evidence of organ dysfunction.  Lactate 2.01 mmol/L and repeat completed within 6 hours.  Despite a documented blood pressure of <90, this was spurious, full 30 cc/kg bolus not given.    -Sepsis bundle utilized:  -Blood and urine cultures drawn  -IVF overnight  -Antibiotics: ceftriaxone and azithromycin  -Obtain sputum culture  -Strep urine antigen ordered  -Repeat renal function and complete blood count in AM       2. COPD, uncomplicated:  No current exacerbation. -Continue Advair -Albuterol PRN -Resp care consult for chest PT and inhaler teaching  3. Non-insulin-dependent diabetes:  Well controlled. -Hold metformin  -SSI with meals  4. Hypertension and coronary disease primary prevention:  Soft BP on arrival -Hold Losartan until hemodyanmics clearer -Continue atenolol  5. Other  medications:  -Continue tamsulosin -Hold H2RA unles needed -Continue methotrexate -Continue Levsin PRN     DVT prophylaxis: Lovenox  Code Status: FULL  Family Communication: None present  Disposition Plan: Anticipate IV fluids and antibiotics, likely transition to  orals in 2-3 days and discharge to home Consults called: None Admission status: INPATIENT        Medical decision making: Patient seen at 8:07 PM on 11/12/2016.  What exists of the patient's chart was reviewed in depth and summarized above.  Clinical condition: stable.        Edwin Dada [facility] Pager [phone]       At the time of admission, it appears that the appropriate admission status for this patient is INPATIENT. This is judged to be reasonable and necessary in order to provide the required intensity of service to ensure the patient's safety given the presenting symptoms, physical exam findings, and initial radiographic and laboratory data in the context of their chronic comorbidities.  Together, these circumstances are felt to place him at high risk for further clinical deterioration threatening life, limb, or organ.   Patient requires inpatient status due to high intensity of service, high risk for further deterioration and high frequency of surveillance required because of this acute illness that poses a threat to life, limb or bodily function.  Present support inpatient status include: Pneumonia with evidence of sepsis induced hypoperfusion, PSI score 99, in the setting of underlying systemic disease (COPD, HTN DM, and CAD that are well controlled)  I certify that at the point of admission it is my clinical judgment that the patient will require inpatient hospital care spanning beyond 2 midnights from the point of admission and that early discharge would result in unnecessary risk of decompensation and readmission or threat to life, limb or bodily function.

## 2016-11-12 NOTE — ED Notes (Signed)
Gave tylenol ordered for Pt. One time 650mg   At [place] [facility]

## 2016-11-13 ENCOUNTER — Inpatient Hospital Stay (HOSPITAL_COMMUNITY): Payer: Medicare Other

## 2016-11-13 ENCOUNTER — Encounter (HOSPITAL_COMMUNITY): Payer: Self-pay | Admitting: Radiology

## 2016-11-13 DIAGNOSIS — A419 Sepsis, unspecified organism: Principal | ICD-10-CM

## 2016-11-13 DIAGNOSIS — R042 Hemoptysis: Secondary | ICD-10-CM | POA: Diagnosis present

## 2016-11-13 LAB — URINE CULTURE

## 2016-11-13 LAB — GLUCOSE, CAPILLARY
GLUCOSE-CAPILLARY: 111 mg/dL — AB (ref 65–99)
GLUCOSE-CAPILLARY: 195 mg/dL — AB (ref 65–99)
Glucose-Capillary: 121 mg/dL — ABNORMAL HIGH (ref 65–99)
Glucose-Capillary: 115 mg/dL — ABNORMAL HIGH (ref 65–99)

## 2016-11-13 LAB — CBC
HEMATOCRIT: 32.2 % — AB (ref 39.0–52.0)
Hemoglobin: 10.6 g/dL — ABNORMAL LOW (ref 13.0–17.0)
MCH: 30.5 pg (ref 26.0–34.0)
MCHC: 32.9 g/dL (ref 30.0–36.0)
MCV: 92.8 fL (ref 78.0–100.0)
PLATELETS: 116 10*3/uL — AB (ref 150–400)
RBC: 3.47 MIL/uL — ABNORMAL LOW (ref 4.22–5.81)
RDW: 14.4 % (ref 11.5–15.5)
WBC: 14.2 10*3/uL — AB (ref 4.0–10.5)

## 2016-11-13 LAB — BASIC METABOLIC PANEL
Anion gap: 8 (ref 5–15)
BUN: 20 mg/dL (ref 6–20)
CHLORIDE: 108 mmol/L (ref 101–111)
CO2: 22 mmol/L (ref 22–32)
CREATININE: 0.87 mg/dL (ref 0.61–1.24)
Calcium: 8.5 mg/dL — ABNORMAL LOW (ref 8.9–10.3)
GFR calc Af Amer: >60 mL/min (ref >60)
GFR calc non Af Amer: >60 mL/min (ref >60)
GLUCOSE: 114 mg/dL — AB (ref 65–99)
Potassium: 3.6 mmol/L (ref 3.5–5.1)
SODIUM: 138 mmol/L (ref 135–145)

## 2016-11-13 MED ORDER — METHYLPREDNISOLONE 4 MG PO TBPK
8.0000 mg | ORAL_TABLET | Freq: Every evening | ORAL | Status: AC
  Administered 2016-11-14: 8 mg via ORAL

## 2016-11-13 MED ORDER — METHYLPREDNISOLONE 4 MG PO TBPK
4.0000 mg | ORAL_TABLET | ORAL | Status: AC
Start: 2016-11-13 — End: 2016-11-13
  Administered 2016-11-13: 4 mg via ORAL

## 2016-11-13 MED ORDER — IOPAMIDOL (ISOVUE-370) INJECTION 76%
INTRAVENOUS | Status: AC
  Administered 2016-11-13: 80 mL
  Filled 2016-11-13: qty 100

## 2016-11-13 MED ORDER — METHYLPREDNISOLONE 4 MG PO TBPK
4.0000 mg | ORAL_TABLET | Freq: Three times a day (TID) | ORAL | Status: AC
  Administered 2016-11-14 (×3): 4 mg via ORAL

## 2016-11-13 MED ORDER — METHYLPREDNISOLONE 4 MG PO TBPK
8.0000 mg | ORAL_TABLET | Freq: Every evening | ORAL | Status: AC
  Administered 2016-11-13: 8 mg via ORAL

## 2016-11-13 MED ORDER — METHYLPREDNISOLONE 4 MG PO TBPK
4.0000 mg | ORAL_TABLET | ORAL | Status: AC
  Administered 2016-11-13: 4 mg via ORAL

## 2016-11-13 MED ORDER — METHYLPREDNISOLONE 4 MG PO TBPK
8.0000 mg | ORAL_TABLET | Freq: Every morning | ORAL | Status: AC
  Administered 2016-11-13: 8 mg via ORAL
  Filled 2016-11-13: qty 21

## 2016-11-13 MED ORDER — METHYLPREDNISOLONE 4 MG PO TBPK
4.0000 mg | ORAL_TABLET | Freq: Four times a day (QID) | ORAL | Status: DC
  Administered 2016-11-15 (×2): 4 mg via ORAL

## 2016-11-13 NOTE — Progress Notes (Signed)
[facility] Progress Note  Patient: Jesse Santos [id number]   PCP: Noralee Space, MD DOB: 06/03/1936   DOA: 11/12/2016   DOS: 11/13/2016   Date of Service: the patient was seen and examined on 11/13/2016   Subjective: Feeling better but continues to have left-sided chest pain. Complained about hemoptysis yesterday. No further episode today.  Brief hospital course: Pt. with PMH of COPD, type II DM, HTN, OSA; admitted on 11/12/2016, with complaint of shortness of breath, was found to have multilobar left-sided pneumonia. Currently further plan is continue IV antibiotics.  Assessment and Plan: 1. Sepsis from pneumonia:  Suspected source pneumonia. Organism unknown.   Patient meets criteria given tachycardia, tachypnea, fever, and evidence of organ dysfunction.  Lactate 2.01 mmol/L and repeat completed within 6 hours.  Despite a documented blood pressure of <90, this was spurious, full 30 cc/kg bolus not given.   Strep urine antigen ordereda sputum culture and blood culture currently pending. Shows multiple species. Continue IV ceftriaxone and azithromycin. Due to complaints of chest pain with hemoptysis a CT scan PE protocol was performed which is negative for pulmonary embolism and shows multifocal left lung pneumonia.            2. COPD, uncomplicated:  No current exacerbation. -Continue Advair -Albuterol PRN -Resp care consult for chest PT and inhaler teaching Refusing to use prednisone, agreeable to use Medrol Pak. Ordered.  3. Non-insulin-dependent diabetes:  Well controlled. -Hold metformin  -SSI with meals  4. Hypertension and coronary disease primary prevention:  Soft BP on arrival -Hold Losartan until hemodyanmics clearer So hold atenolol  5. Other medications:  -Continue tamsulosin -Hold H2RA unles needed Hold methotrexate -Continue Levsin PRN  Bowel regimen: last BM 11/11/2016 Diet: Cardiac diet DVT Prophylaxis: subcutaneous Heparin  Advance goals of  care discussion: Full code  Family Communication: family was present at bedside, at the time of interview. The pt provided permission to discuss medical plan with the family. Opportunity was given to ask question and all questions were answered satisfactorily.   Disposition:  Discharge to home. Expected discharge date: 11/15/2016,   Consultants: none Procedures: none  Antibiotics: Anti-infectives    Start     Dose/Rate Route Frequency Ordered Stop   11/13/16 0800  azithromycin (ZITHROMAX) tablet 500 mg     500 mg Oral Every 24 hours 11/12/16 1930 11/19/16 0759   11/13/16 0600  cefTRIAXone (ROCEPHIN) 1 g in dextrose 5 % 50 mL IVPB     1 g 100 mL/hr over 30 Minutes Intravenous Every 24 hours 11/12/16 1930 11/19/16 0559   11/12/16 0757  azithromycin (ZITHROMAX) 500 MG injection    Comments:  Leak, Brandi   : cabinet override      11/12/16 0757 11/12/16 1959   11/12/16 0645  cefTRIAXone (ROCEPHIN) 1 g in dextrose 5 % 50 mL IVPB     1 g 100 mL/hr over 30 Minutes Intravenous  Once 11/12/16 0641 11/12/16 0723   11/12/16 0645  azithromycin (ZITHROMAX) 500 mg in dextrose 5 % 250 mL IVPB     500 mg 250 mL/hr over 60 Minutes Intravenous  Once 11/12/16 0641 11/12/16 0859   11/12/16 0645  oseltamivir (TAMIFLU) capsule 75 mg     75 mg Oral  Once 11/12/16 0641 11/12/16 0653        Objective: Physical Exam: Vitals:   11/12/16 2106 11/13/16 0539 11/13/16 0922 11/13/16 1420  BP: (!) 98/50 (!) 103/49  (!) 121/53  Pulse: 72 70  78  Resp:  18 19  20   Temp: 98.8 F (37.1 C) 98.6 F (37 C)  98.5 F (36.9 C)  TempSrc: Oral Oral  Oral  SpO2: 94% 95% 95% 98%  Weight:      Height:        Intake/Output Summary (Last 24 hours) at 11/13/16 1915 Last data filed at 11/13/16 1504  Gross per 24 hour  Intake          2304.17 ml  Output                0 ml  Net          2304.17 ml   Filed Weights   11/12/16 0523  Weight: 96.2 kg (212 lb)    General: Alert, Awake and Oriented to Time, Place  and Person. Appear in moderate distress, affect appropriate Eyes: PERRL, Conjunctiva normal ENT: Oral Mucosa clear moist. Neck: no JVD, no Abnormal Mass Or lumps Cardiovascular: S1 and S2 Present, no Murmur, Respiratory: Bilateral Air entry equal and Decreased, no use of accessory muscle, basal Crackles, Occasional wheezes Abdomen: Bowel Sound present, Soft and no tenderness Skin: no redness, no Rash, no induration Extremities: trace Pedal edema, no calf tenderness Neurologic: Grossly no focal neuro deficit. Bilaterally Equal motor strength  Data Reviewed: CBC:  Recent Labs Lab 11/12/16 0540 11/13/16 0525  WBC 9.6 14.2*  NEUTROABS 8.6*  --   HGB 12.7* 10.6*  HCT 38.0* 32.2*  MCV 94.1 92.8  PLT 152 [id number]*   Basic Metabolic Panel:  Recent Labs Lab 11/12/16 0540 11/13/16 0525  NA 138 138  K 4.7 3.6  CL 107 108  CO2 25 22  GLUCOSE 183* 114*  BUN 18 20  CREATININE 1.00 0.87  CALCIUM 9.2 8.5*    Liver Function Tests: No results for input(s): AST, ALT, ALKPHOS, BILITOT, PROT, ALBUMIN in the last 168 hours. No results for input(s): LIPASE, AMYLASE in the last 168 hours. No results for input(s): AMMONIA in the last 168 hours. Coagulation Profile: No results for input(s): INR, PROTIME in the last 168 hours. Cardiac Enzymes: No results for input(s): CKTOTAL, CKMB, CKMBINDEX, TROPONINI in the last 168 hours. BNP (last 3 results) No results for input(s): PROBNP in the last 8760 hours.  CBG:  Recent Labs Lab 11/12/16 2105 11/13/16 0843 11/13/16 1204 11/13/16 1811  GLUCAP 128* 121* 115* 111*    Studies: Ct Angio Chest Pe W Or Wo Contrast  Result Date: 11/13/2016 CLINICAL DATA:  81 year old male shortness of breath and left side pleuritic pain. Initial encounter. EXAM: CT ANGIOGRAPHY CHEST WITH CONTRAST TECHNIQUE: Multidetector CT imaging of the chest was performed using the standard protocol during bolus administration of intravenous contrast. Multiplanar CT image  reconstructions and MIPs were obtained to evaluate the vascular anatomy. CONTRAST:  80 mL Isovue 370 COMPARISON:  CT Abdomen and Pelvis 12/27/2014 FINDINGS: Cardiovascular: Adequate contrast bolus timing in the pulmonary arterial tree. Mild lower lobe respiratory motion. No focal filling defect identified in the pulmonary arteries to suggest acute pulmonary embolism. Mild cardiomegaly. No pericardial effusion. Extensive coronary artery calcified atherosclerosis. Calcified aortic atherosclerosis with little IV contrast in the aorta on this exam. Mediastinum/Nodes: No mediastinal lymphadenopathy. Lungs/Pleura: Major airways are patent. There is consolidation in the posterior left upper lobe with air bronchograms. There is confluent peribronchial ground-glass and patchy opacity in the left lower lobe. Superimposed bilateral lower lobe mosaic attenuation probably due to gas trapping. Mild right lower lobe mostly dependent ground-glass opacity may simply reflect atelectasis. However there is a  small layering right pleural effusion. Upper Abdomen: Surgically absent gallbladder. Negative visible liver, spleen, pancreas, adrenal glands, kidneys and bowel in the upper abdomen. Musculoskeletal: Degenerative changes in the spine. No acute osseous abnormality identified. Review of the MIP images confirms the above findings. IMPRESSION: 1.  No evidence of acute pulmonary embolus. 2. Multilobar left lung pneumonia. Confluent consolidation in the posterior left upper lobe. Possible early right lower lobe involvement and trace right pleural effusion. 3. Calcified aortic and coronary artery atherosclerosis. Electronically Signed   By: Genevie Ann M.D.   On: 11/13/2016 17:25     Scheduled Meds: . acidophilus  1 capsule Oral Daily  . aspirin EC  81 mg Oral QHS  . azithromycin  500 mg Oral Q24H  . cefTRIAXone (ROCEPHIN)  IV  1 g Intravenous Q24H  . clotrimazole  10 mg Oral Daily  . enoxaparin (LOVENOX) injection  40 mg  Subcutaneous Q24H  . famotidine  40 mg Oral QHS  . folic acid  1 mg Oral Q lunch  . insulin aspart  0-5 Units Subcutaneous QHS  . insulin aspart  0-9 Units Subcutaneous TID WC  . [START ON 11/14/2016] methylPREDNISolone  4 mg Oral 3 x daily with food  . [START ON 11/15/2016] methylPREDNISolone  4 mg Oral 4X daily taper  . methylPREDNISolone  8 mg Oral Nightly  . [START ON 11/14/2016] methylPREDNISolone  8 mg Oral Nightly  . mometasone-formoterol  2 puff Inhalation BID  . polyethylene glycol  17 g Oral BID  . tamsulosin  0.4 mg Oral BID   Continuous Infusions: . sodium chloride 125 mL/hr at 11/13/16 1335   PRN Meds: acetaminophen **OR** acetaminophen, albuterol, hyoscyamine, ondansetron **OR** ondansetron (ZOFRAN) IV  Time spent: 30 minutes  Author: Berle Mull, MD [facility] Pager: [phone] 11/13/2016 7:15 PM  If 7PM-7AM, please contact night-coverage at [email or web address], password [facility]

## 2016-11-14 LAB — CBC WITH DIFFERENTIAL/PLATELET
Basophils Absolute: 0 10*3/uL (ref 0.0–0.1)
Basophils Relative: 0 %
Eosinophils Absolute: 0 10*3/uL (ref 0.0–0.7)
Eosinophils Relative: 0 %
HEMATOCRIT: 32.7 % — AB (ref 39.0–52.0)
Hemoglobin: 10.5 g/dL — ABNORMAL LOW (ref 13.0–17.0)
LYMPHS PCT: 4 %
Lymphs Abs: 0.5 10*3/uL — ABNORMAL LOW (ref 0.7–4.0)
MCH: 29.9 pg (ref 26.0–34.0)
MCHC: 32.1 g/dL (ref 30.0–36.0)
MCV: 93.2 fL (ref 78.0–100.0)
MONO ABS: 0.4 10*3/uL (ref 0.1–1.0)
MONOS PCT: 4 %
NEUTROS ABS: 11.1 10*3/uL — AB (ref 1.7–7.7)
Neutrophils Relative %: 92 %
Platelets: 137 10*3/uL — ABNORMAL LOW (ref 150–400)
RBC: 3.51 MIL/uL — ABNORMAL LOW (ref 4.22–5.81)
RDW: 14 % (ref 11.5–15.5)
WBC: 12.1 10*3/uL — ABNORMAL HIGH (ref 4.0–10.5)

## 2016-11-14 LAB — BASIC METABOLIC PANEL
Anion gap: 8 (ref 5–15)
BUN: 15 mg/dL (ref 6–20)
CALCIUM: 8.8 mg/dL — AB (ref 8.9–10.3)
CO2: 21 mmol/L — AB (ref 22–32)
Chloride: 108 mmol/L (ref 101–111)
Creatinine, Ser: 0.72 mg/dL (ref 0.61–1.24)
GFR calc Af Amer: >60 mL/min (ref >60)
GLUCOSE: 153 mg/dL — AB (ref 65–99)
Potassium: 4.5 mmol/L (ref 3.5–5.1)
Sodium: 137 mmol/L (ref 135–145)

## 2016-11-14 LAB — GLUCOSE, CAPILLARY
GLUCOSE-CAPILLARY: 164 mg/dL — AB (ref 65–99)
GLUCOSE-CAPILLARY: 128 mg/dL — AB (ref 65–99)
GLUCOSE-CAPILLARY: 166 mg/dL — AB (ref 65–99)
Glucose-Capillary: 132 mg/dL — ABNORMAL HIGH (ref 65–99)

## 2016-11-14 MED ORDER — LOSARTAN POTASSIUM 50 MG PO TABS
25.0000 mg | ORAL_TABLET | Freq: Every day | ORAL | Status: DC
  Administered 2016-11-14 – 2016-11-15 (×2): 25 mg via ORAL
  Filled 2016-11-14 (×2): qty 1

## 2016-11-14 MED ORDER — DARBEPOETIN ALFA 100 MCG/0.5ML IJ SOSY
PREFILLED_SYRINGE | INTRAMUSCULAR | Status: AC
  Filled 2016-11-14: qty 0.5

## 2016-11-14 NOTE — Evaluation (Signed)
Physical Therapy Evaluation Patient Details Name: Jesse Santos MRN: [id number] DOB: 07/11/1936 Today's Date: 11/14/2016   History of Present Illness  Pt is a 81 y/o male admitted secondary to sepsis and pneumonia. PMH including but not limited to COPD, CAD and HTN.  Clinical Impression  Pt presented sitting EOB when PT entered room. Prior to admission, pt reported that he was independent with all functional mobility and ADLs. Pt states that he is very active with mowing his lawn and ambulating on his treadmill. Pt ambulated 300' on RA with supervision with SPO2 maintaining >95% throughout. No further acute PT needs identified at this time. PT signing off.    Follow Up Recommendations No PT follow up    Equipment Recommendations  None recommended by PT    Recommendations for Other Services       Precautions / Restrictions Precautions Precautions: None Restrictions Weight Bearing Restrictions: No      Mobility  Bed Mobility Overal bed mobility: Modified Independent                Transfers Overall transfer level: Modified independent Equipment used: None                Ambulation/Gait Ambulation/Gait assistance: Supervision Ambulation Distance (Feet): 300 Feet Assistive device: None Gait Pattern/deviations: Step-through pattern;Decreased stride length Gait velocity: WFL Gait velocity interpretation: at or above normal speed for age/gender General Gait Details: stable, steady gait pattern, no LOB or need for physical assistance, supervision for safety  Stairs            Wheelchair Mobility    Modified Rankin (Stroke Patients Only)       Balance Overall balance assessment: Needs assistance Sitting-balance support: Feet supported Sitting balance-Leahy Scale: Good     Standing balance support: During functional activity;No upper extremity supported Standing balance-Leahy Scale: Fair                               Pertinent  Vitals/Pain Pain Assessment: Faces Faces Pain Scale: Hurts a little bit Pain Location: R hip Pain Descriptors / Indicators: Sore;Sharp Pain Intervention(s): Monitored during session;Repositioned    Home Living Family/patient expects to be discharged to:: Private residence Living Arrangements: Spouse/significant other Available Help at Discharge: Family;Available 24 hours/day Type of Home: House Home Access: Stairs to enter Entrance Stairs-Rails: None Entrance Stairs-Number of Steps: 2 Home Layout: One level Home Equipment: Shower seat      Prior Function Level of Independence: Independent         Comments: pt reports he is very active, has a treadmill at home that he walks on regularly     Hand Dominance        Extremity/Trunk Assessment   Upper Extremity Assessment Upper Extremity Assessment: Overall WFL for tasks assessed    Lower Extremity Assessment Lower Extremity Assessment: Overall WFL for tasks assessed    Cervical / Trunk Assessment Cervical / Trunk Assessment: Normal  Communication   Communication: No difficulties  Cognition Arousal/Alertness: Awake/alert Behavior During Therapy: WFL for tasks assessed/performed Overall Cognitive Status: Within Functional Limits for tasks assessed                      General Comments      Exercises     Assessment/Plan    PT Assessment Patent does not need any further PT services  PT Problem List         PT  Treatment Interventions      PT Goals (Current goals can be found in the Care Plan section)  Acute Rehab PT Goals Patient Stated Goal: return home    Frequency     Barriers to discharge        Co-evaluation               End of Session   Activity Tolerance: Patient tolerated treatment well Patient left: in bed;with call bell/phone within reach;with nursing/sitter in room Nurse Communication: Mobility status PT Visit Diagnosis: Other abnormalities of gait and mobility  (R26.89)         Time: 0840-0900 PT Time Calculation (min) (ACUTE ONLY): 20 min   Charges:   PT Evaluation $PT Eval Low Complexity: 1 Procedure     PT G CodesClearnce Sorrel [place] 11/14/2016, 9:42 AM Sherie Don, PT, DPT [phone]

## 2016-11-14 NOTE — Progress Notes (Signed)
[facility] Progress Note  Patient: Jesse Santos [id number]   PCP: Noralee Space, MD DOB: 1935-11-05   DOA: 11/12/2016   DOS: 11/14/2016   Date of Service: the patient was seen and examined on 11/14/2016   Subjective: Feeling better And denies any acute complaint. Continues to have some left-sided discomfort. No hemoptysis  Brief hospital course: Pt. with PMH of COPD, type II DM, HTN, OSA; admitted on 11/12/2016, with complaint of shortness of breath, was found to have multilobar left-sided pneumonia. Currently further plan is continue IV antibiotics.  Assessment and Plan: 1. Sepsis from pneumonia:  Suspected source pneumonia. Organism unknown.   Patient meets criteria given tachycardia, tachypnea, fever, and evidence of organ dysfunction.  Lactate 2.01 mmol/L and repeat completed within 6 hours.  Despite a documented blood pressure of <90, this was spurious, full 30 cc/kg bolus not given.   Strep urine antigen ordereda sputum culture and blood culture currently pending. Shows multiple species. Continue IV ceftriaxone and azithromycin. Due to complaints of chest pain with hemoptysis a CT scan PE protocol was performed which is negative for pulmonary embolism and shows multifocal left lung pneumonia.            2. COPD, uncomplicated:  No current exacerbation. -Continue Advair -Albuterol PRN -Resp care consult for chest PT and inhaler teaching Refusing to use prednisone, agreeable to use Medrol Pak. Ordered.  3. Non-insulin-dependent diabetes:  Well controlled. -Hold metformin  -SSI with meals  4. Hypertension and coronary disease primary prevention:  Pedal edema Soft BP since admission, blood pressure improved today. Discontinue IV fluids. Resume low-dose losartan. Hold atenolol. Use TED stockings.  5. Other medications:  -Continue tamsulosin -Hold H2RA unles needed Hold methotrexate -Continue Levsin PRN  Bowel regimen: last BM 11/11/2016 Diet: Cardiac diet DVT  Prophylaxis: subcutaneous Heparin  Advance goals of care discussion: Full code  Family Communication: no family was present at bedside, at the time of interview.   Disposition:  Discharge to home. Expected discharge date: 11/15/2016, depending on improvement in oxygenation  Consultants: none Procedures: none  Antibiotics: Anti-infectives    Start     Dose/Rate Route Frequency Ordered Stop   11/13/16 0800  azithromycin (ZITHROMAX) tablet 500 mg     500 mg Oral Every 24 hours 11/12/16 1930 11/19/16 0759   11/13/16 0600  cefTRIAXone (ROCEPHIN) 1 g in dextrose 5 % 50 mL IVPB     1 g 100 mL/hr over 30 Minutes Intravenous Every 24 hours 11/12/16 1930 11/19/16 0559   11/12/16 0757  azithromycin (ZITHROMAX) 500 MG injection    Comments:  Leak, Brandi   : cabinet override      11/12/16 0757 11/12/16 1959   11/12/16 0645  cefTRIAXone (ROCEPHIN) 1 g in dextrose 5 % 50 mL IVPB     1 g 100 mL/hr over 30 Minutes Intravenous  Once 11/12/16 0641 11/12/16 0723   11/12/16 0645  azithromycin (ZITHROMAX) 500 mg in dextrose 5 % 250 mL IVPB     500 mg 250 mL/hr over 60 Minutes Intravenous  Once 11/12/16 0641 11/12/16 0859   11/12/16 0645  oseltamivir (TAMIFLU) capsule 75 mg     75 mg Oral  Once 11/12/16 0641 11/12/16 0653        Objective: Physical Exam: Vitals:   11/14/16 0521 11/14/16 0946 11/14/16 1300 11/14/16 1539  BP: 129/62  (!) 143/73 139/67  Pulse: 66  77 75  Resp: 18  18   Temp: 97.5 F (36.4 C)  98.1 F (  36.7 C)   TempSrc: Oral  Oral   SpO2: 95% 95% 100%   Weight:      Height:        Intake/Output Summary (Last 24 hours) at 11/14/16 1552 Last data filed at 11/14/16 1048  Gross per 24 hour  Intake              360 ml  Output                0 ml  Net              360 ml   Filed Weights   11/12/16 0523  Weight: 96.2 kg (212 lb)    General: Alert, Awake and Oriented to Time, Place and Person. Appear in moderate distress, affect appropriate Eyes: PERRL, Conjunctiva  normal ENT: Oral Mucosa clear moist. Neck: no JVD, no Abnormal Mass Or lumps Cardiovascular: S1 and S2 Present, no Murmur, Respiratory: Bilateral Air entry equal and Decreased, no use of accessory muscle, basal Crackles, Occasional wheezes Abdomen: Bowel Sound present, Soft and no tenderness Skin: no redness, no Rash, no induration Extremities: bilateral Pedal edema, no calf tenderness Neurologic: Grossly no focal neuro deficit. Bilaterally Equal motor strength  Data Reviewed: CBC:  Recent Labs Lab 11/12/16 0540 11/13/16 0525 11/14/16 0421  WBC 9.6 14.2* 12.1*  NEUTROABS 8.6*  --  11.1*  HGB 12.7* 10.6* 10.5*  HCT 38.0* 32.2* 32.7*  MCV 94.1 92.8 93.2  PLT 152 116* 0000000*   Basic Metabolic Panel:  Recent Labs Lab 11/12/16 0540 11/13/16 0525 11/14/16 0421  NA 138 138 137  K 4.7 3.6 4.5  CL 107 108 108  CO2 25 22 21*  GLUCOSE 183* 114* 153*  BUN 18 20 15   CREATININE 1.00 0.87 0.72  CALCIUM 9.2 8.5* 8.8*    Liver Function Tests: No results for input(s): AST, ALT, ALKPHOS, BILITOT, PROT, ALBUMIN in the last 168 hours. No results for input(s): LIPASE, AMYLASE in the last 168 hours. No results for input(s): AMMONIA in the last 168 hours. Coagulation Profile: No results for input(s): INR, PROTIME in the last 168 hours. Cardiac Enzymes: No results for input(s): CKTOTAL, CKMB, CKMBINDEX, TROPONINI in the last 168 hours. BNP (last 3 results) No results for input(s): PROBNP in the last 8760 hours.  CBG:  Recent Labs Lab 11/13/16 1204 11/13/16 1811 11/13/16 2139 11/14/16 0825 11/14/16 1258  GLUCAP 115* 111* 195* 132* 164*    Studies: Ct Angio Chest Pe W Or Wo Contrast  Result Date: 11/13/2016 CLINICAL DATA:  81 year old male shortness of breath and left side pleuritic pain. Initial encounter. EXAM: CT ANGIOGRAPHY CHEST WITH CONTRAST TECHNIQUE: Multidetector CT imaging of the chest was performed using the standard protocol during bolus administration of intravenous  contrast. Multiplanar CT image reconstructions and MIPs were obtained to evaluate the vascular anatomy. CONTRAST:  80 mL Isovue 370 COMPARISON:  CT Abdomen and Pelvis 12/27/2014 FINDINGS: Cardiovascular: Adequate contrast bolus timing in the pulmonary arterial tree. Mild lower lobe respiratory motion. No focal filling defect identified in the pulmonary arteries to suggest acute pulmonary embolism. Mild cardiomegaly. No pericardial effusion. Extensive coronary artery calcified atherosclerosis. Calcified aortic atherosclerosis with little IV contrast in the aorta on this exam. Mediastinum/Nodes: No mediastinal lymphadenopathy. Lungs/Pleura: Major airways are patent. There is consolidation in the posterior left upper lobe with air bronchograms. There is confluent peribronchial ground-glass and patchy opacity in the left lower lobe. Superimposed bilateral lower lobe mosaic attenuation probably due to gas trapping. Mild right lower  lobe mostly dependent ground-glass opacity may simply reflect atelectasis. However there is a small layering right pleural effusion. Upper Abdomen: Surgically absent gallbladder. Negative visible liver, spleen, pancreas, adrenal glands, kidneys and bowel in the upper abdomen. Musculoskeletal: Degenerative changes in the spine. No acute osseous abnormality identified. Review of the MIP images confirms the above findings. IMPRESSION: 1.  No evidence of acute pulmonary embolus. 2. Multilobar left lung pneumonia. Confluent consolidation in the posterior left upper lobe. Possible early right lower lobe involvement and trace right pleural effusion. 3. Calcified aortic and coronary artery atherosclerosis. Electronically Signed   By: Genevie Ann M.D.   On: 11/13/2016 17:25     Scheduled Meds: . acidophilus  1 capsule Oral Daily  . aspirin EC  81 mg Oral QHS  . azithromycin  500 mg Oral Q24H  . cefTRIAXone (ROCEPHIN)  IV  1 g Intravenous Q24H  . clotrimazole  10 mg Oral Daily  . enoxaparin  (LOVENOX) injection  40 mg Subcutaneous Q24H  . famotidine  40 mg Oral QHS  . folic acid  1 mg Oral Q lunch  . insulin aspart  0-5 Units Subcutaneous QHS  . insulin aspart  0-9 Units Subcutaneous TID WC  . losartan  25 mg Oral Daily  . methylPREDNISolone  4 mg Oral 3 x daily with food  . [START ON 11/15/2016] methylPREDNISolone  4 mg Oral 4X daily taper  . methylPREDNISolone  8 mg Oral Nightly  . mometasone-formoterol  2 puff Inhalation BID  . polyethylene glycol  17 g Oral BID  . tamsulosin  0.4 mg Oral BID   Continuous Infusions:  PRN Meds: acetaminophen **OR** acetaminophen, albuterol, hyoscyamine, ondansetron **OR** ondansetron (ZOFRAN) IV  Time spent: 30 minutes  Author: Berle Mull, MD [facility] Pager: [phone] 11/14/2016 3:52 PM  If 7PM-7AM, please contact night-coverage at [email or web address], password [facility]

## 2016-11-15 LAB — BASIC METABOLIC PANEL
Anion gap: 5 (ref 5–15)
BUN: 16 mg/dL (ref 6–20)
CALCIUM: 8.9 mg/dL (ref 8.9–10.3)
CO2: 24 mmol/L (ref 22–32)
CREATININE: 0.75 mg/dL (ref 0.61–1.24)
Chloride: 109 mmol/L (ref 101–111)
GFR calc Af Amer: >60 mL/min (ref >60)
Glucose, Bld: 162 mg/dL — ABNORMAL HIGH (ref 65–99)
POTASSIUM: 4.4 mmol/L (ref 3.5–5.1)
Sodium: 138 mmol/L (ref 135–145)

## 2016-11-15 LAB — GLUCOSE, CAPILLARY
GLUCOSE-CAPILLARY: 126 mg/dL — AB (ref 65–99)
Glucose-Capillary: 141 mg/dL — ABNORMAL HIGH (ref 65–99)

## 2016-11-15 MED ORDER — AZITHROMYCIN 500 MG PO TABS: 500.0000 mg | ORAL_TABLET | Freq: Every day | ORAL | 0 refills | Status: AC

## 2016-11-15 MED ORDER — METFORMIN HCL 500 MG PO TABS: 500.0000 mg | ORAL_TABLET | Freq: Two times a day (BID) | ORAL | Status: DC

## 2016-11-15 MED ORDER — LOSARTAN POTASSIUM 25 MG PO TABS: 25.0000 mg | ORAL_TABLET | Freq: Every day | ORAL | 0 refills | Status: DC

## 2016-11-15 MED ORDER — METHOTREXATE 2.5 MG PO TABS: 7.5000 mg | ORAL_TABLET | ORAL | 0 refills | Status: DC

## 2016-11-15 MED ORDER — METHYLPREDNISOLONE 4 MG PO TABS
ORAL_TABLET | ORAL | 0 refills | Status: DC
Start: 2016-11-15 — End: 2016-12-09

## 2016-11-15 MED ORDER — POLYETHYLENE GLYCOL 3350 17 GM/SCOOP PO POWD: ORAL | 5 refills | Status: DC

## 2016-11-15 NOTE — Progress Notes (Signed)
Collier Bullock to be D/C'd  per MD order.  Discussed with the patient and all questions fully answered.  VSS, Skin clean, dry and intact without evidence of skin break down, no evidence of skin tears noted. IV catheter discontinued intact. Site without signs and symptoms of complications. Dressing and pressure applied.  An After Visit Summary was printed and given to the patient. Patient received prescription.  D/c education completed with patient/family including follow up instructions, medication list, d/c activities limitations if indicated, with other d/c instructions as indicated by MD - patient able to verbalize understanding, all questions fully answered.   Patient instructed to return to ED, call 911, or call MD for any changes in condition.   Patient escorted via [place], and D/C home via private auto.  Milas Hock 11/15/2016 1:10 PM

## 2016-11-16 NOTE — Consult Note (Signed)
           [facility] CM Primary Care Navigator  11/16/2016  Jesse Santos [birth year] [id number]   Went to see patient at the bedside to identify possible discharge needs but he was already discharged home yesterday per staff.  Primary care provider's office called Wilburn Cornelia) to notify of patient's discharge and need for post hospital follow-up and transition of care. Wilburn Cornelia stated that patient has a scheduled appointment follow-up with Dr. Lenna Gilford on 11/25/16 at 9:30 am. Made aware to refer patient to [facility] care management if deemed appropriate for services.  For questions, please contact:  Dannielle Huh, BSN, RN- [facility] Primary Care Navigator  Telephone: [phone] [place]

## 2016-11-17 ENCOUNTER — Telehealth: Payer: Self-pay | Admitting: Internal Medicine

## 2016-11-17 LAB — CULTURE, BLOOD (ROUTINE X 2)
CULTURE: NO GROWTH
Culture: NO GROWTH

## 2016-11-17 NOTE — Telephone Encounter (Signed)
[facility] TELEPHONE ENCOUNTER NOTE  Patient: Jesse Santos [id number]   PCP: Noralee Space, MD DOB: 1936/04/20     DOS: 11/17/2016   I called the pharmacy and prescribed additional 3 days of Keflex 500 mg twice a day to finish the antibiotic course for pneumonia. Also call the patient and informed regarding this prescription. Patient is due to follow-up with PCP tomorrow for some earache as well as post hospital follow-up.  Author: Berle Mull, MD [facility] Pager: [phone] 11/17/2016 4:43 PM   If 7PM-7AM, please contact night-coverage at [email or web address], password [facility]

## 2016-11-17 NOTE — Discharge Summary (Signed)
[facility] Discharge Summary   Patient: Jesse Santos [id number]   PCP: Noralee Space, MD DOB: 1935-11-21   Date of admission: 11/12/2016   Date of discharge: 11/15/2016    Discharge Diagnoses:  Principal Problem:   Sepsis, unspecified organism [facility]) Active Problems:   Obstructive sleep apnea   Essential hypertension   COPD exacerbation ([place])   COPD (chronic obstructive pulmonary disease) with chronic bronchitis ([place])   Type 2 diabetes mellitus without complication, without long-term current use of insulin ([place])   Community acquired pneumonia of left lower lobe of lung ([place])   Hemoptysis   Admitted From: home Disposition:  home  Recommendations for Outpatient Follow-up:  1. Follow-up with PCP in one week   Follow-up Information    NADEL,SCOTT M, MD. Schedule an appointment as soon as possible for a visit in 1 week(s).   Specialty:  Pulmonary Disease Contact information: [place] [id number] [phone]          Diet recommendation: Cardiac diet  Activity: The patient is advised to gradually reintroduce usual activities.  Discharge Condition: good  Code Status: Full code  History of present illness: As per the H and P dictated on admission, "Jesse Santos is a 81 y.o. male with a past medical history significant for COPD mild, NIDDM, HTN, and CAD without PCI who presents with cough and rigors for 1 day.  The patient was in his usual state of health until this morning around 1AM he woke with cough, rigors, malaise.  He went and laid in the other room, but around 4 AM he woke his wife say he had to go to the ER because of severe rigors, malaise, productive cough, pleuritic pain, and myalgias.  ED course: -Temp 103.84F, heart rate 101, respirations 18-30, blood pressure 138/97, pulse oximetry 94% on 2L [place], but no recorded hypoxia -Na 138, K 4.7, Cr 1.00, WBC 9.6K, Hgb 12.7 -Lactic acid 2.01 --> repeat within six hours 1.19 -He had one  recorded BP < 90 systolic but did NOT receive 30cc/kg IVF from EDP and it appears this was a spurious measurement because repeat BP within 30 minutes was normal without intervention -CXR showed LLL opacity, not wheezing on exam, had rusty sputum in ER -Influenza negative -He was given ceftraixoen and azithromycin and TRH were asekd to accept in transfer"  Hospital Course:   Summary of his active problems in the hospital is as following. 1. Sepsis from pneumonia: Suspected source pneumonia. Organism unknown.   Patient meets criteria given tachycardia, tachypnea, fever, and evidence of organ dysfunction. Lactate 2.38mmol/L. Strep urine antigen ordereda sputum culture and blood culture currently doing no growth. Urine culture Shows multiple species.  He was treated IV ceftriaxone and azithromycin. Discharge with oral azithromycin. Due to complaints of chest pain with hemoptysis a CT scan PE protocol was performed which is negative for pulmonary embolism and shows multifocal left lung pneumonia.  2. COPD, uncomplicated: No current exacerbation. -Continue Advair -Albuterol PRN - Refusing to use prednisone, agreeable to use Medrol Pak. Ordered.  3. Non-insulin-dependent diabetes: Well controlled. -Hold metformin for 48 hours Continue home regimen  4. Hypertension and coronary disease primary prevention: Pedal edema Soft BP since admission, blood pressure improved today. Plan patient was given IV fluids. Resume low-dose losartan. Hold atenolol. Use TED stockings.  5. Other medications: -Continue tamsulosin Hold methotrexate for one dose -Continue Levsin PRN  All other chronic medical condition were stable during the hospitalization.  Patient was ambulatory without  any assistance, also seen by physical therapy, who recommended no therapy follow up. On the day of the discharge the patient's vitals were stable, and no other acute medical condition were reported by patient.  the patient was felt safe to be discharge at home with family.  Procedures and Results:  none   Consultations:  none  DISCHARGE MEDICATION: Discharge Medication List as of 11/15/2016 12:10 PM    START taking these medications   Details  azithromycin (ZITHROMAX) 500 MG tablet Take 1 tablet (500 mg total) by mouth daily., Starting Mon 11/16/2016, Until Wed 11/18/2016, Normal    methylPREDNISolone (MEDROL) 4 MG tablet Take 1tab twice a day for 1day, Take 1tab three times a day for 1day, Take 1tab twice a day for 1day, Take 1tab a day for 1day, and stop., Normal      CONTINUE these medications which have CHANGED   Details  losartan (COZAAR) 25 MG tablet Take 1 tablet (25 mg total) by mouth daily., Starting Sun 11/15/2016, Normal    metFORMIN (GLUCOPHAGE) 500 MG tablet Take 1 tablet (500 mg total) by mouth 2 (two) times daily with a meal., Starting Mon 11/16/2016, No Print    methotrexate (RHEUMATREX) 2.5 MG tablet Take 3 tablets (7.5 mg total) by mouth See admin instructions. Take 3 tablets (7.5 mg) by mouth on Tuesdays with lunch, starting 11/24/2016, Starting Sun 11/15/2016, No Print    polyethylene glycol powder (GLYCOLAX/MIRALAX) powder TAKE 17 GRAMS BY MOUTH TWICE DAILY AS NEEDED, No Print      CONTINUE these medications which have NOT CHANGED   Details  acetaminophen (TYLENOL) 500 MG tablet Take 500 mg by mouth every 6 (six) hours as needed for headache (pain). , Historical Med    aspirin EC 81 MG tablet Take 81 mg by mouth at bedtime., Historical Med    Cholecalciferol (VITAMIN D) 2000 UNITS CAPS Take 2,000 Units by mouth daily. , Historical Med    clotrimazole (MYCELEX) 10 MG troche Take 10 mg by mouth daily. , Starting Wed 04/01/2016, Historical Med    COMPRO 25 MG suppository PLACE 1 SUPPOSITORY (25 MG TOTAL) RECTALLY EVERY 12 (TWELVE) HOURS AS NEEDED FOR NAUSEA., Normal    dextromethorphan-guaiFENesin (MUCINEX DM) 30-600 MG per 12 hr tablet Take 1 tablet by mouth every 12  (twelve) hours., Historical Med    famotidine (PEPCID) 40 MG tablet Take 1 tablet (40 mg total) by mouth daily., Starting Thu 10/15/2016, Print    fluticasone (FLONASE) 50 MCG/ACT nasal spray Place 2 sprays into both nostrils daily., Starting Mon 04/13/2016, Print    fluticasone-salmeterol (ADVAIR HFA) 115-21 MCG/ACT inhaler Inhale 1 puff into the lungs 2 (two) times daily., Starting Mon [id number], Print    folic acid (FOLVITE) 1 MG tablet Take 1 mg by mouth daily with lunch. , Starting Tue 07/21/2016, Historical Med    glucose blood (ONE TOUCH ULTRA TEST) test strip TEST BLOOD SUGAR ONCE DAILY AS DIRECTED, Normal    hyoscyamine (LEVSIN SL) 0.125 MG SL tablet Place 1 tablet (0.125 mg total) under the tongue every 4 (four) hours as needed. As needed for abdominal cramping, Starting Thu 10/15/2016, Print    lidocaine (ASPERCREME W/LIDOCAINE) 4 % cream Apply 1 application topically daily as needed (pain). , Historical Med    loratadine (CLARITIN) 10 MG tablet Take 10 mg by mouth daily as needed for allergies., Historical Med    Multiple Vitamins-Minerals (MULTIVITAMIN WITH MINERALS) tablet Take 1 tablet by mouth daily.  , Until Discontinued, Historical Med  niacin (NIASPAN) 500 MG CR tablet Take 1 tablet (500 mg total) by mouth at bedtime., Starting Mon 04/13/2016, Print    nitroGLYCERIN (NITROSTAT) 0.4 MG SL tablet Place 1 tablet (0.4 mg total) under the tongue every 5 (five) minutes as needed for chest pain., Starting Mon 01/08/2014, Normal    Probiotic Product (PROBIOTIC PO) Take 1 tablet by mouth daily., Historical Med    sodium chloride (OCEAN) 0.65 % nasal spray Place 1 spray into the nose 3 (three) times daily as needed for congestion. , Historical Med    tacrolimus (PROGRAF) 1 MG capsule Take 1 mg by mouth See admin instructions. Dissolve 1 capsule (1 mg) in 500 ml of water and keep in refrigerator - swish and spit small amount twice daily, Starting Wed 04/01/2016, Historical Med      tamsulosin (FLOMAX) 0.4 MG CAPS capsule Take 1 capsule (0.4 mg total) by mouth 2 (two) times daily., Starting Mon 04/13/2016, Print    traMADol (ULTRAM) 50 MG tablet TAKE 1 TABLET THREE TIMES A DAY AS NEEDED FOR PAIN, Print      STOP taking these medications     atenolol (TENORMIN) 25 MG tablet        Allergies  Allergen Reactions  . Prednisone Other (See Comments)    REACTION: high dose intolerance - causes numbness from waist down - low dose tapered course ok   Discharge Instructions    Diet - low sodium heart healthy    Complete by:  As directed    Discharge instructions    Complete by:  As directed    It is important that you read following instructions as well as go over your medication list with RN to help you understand your care after this hospitalization.  Discharge Instructions: Please follow-up with PCP in one week  Please request your primary care physician to go over all Hospital Tests and Procedure/Radiological results at the follow up,  Please get all Hospital records sent to your PCP by signing hospital release before you go home.   You were cared for by a hospitalist during your hospital stay. If you have any questions about your discharge medications or the care you received while you were in the hospital after you are discharged, you can call the unit and ask to speak with the hospitalist on call if the hospitalist that took care of you is not available.  Once you are discharged, your primary care physician will handle any further medical issues. Please note that NO REFILLS for any discharge medications will be authorized once you are discharged, as it is imperative that you return to your primary care physician (or establish a relationship with a primary care physician if you do not have one) for your aftercare needs so that they can reassess your need for medications and monitor your lab values. You Must read complete instructions/literature along with all the  possible adverse reactions/side effects for all the Medicines you take and that have been prescribed to you. Take any new Medicines after you have completely understood and accept all the possible adverse reactions/side effects. Wear Seat belts while driving.   Increase activity slowly    Complete by:  As directed      Discharge Exam: Filed Weights   11/12/16 0523  Weight: 96.2 kg (212 lb)   Vitals:   11/14/16 2319 11/15/16 0637  BP: 137/69 117/62  Pulse: 68 63  Resp: 18 20  Temp: 99.3 F (37.4 C) 99.2 F (37.3 C)  General: Appear in no distress, no Rash; Oral Mucosa moist. Cardiovascular: S1 and S2 Present, no Murmur, no JVD Respiratory: Bilateral Air entry present and Clear to Auscultation, nio Crackles, no wheezes Abdomen: Bowel Sound present, Soft and no tenderness Extremities: no Pedal edema, no calf tenderness Neurology: Grossly no focal neuro deficit.  The results of significant diagnostics from this hospitalization (including imaging, microbiology, ancillary and laboratory) are listed below for reference.    Significant Diagnostic Studies: Dg Chest 2 View  Result Date: 11/12/2016 CLINICAL DATA:  Epigastric pain, cough and shortness of breath for 2 hours. Fever and chills. EXAM: CHEST  2 VIEW COMPARISON:  Chest radiograph August 10, 2016 FINDINGS: The cardiac silhouette is mildly enlarged. Calcified aortic knob. LEFT lower lung zone consolidation. No pleural effusion. No pneumothorax. Soft tissue planes included osseous structures are nonsuspicious. Osteopenia. Surgical clips project in anterior abdomen. IMPRESSION: LEFT lower lung zone consolidation/pneumonia. Followup PA and lateral chest X-ray is recommended in 3-4 weeks following trial of antibiotic therapy to ensure resolution and exclude underlying malignancy. Mild cardiomegaly. Electronically Signed   By: Elon Alas M.D.   On: 11/12/2016 06:25   Ct Angio Chest Pe W Or Wo Contrast  Result Date:  11/13/2016 CLINICAL DATA:  81 year old male shortness of breath and left side pleuritic pain. Initial encounter. EXAM: CT ANGIOGRAPHY CHEST WITH CONTRAST TECHNIQUE: Multidetector CT imaging of the chest was performed using the standard protocol during bolus administration of intravenous contrast. Multiplanar CT image reconstructions and MIPs were obtained to evaluate the vascular anatomy. CONTRAST:  80 mL Isovue 370 COMPARISON:  CT Abdomen and Pelvis 12/27/2014 FINDINGS: Cardiovascular: Adequate contrast bolus timing in the pulmonary arterial tree. Mild lower lobe respiratory motion. No focal filling defect identified in the pulmonary arteries to suggest acute pulmonary embolism. Mild cardiomegaly. No pericardial effusion. Extensive coronary artery calcified atherosclerosis. Calcified aortic atherosclerosis with little IV contrast in the aorta on this exam. Mediastinum/Nodes: No mediastinal lymphadenopathy. Lungs/Pleura: Major airways are patent. There is consolidation in the posterior left upper lobe with air bronchograms. There is confluent peribronchial ground-glass and patchy opacity in the left lower lobe. Superimposed bilateral lower lobe mosaic attenuation probably due to gas trapping. Mild right lower lobe mostly dependent ground-glass opacity may simply reflect atelectasis. However there is a small layering right pleural effusion. Upper Abdomen: Surgically absent gallbladder. Negative visible liver, spleen, pancreas, adrenal glands, kidneys and bowel in the upper abdomen. Musculoskeletal: Degenerative changes in the spine. No acute osseous abnormality identified. Review of the MIP images confirms the above findings. IMPRESSION: 1.  No evidence of acute pulmonary embolus. 2. Multilobar left lung pneumonia. Confluent consolidation in the posterior left upper lobe. Possible early right lower lobe involvement and trace right pleural effusion. 3. Calcified aortic and coronary artery atherosclerosis.  Electronically Signed   By: Genevie Ann M.D.   On: 11/13/2016 17:25    Microbiology: Recent Results (from the past 240 hour(s))  Blood culture (routine x 2)     Status: None   Collection Time: 11/12/16  5:40 AM  Result Value Ref Range Status   Specimen Description BLOOD RIGHT ANTECUBITAL  Final   Special Requests   Final    BOTTLES DRAWN AEROBIC AND ANAEROBIC AER 5cc ANA 5cc   Culture   Final    NO GROWTH 5 DAYS Performed at [facility], [address] [place], [place] [address]    Report Status 11/17/2016 FINAL  Final  Blood culture (routine x 2)     Status:  None   Collection Time: 11/12/16  5:45 AM  Result Value Ref Range Status   Specimen Description BLOOD LEFT ANTECUBITAL  Final   Special Requests   Final    BOTTLES DRAWN AEROBIC AND ANAEROBIC AER 5cc ANA 5cc   Culture   Final    NO GROWTH 5 DAYS Performed at [facility], [address] [place], [place] [address]    Report Status 11/17/2016 FINAL  Final  Urine culture     Status: Abnormal   Collection Time: 11/12/16  6:31 AM  Result Value Ref Range Status   Specimen Description URINE, CLEAN CATCH  Final   Special Requests NONE  Final   Culture MULTIPLE SPECIES PRESENT, SUGGEST RECOLLECTION (A)  Final   Report Status 11/13/2016 FINAL  Final    Labs: CBC:  Recent Labs Lab 11/12/16 0540 11/13/16 0525 11/14/16 0421  WBC 9.6 14.2* 12.1*  NEUTROABS 8.6*  --  11.1*  HGB 12.7* 10.6* 10.5*  HCT 38.0* 32.2* 32.7*  MCV 94.1 92.8 93.2  PLT 152 116* 0000000*   Basic Metabolic Panel:  Recent Labs Lab 11/12/16 0540 11/13/16 0525 11/14/16 0421 11/15/16 0411  NA 138 138 137 138  K 4.7 3.6 4.5 4.4  CL 107 108 108 109  CO2 25 22 21* 24  GLUCOSE 183* 114* 153* 162*  BUN 18 20 15 16   CREATININE 1.00 0.87 0.72 0.75  CALCIUM 9.2 8.5* 8.8* 8.9   CBG:  Recent Labs Lab 11/14/16 1258 11/14/16 1701 11/14/16 2310 11/15/16 0814 11/15/16 1236  GLUCAP 164* 128* 166* 126* 141*   Time spent: 30  minutes  Signed:  Malone Admire  [facility] 11/15/2016, 4:00 PM

## 2016-11-18 ENCOUNTER — Encounter: Payer: Self-pay | Admitting: Pulmonary Disease

## 2016-11-18 ENCOUNTER — Ambulatory Visit (INDEPENDENT_AMBULATORY_CARE_PROVIDER_SITE_OTHER)
Admission: RE | Admit: 2016-11-18 | Discharge: 2016-11-18 | Disposition: A | Discharge status: Home / Self Care | Payer: Medicare Other | Source: Ambulatory Visit | Attending: Pulmonary Disease | Admitting: Pulmonary Disease

## 2016-11-18 ENCOUNTER — Telehealth: Payer: Self-pay | Admitting: Pulmonary Disease

## 2016-11-18 ENCOUNTER — Other Ambulatory Visit (INDEPENDENT_AMBULATORY_CARE_PROVIDER_SITE_OTHER): Payer: Medicare Other

## 2016-11-18 ENCOUNTER — Ambulatory Visit (INDEPENDENT_AMBULATORY_CARE_PROVIDER_SITE_OTHER): Payer: Medicare Other | Admitting: Pulmonary Disease

## 2016-11-18 VITALS — BP 156/88 | HR 84 | Temp 98.2°F | Ht 73.75 in | Wt 214.6 lb

## 2016-11-18 DIAGNOSIS — E782 Mixed hyperlipidemia: Secondary | ICD-10-CM | POA: Diagnosis not present

## 2016-11-18 DIAGNOSIS — I2510 Atherosclerotic heart disease of native coronary artery without angina pectoris: Secondary | ICD-10-CM | POA: Diagnosis not present

## 2016-11-18 DIAGNOSIS — J189 Pneumonia, unspecified organism: Secondary | ICD-10-CM

## 2016-11-18 DIAGNOSIS — L439 Lichen planus, unspecified: Secondary | ICD-10-CM | POA: Diagnosis not present

## 2016-11-18 DIAGNOSIS — J181 Lobar pneumonia, unspecified organism: Secondary | ICD-10-CM | POA: Diagnosis not present

## 2016-11-18 DIAGNOSIS — J449 Chronic obstructive pulmonary disease, unspecified: Secondary | ICD-10-CM

## 2016-11-18 DIAGNOSIS — F411 Generalized anxiety disorder: Secondary | ICD-10-CM

## 2016-11-18 DIAGNOSIS — I10 Essential (primary) hypertension: Secondary | ICD-10-CM

## 2016-11-18 DIAGNOSIS — K3184 Gastroparesis: Secondary | ICD-10-CM

## 2016-11-18 DIAGNOSIS — E119 Type 2 diabetes mellitus without complications: Secondary | ICD-10-CM

## 2016-11-18 LAB — CBC WITH DIFFERENTIAL/PLATELET
BASOS PCT: 0.6 % (ref 0.0–3.0)
Basophils Absolute: 0 10*3/uL (ref 0.0–0.1)
Eosinophils Absolute: 0 10*3/uL (ref 0.0–0.7)
Eosinophils Relative: 0.3 % (ref 0.0–5.0)
HEMATOCRIT: 40.5 % (ref 39.0–52.0)
Hemoglobin: 13.7 g/dL (ref 13.0–17.0)
LYMPHS PCT: 15.1 % (ref 12.0–46.0)
Lymphs Abs: 1.3 10*3/uL (ref 0.7–4.0)
MCHC: 33.8 g/dL (ref 30.0–36.0)
MCV: 92.9 fl (ref 78.0–100.0)
MONOS PCT: 7.2 % (ref 3.0–12.0)
Monocytes Absolute: 0.6 10*3/uL (ref 0.1–1.0)
NEUTROS ABS: 6.5 10*3/uL (ref 1.4–7.7)
Neutrophils Relative %: 76.8 % (ref 43.0–77.0)
PLATELETS: 241 10*3/uL (ref 150.0–400.0)
RBC: 4.36 Mil/uL (ref 4.22–5.81)
RDW: 14.5 % (ref 11.5–15.5)
WBC: 8.5 10*3/uL (ref 4.0–10.5)

## 2016-11-18 LAB — BASIC METABOLIC PANEL
BUN: 11 mg/dL (ref 6–23)
CALCIUM: 10.2 mg/dL (ref 8.4–10.5)
CHLORIDE: 104 meq/L (ref 96–112)
CO2: 28 meq/L (ref 19–32)
Creatinine, Ser: 0.82 mg/dL (ref 0.40–1.50)
GFR: 96.28 mL/min (ref >60.00)
Glucose, Bld: 110 mg/dL — ABNORMAL HIGH (ref 70–99)
Potassium: 4.9 mEq/L (ref 3.5–5.1)
SODIUM: 139 meq/L (ref 135–145)

## 2016-11-18 LAB — SEDIMENTATION RATE: Sed Rate: 48 mm/hr — ABNORMAL HIGH (ref 0–20)

## 2016-11-18 MED ORDER — MAGIC MOUTHWASH: 5.0000 mL | Freq: Four times a day (QID) | ORAL | 0 refills | Status: DC

## 2016-11-18 NOTE — Telephone Encounter (Signed)
Spoke with Estill Bamberg at [facility]. She wanted to clarify that it would be okay to use the [employer] Magic Mouthwash formula. Advised her that this is exactly what we want the pt to have. Nothing further was needed.

## 2016-11-18 NOTE — Patient Instructions (Signed)
Today we updated your med list in our EPIC system...     Finish out the [facility] antibiotic called into your Pharm by the hospitalist...  We wrote for some Magic Mouthwash to use up to 4 times a day as needed for your throat...  Today we checked a follow up CXR & blood work...    We will contact you w/ the results when available...   Rest at home & watch your BP as you are doing...    Gradually increase your BP meds back to the prev doses as we discussed...  Call for any questions...  Let's plan a follow up visit in 3weeks.Marland KitchenMarland Kitchen

## 2016-11-18 NOTE — Progress Notes (Signed)
Subjective:    Patient ID: Jesse Santos, male    DOB: 1935/11/22, 81 y.o.   MRN: [id number]  HPI 81 y/o WM w/ mult med problems here for a follow up visit>  ~  SEE PREV EOPIC NOTES FOR OLDER DATA >>     LABS 1/14:  FLP- at goals on Niacin rx;  Chems- ok w/ BS=134, A1c=7.5;  CBC- wnl;  TSH=1.13;  VitD=42;  PSA=1.10      LABS 7/14:  Chems- ok x BS=140, A1c=7.3...   1/15> Jesse Santos reports that Jesse Santos plans a left THR (for avasc necreosis) whenever he is ready, but he's not rushing it, on Tramadol prn & doing satis for now...   LABS 1/15:  FLP- at goals on diet+Niaspan;  Chesm- ok w/ BS=129, A1c=7.0;  CBC- wnl;  TSH=1.06...  EKG 4/15 showed NSR, rate70, wnl, NAD...  Treadmill 6/15 (Jesse Santos) showed borderline ST segment changes w/ exercise & a run of atrial tachy reported...   Myoview 6/15 showed good exerc capacity, norm BP response, no CP, no evid ischemia, decr uptake inferiorly on rest images similar to 2013, EF=69%, norm wall motion...  LABS 7/15:  Chems- ok w/ BS=126, A1c=7.1.Marland KitchenMarland Kitchen rec to continue Metform500Bid & get on diet, incr exerc, get wt down!!!   ~  October 10, 2014:  82mo ROV & Jesse Santos indicates it took several wks to get over his URI/AB episode in Oct;      On Advair115-2spBid, Mucinex, flonase, claritin; he reports breathing is stable- min cough, no sput, stable DOE, no edema...     He remains on Niasp500/d; FLP 1/16 showed TChol 150, TG 101, HDL 45, LDL 85    He continues Metform500Bid for his DM; Labs 1/16 showed BS=133, A1c=7.2    He has some lower abd pain/ cramping & has used the Levsin w/ relief- c/w IBS, some diarrhea, nausea, groin discomfort; he had colonoscopy 2012 w/ 2 polyps removed & f/u planned 2017...     He sees Jesse Santos for Ortho & takes Tramadol50 aS NEEDED... We reviewed prob list, meds, xrays and labs> see below for updates >>   CXR 1/16 showed norm heart size, clear lungs, DJD in Tspine, NAD...  LABS 1/16:  FLP- at goals on Niasp;  Chems- ok w/ BS=133  A1c=7.2;  CBC- wnl;  TSH=1.59;  PSA=1.25...   ~  April 10, 2015:  48mo ROV & Jesse Santos reports stable w/ CC= arthritis pain in shoulders/ hips, he sees Jesse Santos, told he needs left THR but he is holding off, Tramadol helps;  We reviewed the following medical problems during today's office visit >>    HOH> he has bilat hearing aides...    AR> on allergy shots, OTC antihist, Saline, Flonase; gets occas bouts of sinusitis but overall improved & no recent infections reported...     OSA> Sleep Study was in 2/04 w/ RDI=45 & desat to 88% w/ mod snoring & leg jerks, but he states he never could use the CPAP effectively & states he sleeps fine now if he stays on his side; denies daytime sleepiness or other issues...    COPD> exsmoker quit 1981; on AdvairHFA115-2spBid, Mucinex prn; he doesn't like Pred Rx; he had Bronchitic exac treated 10/15 w/ Levaquin, Medrol, Mucinex, etc & improved; needs incr exercise program...    HBP> on Aten25, Losar100; BP=120/70, he denies CP, palpit, dizzy, SOB, edema, etc...    CAD> on ASA81; followed by Jesse Santos- CT Abd 2010 showed coronary calcif, Treadmill was neg; Myoview 3/13 was neg,  wnl; last seen 4/15- no new symptoms, active w/ yard work, EKG showed wnl, they decided to do Treadmill (borderline ST depression), then Myoview 6/15 showed NEG- no ischemia, decr uptake inferiorly same as 2013, EF=69%, norm wall motion, low risk...    Hyperlipid> on Niacin'500mg'$ ; FLP 1/16 showed TChol 150, TG 101, HDL 45, LDL 85; continue same, get wt down...    DM> on Metform500Bid; wt=222#, BMI=29; Labs 7/16 showed BS=129, A1c=6.7; rec to continue same med, better diet, get wt down!..    GI- GERD, dysphagia, Divertics, IBS, Polyps> on Protonix40, Miralax, Levsin0.125 prn; followed by Jesse Santos & stable- last colon 5/12 w/ severe divertics & 2 sm adenomas removed...    GU- Kid stone, BPH> on Flomax0.4 & Cialis prn; PSA remains wnl (PSA 1/16= 1.25)...    DJD, LBP> left knee complaints w/ shots from Jesse Santos,  similar for left shoulder; hx bilat hip AVN noted on prev CT Abd but he has min complaints and uses Tylenol prn (holding off on surg)...    Anxiety> he does not want anxiolytic meds.[name] EXAM shows Afeb, VSS, O2sat=97%;  Heent- neg, mallampati2;  Chest- clear w/o w/r/r;  Heart- RR w/o m/r/g;  Abd- soft, neg, nontender;  Ext- neg w/o c/c/e;  Neuro- intact...  Labs 7/16>  Chems- ok w/ BS=129, A1c=6.7, Cr=0.83... IMP/PLAN>>  Jesse Santos is stable, meds refilled, he's already had the 2016 Flu vaccine & up to date;  We discussed diet/ exercise/ wt reduction... ROV in 80mo  ~  October 15, 2015:  620moOV & Jesse Santos saw Jesse Santos 07/2015 for a lesion on tongue- sent to Derm Jesse Santos ?lichenoid mucositis (rx w/ several steroid shots & topical meds) & may need referral to Jesse Santos. He has mult somatic complaints> LBP, leg weakness, HAs, cough/ congestion/ clear mucous (yet he states exercise on treadmill for 3028mat 2.6mp29m.. We reviewed the following medical problems during today's office visit >>     HOH> he has bilat hearing aides...    AR> on allergy shots per Jesse Santos, OTC antihist, Saline, Flonase; gets occas bouts of sinusitis but overall improved & no recent infections reported...     OSA> Sleep Study 2/04 w/ RDI=45 & desat to 88% w/ mod snoring & leg jerks, but he states he never could use the CPAP effectively & states he sleeps fine now if he stays on his side; denies daytime sleepiness or other issues...    COPD> exsmoker quit 1981; on AdvairHFA115-2spBid, Mucinex prn; he doesn't like Pred Rx; prev Bronchitic exac treated w/ Levaquin, Medrol, Mucinex & improved; needs incr exercise program...    HBP> on Aten25, Losar100; BP=126/70, he denies CP, palpit, dizzy, SOB, edema, etc...    CAD> on ASA81; followed by Jesse Santos- CT Abd 2010 showed coronary calcif, Treadmill was neg; Myoview 3/13 was neg, wnl; last seen 4/15- no new symptoms, active w/ yard work, EKG showed wnl, they decided to do Treadmill (borderline ST  depression), then Myoview 6/15 showed NEG- no ischemia, decr uptake inferiorly same as 2013, EF=69%, norm wall motion, low risk...    Hyperlipid> on Niacin'500mg'$ ; FLP 1/17 showed TChol 132, TG 52, HDL 46, LDL 75; continue same, get wt down...    DM> on Metform500Bid; wt=220#, BMI=29; Labs 7/16 showed BS=129, A1c=6.7; rec to continue same med, better diet, get wt down; Labs 1/17 showed BS=134, A1c=6.8...  Marland KitchenMarland KitchenGI- GERD, dysphagia, Divertics, IBS, Polyps> on Protonix40, Miralax, Levsin0.125 prn; followed by Jesse Santos & stable- last colon 5/12 w/ severe divertics & 2 sm adenomas  removed...    GU- Kid stone, BPH> on Flomax0.4 & Cialis prn; PSA remains wnl (PSA 1/16= 1.25, 1/17=1.06)...    DJD, LBP> left knee complaints w/ shots from Jesse Santos, similar for left shoulder; hx bilat hip AVN noted on prev CT Abd but he has min complaints and uses Tylenol prn (holding off on surg)...    Anxiety> he does not want anxiolytic meds.[name] EXAM shows Afeb, VSS, O2sat=98%;  Heent- neg, mallampati2;  Chest- clear w/o w/r/r;  Heart- RR w/o m/r/g;  Abd- soft, neg, nontender;  Ext- neg w/o c/c/e;  Neuro- intact...  LABS 10/15/15>  FLP- at goals on diet + niacin;  Chems- ok x BS=134, A1c=6.8 on Metform500bid;  CBC- wnl;  TSH=1.08;  PSA=1.06 IMP/PLAN>>  Jesse Santos has mult somatic complaints but objectively in good shape; we discussed ENT 2nd opinion re tongue lesion vs [facility] referral per Derm, he will decide; continue present Rx & ROV 34mo..  ~  April 13, 2016:  667moOV and after the last visit Jesse Santos saw DrShoemaker, ENT 10/29/15 for the lesion on his tongue- his note is reviewed in Care Everywhere, he concurred w/ topical steroid therapy for this chronic tongue irritation & gave him Triamcinolone cream;  Jesse Santos [facility] 04/01/16 7 note reviewed, bx proven lichenoid dermatitis, exam showed large erosions on lat border of tongue c/w oral erosive lichen planus; He rec Diflucan, Mycelex (for prob candida) &  Tacrolimus (Prograf) to dissolve in water & swish as directed=> pt indicates that he is improved, and they are planning f/u eval... we reviewed the following medical problems during today's office visit >>     HOH> he has bilat hearing aides & has been checked by DrShoemaker.    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    AR> on allergy shots per Jesse Santos, OTC antihist, Saline, Flonase; gets occas bouts of sinusitis but overall improved & no recent infections reported...     OSA> Sleep Study 2/04 w/ RDI=45 & desat to 88% w/ mod snoring & leg jerks, but intol to CPAP & never used; states he sleeps fine now if he stays on his side; denies daytime sleepiness or other issues, naps 1/7...    COPD> exsmoker quit 1981; on AdvairHFA115-2spBid w/ spacer, Mucinex prn; he doesn't like Pred Rx; prev Bronchitic exac treated w/ antibiotics (Levaquin/ Augmentin), Medrol, Mucinex & improved; needs incr exercise program...    HBP> on Aten25, Losar100; BP=142/68, he denies CP, palpit, dizzy, SOB, edema, etc...    CAD> on ASA81; followed by Jesse Santos- seen 12/19/15, CT Abd 2010 showed coronary calcif, Treadmill was neg (borderline ST depression); Myoview 3/13 was neg, wnl & Myoview 6/15 showed NEG- no ischemia, decr uptake inferiorly same as 2013, EF=69%, norm wall motion, low risk... He denies CP, palpit, dizzy, SOB, edema, etc; he remains active w/ yard work etc...    Hyperlipid> on Niacin50026mFLP 1/17 showed TChol 132, TG 52, HDL 46, LDL 75; continue same, get wt down...    DM> on Metform500Bid; wt=218#, BMI=29; Labs 7/16 showed BS=129, A1c=6.7; rec to continue same med, better diet, get wt down; Labs 1/17 showed BS=134, A1c=6.8 and A1c 04/13/16= 6.7, stable...    GI- GERD, dysphagia, Divertics, IBS, Polyps> on Protonix40, Miralax, Levsin0.125 prn; followed by Jesse Santos & stable- last colon 5/12 w/ severe divertics & 2 sm adenomas  removed...    GU- Kid stone, BPH> on Flomax0.4 &  Cialis prn; PSA remains wnl (PSA 1/16= 1.25, 1/17=1.06)...    DJD, LBP> left knee complaints w/ shots from Jesse Santos, similar for left shoulder; hx bilat hip AVN noted on prev CT Abd but he has min complaints and uses Tylenol prn (holding off on surg)...    Anxiety> he does not want anxiolytic meds.[name] EXAM shows Afeb, VSS, O2sat=98%;  Heent- neg, mallampati2;  Chest- clear w/o w/r/r;  Heart- RR w/o m/r/g;  Abd- soft, neg, nontender;  Ext- neg w/o c/c/e;  Neuro- intact...  LABS 04/13/16:  BMet- ordered but not done;  A1c=6.7.Marland KitchenMarland Kitchen IMP/PLAN>>  Jesse Santos is stable w/ his mult medical issues as above- oral lichen planus improved on regimen from Derm at [facility]; BP controlled, he remains active w/o angina, FLP & DM well regualted=> continue same meds.  ~  October 15, 2016:  41moROV & Jesse Santos was seen by SG 07/2016 & Jesse Santos 08/2016 w/ cough prod of green mucus, sinus congestion, but no f/c/s; he was placed on MTX by DrJorizzo, [facility] due to his tongue lesion felt to be oral lichen planus, 2=> incr to 3 tabs once per week along w/ Folate, Mycelex and Prograf (1 cap dissolved in water & swished);  He remains on AdvairHFA-2spBid & he's careful to rinse after these treatments too;  He was treated w/ Levaquin + Pred taper and he followed up w/ Jesse Santos 09/08/16-- improved w/ decr cough & dyspnea, no wheezing, remains on Advair, flonase, claritin, mucinex... we reviewed the following medical problems during today's office visit >>     HOH> he has bilat hearing aides & has been checked by DrShoemaker.    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    AR> on allergy shots per Jesse Santos, OTC antihist, Saline, Flonase; gets occas bouts of sinusitis but overall improved & no recent infections reported...     OSA> Sleep Study 2/04 w/ RDI=45 & desat to 88% w/ mod  snoring & leg jerks, but intol to CPAP & never used; states he sleeps fine now if he stays on his side; denies daytime sleepiness or other issues, naps 1/7...    COPD> exsmoker quit 1981; on AdvairHFA115-2spBid w/ spacer, Mucinex prn; he doesn't like Pred Rx; prev Bronchitic exac treated w/ antibiotics (Levaquin/ Augmentin), Medrol, Mucinex & improved; needs incr exercise program...    HBP> on Aten25, Losar100; BP=116/60, he denies CP, palpit, dizzy, SOB, edema, etc...    CAD> on ASA81; followed by Jesse Santos- seen 12/19/15, CT Abd 2010 showed coronary calcif, Treadmill was neg (borderline ST depression); Myoview 3/13 was neg, wnl & Myoview 6/15 showed NEG- no ischemia, decr uptake inferiorly same as 2013, EF=69%, norm wall motion, low risk... He denies CP, palpit, dizzy, SOB, edema, etc; he remains active w/ yard work etc...    Hyperlipid> on Niacin5039m FLP 1/17 showed TChol 132, TG 52, HDL 46, LDL 75; continue same, get wt down...    DM> on Metform500Bid; wt=216#, BMI=29; Labs 1/17 showed BS=134, A1c=6.8; rec to continue same med, better diet, get wt down; Labs 2/18 showed BS=136, A1c=6.7; stable...    GI- GERD, dysphagia, Divertics, IBS, Polyps> on Protonix40, Miralax, Levsin0.125 prn; followed by Jesse Santos & stable- last colon 5/12 w/ severe divertics & 2 sm adenomas removed; he wants off PPI=> change to PEPCID40...    GU- Kid stone, BPH> on Flomax0.4 & Cialis prn; PSA remains wnl (PSA 1/16= 1.25, 1/17=1.06, 2/18=1.45)...Marland KitchenMarland Kitchen  DJD, LBP> left knee complaints w/ shots from Jesse Santos, similar for left shoulder; hx bilat hip AVN noted on prev CT Abd but he has min complaints and uses Tylenol prn (holding off on surg)...    Anxiety> he does not want anxiolytic meds.[name] EXAM shows Afeb, VSS, O2sat=99%;  Wt=216#, 6'2"Tall, BMI=28;  Heent- neg, mallampati2;  Chest- clear w/o w/r/r;  Heart- RR w/o m/r/g;  Abd- soft, neg, nontender;  Ext- neg w/o c/c/e;  Neuro- intact...  CXR 08/10/16>  Norm heart size, atherosclerotic  Ao, clear lungs- NAD, degen changes in Tspine w/ some scoliosis...  LABS 10/15/16>  FLP- at goals on Niaspan;  Chems- ok w/ BS=136, A1c=6.7;  CBC- wnl w/ Hg=13.9, Fe=169 (42%sat), B12=504;  TSH=1.04,  PSA=1.45...  IMP/PLAN>>  Jesse Santos is still dealing w/ his oral prob= Lichen Planus & improved w/ treatment program under the direction of [facility] at [facility]- on MTX 3/wk, Prograf, Mycelex;  His wife is concerned about his being on Protonix40 for many yrs, followed by Jesse Santos w/ GERD/ dysphagia, last EGD 2009 was essentially normal- OK to stop the PPI & change to Pepcid40;  Continue other meds the same + diet/ exercise...    ~  November 18, 2016:  74mo ROV & post hospital visit> Jesse Santos was hospitalized 3/1 - 11/15/16 by [facility] w/ multilobar community acquired pneumonia (predom lingular w/ CT abn in LLL & RLL as well);  He woke w/ cough, malaise, myalgias, & chills;  Temp in ER was 103, BP was wnl, O2sat was 945 on 2L/min, CXR showed LLL pneumonia, no organism specified, treated w/ rocephin & zithromax=> disch on oral zithromax & medrol taper...     Currently feeling better- sl sore throat, cough, sm amt beige sput, no hemoptysis, +SOB/DOE & still "weak", some chest discomfort=> we decided to f/u CXR/ Labs (see below)...    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    AR> on allergy shots per Jesse Santos, OTC antihist, Saline, Flonase; gets occas bouts of sinusitis but overall improved & no recent infections reported...     OSA> Sleep Study 2/04 w/ RDI=45 & desat to 88% w/ mod snoring & leg jerks, but intol to CPAP & never used; states he sleeps fine now if he stays on his side; denies daytime sleepiness or other issues, naps 1/7...    COPD> exsmoker quit 1981; on AdvairHFA115-2spBid w/ spacer, Mucinex prn; he doesn't like Pred Rx; prev Bronchitic exac treated w/ antibiotics (Levaquin/  Augmentin), Medrol, Mucinex & improved; needs incr exercise program... EXAM shows Afeb, VSS, O2sat=99%;  Wt=216#, 6'2"Tall, BMI=28;  Heent- neg, mallampati2;  Chest- clear w/o w/r/r;  Heart- RR w/o m/r/g;  Abd- soft, neg, nontender;  Ext- neg w/o c/c/e;  Neuro- intact...  CXR 11/12/16 showed LLL pneumonia, borderline cardiomegaly, aortic calcif  CT Chest 11/13/16>  Neg for PE, multilobar left lung pneumonia, poss early RLL infiltrate, calcif aortic & coronary arteries...  CXR today 11/18/16 showed norm heart size, aortic atherosclerosis, left lung opacity is resolving/ improved, no effusion etc...  LABS 11/2016>  Chems- ok x BS 110-190;  CBC- ok x WBC 14K=>9K  LABS 11/18/16>  Chems- ok w/ BS=110;  CBC- improved w/ Hg=13.7, WBC=8.5 IMP/PLAN>>  Jesse Santos has improved from his recent bout of pneumonia (no organism identified); he has finished the antibiotics (Zithromax), & weaning off the Medrol;  We plan ROV recheck in 3-4weeks.Marland KitchenMarland Kitchen  Problem List:  HEARING LOSS (ICD-389.9) - he has bilat hearing aides...  ALLERGIC RHINITIS (ICD-477.9) - on allergy shots from DrESL Gaetano Hawthorne)... plus Claritin, Saline, Mucinex, Flonase. ~  occas bouts of sinusitis requiring antibiotic Rx... ~  8/13: he had allergy f/u Jesse Santos> skin testing 2010 pos to dust mites and molds; doing well on shots once/mo; stable & no changes made... ~  3/14: he presented w/ an upper resp infection & treated w/ Depo, Dosepak, Zithromax... ~  8/14:  He had allergy f/u Jesse Santos> Advair, Flonase, Saline, Mucinex; he checked FENO- reported normal... ~  7/15: stable on allergy meds from Jesse Santos, plus AdvairHFA115; he had sinus/ AB exac 5/15 treated by Jesse Santos w/ ZPak=> Levaquin & resolved...  OBSTRUCTIVE SLEEP APNEA (ICD-327.23) - sleep study 8/04 showed RDI= 45 & desat to 88%... mod snoring and leg jerks without sleep disruption... CPAP perscribed but not using it now- states he can't sleep w/ it on & furthermore he is resting well as long  as he stays on his side... denies daytime hypersomnolence & not interested in re-evaluation.  COPD (ICD-496) - ex-smoker, quit 1981 >>  ~  on ADVAIR 100Bid, PROAIR Prn, MUCINEX Bid... he had Pneumovax in 2008... doesn't like Pred Rx...  ~  10/11:  doing well except recent cough, min green sputum (no change in dyspnea)- we will Rx w/ Augmentin, incr Mucinex/ Fluids. ~  4/12:  Breathing at baseline, continue maintenance meds... ~  CXR 12/13 showed normal heart size, clear lungs, NAD... ~  1/14:  He's had 2 exac this past yr- saw Jesse Santos w/ Rx & improved... ~  1/15:  He continues to do well on Advair + allergy Rx from Jesse Santos w/ Flonase, Saline, Mucinex... ~  7/15: exsmoker quit 1981; on AdvairHFA115-2spBid, Mucinex prn; he doesn't like Pred Rx; he had Bronchitic exac treated by Jesse Santos 5/15 w/ ZPak, then Levaquin, Mucinex, etc & improved; needs incr exercise program... ~  10/15: seen w/ refractory bronchitic episode reqiring ZPak=>Levaquin, Medrol, Mucinex, Hydromet, etc... ~  1/16: He had a URI/ AB exac treated 10/15 w/ Levaquin/ Medrol/ Mucinex & resolved; now at baseline on Advair115-2spBid etc... ~  11/2016> he developed a LLL pneumonia (nos) & resolved w/ Rocephin/ Zithromax, Medrol, Nebs, etc...  HYPERTENSION (ICD-401.9) - prev on diet alone> then on Diovan320, but we decided to change to LOSARTAN 100mg /d 5/13 to save $$ ~  He claims that Norvasc & HCTZ "stopped my urine flow"... ~  10/11:  BP= 128/80 & even better at home he says... denies HA, fatigue, visual changes, CP, palipit, dizziness, syncope, dyspnea, edema, etc... ~  4/12:  Pt has seen Jesse Santos, Jesse Santos, & checking BP daily at home; tried on Norvasc but claims it decr his urine flow; now on Diovan & improved w/ BP= 132/70... ~  5/12:  BP remains well controlled on Diovan 320mg /d= 120/62 today. ~  11/12:  BP= 142/82 on Diovan160 now; denies CP, palpit, dizzy, SOB, edema... ~  5/13:  BP= 128/66 & he's requesting change to cheaper med; rec  switch Diovan to LOSARTAN 100mg /d... ~  1/14: on Losar100; BP=140/84, he notes incr HR he says & we decided to add low dose ATENOLOL25mg /d... ~  7/14: on Aten25, Losar100> BP=128/72 & he denies CP, palpit, SOB, edema, etc... ~  1/15: BP controlled on Aten25, Losar100 w/ BP= 130/80 & he denies CP, palpit, SOB, edema. ~  7/15: on Aten25, Losar100; BP=124/70, he denies CP, palpit, dizzy, SOB, edema, etc. ~  10/15: on Aten25, Losar100; BP= 140/60 &  remains stable hemodynamically... ~  BP remains under good control on same meds...  R/O CAD (ICD-414.00) - on ASA 81mg /d... Followed by Jesse Santos & his notes are reviewed. ~  [facility]... ~  NuclearStressTest 10/02 was norm- no scar or ischemia, EF=55%. ~  CT Abd 4/10 via ER showed coronary calcif... referred to Cards. ~  Eval by Jesse Santos 6/10 showed norm EKG & cardiac exam... treadmill ordered. ~  Treadmill 6/10 showed reasonable exerc tolerance, no ischemic changes, few PVCs, sl incr BP... ~  EKG 3/13 showed NSR, rate76, WNL, NAD... ~  Myoview 3/13 was neg> exercised for 32min, stopped for fatigue & CP; no ST seg changes, occas PACs & PVCs; hypertensive BP response; no ischemia, EF=71%, normal wall motion... ~  He saw Jesse Santos 4/14> hx CAD, doing satis on ASA & BP rx w/o angina & rec to follow program of aggressive risk factor reduction, no change in meds.  ~  EKG 4/14 showed NSR, rate60, wnl, NAD.. ~  6/15: .on ASA81; followed by Jesse Santos- last seen 4/15- no new symptoms, active w/ yard work, EKG showed wnl, they decided to do Treadmill (borderline ST depression), then Myoview 6/15 showed NEG- no ischemia, decr uptake inferiorly same as 2013, EF=69%, norm wall motion, low risk...  MIXED HYPERLIPIDEMIA (ICD-272.2) - low HDL and NIASPAN 500mg /d started 4/09... ~  Floyd 4/09 showed TChol 160, TG 106, HDL 26, LDL 113... rec- diet + Niaspan 500/d. ~  FLP 10/09 showed TChol 155, TG 78, HDL 33, LDL 106... ~  FLP 4/10  showed TChol 133, TG 63, HDL 30, LDL 91... rec> continue same. ~  FLP 4/11 showed TChol 144, TG 61, HDL 38, LDL 94 ~  FLP 10/11 showed TChol 155, TG 81, HDL 40, LDL 99 ~  FLP 4/12 showed TChol 142, TG 69, HDL 38, LDL 90 ~  FLP 11/12 showed TChol 165, TG 82, HDL 43, LDL 106 ~  FLP 5/13 in Niasp500 showed TChol 155, TG 72, HDL 46, LDL 95... rec change to generic. ~  FLP 1/14 on Niacin500 showed TChol 140, TG 56, HDL 39, LDL 90 ~  FLP 1/15 on Niaspan500 showed TChol 148, TG 57, HDL 43, LDL 94  ~  FLP 1/16 on Niaspan500 showed TChol 150, TG 101, HDL 45, LDL 85 ~  FLP 1/17 on Niaspan500 showed TChol 132, TG 52, HDL 46, LDL 75  DM (ICD-250.00) - on diet + METFORMIN 500mg Bid... he reports BS at home all in the 120-130's... ~  labs 4/09 showed BS= 132, HgA1c= 6.6.Marland KitchenMarland Kitchen rec- same med, better diet... ~  labs 10/09 showed BS= 131, HgA1c= 6.3.Marland KitchenMarland Kitchen ~  labs 4/10 showed BS= 117, HgA1c= 6.2.[name]. rec> same meds/ diet Rx. ~  labs 10/10 showed BS= 138, A1c= 6.5 ~  labs 4/11 showed BS= 121, A1c= 6.5.Marland KitchenMarland Kitchen On Metform500Bid + diet. ~  Dilated eye exam 5/11 by DrShapiro- no retinopathy... ~  labs 10/11 (wt=228#) showed BS= 118, A1c= 7.1.[name]. not as good- get on diet or more meds. ~  Labs 4/12 (wt=229#) showed BS= 119, A1c= 7.3... Wrong direction, may need more meds, get wt down! ~  5/12:  Ophthalmology check by DrShapiro was neg- no retinopathy... ~  Labs 11/12 showed BS= 136, A1c= 7.0 ~  Labs 5/13 on MetformBid showed BS= 132, A1c= 7.1.Marland KitchenMarland Kitchen Continue same, get wt down. ~  Ophthalmology check from DrShapiro 5/13> no DM retinopathy... ~  Labs 1/14 showed BS= 134, A1c= 7.5; not as good- needs better  diet, get wt down, same meds for now... ~  Labs 7/14 on Metform500Bid showed BS= 140, A1c= 7.3  ~  Labs 1/154 on Metform500Bid showed BS= 129 & A1c= 7.0, and we reviewed meds/ diet/ exercise/ weight... ~  Labs 7/15 on Metform500Bid showed BS= 126, A1c= 7.1 & reminded of need for diet/ exerc/ wt reduction... ~  Labs 1/16 on  Metform500Bid showed BS= 133, A1c= 7.2 ~  Labs 1/17 on Metform500Bid showed BS= 134, A1c= 6.8  GERD (ICD-530.81) & ? GASTROPARESIS - on PROTONIX 40mg /d, & off Reglan per Jesse Santos... ~   last EGD 6/07 by Jesse Santos showed gastic polyp... ~  8/10:  given Compazine suppos for gastroparesis flair by DrGessner. ~  4/12:  C/o intermittent dysphagia & referred back to GI for further eval (colonoscopy due as well)...  DIVERTICULOSIS OF COLON (ICD-562.10) - he uses MIRALAX Bid regularly... IRRITABLE BOWEL SYNDROME (ICD-564.1) COLONIC POLYPS (ICD-211.3) ~  last colonoscopy 5/07 by DrSam showed divertics only... f/u 65yrs. ~  CT Abd 4/10 in ER showed atx bases, coronary calcif, s/p GB, abdAo calcif, 28mm renal stone, divertics, bilat hip AVN. ~  Follow up colonoscopy by Jesse Santos 5/12 showed severe diverticulosis, 2 sm polyps= tubular adenoma & f/u planned 5 yrs...  RENAL CALCULUS, HX OF (ICD-V13.01) BENIGN PROSTATIC HYPERTROPHY, HX OF (ICD-V13.8) - on FLOMAX 0.4mg /d w/ improved symptoms (he states flow better since stopping Vit E supplement). ~  4/12:  Routine PSA= 4.9 (it was 1.02 4/11) & we rx w/ Doxy Bid x14d, thewn plan recheck PSA==> 1.30  DEGENERATIVE JOINT DISEASE (ICD-715.90) - pt states "I have a bum knee" and eval by Jesse Santos w/ viscosupplementation shots... Note: Abd CT w/ bilat hip AVN noted... takes TYLENOL Prn... ~  11/10:  s/p left knee arthroscopy by Jesse Santos... ~  10/11:  s/p fall w/ left knee injury- s/p cortisone shot by Jesse Santos; hx shot in left shoulder too. ~  7/14: he is c/o incr pain in left hip area; known hx AVN & needs f/u by Ortho...  BACK PAIN, LUMBAR (ICD-724.2)  ANXIETY (ICD-300.00) - not currently on meds for nerves.  ANEMIA (ICD-285.9) - prev hx of GIB... ~  labs 4/09 showed Hg= 14.1 ~  labs 4/10 showed Hg= 13.6 ~  labs 4/11 showed Hg= 13.4 ~  Labs 4/12 showed Hg= 13.8 ~  Labs 3/13 showed Hg= 13.1 ~  Labs 1/14 showed Hg= 13.6 ~  Labs 1/15 showed Hg= 13.7 ~  Labs 1/16  showed Hg= 14.1 ~  CBC remains WNL...  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  Health Maintenance: ~  GI: followed by Jesse Santos> Colonoscopy 5/07 by DrSam... f/u planned 5 yrs. ~  GU:  See PSAs recorded above... ~  Immunizations: Pneumovax in 2008 @ age 65;  [place] given 7/15; Tetanus- given 4/10;  he gets yearly seasonal Flu vaccine.   Past Surgical History:  Procedure Laterality Date  . CATARACT EXTRACTION    . CHOLECYSTECTOMY    . KNEE SURGERY  11/10  . SKIN CANCER EXCISION  3/10   left hand     Outpatient Encounter Prescriptions as of 11/18/2016  Medication Sig Dispense Refill  . acetaminophen (TYLENOL) 500 MG tablet Take 500 mg by mouth every 6 (six) hours as needed for headache (pain).     [name] aspirin EC 81 MG tablet Take 81 mg by mouth at bedtime.    [name] azithromycin (ZITHROMAX) 500 MG tablet Take 1 tablet (500 mg total) by mouth daily. 2 tablet  0  . Cholecalciferol (VITAMIN D) 2000 UNITS CAPS Take 2,000 Units by mouth daily.     . clotrimazole (MYCELEX) 10 MG troche Take 10 mg by mouth daily.     . COMPRO 25 MG suppository PLACE 1 SUPPOSITORY (25 MG TOTAL) RECTALLY EVERY 12 (TWELVE) HOURS AS NEEDED FOR NAUSEA. 12 suppository 0  . dextromethorphan-guaiFENesin (MUCINEX DM) 30-600 MG per 12 hr tablet Take 1 tablet by mouth every 12 (twelve) hours.    . famotidine (PEPCID) 40 MG tablet Take 1 tablet (40 mg total) by mouth daily. (Patient taking differently: Take 40 mg by mouth at bedtime. ) 90 tablet 3  . fluticasone (FLONASE) 50 MCG/ACT nasal spray Place 2 sprays into both nostrils daily. (Patient taking differently: Place 2 sprays into both nostrils at bedtime. ) 16 g 11  . fluticasone-salmeterol (ADVAIR HFA) 115-21 MCG/ACT inhaler Inhale 1 puff into the lungs 2 (two) times daily. 1 Inhaler 11  . folic acid (FOLVITE) 1 MG tablet Take 1 mg by mouth daily with lunch.     [name] glucose blood (ONE TOUCH ULTRA TEST) test strip TEST BLOOD SUGAR ONCE DAILY AS DIRECTED 100 each 4   . hyoscyamine (LEVSIN SL) 0.125 MG SL tablet Place 1 tablet (0.125 mg total) under the tongue every 4 (four) hours as needed. As needed for abdominal cramping (Patient taking differently: Place 0.125 mg under the tongue every 4 (four) hours as needed (abdominal cramping). ) 30 tablet 5  . lidocaine (ASPERCREME W/LIDOCAINE) 4 % cream Apply 1 application topically daily as needed (pain).     [name] loratadine (CLARITIN) 10 MG tablet Take 10 mg by mouth daily as needed for allergies.    [name] losartan (COZAAR) 25 MG tablet Take 1 tablet (25 mg total) by mouth daily. 30 tablet 0  . metFORMIN (GLUCOPHAGE) 500 MG tablet Take 1 tablet (500 mg total) by mouth 2 (two) times daily with a meal.    . methotrexate (RHEUMATREX) 2.5 MG tablet Take 3 tablets (7.5 mg total) by mouth See admin instructions. Take 3 tablets (7.5 mg) by mouth on Tuesdays with lunch, starting 11/24/2016 4 tablet 0  . methylPREDNISolone (MEDROL) 4 MG tablet Take 1tab twice a day for 1day, Take 1tab three times a day for 1day, Take 1tab twice a day for 1day, Take 1tab a day for 1day, and stop. 10 tablet 0  . Multiple Vitamins-Minerals (MULTIVITAMIN WITH MINERALS) tablet Take 1 tablet by mouth daily.      . niacin (NIASPAN) 500 MG CR tablet Take 1 tablet (500 mg total) by mouth at bedtime. 90 tablet 3  . nitroGLYCERIN (NITROSTAT) 0.4 MG SL tablet Place 1 tablet (0.4 mg total) under the tongue every 5 (five) minutes as needed for chest pain. 30 tablet 11  . polyethylene glycol powder (GLYCOLAX/MIRALAX) powder TAKE 17 GRAMS BY MOUTH TWICE DAILY AS NEEDED 1054 g 5  . Probiotic Product (PROBIOTIC PO) Take 1 tablet by mouth daily.    . sodium chloride (OCEAN) 0.65 % nasal spray Place 1 spray into the nose 3 (three) times daily as needed for congestion.     . tacrolimus (PROGRAF) 1 MG capsule Take 1 mg by mouth See admin instructions. Dissolve 1 capsule (1 mg) in 500 ml of water and keep in refrigerator - swish and spit small amount twice daily    .  tamsulosin (FLOMAX) 0.4 MG CAPS capsule Take 1 capsule (0.4 mg total) by mouth 2 (two) times daily. 180 capsule 3  . traMADol (ULTRAM)  50 MG tablet TAKE 1 TABLET THREE TIMES A DAY AS NEEDED FOR PAIN (Patient taking differently: Take 50 mg by mouth 3 (three) times daily as needed (pain). ) 90 tablet 5  . magic mouthwash SOLN Take 5 mLs by mouth 4 (four) times daily. 120 mL 0   No facility-administered encounter medications on file as of 11/18/2016.     Allergies  Allergen Reactions  . Prednisone Other (See Comments)    REACTION: high dose intolerance - causes numbness from waist down - low dose tapered course ok    Immunization History  Administered Date(s) Administered  . H1N1 08/21/2008  . Influenza Split 06/01/2011, 05/26/2012  . Influenza Whole 06/06/2008, 06/21/2009, 06/06/2010  . Influenza, High Dose Seasonal PF 07/03/2016  . Influenza,inj,Quad PF,36+ Mos 06/07/2013, 07/09/2014, 06/10/2015  . Pneumococcal Conjugate-13 04/05/2014  . Pneumococcal Polysaccharide-23 06/30/2006  . Zoster 09/14/2010    Current Medications, Allergies, Past Medical History, Past Surgical History, Family History, and Social History were reviewed in [employer] record.    Review of Systems         See HPI - all other systems neg except as noted... The patient complains of decreased hearing, dyspnea on exertion, headaches, and difficulty walking.  The patient denies anorexia, fever, weight loss, weight gain, vision loss, hoarseness, chest pain, syncope, peripheral edema, prolonged cough, hemoptysis, abdominal pain, melena, hematochezia, severe indigestion/heartburn, hematuria, incontinence, muscle weakness, suspicious skin lesions, transient blindness, depression, unusual weight change, abnormal bleeding, enlarged lymph nodes, and angioedema.     Objective:   Physical Exam      WD, WN, 81 y/o WM in NAD... GENERAL:  Alert & oriented; pleasant & cooperative... HEENT:  [place]/AT,  EOM-full, PERRLA, EACs-clear, TMs-wnl, NOSE- pale, congested, THROAT-clear, but lichenoid lesion on lat tongue. NECK:  Supple w/ fairROM; no JVD; normal carotid impulses w/o bruits; no thyromegaly or nodules palpated; no lymphadenopathy. CHEST:  Clear to P & A w/o wheezing, rhonchi, rales, or signs of consolidation... HEART:  Regular Rhythm; without murmurs/ rubs/ or gallops detected...  ABDOMEN:  Soft & nontender; normal bowel sounds; no organomegaly or masses palpated., no guarding, or rebound EXT: without deformities, mild arthritic changes; no varicose veins/ venous insuffic/ or edema. NEURO:  CN's intact;  no focal neuro deficits... DERM:  No lesions noted; no rash etc...  RADIOLOGY DATA:  Reviewed in the EPIC EMR & discussed w/ the patient...  LABORATORY DATA:  Reviewed in the EPIC EMR & discussed w/ the patient...   Assessment & Plan:    Lesion on tongue>. LICHEN PLANUS and he is improved on Rx from [facility] (MTX, Mycelex, & Prograf=> dissolve in water/ swish/ & spit)...  Hx Acute Bronchitis> presented 10/15 w/ refractory bronchitic episode; given ZPak=>Levaquin, Depo80/ Medrol dosepak, Mucinex, Hydromet, etc=> resolved... COPD exac>  He continues on Advair, Proair rescue, Mucinex; also Claritin, Flonase, Saline; stable continue same Rx... LLL Pneumonia 11/2016>  [facility]osp by [facility] & treated w/ Rocephin/Zithromax, Medrol taper, NEBS, etc 7 improved...  HBP>  On LOSARTAN 100mg /d & [id number] w/ good control of BP, continue same...  R/O CAD>  Followed by Jesse Santos, and doing satis w/ neg Myoview 6/15... continue ASA & risk factor reduction....  Lipids>  Looks satis on his diet + Niacin...  DM>  Control is fair w/ A1c stable at 6.7 he was told there are more meds in his future if he doesn't get his wt down (currently~225#)...  GI>  Per Jesse Santos w/ GERD, ?Gastroparesis, Divertics, IBS, Colon polyps> f/u  colon 5/12 w/ divertics & 2 sm adenomas, repeat planned ~42yrs...  ORTHO>  Per  Jesse Santos w/ avasc necrosis in left hip & they are watching it for future surg...  Anxiety>  Certainly an issue, he does not want anxiolytic Rx...   Patient's Medications  New Prescriptions   MAGIC MOUTHWASH SOLN    Take 5 mLs by mouth 4 (four) times daily.  Previous Medications   ACETAMINOPHEN (TYLENOL) 500 MG TABLET    Take 500 mg by mouth every 6 (six) hours as needed for headache (pain).    ASPIRIN EC 81 MG TABLET    Take 81 mg by mouth at bedtime.   AZITHROMYCIN (ZITHROMAX) 500 MG TABLET    Take 1 tablet (500 mg total) by mouth daily.   CHOLECALCIFEROL (VITAMIN D) 2000 UNITS CAPS    Take 2,000 Units by mouth daily.    CLOTRIMAZOLE (MYCELEX) 10 MG TROCHE    Take 10 mg by mouth daily.    COMPRO 25 MG SUPPOSITORY    PLACE 1 SUPPOSITORY (25 MG TOTAL) RECTALLY EVERY 12 (TWELVE) HOURS AS NEEDED FOR NAUSEA.   DEXTROMETHORPHAN-GUAIFENESIN (MUCINEX DM) 30-600 MG PER 12 HR TABLET    Take 1 tablet by mouth every 12 (twelve) hours.   FAMOTIDINE (PEPCID) 40 MG TABLET    Take 1 tablet (40 mg total) by mouth daily.   FLUTICASONE (FLONASE) 50 MCG/ACT NASAL SPRAY    Place 2 sprays into both nostrils daily.   FLUTICASONE-SALMETEROL (ADVAIR HFA) 115-21 MCG/ACT INHALER    Inhale 1 puff into the lungs 2 (two) times daily.   FOLIC ACID (FOLVITE) 1 MG TABLET    Take 1 mg by mouth daily with lunch.    GLUCOSE BLOOD (ONE TOUCH ULTRA TEST) TEST STRIP    TEST BLOOD SUGAR ONCE DAILY AS DIRECTED   HYOSCYAMINE (LEVSIN SL) 0.125 MG SL TABLET    Place 1 tablet (0.125 mg total) under the tongue every 4 (four) hours as needed. As needed for abdominal cramping   LIDOCAINE (ASPERCREME W/LIDOCAINE) 4 % CREAM    Apply 1 application topically daily as needed (pain).    LORATADINE (CLARITIN) 10 MG TABLET    Take 10 mg by mouth daily as needed for allergies.   LOSARTAN (COZAAR) 25 MG TABLET    Take 1 tablet (25 mg total) by mouth daily.   METFORMIN (GLUCOPHAGE) 500 MG TABLET    Take 1 tablet (500 mg total) by mouth 2 (two) times  daily with a meal.   METHOTREXATE (RHEUMATREX) 2.5 MG TABLET    Take 3 tablets (7.5 mg total) by mouth See admin instructions. Take 3 tablets (7.5 mg) by mouth on Tuesdays with lunch, starting 11/24/2016   METHYLPREDNISOLONE (MEDROL) 4 MG TABLET    Take 1tab twice a day for 1day, Take 1tab three times a day for 1day, Take 1tab twice a day for 1day, Take 1tab a day for 1day, and stop.   MULTIPLE VITAMINS-MINERALS (MULTIVITAMIN WITH MINERALS) TABLET    Take 1 tablet by mouth daily.     NIACIN (NIASPAN) 500 MG CR TABLET    Take 1 tablet (500 mg total) by mouth at bedtime.   NITROGLYCERIN (NITROSTAT) 0.4 MG SL TABLET    Place 1 tablet (0.4 mg total) under the tongue every 5 (five) minutes as needed for chest pain.   POLYETHYLENE GLYCOL POWDER (GLYCOLAX/MIRALAX) POWDER    TAKE 17 GRAMS BY MOUTH TWICE DAILY AS NEEDED   PROBIOTIC PRODUCT (PROBIOTIC PO)    Take  1 tablet by mouth daily.   SODIUM CHLORIDE (OCEAN) 0.65 % NASAL SPRAY    Place 1 spray into the nose 3 (three) times daily as needed for congestion.    TACROLIMUS (PROGRAF) 1 MG CAPSULE    Take 1 mg by mouth See admin instructions. Dissolve 1 capsule (1 mg) in 500 ml of water and keep in refrigerator - swish and spit small amount twice daily   TAMSULOSIN (FLOMAX) 0.4 MG CAPS CAPSULE    Take 1 capsule (0.4 mg total) by mouth 2 (two) times daily.   TRAMADOL (ULTRAM) 50 MG TABLET    TAKE 1 TABLET THREE TIMES A DAY AS NEEDED FOR PAIN  Modified Medications   No medications on file  Discontinued Medications   No medications on file

## 2016-11-25 ENCOUNTER — Inpatient Hospital Stay: Payer: Medicare Other | Admitting: Pulmonary Disease

## 2016-12-09 ENCOUNTER — Ambulatory Visit (INDEPENDENT_AMBULATORY_CARE_PROVIDER_SITE_OTHER): Payer: Medicare Other | Admitting: Pulmonary Disease

## 2016-12-09 ENCOUNTER — Ambulatory Visit (INDEPENDENT_AMBULATORY_CARE_PROVIDER_SITE_OTHER)
Admission: RE | Admit: 2016-12-09 | Discharge: 2016-12-09 | Disposition: A | Discharge status: Home / Self Care | Payer: Medicare Other | Source: Ambulatory Visit | Attending: Pulmonary Disease | Admitting: Pulmonary Disease

## 2016-12-09 VITALS — BP 122/70 | HR 69 | Temp 97.5°F | Ht 73.75 in | Wt 211.1 lb

## 2016-12-09 DIAGNOSIS — I10 Essential (primary) hypertension: Secondary | ICD-10-CM | POA: Diagnosis not present

## 2016-12-09 DIAGNOSIS — E782 Mixed hyperlipidemia: Secondary | ICD-10-CM

## 2016-12-09 DIAGNOSIS — J189 Pneumonia, unspecified organism: Secondary | ICD-10-CM

## 2016-12-09 DIAGNOSIS — J181 Lobar pneumonia, unspecified organism: Secondary | ICD-10-CM

## 2016-12-09 DIAGNOSIS — K3184 Gastroparesis: Secondary | ICD-10-CM

## 2016-12-09 DIAGNOSIS — E119 Type 2 diabetes mellitus without complications: Secondary | ICD-10-CM

## 2016-12-09 DIAGNOSIS — J449 Chronic obstructive pulmonary disease, unspecified: Secondary | ICD-10-CM | POA: Diagnosis not present

## 2016-12-09 DIAGNOSIS — Z87891 Personal history of nicotine dependence: Secondary | ICD-10-CM | POA: Diagnosis not present

## 2016-12-09 DIAGNOSIS — I2510 Atherosclerotic heart disease of native coronary artery without angina pectoris: Secondary | ICD-10-CM

## 2016-12-09 DIAGNOSIS — L439 Lichen planus, unspecified: Secondary | ICD-10-CM

## 2016-12-09 DIAGNOSIS — F411 Generalized anxiety disorder: Secondary | ICD-10-CM

## 2016-12-09 NOTE — Patient Instructions (Signed)
Today we updated your med list in our EPIC system...    Continue your current medications the same...  Today we did a follow up CXR...    We will contact you w/ the results when available...   Continue your same meds, diet & exercise program...  Enjoy the coming spring weather...  Call for any questions...  Let's plan a follow up visit in 3-64mo, sooner if needed for problems.Marland KitchenMarland Kitchen

## 2016-12-13 ENCOUNTER — Encounter: Payer: Self-pay | Admitting: Pulmonary Disease

## 2016-12-13 NOTE — Progress Notes (Signed)
Subjective:    Patient ID: Jesse Santos, male    DOB: [birth year], 81 y.o.   MRN: [id number]  HPI 81 y/o WM w/ mult med problems here for a follow up visit>  ~  SEE PREV EOPIC NOTES FOR OLDER DATA >>     LABS 1/14:  FLP- at goals on Niacin rx;  Chems- ok w/ BS=134, A1c=7.5;  CBC- wnl;  TSH=1.13;  VitD=42;  PSA=1.10      LABS 7/14:  Chems- ok x BS=140, A1c=7.3...   1/15> Jesse Santos reports that Jesse Santos plans a left THR (for avasc necreosis) whenever he is ready, but he's not rushing it, on Tramadol prn & doing satis for now...   LABS 1/15:  FLP- at goals on diet+Niaspan;  Chesm- ok w/ BS=129, A1c=7.0;  CBC- wnl;  TSH=1.06...  EKG 4/15 showed NSR, rate70, wnl, NAD...  Treadmill 6/15 (Jesse Santos) showed borderline ST segment changes w/ exercise & a run of atrial tachy reported...   Myoview 6/15 showed good exerc capacity, norm BP response, no CP, no evid ischemia, decr uptake inferiorly on rest images similar to 2013, EF=69%, norm wall motion...  LABS 7/15:  Chems- ok w/ BS=126, A1c=7.1.Marland KitchenMarland Kitchen rec to continue Metform500Bid & get on diet, incr exerc, get wt down!!!   ~  October 10, 2014:  79mo ROV & Jesse Santos indicates it took several wks to get over his URI/AB episode in Oct;      On Advair115-2spBid, Mucinex, flonase, claritin; he reports breathing is stable- min cough, no sput, stable DOE, no edema...     He remains on Niasp500/d; FLP 1/16 showed TChol 150, TG 101, HDL 45, LDL 85    He continues Metform500Bid for his DM; Labs 1/16 showed BS=133, A1c=7.2    He has some lower abd pain/ cramping & has used the Levsin w/ relief- c/w IBS, some diarrhea, nausea, groin discomfort; he had colonoscopy 2012 w/ 2 polyps removed & f/u planned 2017...     He sees Jesse Santos for Ortho & takes Tramadol50 aS NEEDED... We reviewed prob list, meds, xrays and labs> see below for updates >>   CXR 1/16 showed norm heart size, clear lungs, DJD in Tspine, NAD...  LABS 1/16:  FLP- at goals on Niasp;  Chems- ok w/ BS=133  A1c=7.2;  CBC- wnl;  TSH=1.59;  PSA=1.25...   ~  April 10, 2015:  22mo ROV & Jesse Santos reports stable w/ CC= arthritis pain in shoulders/ hips, he sees Jesse Santos, told he needs left THR but he is holding off, Tramadol helps;  We reviewed the following medical problems during today's office visit >>    HOH> he has bilat hearing aides...    AR> on allergy shots, OTC antihist, Saline, Flonase; gets occas bouts of sinusitis but overall improved & no recent infections reported...     OSA> Sleep Study was in 2/04 w/ RDI=45 & desat to 88% w/ mod snoring & leg jerks, but he states he never could use the CPAP effectively & states he sleeps fine now if he stays on his side; denies daytime sleepiness or other issues...    COPD> exsmoker quit 1981; on AdvairHFA115-2spBid, Mucinex prn; he doesn't like Pred Rx; he had Bronchitic exac treated 10/15 w/ Levaquin, Medrol, Mucinex, etc & improved; needs incr exercise program...    HBP> on Aten25, Losar100; BP=120/70, he denies CP, palpit, dizzy, SOB, edema, etc...    CAD> on ASA81; followed by Jesse Santos- CT Abd 2010 showed coronary calcif, Treadmill was neg; Myoview 3/13 was neg,  wnl; last seen 4/15- no new symptoms, active w/ yard work, EKG showed wnl, they decided to do Treadmill (borderline ST depression), then Myoview 6/15 showed NEG- no ischemia, decr uptake inferiorly same as 2013, EF=69%, norm wall motion, low risk...    Hyperlipid> on Niacin'500mg'$ ; FLP 1/16 showed TChol 150, TG 101, HDL 45, LDL 85; continue same, get wt down...    DM> on Metform500Bid; wt=222#, BMI=29; Labs 7/16 showed BS=129, A1c=6.7; rec to continue same med, better diet, get wt down!..    GI- GERD, dysphagia, Divertics, IBS, Polyps> on Protonix40, Miralax, Levsin0.125 prn; followed by Jesse Santos & stable- last colon 5/12 w/ severe divertics & 2 sm adenomas removed...    GU- Kid stone, BPH> on Flomax0.4 & Cialis prn; PSA remains wnl (PSA 1/16= 1.25)...    DJD, LBP> left knee complaints w/ shots from Jesse Santos,  similar for left shoulder; hx bilat hip AVN noted on prev CT Abd but he has min complaints and uses Tylenol prn (holding off on surg)...    Anxiety> he does not want anxiolytic meds.[name] EXAM shows Afeb, VSS, O2sat=97%;  Heent- neg, mallampati2;  Chest- clear w/o w/r/r;  Heart- RR w/o m/r/g;  Abd- soft, neg, nontender;  Ext- neg w/o c/c/e;  Neuro- intact...  Labs 7/16>  Chems- ok w/ BS=129, A1c=6.7, Cr=0.83... IMP/PLAN>>  Jesse Santos is stable, meds refilled, he's already had the 2016 Flu vaccine & up to date;  We discussed diet/ exercise/ wt reduction... ROV in 80mo  ~  October 15, 2015:  620moOV & Jesse Santos saw TP 07/2015 for a lesion on tongue- sent to Derm Jesse Santos ?lichenoid mucositis (rx w/ several steroid shots & topical meds) & may need [facility] [employer] [facility]. He has mult somatic complaints> LBP, leg weakness, HAs, cough/ congestion/ clear mucous (yet he states exercise on treadmill for 3028mat 2.6mp29m.. We reviewed the following medical problems during today's office visit >>     HOH> he has bilat hearing aides...    AR> on allergy shots per Jesse Santos, OTC antihist, Saline, Flonase; gets occas bouts of sinusitis but overall improved & no recent infections reported...     OSA> Sleep Study 2/04 w/ RDI=45 & desat to 88% w/ mod snoring & leg jerks, but he states he never could use the CPAP effectively & states he sleeps fine now if he stays on his side; denies daytime sleepiness or other issues...    COPD> exsmoker quit 1981; on AdvairHFA115-2spBid, Mucinex prn; he doesn't like Pred Rx; prev Bronchitic exac treated w/ Levaquin, Medrol, Mucinex & improved; needs incr exercise program...    HBP> on Aten25, Losar100; BP=126/70, he denies CP, palpit, dizzy, SOB, edema, etc...    CAD> on ASA81; followed by Jesse Santos- CT Abd 2010 showed coronary calcif, Treadmill was neg; Myoview 3/13 was neg, wnl; last seen 4/15- no new symptoms, active w/ yard work, EKG showed wnl, they decided to do Treadmill (borderline ST  depression), then Myoview 6/15 showed NEG- no ischemia, decr uptake inferiorly same as 2013, EF=69%, norm wall motion, low risk...    Hyperlipid> on Niacin'500mg'$ ; FLP 1/17 showed TChol 132, TG 52, HDL 46, LDL 75; continue same, get wt down...    DM> on Metform500Bid; wt=220#, BMI=29; Labs 7/16 showed BS=129, A1c=6.7; rec to continue same med, better diet, get wt down; Labs 1/17 showed BS=134, A1c=6.8...  Marland KitchenMarland KitchenGI- GERD, dysphagia, Divertics, IBS, Polyps> on Protonix40, Miralax, Levsin0.125 prn; followed by Jesse Santos & stable- last colon 5/12 w/ severe divertics & 2 sm adenomas  removed...    GU- Kid stone, BPH> on Flomax0.4 & Cialis prn; PSA remains wnl (PSA 1/16= 1.25, 1/17=1.06)...    DJD, LBP> left knee complaints w/ shots from Jesse Santos, similar for left shoulder; hx bilat hip AVN noted on prev CT Abd but he has min complaints and uses Tylenol prn (holding off on surg)...    Anxiety> he does not want anxiolytic meds.[name] EXAM shows Afeb, VSS, O2sat=98%;  Heent- neg, mallampati2;  Chest- clear w/o w/r/r;  Heart- RR w/o m/r/g;  Abd- soft, neg, nontender;  Ext- neg w/o c/c/e;  Neuro- intact...  LABS 10/15/15>  FLP- at goals on diet + niacin;  Chems- ok x BS=134, A1c=6.8 on Metform500bid;  CBC- wnl;  TSH=1.08;  PSA=1.06 IMP/PLAN>>  Jesse Santos has mult somatic complaints but objectively in good shape; we discussed ENT 2nd opinion re tongue lesion vs [facility] referral per Derm, he will decide; continue present Rx & ROV 34mo..  ~  April 13, 2016:  667moOV and after the last visit Jesse Santos saw DrShoemaker, ENT 10/29/15 for the lesion on his tongue- his note is reviewed in Care Everywhere, he concurred w/ topical steroid therapy for this chronic tongue irritation & gave him Triamcinolone cream;  Jesse Santos [facility] 04/01/16 7 note reviewed, bx proven lichenoid dermatitis, exam showed large erosions on lat border of tongue c/w oral erosive lichen planus; He rec Diflucan, Mycelex (for prob candida) &  Tacrolimus (Prograf) to dissolve in water & swish as directed=> pt indicates that he is improved, and they are planning f/u eval... we reviewed the following medical problems during today's office visit >>     HOH> he has bilat hearing aides & has been checked by DrShoemaker.    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    AR> on allergy shots per Jesse Santos, OTC antihist, Saline, Flonase; gets occas bouts of sinusitis but overall improved & no recent infections reported...     OSA> Sleep Study 2/04 w/ RDI=45 & desat to 88% w/ mod snoring & leg jerks, but intol to CPAP & never used; states he sleeps fine now if he stays on his side; denies daytime sleepiness or other issues, naps 1/7...    COPD> exsmoker quit 1981; on AdvairHFA115-2spBid w/ spacer, Mucinex prn; he doesn't like Pred Rx; prev Bronchitic exac treated w/ antibiotics (Levaquin/ Augmentin), Medrol, Mucinex & improved; needs incr exercise program...    HBP> on Aten25, Losar100; BP=142/68, he denies CP, palpit, dizzy, SOB, edema, etc...    CAD> on ASA81; followed by Jesse Santos- seen 12/19/15, CT Abd 2010 showed coronary calcif, Treadmill was neg (borderline ST depression); Myoview 3/13 was neg, wnl & Myoview 6/15 showed NEG- no ischemia, decr uptake inferiorly same as 2013, EF=69%, norm wall motion, low risk... He denies CP, palpit, dizzy, SOB, edema, etc; he remains active w/ yard work etc...    Hyperlipid> on Niacin50026mFLP 1/17 showed TChol 132, TG 52, HDL 46, LDL 75; continue same, get wt down...    DM> on Metform500Bid; wt=218#, BMI=29; Labs 7/16 showed BS=129, A1c=6.7; rec to continue same med, better diet, get wt down; Labs 1/17 showed BS=134, A1c=6.8 and A1c 04/13/16= 6.7, stable...    GI- GERD, dysphagia, Divertics, IBS, Polyps> on Protonix40, Miralax, Levsin0.125 prn; followed by Jesse Santos & stable- last colon 5/12 w/ severe divertics & 2 sm adenomas  removed...    GU- Kid stone, BPH> on Flomax0.4 &  Cialis prn; PSA remains wnl (PSA 1/16= 1.25, 1/17=1.06)...    DJD, LBP> left knee complaints w/ shots from Jesse Santos, similar for left shoulder; hx bilat hip AVN noted on prev CT Abd but he has min complaints and uses Tylenol prn (holding off on surg)...    Anxiety> he does not want anxiolytic meds.[name] EXAM shows Afeb, VSS, O2sat=98%;  Heent- neg, mallampati2;  Chest- clear w/o w/r/r;  Heart- RR w/o m/r/g;  Abd- soft, neg, nontender;  Ext- neg w/o c/c/e;  Neuro- intact...  LABS 04/13/16:  BMet- ordered but not done;  A1c=6.7.Marland KitchenMarland Kitchen IMP/PLAN>>  Jesse Santos is stable w/ his mult medical issues as above- oral lichen planus improved on regimen from Derm at [facility]; BP controlled, he remains active w/o angina, FLP & DM well regualted=> continue same meds.  ~  October 15, 2016:  41moROV & Jesse Santos was seen by SG 07/2016 & TP 08/2016 w/ cough prod of green mucus, sinus congestion, but no f/c/s; he was placed on MTX by DrJorizzo, [facility] due to his tongue lesion felt to be oral lichen planus, 2=> incr to 3 tabs once per week along w/ Folate, Mycelex and Prograf (1 cap dissolved in water & swished);  He remains on AdvairHFA-2spBid & he's careful to rinse after these treatments too;  He was treated w/ Levaquin + Pred taper and he followed up w/ TP 09/08/16-- improved w/ decr cough & dyspnea, no wheezing, remains on Advair, flonase, claritin, mucinex... we reviewed the following medical problems during today's office visit >>     HOH> he has bilat hearing aides & has been checked by DrShoemaker.    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    AR> on allergy shots per Jesse Santos, OTC antihist, Saline, Flonase; gets occas bouts of sinusitis but overall improved & no recent infections reported...     OSA> Sleep Study 2/04 w/ RDI=45 & desat to 88% w/ mod  snoring & leg jerks, but intol to CPAP & never used; states he sleeps fine now if he stays on his side; denies daytime sleepiness or other issues, naps 1/7...    COPD> exsmoker quit 1981; on AdvairHFA115-2spBid w/ spacer, Mucinex prn; he doesn't like Pred Rx; prev Bronchitic exac treated w/ antibiotics (Levaquin/ Augmentin), Medrol, Mucinex & improved; needs incr exercise program...    HBP> on Aten25, Losar100; BP=116/60, he denies CP, palpit, dizzy, SOB, edema, etc...    CAD> on ASA81; followed by Jesse Santos- seen 12/19/15, CT Abd 2010 showed coronary calcif, Treadmill was neg (borderline ST depression); Myoview 3/13 was neg, wnl & Myoview 6/15 showed NEG- no ischemia, decr uptake inferiorly same as 2013, EF=69%, norm wall motion, low risk... He denies CP, palpit, dizzy, SOB, edema, etc; he remains active w/ yard work etc...    Hyperlipid> on Niacin5039m FLP 1/17 showed TChol 132, TG 52, HDL 46, LDL 75; continue same, get wt down...    DM> on Metform500Bid; wt=216#, BMI=29; Labs 1/17 showed BS=134, A1c=6.8; rec to continue same med, better diet, get wt down; Labs 2/18 showed BS=136, A1c=6.7; stable...    GI- GERD, dysphagia, Divertics, IBS, Polyps> on Protonix40, Miralax, Levsin0.125 prn; followed by Jesse Santos & stable- last colon 5/12 w/ severe divertics & 2 sm adenomas removed; he wants off PPI=> change to PEPCID40...    GU- Kid stone, BPH> on Flomax0.4 & Cialis prn; PSA remains wnl (PSA 1/16= 1.25, 1/17=1.06, 2/18=1.45)...Marland KitchenMarland Kitchen  DJD, LBP> left knee complaints w/ shots from Jesse Santos, similar for left shoulder; hx bilat hip AVN noted on prev CT Abd but he has min complaints and uses Tylenol prn (holding off on surg)...    Anxiety> he does not want anxiolytic meds.[name] EXAM shows Afeb, VSS, O2sat=99%;  Wt=216#, 6'2"Tall, BMI=28;  Heent- neg, mallampati2;  Chest- clear w/o w/r/r;  Heart- RR w/o m/r/g;  Abd- soft, neg, nontender;  Ext- neg w/o c/c/e;  Neuro- intact...  CXR 08/10/16>  Norm heart size, atherosclerotic  Ao, clear lungs- NAD, degen changes in Tspine w/ some scoliosis...  LABS 10/15/16>  FLP- at goals on Niaspan;  Chems- ok w/ BS=136, A1c=6.7;  CBC- wnl w/ Hg=13.9, Fe=169 (42%sat), B12=504;  TSH=1.04,  PSA=1.45...  IMP/PLAN>>  Jesse Santos is still dealing w/ his oral prob= Lichen Planus & improved w/ treatment program under the direction [facility]- on MTX 3/wk, Prograf, Mycelex;  His wife is concerned about his being on Protonix40 for many yrs, followed by Jesse Santos w/ GERD/ dysphagia, last EGD 2009 was essentially normal- OK to stop the PPI & change to Pepcid40;  Continue other meds the same + diet/ exercise...   ~  November 18, 2016:  80mo ROV & post hospital visit> Jesse Santos was hospitalized 3/1 - 11/15/16 by [facility] w/ multilobar community acquired pneumonia (predom lingular w/ CT abn in LLL & RLL as well);  He woke w/ cough, malaise, myalgias, & chills;  Temp in ER was 103, BP was wnl, O2sat was 94% on 2L/min, CXR showed LLL pneumonia, no organism specified, treated w/ rocephin & zithromax=> disch on oral zithromax & medrol taper...     Currently feeling better- sl sore throat, cough, sm amt beige sput, no hemoptysis, +SOB/DOE & still "weak", some chest discomfort=> we decided to f/u CXR/ Labs (see below)...    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    AR> on allergy shots per Jesse Santos, OTC antihist, Saline, Flonase; gets occas bouts of sinusitis but overall improved & no recent infections reported...     OSA> Sleep Study 2/04 w/ RDI=45 & desat to 88% w/ mod snoring & leg jerks, but intol to CPAP & never used; states he sleeps fine now if he stays on his side; denies daytime sleepiness or other issues, naps 1/7...    COPD> exsmoker quit 1981; on AdvairHFA115-2spBid w/ spacer, Mucinex prn; he doesn't like Pred Rx; prev Bronchitic exac treated w/ antibiotics (Levaquin/ Augmentin),  Medrol, Mucinex & improved; needs incr exercise program... EXAM shows Afeb, VSS, O2sat=99%;  Wt=216#, 6'2"Tall, BMI=28;  Heent- neg, mallampati2;  Chest- clear w/o w/r/r;  Heart- RR w/o m/r/g;  Abd- soft, neg, nontender;  Ext- neg w/o c/c/e;  Neuro- intact...  CXR 11/12/16 showed LLL pneumonia, borderline cardiomegaly, aortic calcif  CT Chest 11/13/16>  Neg for PE, multilobar left lung pneumonia, poss early RLL infiltrate, calcif aortic & coronary arteries...  CXR today 11/18/16 showed norm heart size, aortic atherosclerosis, left lung opacity is resolving/ improved, no effusion etc...  LABS 11/2016>  Chems- ok x BS 110-190;  CBC- ok x WBC 14K=>9K  LABS 11/18/16>  Chems- ok w/ BS=110;  CBC- improved w/ Hg=13.7, WBC=8.5 IMP/PLAN>>  Jesse Santos has improved from his recent bout of pneumonia (no organism identified); he has finished the antibiotics (Zithromax), & weaning off the Medrol;  We plan ROV recheck in 3-4weeks...    ~  December 09, 2016:  3wk [place] is improved overall "just weak" he says;  BP is good at home on Losar50 reading 130-140/ 60-70 at home;  He denis much cough, sput, no hemoptysis, no CP, SOB improved & now active on his treadmill, doing housework, etc;  His CC is nasal drainage;  F/u CXR today is resolved- NAD...     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    AR> on allergy shots per Jesse Santos, OTC antihist, Saline, Flonase; gets occas bouts of sinusitis but overall improved & no recent infections reported...     OSA> Sleep Study 2/04 w/ RDI=45 & desat to 88% w/ mod snoring & leg jerks, but intol to CPAP & never used; states he sleeps fine now if he stays on his side; denies daytime sleepiness or other issues, naps 1/7...    COPD> exsmoker quit 1981; on AdvairHFA115-2spBid w/ spacer, Mucinex prn; he doesn't like Pred Rx; prev Bronchitic exac treated w/  antibiotics (Levaquin/ Augmentin), Medrol, Mucinex & improved; needs incr exercise program...    Medical Issues>  HBP, CAD, HL, DM, Gastroparesis, BPH, DJD, anxiety... EXAM shows Afeb, VSS, O2sat=98%;  Wt=211#, 6'2"Tall, BMI=28;  Heent- neg, mallampati2;  Chest- clear w/o w/r/r;  Heart- RR w/o m/r/g;  Abd- soft, neg, nontender;  Ext- neg w/o c/c/e;  Neuro- intact...  CXR 12/09/16 (independently reviewed by me in the PACS system) showed norm heart size, clear lungs- NAD... IMP/PLAN>>  Jesse Santos's pneumonia has resolved & he is approaching his baseline "just weak" he says;  Rec to continue his baseline regimen, diet, exercise, etc...          Problem List:  HEARING LOSS (ICD-389.9) - he has bilat hearing aides...  ALLERGIC RHINITIS (ICD-477.9) - on allergy shots from DrESL Gaetano Hawthorne)... plus Claritin, Saline, Mucinex, Flonase. ~  occas bouts of sinusitis requiring antibiotic Rx... ~  8/13: he had allergy f/u Jesse Santos> skin testing 2010 pos to dust mites and molds; doing well on shots once/mo; stable & no changes made... ~  3/14: he presented w/ an upper resp infection & treated w/ Depo, Dosepak, Zithromax... ~  8/14:  He had allergy f/u Jesse Santos> Advair, Flonase, Saline, Mucinex; he checked FENO- reported normal... ~  7/15: stable on allergy meds from Jesse Santos, plus AdvairHFA115; he had sinus/ AB exac 5/15 treated by TP w/ ZPak=> Levaquin & resolved...  OBSTRUCTIVE SLEEP APNEA (ICD-327.23) - sleep study 8/04 showed RDI= 45 & desat to 88%... mod snoring and leg jerks without sleep disruption... CPAP perscribed but not using it now- states he can't sleep w/ it on & furthermore he is resting well as long as he stays on his side... denies daytime hypersomnolence & not interested in re-evaluation.  COPD (ICD-496) - ex-smoker, quit 1981 >>  ~  on ADVAIR 100Bid, PROAIR Prn, MUCINEX Bid... he had Pneumovax in 2008... doesn't like Pred Rx...  ~  10/11:  doing well except recent cough, min green sputum  (no change in dyspnea)- we will Rx w/ Augmentin, incr Mucinex/ Fluids. ~  4/12:  Breathing at baseline, continue maintenance meds... ~  CXR 12/13 showed normal heart size, clear lungs, NAD... ~  1/14:  He's had 2 exac this past yr- saw TP w/ Rx & improved... ~  1/15:  He continues to do well on Advair + allergy Rx from Jesse Santos w/ Flonase, Saline, Mucinex... ~  7/15: exsmoker  quit 1981; on AdvairHFA115-2spBid, Mucinex prn; he doesn't like Pred Rx; he had Bronchitic exac treated by TP 5/15 w/ ZPak, then Levaquin, Mucinex, etc & improved; needs incr exercise program... ~  10/15: seen w/ refractory bronchitic episode reqiring ZPak=>Levaquin, Medrol, Mucinex, Hydromet, etc... ~  1/16: He had a URI/ AB exac treated 10/15 w/ Levaquin/ Medrol/ Mucinex & resolved; now at baseline on Advair115-2spBid etc... ~  11/2016> he developed a LLL pneumonia (nos) & resolved w/ Rocephin/ Zithromax, Medrol, Nebs, etc...  HYPERTENSION (ICD-401.9) - prev on diet alone> then on Diovan320, but we decided to change to LOSARTAN 100mg /d 5/13 to save $$ ~  He claims that Norvasc & HCTZ "stopped my urine flow"... ~  10/11:  BP= 128/80 & even better at home he says... denies HA, fatigue, visual changes, CP, palipit, dizziness, syncope, dyspnea, edema, etc... ~  4/12:  Pt has seen TP, Jesse Santos, & checking BP daily at home; tried on Norvasc but claims it decr his urine flow; now on Diovan & improved w/ BP= 132/70... ~  5/12:  BP remains well controlled on Diovan 320mg /d= 120/62 today. ~  11/12:  BP= 142/82 on Diovan160 now; denies CP, palpit, dizzy, SOB, edema... ~  5/13:  BP= 128/66 & he's requesting change to cheaper med; rec switch Diovan to LOSARTAN 100mg /d... ~  1/14: on Losar100; BP=140/84, he notes incr HR he says & we decided to add low dose ATENOLOL25mg /d... ~  7/14: on Aten25, Losar100> BP=128/72 & he denies CP, palpit, SOB, edema, etc... ~  1/15: BP controlled on Aten25, Losar100 w/ BP= 130/80 & he denies CP,  palpit, SOB, edema. ~  7/15: on Aten25, Losar100; BP=124/70, he denies CP, palpit, dizzy, SOB, edema, etc. ~  10/15: on Aten25, Losar100; BP= 140/60 & remains stable hemodynamically... ~  BP remains under good control on same meds...  R/O CAD (ICD-414.00) - on ASA 81mg /d... Followed by Jesse Santos & his notes are reviewed. ~  hx neg cardiac cath 1988 by DrJoe [place]... ~  NuclearStressTest 10/02 was norm- no scar or ischemia, EF=55%. ~  CT Abd 4/10 via ER showed coronary calcif... referred to Cards. ~  Eval by Jesse Santos 6/10 showed norm EKG & cardiac exam... treadmill ordered. ~  Treadmill 6/10 showed reasonable exerc tolerance, no ischemic changes, few PVCs, sl incr BP... ~  EKG 3/13 showed NSR, rate76, WNL, NAD... ~  Myoview 3/13 was neg> exercised for 26min, stopped for fatigue & CP; no ST seg changes, occas PACs & PVCs; hypertensive BP response; no ischemia, EF=71%, normal wall motion... ~  He saw Jesse Santos 4/14> hx CAD, doing satis on ASA & BP rx w/o angina & rec to follow program of aggressive risk factor reduction, no change in meds.  ~  EKG 4/14 showed NSR, rate60, wnl, NAD.. ~  6/15: .on ASA81; followed by Jesse Santos- last seen 4/15- no new symptoms, active w/ yard work, EKG showed wnl, they decided to do Treadmill (borderline ST depression), then Myoview 6/15 showed NEG- no ischemia, decr uptake inferiorly same as 2013, EF=69%, norm wall motion, low risk...  MIXED HYPERLIPIDEMIA (ICD-272.2) - low HDL and NIASPAN 500mg /d started 4/09... ~  [place] 4/09 showed TChol 160, TG 106, HDL 26, LDL 113... rec- diet + Niaspan 500/d. ~  FLP 10/09 showed TChol 155, TG 78, HDL 33, LDL 106... ~  FLP 4/10 showed TChol 133, TG 63, HDL 30, LDL 91... rec> continue same. ~  FLP 4/11 showed TChol 144, TG 61, HDL 38, LDL 94 ~  FLP  10/11 showed TChol 155, TG 81, HDL 40, LDL 99 ~  FLP 4/12 showed TChol 142, TG 69, HDL 38, LDL 90 ~  FLP 11/12 showed TChol 165, TG 82, HDL 43, LDL 106 ~  FLP 5/13 in Niasp500  showed TChol 155, TG 72, HDL 46, LDL 95... rec change to generic. ~  FLP 1/14 on Niacin500 showed TChol 140, TG 56, HDL 39, LDL 90 ~  FLP 1/15 on Niaspan500 showed TChol 148, TG 57, HDL 43, LDL 94  ~  FLP 1/16 on Niaspan500 showed TChol 150, TG 101, HDL 45, LDL 85 ~  FLP 1/17 on Niaspan500 showed TChol 132, TG 52, HDL 46, LDL 75  DM (ICD-250.00) - on diet + METFORMIN 500mg Bid... he reports BS at home all in the 120-130's... ~  labs 4/09 showed BS= 132, HgA1c= 6.6.Marland KitchenMarland Kitchen rec- same med, better diet... ~  labs 10/09 showed BS= 131, HgA1c= 6.3.Marland KitchenMarland Kitchen ~  labs 4/10 showed BS= 117, HgA1c= 6.2.[name]. rec> same meds/ diet Rx. ~  labs 10/10 showed BS= 138, A1c= 6.5 ~  labs 4/11 showed BS= 121, A1c= 6.5.Marland KitchenMarland Kitchen On Metform500Bid + diet. ~  Dilated eye exam 5/11 by DrShapiro- no retinopathy... ~  labs 10/11 (wt=228#) showed BS= 118, A1c= 7.1.[name]. not as good- get on diet or more meds. ~  Labs 4/12 (wt=229#) showed BS= 119, A1c= 7.3... Wrong direction, may need more meds, get wt down! ~  5/12:  Ophthalmology check by DrShapiro was neg- no retinopathy... ~  Labs 11/12 showed BS= 136, A1c= 7.0 ~  Labs 5/13 on MetformBid showed BS= 132, A1c= 7.1.Marland KitchenMarland Kitchen Continue same, get wt down. ~  Ophthalmology check from DrShapiro 5/13> no DM retinopathy... ~  Labs 1/14 showed BS= 134, A1c= 7.5; not as good- needs better diet, get wt down, same meds for now... ~  Labs 7/14 on Metform500Bid showed BS= 140, A1c= 7.3  ~  Labs 1/154 on Metform500Bid showed BS= 129 & A1c= 7.0, and we reviewed meds/ diet/ exercise/ weight... ~  Labs 7/15 on Metform500Bid showed BS= 126, A1c= 7.1 & reminded of need for diet/ exerc/ wt reduction... ~  Labs 1/16 on Metform500Bid showed BS= 133, A1c= 7.2 ~  Labs 1/17 on Metform500Bid showed BS= 134, A1c= 6.8  GERD (ICD-530.81) & ? GASTROPARESIS - on PROTONIX 40mg /d, & off Reglan per Jesse Santos... ~   last EGD 6/07 by Jesse Santos showed gastic polyp... ~  8/10:  given Compazine suppos for gastroparesis flair by DrGessner. ~   4/12:  C/o intermittent dysphagia & referred back to GI for further eval (colonoscopy due as well)...  DIVERTICULOSIS OF COLON (ICD-562.10) - he uses MIRALAX Bid regularly... IRRITABLE BOWEL SYNDROME (ICD-564.1) COLONIC POLYPS (ICD-211.3) ~  last colonoscopy 5/07 by DrSam showed divertics only... f/u 48yrs. ~  CT Abd 4/10 in ER showed atx bases, coronary calcif, s/p GB, abdAo calcif, 72mm renal stone, divertics, bilat hip AVN. ~  Follow up colonoscopy by Jesse Santos 5/12 showed severe diverticulosis, 2 sm polyps= tubular adenoma & f/u planned 5 yrs...  RENAL CALCULUS, HX OF (ICD-V13.01) BENIGN PROSTATIC HYPERTROPHY, HX OF (ICD-V13.8) - on FLOMAX 0.4mg /d w/ improved symptoms (he states flow better since stopping Vit E supplement). ~  4/12:  Routine PSA= 4.9 (it was 1.02 4/11) & we rx w/ Doxy Bid x14d, thewn plan recheck PSA==> 1.30  DEGENERATIVE JOINT DISEASE (ICD-715.90) - pt states "I have a bum knee" and eval by Jesse Santos w/ viscosupplementation shots... Note: Abd CT w/ bilat hip AVN noted... takes TYLENOL Prn... ~  11/10:  s/p left knee arthroscopy by Jesse Santos... ~  10/11:  s/p fall w/ left knee injury- s/p cortisone shot by Jesse Santos; hx shot in left shoulder too. ~  7/14: he is c/o incr pain in left hip area; known hx AVN & needs f/u by Ortho...  BACK PAIN, LUMBAR (ICD-724.2)  ANXIETY (ICD-300.00) - not currently on meds for nerves.  ANEMIA (ICD-285.9) - prev hx of GIB... ~  labs 4/09 showed Hg= 14.1 ~  labs 4/10 showed Hg= 13.6 ~  labs 4/11 showed Hg= 13.4 ~  Labs 4/12 showed Hg= 13.8 ~  Labs 3/13 showed Hg= 13.1 ~  Labs 1/14 showed Hg= 13.6 ~  Labs 1/15 showed Hg= 13.7 ~  Labs 1/16 showed Hg= 14.1 ~  CBC remains WNL...  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  Health Maintenance: ~  GI: followed by Jesse Santos> Colonoscopy 5/07 by DrSam... f/u planned 5 yrs. ~  GU:  See PSAs recorded above... ~  Immunizations: Pneumovax in 2008 @ age 55;  [place] given 7/15; Tetanus- given  4/10;  he gets yearly seasonal Flu vaccine.   Past Surgical History:  Procedure Laterality Date  . CATARACT EXTRACTION    . CHOLECYSTECTOMY    . KNEE SURGERY  11/10  . SKIN CANCER EXCISION  3/10   left hand     Outpatient Encounter Prescriptions as of 12/09/2016  Medication Sig  . acetaminophen (TYLENOL) 500 MG tablet Take 500 mg by mouth every 6 (six) hours as needed for headache (pain).   [name] aspirin EC 81 MG tablet Take 81 mg by mouth at bedtime.  . Cholecalciferol (VITAMIN D) 2000 UNITS CAPS Take 2,000 Units by mouth daily.   . clotrimazole (MYCELEX) 10 MG troche Take 10 mg by mouth daily.   . COMPRO 25 MG suppository PLACE 1 SUPPOSITORY (25 MG TOTAL) RECTALLY EVERY 12 (TWELVE) HOURS AS NEEDED FOR NAUSEA.  [name] dextromethorphan-guaiFENesin (MUCINEX DM) 30-600 MG per 12 hr tablet Take 1 tablet by mouth every 12 (twelve) hours.  . famotidine (PEPCID) 40 MG tablet Take 1 tablet (40 mg total) by mouth daily. (Patient taking differently: Take 40 mg by mouth at bedtime. )  . fluticasone (FLONASE) 50 MCG/ACT nasal spray Place 2 sprays into both nostrils daily. (Patient taking differently: Place 2 sprays into both nostrils at bedtime. )  . fluticasone-salmeterol (ADVAIR HFA) 115-21 MCG/ACT inhaler Inhale 1 puff into the lungs 2 (two) times daily.  . folic acid (FOLVITE) 1 MG tablet Take 1 mg by mouth daily with lunch.   [name] glucose blood (ONE TOUCH ULTRA TEST) test strip TEST BLOOD SUGAR ONCE DAILY AS DIRECTED  . hyoscyamine (LEVSIN SL) 0.125 MG SL tablet Place 1 tablet (0.125 mg total) under the tongue every 4 (four) hours as needed. As needed for abdominal cramping (Patient taking differently: Place 0.125 mg under the tongue every 4 (four) hours as needed (abdominal cramping). )  . lidocaine (ASPERCREME W/LIDOCAINE) 4 % cream Apply 1 application topically daily as needed (pain).   [name] loratadine (CLARITIN) 10 MG tablet Take 10 mg by mouth daily as needed for allergies.  [name] losartan (COZAAR) 50 MG  tablet Take 50 mg by mouth daily.  . metFORMIN (GLUCOPHAGE) 500 MG tablet Take 1 tablet (500 mg total) by mouth 2 (two) times daily with a meal.  . methotrexate (RHEUMATREX) 2.5 MG tablet Take 3 tablets (7.5 mg total) by mouth See admin instructions. Take 3 tablets (7.5 mg) by mouth on Tuesdays with  lunch, starting 11/24/2016  . Multiple Vitamins-Minerals (MULTIVITAMIN WITH MINERALS) tablet Take 1 tablet by mouth daily.    . niacin (NIASPAN) 500 MG CR tablet Take 1 tablet (500 mg total) by mouth at bedtime.  . nitroGLYCERIN (NITROSTAT) 0.4 MG SL tablet Place 1 tablet (0.4 mg total) under the tongue every 5 (five) minutes as needed for chest pain.  . polyethylene glycol powder (GLYCOLAX/MIRALAX) powder TAKE 17 GRAMS BY MOUTH TWICE DAILY AS NEEDED  . Probiotic Product (PROBIOTIC PO) Take 1 tablet by mouth daily.  . sodium chloride (OCEAN) 0.65 % nasal spray Place 1 spray into the nose 3 (three) times daily as needed for congestion.   . tacrolimus (PROGRAF) 1 MG capsule Take 1 mg by mouth See admin instructions. Dissolve 1 capsule (1 mg) in 500 ml of water and keep in refrigerator - swish and spit small amount twice daily  . tamsulosin (FLOMAX) 0.4 MG CAPS capsule Take 1 capsule (0.4 mg total) by mouth 2 (two) times daily.  . traMADol (ULTRAM) 50 MG tablet TAKE 1 TABLET THREE TIMES A DAY AS NEEDED FOR PAIN (Patient taking differently: Take 50 mg by mouth 3 (three) times daily as needed (pain). )  . [DISCONTINUED] losartan (COZAAR) 25 MG tablet Take 1 tablet (25 mg total) by mouth daily. (Patient not taking: Reported on 12/09/2016)  . [DISCONTINUED] magic mouthwash SOLN Take 5 mLs by mouth 4 (four) times daily. (Patient not taking: Reported on 12/09/2016)  . [DISCONTINUED] methylPREDNISolone (MEDROL) 4 MG tablet Take 1tab twice a day for 1day, Take 1tab three times a day for 1day, Take 1tab twice a day for 1day, Take 1tab a day for 1day, and stop. (Patient not taking: Reported on 12/09/2016)   No  facility-administered encounter medications on file as of 12/09/2016.     Allergies  Allergen Reactions  . Prednisone Other (See Comments)    REACTION: high dose intolerance - causes numbness from waist down - low dose tapered course ok    Immunization History  Administered Date(s) Administered  . H1N1 08/21/2008  . Influenza Split 06/01/2011, 05/26/2012  . Influenza Whole 06/06/2008, 06/21/2009, 06/06/2010  . Influenza, High Dose Seasonal PF 07/03/2016  . Influenza,inj,Quad PF,36+ Mos 06/07/2013, 07/09/2014, 06/10/2015  . Pneumococcal Conjugate-13 04/05/2014  . Pneumococcal Polysaccharide-23 06/30/2006  . Zoster 09/14/2010    Current Medications, Allergies, Past Medical History, Past Surgical History, Family History, and Social History were reviewed in [employer] record.    Review of Systems         See HPI - all other systems neg except as noted... The patient complains of decreased hearing, dyspnea on exertion, headaches, and difficulty walking.  The patient denies anorexia, fever, weight loss, weight gain, vision loss, hoarseness, chest pain, syncope, peripheral edema, prolonged cough, hemoptysis, abdominal pain, melena, hematochezia, severe indigestion/heartburn, hematuria, incontinence, muscle weakness, suspicious skin lesions, transient blindness, depression, unusual weight change, abnormal bleeding, enlarged lymph nodes, and angioedema.     Objective:   Physical Exam      WD, WN, 81 y/o WM in NAD... GENERAL:  Alert & oriented; pleasant & cooperative... HEENT:  [place]/AT, EOM-full, PERRLA, EACs-clear, TMs-wnl, NOSE- pale, congested, THROAT-clear, but lichenoid lesion on lat tongue. NECK:  Supple w/ fairROM; no JVD; normal carotid impulses w/o bruits; no thyromegaly or nodules palpated; no lymphadenopathy. CHEST:  Clear to P & A w/o wheezing, rhonchi, rales, or signs of consolidation... HEART:  Regular Rhythm; without murmurs/ rubs/ or gallops  detected...  ABDOMEN:  Soft &  nontender; normal bowel sounds; no organomegaly or masses palpated., no guarding, or rebound EXT: without deformities, mild arthritic changes; no varicose veins/ venous insuffic/ or edema. NEURO:  CN's intact;  no focal neuro deficits... DERM:  No lesions noted; no rash etc...  RADIOLOGY DATA:  Reviewed in the EPIC EMR & discussed w/ the patient...  LABORATORY DATA:  Reviewed in the EPIC EMR & discussed w/ the patient...   Assessment & Plan:    Lesion on tongue>. LICHEN PLANUS and he is improved on Rx from [place] (MTX, Mycelex, & Prograf=> dissolve in water/ swish/ & spit)...  Hx Acute Bronchitis> presented 10/15 w/ refractory bronchitic episode; given ZPak=>Levaquin, Depo80/ Medrol dosepak, Mucinex, Hydromet, etc=> resolved... COPD exac>  He continues on Advair, Proair rescue, Mucinex; also Claritin, Flonase, Saline; stable continue same Rx... LLL Pneumonia 11/2016>  Hosp by [facility] & treated w/ Rocephin/Zithromax, Medrol taper, NEBS, etc & improved => resolved...  HBP>  On LOSARTAN 100mg /d & VOHY07 w/ good control of BP, continue same...  R/O CAD>  Followed by Jesse Santos, and doing satis w/ neg Myoview 6/15... continue ASA & risk factor reduction....  Lipids>  Looks satis on his diet + Niacin...  DM>  Control is fair w/ A1c stable at 6.7 he was told there are more meds in his future if he doesn't get his wt down (currently~225#)...  GI>  Per Jesse Santos w/ GERD, ?Gastroparesis, Divertics, IBS, Colon polyps> f/u colon 5/12 w/ divertics & 2 sm adenomas, repeat planned ~40yrs...  ORTHO>  Per Jesse Santos w/ avasc necrosis in left hip & they are watching it for future surg...  Anxiety>  Certainly an issue, he does not want anxiolytic Rx...   Patient's Medications  New Prescriptions   No medications on file  Previous Medications   ACETAMINOPHEN (TYLENOL) 500 MG TABLET    Take 500 mg by mouth every 6 (six) hours as needed for headache (pain).    ASPIRIN  EC 81 MG TABLET    Take 81 mg by mouth at bedtime.   CHOLECALCIFEROL (VITAMIN D) 2000 UNITS CAPS    Take 2,000 Units by mouth daily.    CLOTRIMAZOLE (MYCELEX) 10 MG TROCHE    Take 10 mg by mouth daily.    COMPRO 25 MG SUPPOSITORY    PLACE 1 SUPPOSITORY (25 MG TOTAL) RECTALLY EVERY 12 (TWELVE) HOURS AS NEEDED FOR NAUSEA.   DEXTROMETHORPHAN-GUAIFENESIN (MUCINEX DM) 30-600 MG PER 12 HR TABLET    Take 1 tablet by mouth every 12 (twelve) hours.   FAMOTIDINE (PEPCID) 40 MG TABLET    Take 1 tablet (40 mg total) by mouth daily.   FLUTICASONE (FLONASE) 50 MCG/ACT NASAL SPRAY    Place 2 sprays into both nostrils daily.   FLUTICASONE-SALMETEROL (ADVAIR HFA) 115-21 MCG/ACT INHALER    Inhale 1 puff into the lungs 2 (two) times daily.   FOLIC ACID (FOLVITE) 1 MG TABLET    Take 1 mg by mouth daily with lunch.    GLUCOSE BLOOD (ONE TOUCH ULTRA TEST) TEST STRIP    TEST BLOOD SUGAR ONCE DAILY AS DIRECTED   HYOSCYAMINE (LEVSIN SL) 0.125 MG SL TABLET    Place 1 tablet (0.125 mg total) under the tongue every 4 (four) hours as needed. As needed for abdominal cramping   LIDOCAINE (ASPERCREME W/LIDOCAINE) 4 % CREAM    Apply 1 application topically daily as needed (pain).    LORATADINE (CLARITIN) 10 MG TABLET    Take 10 mg by mouth daily as needed for allergies.  LOSARTAN (COZAAR) 50 MG TABLET    Take 50 mg by mouth daily.   METFORMIN (GLUCOPHAGE) 500 MG TABLET    Take 1 tablet (500 mg total) by mouth 2 (two) times daily with a meal.   METHOTREXATE (RHEUMATREX) 2.5 MG TABLET    Take 3 tablets (7.5 mg total) by mouth See admin instructions. Take 3 tablets (7.5 mg) by mouth on Tuesdays with lunch, starting 11/24/2016   MULTIPLE VITAMINS-MINERALS (MULTIVITAMIN WITH MINERALS) TABLET    Take 1 tablet by mouth daily.     NIACIN (NIASPAN) 500 MG CR TABLET    Take 1 tablet (500 mg total) by mouth at bedtime.   NITROGLYCERIN (NITROSTAT) 0.4 MG SL TABLET    Place 1 tablet (0.4 mg total) under the tongue every 5 (five) minutes as  needed for chest pain.   POLYETHYLENE GLYCOL POWDER (GLYCOLAX/MIRALAX) POWDER    TAKE 17 GRAMS BY MOUTH TWICE DAILY AS NEEDED   PROBIOTIC PRODUCT (PROBIOTIC PO)    Take 1 tablet by mouth daily.   SODIUM CHLORIDE (OCEAN) 0.65 % NASAL SPRAY    Place 1 spray into the nose 3 (three) times daily as needed for congestion.    TACROLIMUS (PROGRAF) 1 MG CAPSULE    Take 1 mg by mouth See admin instructions. Dissolve 1 capsule (1 mg) in 500 ml of water and keep in refrigerator - swish and spit small amount twice daily   TAMSULOSIN (FLOMAX) 0.4 MG CAPS CAPSULE    Take 1 capsule (0.4 mg total) by mouth 2 (two) times daily.   TRAMADOL (ULTRAM) 50 MG TABLET    TAKE 1 TABLET THREE TIMES A DAY AS NEEDED FOR PAIN  Modified Medications   No medications on file  Discontinued Medications   LOSARTAN (COZAAR) 25 MG TABLET    Take 1 tablet (25 mg total) by mouth daily.   MAGIC MOUTHWASH SOLN    Take 5 mLs by mouth 4 (four) times daily.   METHYLPREDNISOLONE (MEDROL) 4 MG TABLET    Take 1tab twice a day for 1day, Take 1tab three times a day for 1day, Take 1tab twice a day for 1day, Take 1tab a day for 1day, and stop.

## 2016-12-28 DIAGNOSIS — Z79899 Other long term (current) drug therapy: Secondary | ICD-10-CM | POA: Diagnosis not present

## 2016-12-28 DIAGNOSIS — L439 Lichen planus, unspecified: Secondary | ICD-10-CM | POA: Diagnosis not present

## 2016-12-28 DIAGNOSIS — L438 Other lichen planus: Secondary | ICD-10-CM | POA: Diagnosis not present

## 2016-12-28 DIAGNOSIS — Z5181 Encounter for therapeutic drug level monitoring: Secondary | ICD-10-CM | POA: Diagnosis not present

## 2017-01-21 ENCOUNTER — Telehealth: Payer: Self-pay | Admitting: Pulmonary Disease

## 2017-01-21 MED ORDER — LEVOFLOXACIN 500 MG PO TABS: 500.0000 mg | ORAL_TABLET | Freq: Every day | ORAL | 0 refills | Status: DC

## 2017-01-21 MED ORDER — METHYLPREDNISOLONE 4 MG PO TBPK: ORAL_TABLET | ORAL | 0 refills | Status: DC

## 2017-01-21 NOTE — Telephone Encounter (Signed)
SN  Please Advise-Sick Message  Pt called in c/o sinus pressure,sinus congestion,draining down the back of his throat, causing him a sore throat,slight wheezing,breathing is doing ok, Denies chest tightness,fever. He has tried mucinex and Flonase but no relief. Pt would like something called in before he goes out of town this weekend  Allergies  Allergen Reactions  . Prednisone Other (See Comments)    REACTION: high dose intolerance - causes numbness from waist down - low dose tapered course ok

## 2017-01-21 NOTE — Telephone Encounter (Signed)
Pt is aware of SN's recommendations and voiced his understanding. Rx has been sent to preferred pharmacy. Nothing further needed.

## 2017-01-21 NOTE — Telephone Encounter (Signed)
Per SN---  levaquin 500  #7  1 daily Medrol dosepak Take otc align once daily and mucinex 1200 mg  BID with plenty of fluids. thanks

## 2017-02-17 ENCOUNTER — Other Ambulatory Visit: Payer: Self-pay | Admitting: Dermatology

## 2017-02-17 DIAGNOSIS — D225 Melanocytic nevi of trunk: Secondary | ICD-10-CM | POA: Diagnosis not present

## 2017-02-17 DIAGNOSIS — L814 Other melanin hyperpigmentation: Secondary | ICD-10-CM | POA: Diagnosis not present

## 2017-02-17 DIAGNOSIS — C44319 Basal cell carcinoma of skin of other parts of face: Secondary | ICD-10-CM | POA: Diagnosis not present

## 2017-02-17 DIAGNOSIS — L570 Actinic keratosis: Secondary | ICD-10-CM | POA: Diagnosis not present

## 2017-02-17 DIAGNOSIS — L821 Other seborrheic keratosis: Secondary | ICD-10-CM | POA: Diagnosis not present

## 2017-02-17 DIAGNOSIS — K140 Glossitis: Secondary | ICD-10-CM | POA: Diagnosis not present

## 2017-02-21 ENCOUNTER — Other Ambulatory Visit: Payer: Self-pay | Admitting: Pulmonary Disease

## 2017-03-29 DIAGNOSIS — Z5181 Encounter for therapeutic drug level monitoring: Secondary | ICD-10-CM | POA: Diagnosis not present

## 2017-03-29 DIAGNOSIS — L438 Other lichen planus: Secondary | ICD-10-CM | POA: Diagnosis not present

## 2017-03-29 DIAGNOSIS — B370 Candidal stomatitis: Secondary | ICD-10-CM | POA: Diagnosis not present

## 2017-03-31 DIAGNOSIS — L821 Other seborrheic keratosis: Secondary | ICD-10-CM | POA: Diagnosis not present

## 2017-03-31 DIAGNOSIS — Z85828 Personal history of other malignant neoplasm of skin: Secondary | ICD-10-CM | POA: Diagnosis not present

## 2017-04-14 ENCOUNTER — Encounter: Payer: Self-pay | Admitting: Pulmonary Disease

## 2017-04-14 ENCOUNTER — Ambulatory Visit (INDEPENDENT_AMBULATORY_CARE_PROVIDER_SITE_OTHER): Payer: Medicare Other | Admitting: Pulmonary Disease

## 2017-04-14 VITALS — BP 116/64 | HR 60 | Temp 97.6°F | Ht 73.75 in | Wt 215.0 lb

## 2017-04-14 DIAGNOSIS — M159 Polyosteoarthritis, unspecified: Secondary | ICD-10-CM

## 2017-04-14 DIAGNOSIS — I2510 Atherosclerotic heart disease of native coronary artery without angina pectoris: Secondary | ICD-10-CM

## 2017-04-14 DIAGNOSIS — F411 Generalized anxiety disorder: Secondary | ICD-10-CM

## 2017-04-14 DIAGNOSIS — K2100 Gastro-esophageal reflux disease with esophagitis, without bleeding: Secondary | ICD-10-CM

## 2017-04-14 DIAGNOSIS — J189 Pneumonia, unspecified organism: Secondary | ICD-10-CM

## 2017-04-14 DIAGNOSIS — L439 Lichen planus, unspecified: Secondary | ICD-10-CM

## 2017-04-14 DIAGNOSIS — J449 Chronic obstructive pulmonary disease, unspecified: Secondary | ICD-10-CM | POA: Diagnosis not present

## 2017-04-14 DIAGNOSIS — E119 Type 2 diabetes mellitus without complications: Secondary | ICD-10-CM

## 2017-04-14 DIAGNOSIS — E782 Mixed hyperlipidemia: Secondary | ICD-10-CM

## 2017-04-14 DIAGNOSIS — K3184 Gastroparesis: Secondary | ICD-10-CM

## 2017-04-14 DIAGNOSIS — J181 Lobar pneumonia, unspecified organism: Secondary | ICD-10-CM

## 2017-04-14 DIAGNOSIS — I10 Essential (primary) hypertension: Secondary | ICD-10-CM

## 2017-04-14 DIAGNOSIS — M150 Primary generalized (osteo)arthritis: Secondary | ICD-10-CM

## 2017-04-14 MED ORDER — METFORMIN HCL 500 MG PO TABS: 500.0000 mg | ORAL_TABLET | Freq: Two times a day (BID) | ORAL | 3 refills | Status: DC

## 2017-04-14 MED ORDER — NIACIN ER (ANTIHYPERLIPIDEMIC) 500 MG PO TBCR: 500.0000 mg | EXTENDED_RELEASE_TABLET | Freq: Every day | ORAL | 3 refills | Status: DC

## 2017-04-14 MED ORDER — FLUTICASONE PROPIONATE 50 MCG/ACT NA SUSP: 2.0000 | Freq: Every day | NASAL | 11 refills | Status: DC

## 2017-04-14 MED ORDER — PANTOPRAZOLE SODIUM 40 MG PO TBEC: 40.0000 mg | DELAYED_RELEASE_TABLET | Freq: Every day | ORAL | 3 refills | Status: DC

## 2017-04-14 MED ORDER — POLYETHYLENE GLYCOL 3350 17 GM/SCOOP PO POWD: ORAL | 5 refills | Status: DC

## 2017-04-14 MED ORDER — TAMSULOSIN HCL 0.4 MG PO CAPS: 0.4000 mg | ORAL_CAPSULE | Freq: Two times a day (BID) | ORAL | 3 refills | Status: DC

## 2017-04-14 MED ORDER — ATENOLOL 25 MG PO TABS: 25.0000 mg | ORAL_TABLET | Freq: Every day | ORAL | 3 refills | Status: DC

## 2017-04-14 MED ORDER — LOSARTAN POTASSIUM 50 MG PO TABS: 50.0000 mg | ORAL_TABLET | Freq: Every day | ORAL | 3 refills | Status: DC

## 2017-04-14 MED ORDER — FLUTICASONE-SALMETEROL 115-21 MCG/ACT IN AERO: 1.0000 | INHALATION_SPRAY | Freq: Two times a day (BID) | RESPIRATORY_TRACT | 11 refills | Status: DC

## 2017-04-14 NOTE — Patient Instructions (Signed)
Today we updated your med list in our EPIC system...    Continue your current medications the same...  We refilled your meds per request...  Keep up the good work w/ diet, exercise, etc...  Call for any questions...  Let's plan a follow up visit in 23mo, sooner if needed for problems.Marland KitchenMarland Kitchen

## 2017-04-28 DIAGNOSIS — J019 Acute sinusitis, unspecified: Secondary | ICD-10-CM | POA: Diagnosis not present

## 2017-04-28 DIAGNOSIS — J4540 Moderate persistent asthma, uncomplicated: Secondary | ICD-10-CM | POA: Diagnosis not present

## 2017-04-28 DIAGNOSIS — J3089 Other allergic rhinitis: Secondary | ICD-10-CM | POA: Diagnosis not present

## 2017-05-11 ENCOUNTER — Other Ambulatory Visit: Payer: Self-pay | Admitting: Pulmonary Disease

## 2017-05-13 ENCOUNTER — Encounter (INDEPENDENT_AMBULATORY_CARE_PROVIDER_SITE_OTHER): Payer: Self-pay | Admitting: Orthopedic Surgery

## 2017-05-13 ENCOUNTER — Ambulatory Visit (INDEPENDENT_AMBULATORY_CARE_PROVIDER_SITE_OTHER): Payer: Medicare Other | Admitting: Orthopedic Surgery

## 2017-05-13 ENCOUNTER — Ambulatory Visit (INDEPENDENT_AMBULATORY_CARE_PROVIDER_SITE_OTHER): Payer: Medicare Other

## 2017-05-13 DIAGNOSIS — M1611 Unilateral primary osteoarthritis, right hip: Secondary | ICD-10-CM | POA: Diagnosis not present

## 2017-05-13 DIAGNOSIS — M25551 Pain in right hip: Secondary | ICD-10-CM | POA: Diagnosis not present

## 2017-05-13 DIAGNOSIS — I2510 Atherosclerotic heart disease of native coronary artery without angina pectoris: Secondary | ICD-10-CM | POA: Diagnosis not present

## 2017-05-13 DIAGNOSIS — M5136 Other intervertebral disc degeneration, lumbar region: Secondary | ICD-10-CM

## 2017-05-13 MED ORDER — PREDNISONE 10 MG PO TABS: 20.0000 mg | ORAL_TABLET | Freq: Every day | ORAL | 0 refills | Status: DC

## 2017-05-13 NOTE — Progress Notes (Signed)
Office Visit Note   Patient: Jesse Santos           Date of Birth: 1935-12-30           MRN: [id number] Visit Date: 05/13/2017              Requested by: Noralee Space, MD [address] [place], [place] [address] PCP: Noralee Space, MD  Chief Complaint  Patient presents with  . Right Hip - Pain      HPI: Patient presents complaining of a 2 day history of hip pain posteriorly which extends into the groin region. Patient also complains of right-sided lumbar spine pain which was worse with sitting and lying down. Patient states the tramadol provides temporary relief. Patient states he is currently on methotrexate treated at [facility] for lichen planus. Patient states the back pain and hip pain woke him up at night. He states the pain comes and goes.  Assessment & Plan: Visit Diagnoses:  1. Pain in right hip   2. Unilateral primary osteoarthritis, right hip   3. Other intervertebral disc degeneration, lumbar region     Plan: We'll start him on a low dose prednisone 20 mg with breakfast and wean off as his symptoms allow. Patient states that he has taken prednisone in the past without problems. Denies allergies. Will follow up in 3 weeks and reevaluate the lumbar spine and hip. If his symptoms in the back are still present we could obtain an MRI scan to further evaluate the lumbar spine. Pain would have him follow up with Dr. Ninfa Linden for evaluation for total hip arthroplasty.  Follow-Up Instructions: Return in about 3 weeks (around 06/03/2017).   Ortho Exam  Patient is alert, oriented, no adenopathy, well-dressed, normal affect, normal respiratory effort. Examination patient does have an antalgic gait specific abductor lurch. Patient has slight decreased range of motion in the right hip compared to the left hip however range of motion does reproduce his groin pain. He has a negative straight leg raise no focal motor weakness good strength with hip flexion and plantar flexion  dorsiflexion and knee flexion and extension.  Imaging: Xr Hip Unilat W Or W/o Pelvis 2-3 Views Right  Result Date: 05/13/2017 2 view radiographs of the right hip shows some mild joint space narrowing there is subchondral sclerosis and cystic changes.  Xr Lumbar Spine 2-3 Views  Result Date: 05/13/2017 Two-view radiographs of the lumbar spine shows bony spurs at all levels of the lumbar spine there is joint space narrowing there is a calcification of the aorta with maximum diameter is 25 mm.  No images are attached to the encounter.  Labs: Lab Results  Component Value Date   HGBA1C 6.7 (H) 10/15/2016   HGBA1C 6.7 (H) 04/13/2016   HGBA1C 6.8 (H) 10/15/2015   ESRSEDRATE 48 (H) 11/18/2016   ESRSEDRATE 24 (H) 07/11/2009   REPTSTATUS 11/13/2016 FINAL 11/12/2016   CULT MULTIPLE SPECIES PRESENT, SUGGEST RECOLLECTION (A) 11/12/2016    Orders:  Orders Placed This Encounter  Procedures  . XR HIP UNILAT W OR W/O PELVIS 2-3 VIEWS RIGHT  . XR Lumbar Spine 2-3 Views   Meds ordered this encounter  Medications  . predniSONE (DELTASONE) 10 MG tablet    Sig: Take 2 tablets (20 mg total) by mouth daily with breakfast. Wean off as symptoms resolve    Dispense:  60 tablet    Refill:  0     Procedures: No procedures performed  Clinical Data: No  additional findings.  ROS:  All other systems negative, except as noted in the HPI. Review of Systems  Objective: Vital Signs: There were no vitals taken for this visit.  Specialty Comments:  No specialty comments available.  PMFS History: Patient Active Problem List   Diagnosis Date Noted  . Unilateral primary osteoarthritis, right hip 05/13/2017  . Other intervertebral disc degeneration, lumbar region 05/13/2017  . Pain in right hip 05/13/2017  . Hemoptysis 11/13/2016  . Community acquired pneumonia of left lower lobe of lung ([place]) 11/12/2016  . Lichen planus 81/44/8185  . Type 2 diabetes mellitus without complication, without  long-term current use of insulin ([place]) 10/15/2015  . COPD (chronic obstructive pulmonary disease) with chronic bronchitis ([place]) 04/10/2015  . History of colonic polyps 12/28/2014  . Cellulitis 02/09/2013  . Obstruction to urinary outflow 01/02/2011  . Overweight(278.02) 11/11/2009  . GASTROPARESIS 04/15/2009  . CONSTIPATION 04/15/2009  . CORONARY ATHEROSCLEROSIS NATIVE CORONARY ARTERY 02/15/2009  . GASTROENTERITIS 01/01/2009  . Benign prostatic hyperplasia with urinary obstruction 07/29/2008  . MIXED HYPERLIPIDEMIA 07/10/2008  . Osteoarthritis 07/10/2008  . HEARING LOSS 01/11/2008  . Diverticulosis of large intestine 01/11/2008  . COLONIC POLYPS 07/22/2007  . ANEMIA 07/22/2007  . Anxiety state 07/22/2007  . Obstructive sleep apnea 07/22/2007  . Essential hypertension 07/22/2007  . Allergic rhinitis 07/22/2007  . COPD exacerbation ([place]) 07/22/2007  . GERD 07/22/2007  . IRRITABLE BOWEL SYNDROME 07/22/2007  . BACK PAIN, LUMBAR 07/22/2007  . RENAL CALCULUS, HX OF 07/22/2007   Past Medical History:  Diagnosis Date  . Allergic rhinitis, cause unspecified   . Anemia   . Anxiety   . Benign neoplasm of colon   . BPH (benign prostatic hypertrophy)   . COPD (chronic obstructive pulmonary disease) ([place])   . Coronary artery disease    Coronary calcification with negative ETT 2010;   ETT-Myoview (02/2014):  Normal stress nuclear study. No evidence of ischemia.  LV Ejection Fraction: 69%  . Degenerative joint disease   . Diverticulosis of colon (without mention of hemorrhage)   . Esophageal reflux   . Hearing loss   . Hyperplastic colon polyp 02/12/2011  . Irritable bowel syndrome   . Lumbar back pain   . Mixed hyperlipidemia   . Obstructive sleep apnea (adult) (pediatric)   . Oral lichen planus   . Renal calculus   . Type II or unspecified type diabetes mellitus without mention of complication, not stated as uncontrolled   . Unspecified essential hypertension     Family History    Problem Relation Age of Onset  . Heart disease Father 29  . Hypertension Brother   . Stroke Mother 37  . Diabetes Mother   . Colon cancer Neg Hx     Past Surgical History:  Procedure Laterality Date  . CATARACT EXTRACTION    . CHOLECYSTECTOMY    . KNEE SURGERY  11/10  . SKIN CANCER EXCISION  3/10   left hand    Social History   Occupational History  . retired     Nurse, mental health order   Social History Main Topics  . Smoking status: Former Smoker    Packs/day: 1.00    Years: 25.00    Types: Cigarettes    Quit date: 09/15/1979  . Smokeless tobacco: Former Systems developer    Quit date: 09/15/1979  . Alcohol use No  . Drug use: No  . Sexual activity: Not on file

## 2017-05-20 ENCOUNTER — Telehealth: Payer: Self-pay | Admitting: Pulmonary Disease

## 2017-05-20 MED ORDER — LEVOFLOXACIN 500 MG PO TABS: 500.0000 mg | ORAL_TABLET | Freq: Every day | ORAL | 0 refills | Status: DC

## 2017-05-20 NOTE — Telephone Encounter (Signed)
Called and spoke with pt and he stated that he was seen by Dr. Fredderick Phenix on 8/15 and given cefdinir and finished this on 8/25 with little help.  Seen Dr. Sharol Given on 8/30 and given pred taper that he is still taking.  He is having sinus pressure and chest congestion with light yellow sputum, using mucinex BID, flonase, claritin and today having some chest tightness and some wheezing in his right lung.  Pt wanted to see if a stronger abx could be called in for him.  SN please advise. Thanks

## 2017-05-20 NOTE — Telephone Encounter (Signed)
Called and spoke with pt and he is aware of SN recs for the levaquin and to complete the pred taper that was given by Dr. Sharol Given.  He is aware that the next step if not better will be ENT eval.  Nothing futher is needed.

## 2017-05-22 IMAGING — DX DG CHEST 2V
2 series · 2 of 2 positions shown · non-contrast
Comparison: October 10, 2014

CLINICAL DATA: Two day history of cough and congestion

EXAM:
CHEST  2 VIEW

[chest pa]
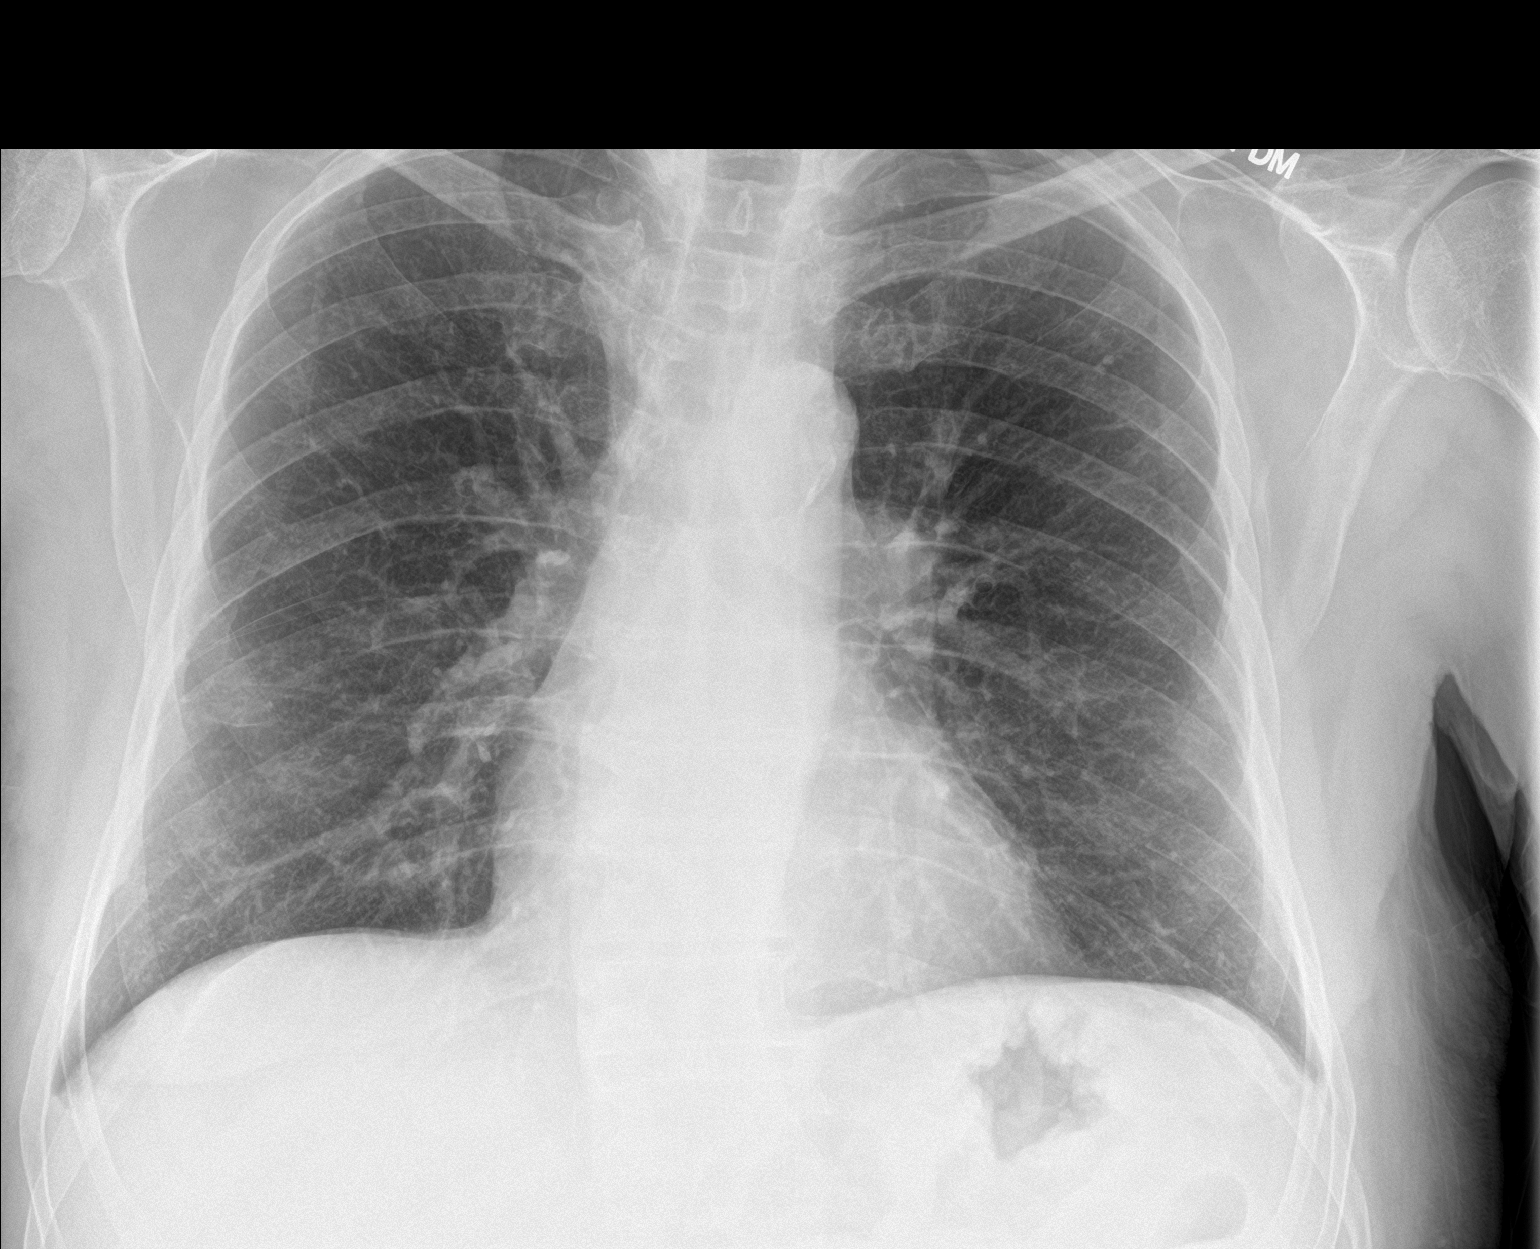

[chest lat]
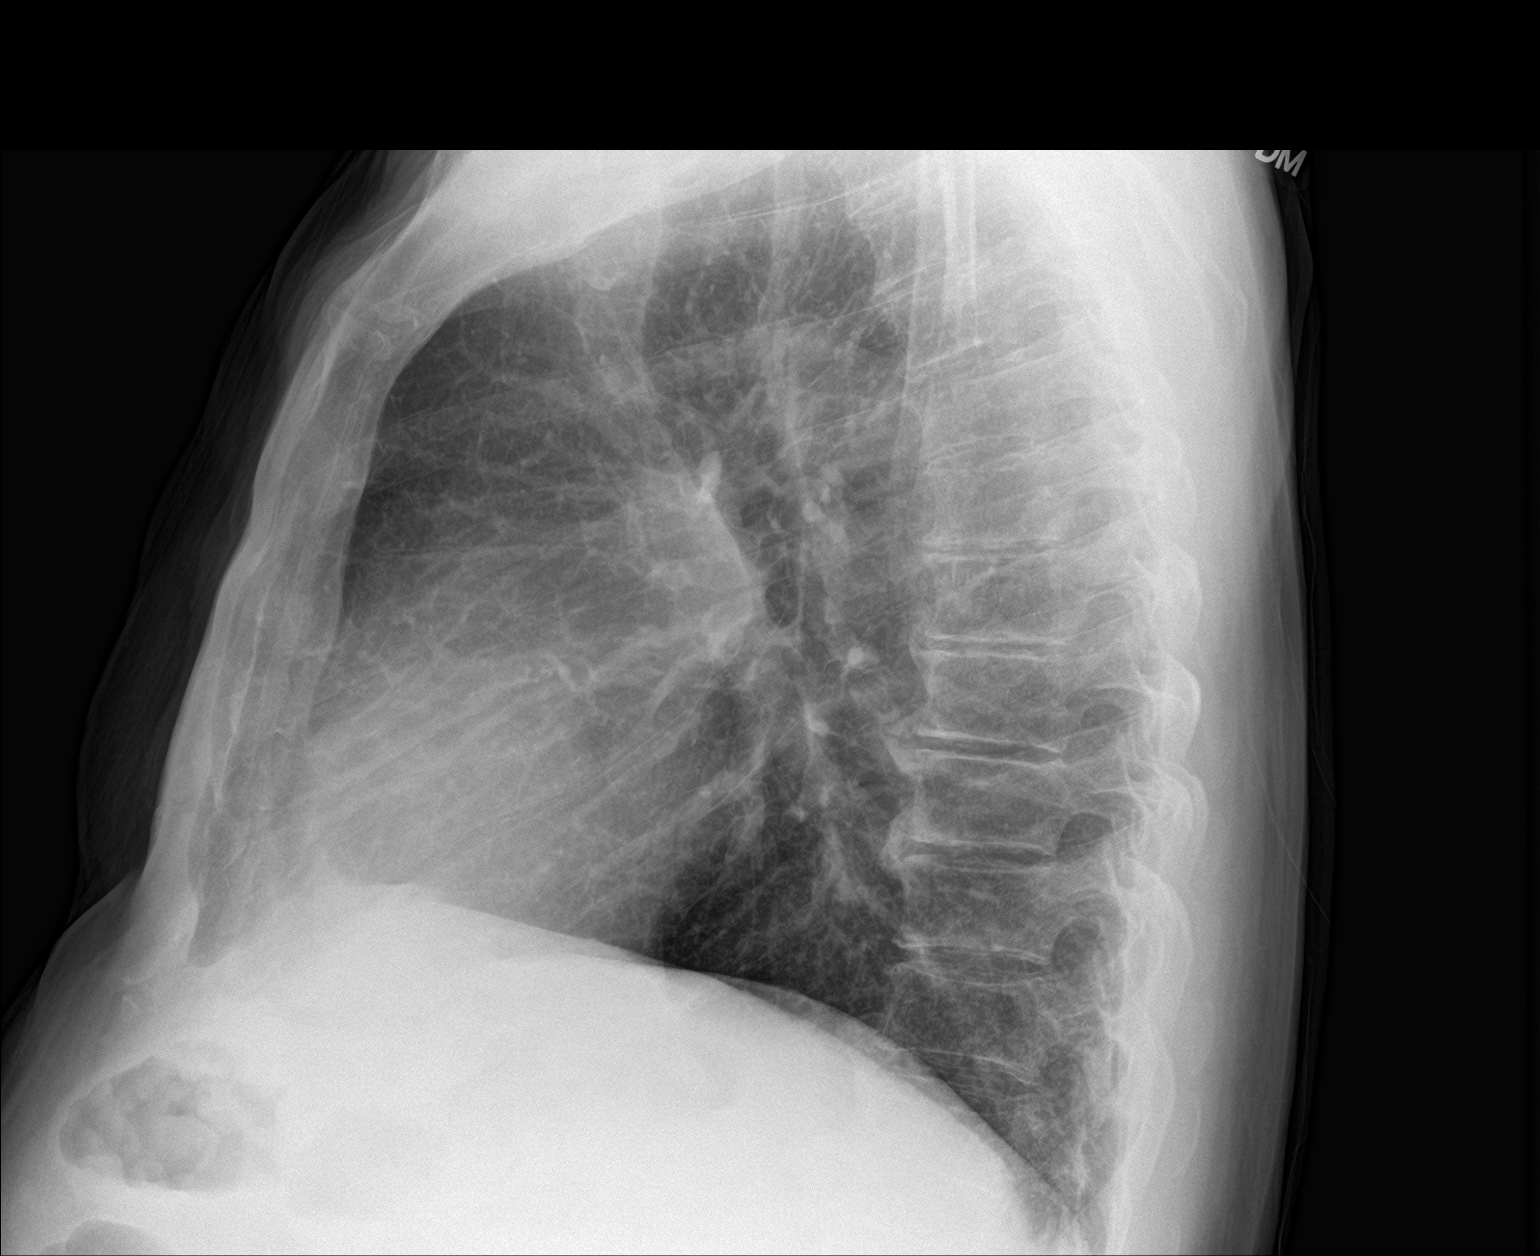

[2 of 2 positions shown; findings below may reference images not displayed]

FINDINGS: There is no edema or consolidation. The heart size and pulmonary
vascularity are normal. No adenopathy. There is atherosclerotic
calcification in the aorta. There is lower thoracic dextroscoliosis.
There is degenerative change in the thoracic spine.
IMPRESSION: Aortic atherosclerosis.  No edema or consolidation.

## 2017-05-27 ENCOUNTER — Ambulatory Visit (INDEPENDENT_AMBULATORY_CARE_PROVIDER_SITE_OTHER): Payer: Medicare Other | Admitting: Pulmonary Disease

## 2017-05-27 ENCOUNTER — Other Ambulatory Visit (INDEPENDENT_AMBULATORY_CARE_PROVIDER_SITE_OTHER): Payer: Medicare Other

## 2017-05-27 ENCOUNTER — Ambulatory Visit (INDEPENDENT_AMBULATORY_CARE_PROVIDER_SITE_OTHER)
Admission: RE | Admit: 2017-05-27 | Discharge: 2017-05-27 | Disposition: A | Discharge status: Home / Self Care | Payer: Medicare Other | Source: Ambulatory Visit | Attending: Pulmonary Disease | Admitting: Pulmonary Disease

## 2017-05-27 VITALS — BP 134/62 | HR 82 | Temp 98.4°F | Wt 214.5 lb

## 2017-05-27 DIAGNOSIS — I2510 Atherosclerotic heart disease of native coronary artery without angina pectoris: Secondary | ICD-10-CM | POA: Diagnosis not present

## 2017-05-27 DIAGNOSIS — J449 Chronic obstructive pulmonary disease, unspecified: Secondary | ICD-10-CM | POA: Diagnosis not present

## 2017-05-27 DIAGNOSIS — E119 Type 2 diabetes mellitus without complications: Secondary | ICD-10-CM | POA: Diagnosis not present

## 2017-05-27 DIAGNOSIS — F411 Generalized anxiety disorder: Secondary | ICD-10-CM

## 2017-05-27 DIAGNOSIS — L439 Lichen planus, unspecified: Secondary | ICD-10-CM | POA: Diagnosis not present

## 2017-05-27 DIAGNOSIS — Z8701 Personal history of pneumonia (recurrent): Secondary | ICD-10-CM

## 2017-05-27 DIAGNOSIS — J0141 Acute recurrent pansinusitis: Secondary | ICD-10-CM | POA: Diagnosis not present

## 2017-05-27 DIAGNOSIS — I10 Essential (primary) hypertension: Secondary | ICD-10-CM

## 2017-05-27 DIAGNOSIS — R0602 Shortness of breath: Secondary | ICD-10-CM | POA: Diagnosis not present

## 2017-05-27 DIAGNOSIS — J329 Chronic sinusitis, unspecified: Secondary | ICD-10-CM | POA: Insufficient documentation

## 2017-05-27 DIAGNOSIS — K3184 Gastroparesis: Secondary | ICD-10-CM

## 2017-05-27 LAB — COMPREHENSIVE METABOLIC PANEL
ALT: 13 U/L (ref 0–53)
AST: 14 U/L (ref 0–37)
Albumin: 4 g/dL (ref 3.5–5.2)
Alkaline Phosphatase: 79 U/L (ref 39–117)
BILIRUBIN TOTAL: 0.4 mg/dL (ref 0.2–1.2)
BUN: 18 mg/dL (ref 6–23)
CALCIUM: 9.8 mg/dL (ref 8.4–10.5)
CO2: 25 meq/L (ref 19–32)
Chloride: 105 mEq/L (ref 96–112)
Creatinine, Ser: 0.87 mg/dL (ref 0.40–1.50)
GFR: 89.81 mL/min (ref >60.00)
GLUCOSE: 171 mg/dL — AB (ref 70–99)
POTASSIUM: 4.7 meq/L (ref 3.5–5.1)
Sodium: 138 mEq/L (ref 135–145)
Total Protein: 6.6 g/dL (ref 6.0–8.3)

## 2017-05-27 LAB — SEDIMENTATION RATE: Sed Rate: 16 mm/hr (ref 0–20)

## 2017-05-27 LAB — CBC WITH DIFFERENTIAL/PLATELET
BASOS ABS: 0.1 10*3/uL (ref 0.0–0.1)
Basophils Relative: 0.7 % (ref 0.0–3.0)
EOS PCT: 0.9 % (ref 0.0–5.0)
Eosinophils Absolute: 0.1 10*3/uL (ref 0.0–0.7)
HEMATOCRIT: 40 % (ref 39.0–52.0)
Hemoglobin: 13.1 g/dL (ref 13.0–17.0)
LYMPHS ABS: 0.7 10*3/uL (ref 0.7–4.0)
LYMPHS PCT: 6 % — AB (ref 12.0–46.0)
MCHC: 32.9 g/dL (ref 30.0–36.0)
MCV: 93.1 fl (ref 78.0–100.0)
MONOS PCT: 5.6 % (ref 3.0–12.0)
Monocytes Absolute: 0.6 10*3/uL (ref 0.1–1.0)
NEUTROS ABS: 9.5 10*3/uL — AB (ref 1.4–7.7)
NEUTROS PCT: 86.8 % — AB (ref 43.0–77.0)
Platelets: 178 10*3/uL (ref 150.0–400.0)
RBC: 4.3 Mil/uL (ref 4.22–5.81)
RDW: 14 % (ref 11.5–15.5)
WBC: 11 10*3/uL — ABNORMAL HIGH (ref 4.0–10.5)

## 2017-05-27 MED ORDER — AMOXICILLIN-POT CLAVULANATE 875-125 MG PO TABS: 1.0000 | ORAL_TABLET | Freq: Two times a day (BID) | ORAL | 0 refills | Status: DC

## 2017-05-27 MED ORDER — METHYLPREDNISOLONE ACETATE 80 MG/ML IJ SUSP
80.0000 mg | Freq: Once | INTRAMUSCULAR | Status: AC
  Administered 2017-05-27: 80 mg via INTRAMUSCULAR

## 2017-05-27 NOTE — Patient Instructions (Signed)
Today we updated your med list in our EPIC system...    Continue your current medications the same...  We decided to check into this a little further>    Collect a sputum specimen for culture to check for resistent germs...    Check a follow up CXR to be sure there is no recurrent pneumonia...    Check follow up ALBS... We will contact you w/ the results when available...   In the meanwhile => wr gave you a Depo shot today...   Continue the Prednisone from [facility]...   Start the AUGMENTIN antibiotic today - one tab twice daily for 10d...   Continue the OCEAN spray, FLONASE twice daily, & CLARITIN daily...  REMEMBER>> while you are on the Augmentin take a probiotic like ALIGN daily & the activia yogurt...  Continue the MUCINEX 1200mg  twice daily & lots of fluids...  Check in w/ ENT regarding your ears (cerumen) & for a second opinion regarding your sinuses.Marland KitchenMarland Kitchen

## 2017-05-29 ENCOUNTER — Encounter: Payer: Self-pay | Admitting: Pulmonary Disease

## 2017-05-29 LAB — RESPIRATORY CULTURE OR RESPIRATORY AND SPUTUM CULTURE
MICRO NUMBER: 81012088
RESULT:: NORMAL
SPECIMEN QUALITY: ADEQUATE

## 2017-05-29 NOTE — Progress Notes (Signed)
Subjective:    Patient ID: Jesse Santos, male    DOB: Feb 05, 1936, 81 y.o.   MRN: [id number]  HPI 81 y/o WM w/ mult med problems here for a follow up visit>  ~  SEE PREV EPIC NOTES FOR OLDER DATA >>     LABS 1/14:  FLP- at goals on Niacin rx;  Chems- ok w/ BS=134, A1c=7.5;  CBC- wnl;  TSH=1.13;  VitD=42;  PSA=1.10      LABS 7/14:  Chems- ok x BS=140, A1c=7.3...   1/15> Jesse Santos reports that DrDuda plans a left THR (for avasc necreosis) whenever he is ready, but he's not rushing it, on Tramadol prn & doing satis for now...   LABS 1/15:  FLP- at goals on diet+Niaspan;  Chesm- ok w/ BS=129, A1c=7.0;  CBC- wnl;  TSH=1.06...  EKG 4/15 showed NSR, rate70, wnl, NAD...  Treadmill 6/15 (DrBrackbill) showed borderline ST segment changes w/ exercise & a run of atrial tachy reported...   Myoview 6/15 showed good exerc capacity, norm BP response, no CP, no evid ischemia, decr uptake inferiorly on rest images similar to 2013, EF=69%, norm wall motion...  LABS 7/15:  Chems- ok w/ BS=126, A1c=7.1.Marland KitchenMarland Kitchen rec to continue Metform500Bid & get on diet, incr exerc, get wt down!!!   ~  October 10, 2014:  69mo ROV & Jesse Santos indicates it took several wks to get over his URI/AB episode in Oct;      On Advair115-2spBid, Mucinex, flonase, claritin; he reports breathing is stable- min cough, no sput, stable DOE, no edema...     He remains on Niasp500/d; FLP 1/16 showed TChol 150, TG 101, HDL 45, LDL 85    He continues Metform500Bid for his DM; Labs 1/16 showed BS=133, A1c=7.2    He has some lower abd pain/ cramping & has used the Levsin w/ relief- c/w IBS, some diarrhea, nausea, groin discomfort; he had colonoscopy 2012 w/ 2 polyps removed & f/u planned 2017...     He sees DrDuda for Ortho & takes Tramadol50 aS NEEDED... We reviewed prob list, meds, xrays and labs> see below for updates >>   CXR 1/16 showed norm heart size, clear lungs, DJD in Tspine, NAD...  LABS 1/16:  FLP- at goals on Niasp;  Chems- ok w/ BS=133  A1c=7.2;  CBC- wnl;  TSH=1.59;  PSA=1.25...   ~  April 10, 2015:  62mo ROV & Jesse Santos reports stable w/ CC= arthritis pain in shoulders/ hips, he sees DrDuda, told he needs left THR but he is holding off, Tramadol helps;  We reviewed the following medical problems during today's office visit >>    HOH> he has bilat hearing aides...    AR> on allergy shots, OTC antihist, Saline, Flonase; gets occas bouts of sinusitis but overall improved & no recent infections reported...     OSA> Sleep Study was in 2/04 w/ RDI=45 & desat to 88% w/ mod snoring & leg jerks, but he states he never could use the CPAP effectively & states he sleeps fine now if he stays on his side; denies daytime sleepiness or other issues...    COPD> exsmoker quit 1981; on AdvairHFA115-2spBid, Mucinex prn; he doesn't like Pred Rx; he had Bronchitic exac treated 10/15 w/ Levaquin, Medrol, Mucinex, etc & improved; needs incr exercise program...    HBP> on Aten25, Losar100; BP=120/70, he denies CP, palpit, dizzy, SOB, edema, etc...    CAD> on ASA81; followed by DrHochrein- CT Abd 2010 showed coronary calcif, Treadmill was neg; Myoview 3/13 was neg,  wnl; last seen 4/15- no new symptoms, active w/ yard work, EKG showed wnl, they decided to do Treadmill (borderline ST depression), then Myoview 6/15 showed NEG- no ischemia, decr uptake inferiorly same as 2013, EF=69%, norm wall motion, low risk...    Hyperlipid> on Niacin'500mg'$ ; FLP 1/16 showed TChol 150, TG 101, HDL 45, LDL 85; continue same, get wt down...    DM> on Metform500Bid; wt=222#, BMI=29; Labs 7/16 showed BS=129, A1c=6.7; rec to continue same med, better diet, get wt down!..    GI- GERD, dysphagia, Divertics, IBS, Polyps> on Protonix40, Miralax, Levsin0.125 prn; followed by DrPerry & stable- last colon 5/12 w/ severe divertics & 2 sm adenomas removed...    GU- Kid stone, BPH> on Flomax0.4 & Cialis prn; PSA remains wnl (PSA 1/16= 1.25)...    DJD, LBP> left knee complaints w/ shots from DrDuda,  similar for left shoulder; hx bilat hip AVN noted on prev CT Abd but he has min complaints and uses Tylenol prn (holding off on surg)...    Anxiety> he does not want anxiolytic meds.[name] EXAM shows Afeb, VSS, O2sat=97%;  Heent- neg, mallampati2;  Chest- clear w/o w/r/r;  Heart- RR w/o m/r/g;  Abd- soft, neg, nontender;  Ext- neg w/o c/c/e;  Neuro- intact...  Labs 7/16>  Chems- ok w/ BS=129, A1c=6.7, Cr=0.83... IMP/PLAN>>  Jesse Santos is stable, meds refilled, he's already had the 2016 Flu vaccine & up to date;  We discussed diet/ exercise/ wt reduction... ROV in 80mo  ~  October 15, 2015:  620moOV & Jesse Santos saw TP 07/2015 for a lesion on tongue- sent to Derm DrLupton ?lichenoid mucositis (rx w/ several steroid shots & topical meds) & may need [facility] [employer]. He has mult somatic complaints> LBP, leg weakness, HAs, cough/ congestion/ clear mucous (yet he states exercise on treadmill for 3028mat 2.6mp29m.. We reviewed the following medical problems during today's office visit >>     HOH> he has bilat hearing aides...    AR> on allergy shots per DrVanWinkle, OTC antihist, Saline, Flonase; gets occas bouts of sinusitis but overall improved & no recent infections reported...     OSA> Sleep Study 2/04 w/ RDI=45 & desat to 88% w/ mod snoring & leg jerks, but he states he never could use the CPAP effectively & states he sleeps fine now if he stays on his side; denies daytime sleepiness or other issues...    COPD> exsmoker quit 1981; on AdvairHFA115-2spBid, Mucinex prn; he doesn't like Pred Rx; prev Bronchitic exac treated w/ Levaquin, Medrol, Mucinex & improved; needs incr exercise program...    HBP> on Aten25, Losar100; BP=126/70, he denies CP, palpit, dizzy, SOB, edema, etc...    CAD> on ASA81; followed by DrHochrein- CT Abd 2010 showed coronary calcif, Treadmill was neg; Myoview 3/13 was neg, wnl; last seen 4/15- no new symptoms, active w/ yard work, EKG showed wnl, they decided to do Treadmill (borderline ST  depression), then Myoview 6/15 showed NEG- no ischemia, decr uptake inferiorly same as 2013, EF=69%, norm wall motion, low risk...    Hyperlipid> on Niacin'500mg'$ ; FLP 1/17 showed TChol 132, TG 52, HDL 46, LDL 75; continue same, get wt down...    DM> on Metform500Bid; wt=220#, BMI=29; Labs 7/16 showed BS=129, A1c=6.7; rec to continue same med, better diet, get wt down; Labs 1/17 showed BS=134, A1c=6.8...  Marland KitchenMarland KitchenGI- GERD, dysphagia, Divertics, IBS, Polyps> on Protonix40, Miralax, Levsin0.125 prn; followed by DrPerry & stable- last colon 5/12 w/ severe divertics & 2 sm adenomas  removed...    GU- Kid stone, BPH> on Flomax0.4 & Cialis prn; PSA remains wnl (PSA 1/16= 1.25, 1/17=1.06)...    DJD, LBP> left knee complaints w/ shots from DrDuda, similar for left shoulder; hx bilat hip AVN noted on prev CT Abd but he has min complaints and uses Tylenol prn (holding off on surg)...    Anxiety> he does not want anxiolytic meds.[name] EXAM shows Afeb, VSS, O2sat=98%;  Heent- neg, mallampati2;  Chest- clear w/o w/r/r;  Heart- RR w/o m/r/g;  Abd- soft, neg, nontender;  Ext- neg w/o c/c/e;  Neuro- intact...  LABS 10/15/15>  FLP- at goals on diet + niacin;  Chems- ok x BS=134, A1c=6.8 on Metform500bid;  CBC- wnl;  TSH=1.08;  PSA=1.06 IMP/PLAN>>  Jesse Santos has mult somatic complaints but objectively in good shape; we discussed ENT 2nd opinion re tongue lesion vs [facility] referral per Derm, he will decide; continue present Rx & ROV 34mo..  ~  April 13, 2016:  667moOV and after the last visit Jesse Santos saw DrShoemaker, ENT 10/29/15 for the lesion on his tongue- his note is reviewed in Care Everywhere, he concurred w/ topical steroid therapy for this chronic tongue irritation & gave him Triamcinolone cream;  DrLupton [facility] 04/01/16 7 note reviewed, bx proven lichenoid dermatitis, exam showed large erosions on lat border of tongue c/w oral erosive lichen planus; He rec Diflucan, Mycelex (for prob candida) &  Tacrolimus (Prograf) to dissolve in water & swish as directed=> pt indicates that he is improved, and they are planning f/u eval... we reviewed the following medical problems during today's office visit >>     HOH> he has bilat hearing aides & has been checked by DrShoemaker.    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    AR> on allergy shots per DrVanWinkle, OTC antihist, Saline, Flonase; gets occas bouts of sinusitis but overall improved & no recent infections reported...     OSA> Sleep Study 2/04 w/ RDI=45 & desat to 88% w/ mod snoring & leg jerks, but intol to CPAP & never used; states he sleeps fine now if he stays on his side; denies daytime sleepiness or other issues, naps 1/7...    COPD> exsmoker quit 1981; on AdvairHFA115-2spBid w/ spacer, Mucinex prn; he doesn't like Pred Rx; prev Bronchitic exac treated w/ antibiotics (Levaquin/ Augmentin), Medrol, Mucinex & improved; needs incr exercise program...    HBP> on Aten25, Losar100; BP=142/68, he denies CP, palpit, dizzy, SOB, edema, etc...    CAD> on ASA81; followed by DrHochrein- seen 12/19/15, CT Abd 2010 showed coronary calcif, Treadmill was neg (borderline ST depression); Myoview 3/13 was neg, wnl & Myoview 6/15 showed NEG- no ischemia, decr uptake inferiorly same as 2013, EF=69%, norm wall motion, low risk... He denies CP, palpit, dizzy, SOB, edema, etc; he remains active w/ yard work etc...    Hyperlipid> on Niacin50026mFLP 1/17 showed TChol 132, TG 52, HDL 46, LDL 75; continue same, get wt down...    DM> on Metform500Bid; wt=218#, BMI=29; Labs 7/16 showed BS=129, A1c=6.7; rec to continue same med, better diet, get wt down; Labs 1/17 showed BS=134, A1c=6.8 and A1c 04/13/16= 6.7, stable...    GI- GERD, dysphagia, Divertics, IBS, Polyps> on Protonix40, Miralax, Levsin0.125 prn; followed by DrPerry & stable- last colon 5/12 w/ severe divertics & 2 sm adenomas  removed...    GU- Kid stone, BPH> on Flomax0.4 &  Cialis prn; PSA remains wnl (PSA 1/16= 1.25, 1/17=1.06)...    DJD, LBP> left knee complaints w/ shots from DrDuda, similar for left shoulder; hx bilat hip AVN noted on prev CT Abd but he has min complaints and uses Tylenol prn (holding off on surg)...    Anxiety> he does not want anxiolytic meds.[name] EXAM shows Afeb, VSS, O2sat=98%;  Heent- neg, mallampati2;  Chest- clear w/o w/r/r;  Heart- RR w/o m/r/g;  Abd- soft, neg, nontender;  Ext- neg w/o c/c/e;  Neuro- intact...  LABS 04/13/16:  BMet- ordered but not done;  A1c=6.7.Marland KitchenMarland Kitchen IMP/PLAN>>  Jesse Santos is stable w/ his mult medical issues as above- oral lichen planus improved on regimen from Derm at [facility]; BP controlled, he remains active w/o angina, FLP & DM well regualted=> continue same meds.  ~  October 15, 2016:  41moROV & Jesse Santos was seen by SG 07/2016 & TP 08/2016 w/ cough prod of green mucus, sinus congestion, but no f/c/s; he was placed on MTX by DrJorizzo, [facility] due to his tongue lesion felt to be oral lichen planus, 2=> incr to 3 tabs once per week along w/ Folate, Mycelex and Prograf (1 cap dissolved in water & swished);  He remains on AdvairHFA-2spBid & he's careful to rinse after these treatments too;  He was treated w/ Levaquin + Pred taper and he followed up w/ TP 09/08/16-- improved w/ decr cough & dyspnea, no wheezing, remains on Advair, flonase, claritin, mucinex... we reviewed the following medical problems during today's office visit >>     HOH> he has bilat hearing aides & has been checked by DrShoemaker.    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    AR> on allergy shots per DrVanWinkle, OTC antihist, Saline, Flonase; gets occas bouts of sinusitis but overall improved & no recent infections reported...     OSA> Sleep Study 2/04 w/ RDI=45 & desat to 88% w/ mod  snoring & leg jerks, but intol to CPAP & never used; states he sleeps fine now if he stays on his side; denies daytime sleepiness or other issues, naps 1/7...    COPD> exsmoker quit 1981; on AdvairHFA115-2spBid w/ spacer, Mucinex prn; he doesn't like Pred Rx; prev Bronchitic exac treated w/ antibiotics (Levaquin/ Augmentin), Medrol, Mucinex & improved; needs incr exercise program...    HBP> on Aten25, Losar100; BP=116/60, he denies CP, palpit, dizzy, SOB, edema, etc...    CAD> on ASA81; followed by DrHochrein- seen 12/19/15, CT Abd 2010 showed coronary calcif, Treadmill was neg (borderline ST depression); Myoview 3/13 was neg, wnl & Myoview 6/15 showed NEG- no ischemia, decr uptake inferiorly same as 2013, EF=69%, norm wall motion, low risk... He denies CP, palpit, dizzy, SOB, edema, etc; he remains active w/ yard work etc...    Hyperlipid> on Niacin5039m FLP 1/17 showed TChol 132, TG 52, HDL 46, LDL 75; continue same, get wt down...    DM> on Metform500Bid; wt=216#, BMI=29; Labs 1/17 showed BS=134, A1c=6.8; rec to continue same med, better diet, get wt down; Labs 2/18 showed BS=136, A1c=6.7; stable...    GI- GERD, dysphagia, Divertics, IBS, Polyps> on Protonix40, Miralax, Levsin0.125 prn; followed by DrPerry & stable- last colon 5/12 w/ severe divertics & 2 sm adenomas removed; he wants off PPI=> change to PEPCID40...    GU- Kid stone, BPH> on Flomax0.4 & Cialis prn; PSA remains wnl (PSA 1/16= 1.25, 1/17=1.06, 2/18=1.45)...Marland KitchenMarland Kitchen  DJD, LBP> left knee complaints w/ shots from DrDuda, similar for left shoulder; hx bilat hip AVN noted on prev CT Abd but he has min complaints and uses Tylenol prn (holding off on surg)...    Anxiety> he does not want anxiolytic meds.[name] EXAM shows Afeb, VSS, O2sat=99%;  Wt=216#, 6'2"Tall, BMI=28;  Heent- neg, mallampati2;  Chest- clear w/o w/r/r;  Heart- RR w/o m/r/g;  Abd- soft, neg, nontender;  Ext- neg w/o c/c/e;  Neuro- intact...  CXR 08/10/16>  Norm heart size, atherosclerotic  Ao, clear lungs- NAD, degen changes in Tspine w/ some scoliosis...  LABS 10/15/16>  FLP- at goals on Niaspan;  Chems- ok w/ BS=136, A1c=6.7;  CBC- wnl w/ Hg=13.9, Fe=169 (42%sat), B12=504;  TSH=1.04,  PSA=1.45...  IMP/PLAN>>  Jesse Santos is still dealing w/ his oral prob= Lichen Planus & improved w/ treatment program under the direction of [facility] at [facility]- on MTX 3/wk, Prograf, Mycelex;  His wife is concerned about his being on Protonix40 for many yrs, followed by DrPerry w/ GERD/ dysphagia, last EGD 2009 was essentially normal- OK to stop the PPI & change to Pepcid40;  Continue other meds the same + diet/ exercise...   ~  November 18, 2016:  38moROV & post hospital visit> DMarden Noblewas hospitalized 3/1 - 11/15/16 by [facility] w/ multilobar community acquired pneumonia (predom lingular w/ CT abn in LLL & RLL as well);  He woke w/ cough, malaise, myalgias, & chills;  Temp in ER was 103, BP was wnl, O2sat was 94% on 2L/min, CXR showed LLL pneumonia, no organism specified, treated w/ rocephin & zithromax=> disch on oral zithromax & medrol taper...     Currently feeling better- sl sore throat, cough, sm amt beige sput, no hemoptysis, +SOB/DOE & still "weak", some chest discomfort=> we decided to f/u CXR/ Labs (see below)...    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    AR> on allergy shots per DrVanWinkle, OTC antihist, Saline, Flonase; gets occas bouts of sinusitis but overall improved & no recent infections reported...     OSA> Sleep Study 2/04 w/ RDI=45 & desat to 88% w/ mod snoring & leg jerks, but intol to CPAP & never used; states he sleeps fine now if he stays on his side; denies daytime sleepiness or other issues, naps 1/7...    COPD> exsmoker quit 1981; on AdvairHFA115-2spBid w/ spacer, Mucinex prn; he doesn't like Pred Rx; prev Bronchitic exac treated w/ antibiotics (Levaquin/ Augmentin),  Medrol, Mucinex & improved; needs incr exercise program... EXAM shows Afeb, VSS, O2sat=99%;  Wt=216#, 6'2"Tall, BMI=28;  Heent- neg, mallampati2;  Chest- clear w/o w/r/r;  Heart- RR w/o m/r/g;  Abd- soft, neg, nontender;  Ext- neg w/o c/c/e;  Neuro- intact...  CXR 11/12/16 showed LLL pneumonia, borderline cardiomegaly, aortic calcif  CT Chest 11/13/16>  Neg for PE, multilobar left lung pneumonia, poss early RLL infiltrate, calcif aortic & coronary arteries...  CXR today 11/18/16 showed norm heart size, aortic atherosclerosis, left lung opacity is resolving/ improved, no effusion etc...  LABS 11/2016>  Chems- ok x BS 110-190;  CBC- ok x WBC 14K=>9K  LABS 11/18/16>  Chems- ok w/ BS=110;  CBC- improved w/ Hg=13.7, WBC=8.5 IMP/PLAN>>  DMarden Noblehas improved from his recent bout of pneumonia (no organism identified); he has finished the antibiotics (Zithromax), & weaning off the Medrol;  We plan ROV recheck in 3-4weeks...   ~  December 09, 2016:  3wk Regal is improved overall "just weak" he says;  BP is good at home on Losar50 reading 130-140/ 60-70 at home;  He denis much cough, sput, no hemoptysis, no CP, SOB improved & now active on his treadmill, doing housework, etc;  His CC is nasal drainage;  F/u CXR today is resolved- NAD...     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    AR> on allergy shots per DrVanWinkle, OTC antihist, Saline, Flonase; gets occas bouts of sinusitis but overall improved & no recent infections reported...     OSA> Sleep Study 2/04 w/ RDI=45 & desat to 88% w/ mod snoring & leg jerks, but intol to CPAP & never used; states he sleeps fine now if he stays on his side; denies daytime sleepiness or other issues, naps 1/7...    COPD> exsmoker quit 1981; on AdvairHFA115-2spBid w/ spacer, Mucinex prn; he doesn't like Pred Rx; prev Bronchitic exac treated w/  antibiotics (Levaquin/ Augmentin), Medrol, Mucinex & improved; needs incr exercise program...    Medical Issues>  HBP, CAD, HL, DM, Gastroparesis, BPH, DJD, anxiety... EXAM shows Afeb, VSS, O2sat=98%;  Wt=211#, 6'2"Tall, BMI=28;  Heent- neg, mallampati2;  Chest- clear w/o w/r/r;  Heart- RR w/o m/r/g;  Abd- soft, neg, nontender;  Ext- neg w/o c/c/e;  Neuro- intact...  CXR 12/09/16 (independently reviewed by me in the PACS system) showed norm heart size, clear lungs- NAD... IMP/PLAN>>  Jesse Santos's pneumonia has resolved & he is approaching his baseline "just weak" he says;  Rec to continue his baseline regimen, diet, exercise, etc...    ~  April 14, 2017:  59mo ROV & Jesse Santos reports that his Lichen Planus (tongue soreness) is improved and he remains on Prograf & MTX from [facility];  He notes that his H2-blocker Pepcid isn't enough for his reflux & feels he needs to go back on Protonix40- ok (take it 2min before dinner);  Breathing is good but it is "dusty at my place" and this bothers his sinuses> prev on allergy shots for 22 yrs and DrESL stopped them when he retired, still uses [employer], denies SOB, CP, etc...  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    AR> prev on allergy shots per DrESL, OTC antihist, Saline, Flonase; gets occas bouts of sinusitis but overall improved but he's concerned not doing as well off the shots- asked to check in w/ DrVanWinkle.    OSA> Sleep Study 2/04 w/ RDI=45 & desat to 88% w/ mod snoring & leg jerks, but intol to CPAP & never used; states he sleeps fine now if he stays on his side; denies daytime sleepiness or other issues, naps 1/7...    COPD> exsmoker quit 1981; on AdvairHFA115-2spBid w/ spacer, Mucinex prn; he doesn't like Pred Rx; prev Bronchitic exac  treated w/ antibiotics (Levaquin/ Augmentin), Medrol, Mucinex & improved; needs incr exercise program...    Medical Issues>  HBP (controlled), CAD (DrHochrein last 4/17), HL (on Niaspan, FLP ok 2/18), DM (on Metform, Labs 2/18 w/ BS-167 A1c-6.7) , Gastroparesis, BPH, DJD, anxiety... EXAM shows Afeb, VSS, O2sat=99%;  Wt=215#, 6'2"Tall, BMI=28;  Heent- neg, mallampati2;  Chest- clear w/o  w/r/r;  Heart- RR w/o m/r/g;  Abd- soft, neg, nontender;  Ext- neg w/o c/c/e;  Neuro- intact... IMP/PLAN>>  OK back on Protonix40 taken 59min before dinner;  Continue same meds and check back w/ DrVanWinkle regarding allergy shots;  OK- refills today & we reviewed diet & exercise...         Problem List:  HEARING LOSS (ICD-389.9) - he has bilat hearing aides...  ALLERGIC RHINITIS (ICD-477.9) - on allergy shots from DrESL Gaetano Hawthorne)... plus Claritin, Saline, Mucinex, Flonase. ~  occas bouts of sinusitis requiring antibiotic Rx... ~  8/13: he had allergy f/u DrVanWinkle> skin testing 2010 pos to dust mites and molds; doing well on shots once/mo; stable & no changes made... ~  3/14: he presented w/ an upper resp infection & treated w/ Depo, Dosepak, Zithromax... ~  8/14:  He had allergy f/u DrVanWinkle> Advair, Flonase, Saline, Mucinex; he checked FENO- reported normal... ~  7/15: stable on allergy meds from DrVanWinkle, plus AdvairHFA115; he had sinus/ AB exac 5/15 treated by TP w/ ZPak=> Levaquin & resolved...  OBSTRUCTIVE SLEEP APNEA (ICD-327.23) - sleep study 8/04 showed RDI= 45 & desat to 88%... mod snoring and leg jerks without sleep disruption... CPAP perscribed but not using it now- states he can't sleep w/ it on & furthermore he is resting well as long as he stays on his side... denies daytime hypersomnolence & not interested in re-evaluation.  COPD (ICD-496) - ex-smoker, quit 1981 >>  ~  on ADVAIR 100Bid, PROAIR Prn, MUCINEX Bid... he had Pneumovax in 2008... doesn't like Pred Rx...  ~  10/11:  doing well  except recent cough, min green sputum (no change in dyspnea)- we will Rx w/ Augmentin, incr Mucinex/ Fluids. ~  4/12:  Breathing at baseline, continue maintenance meds... ~  CXR 12/13 showed normal heart size, clear lungs, NAD... ~  1/14:  He's had 2 exac this past yr- saw TP w/ Rx & improved... ~  1/15:  He continues to do well on Advair + allergy Rx from DrVanWinkle w/ Flonase, Saline, Mucinex... ~  7/15: exsmoker quit 1981; on AdvairHFA115-2spBid, Mucinex prn; he doesn't like Pred Rx; he had Bronchitic exac treated by TP 5/15 w/ ZPak, then Levaquin, Mucinex, etc & improved; needs incr exercise program... ~  10/15: seen w/ refractory bronchitic episode reqiring ZPak=>Levaquin, Medrol, Mucinex, Hydromet, etc... ~  1/16: He had a URI/ AB exac treated 10/15 w/ Levaquin/ Medrol/ Mucinex & resolved; now at baseline on Advair115-2spBid etc... ~  11/2016> he developed a LLL pneumonia (nos) & resolved w/ Rocephin/ Zithromax, Medrol, Nebs, etc...  HYPERTENSION (ICD-401.9) - prev on diet alone> then on Diovan320, but we decided to change to LOSARTAN 100mg /d 5/13 to save $$ ~  He claims that Norvasc & HCTZ "stopped my urine flow"... ~  10/11:  BP= 128/80 & even better at home he says... denies HA, fatigue, visual changes, CP, palipit, dizziness, syncope, dyspnea, edema, etc... ~  4/12:  Pt has seen TP, DrHochrein, & checking BP daily at home; tried on Norvasc but claims it decr his urine flow; now on Diovan & improved w/ BP= 132/70... ~  5/12:  BP remains well controlled on Diovan 320mg /d= 120/62 today. ~  11/12:  BP= 142/82 on Diovan160 now; denies CP, palpit, dizzy, SOB, edema... ~  5/13:  BP= 128/66 & he's requesting change to cheaper med; rec switch Diovan to LOSARTAN 100mg /d... ~  1/14: on Losar100; BP=140/84, he notes incr HR he says & we decided to add low  dose ATENOLOL25mg /d... ~  7/14: on Aten25, Losar100> BP=128/72 & he denies CP, palpit, SOB, edema, etc... ~  1/15: BP controlled on Aten25,  Losar100 w/ BP= 130/80 & he denies CP, palpit, SOB, edema. ~  7/15: on Aten25, Losar100; BP=124/70, he denies CP, palpit, dizzy, SOB, edema, etc. ~  10/15: on Aten25, Losar100; BP= 140/60 & remains stable hemodynamically... ~  BP remains under good control on same meds...  R/O CAD (ICD-414.00) - on ASA 81mg /d... Followed by DrHochrein & his notes are reviewed. ~  hx neg cardiac cath 1988 [place] DrJoe [place]... ~  NuclearStressTest 10/02 was norm- no scar or ischemia, EF=55%. ~  CT Abd 4/10 via ER showed coronary calcif... referred to Cards. ~  Eval by DrHochrein 6/10 showed norm EKG & cardiac exam... treadmill ordered. ~  Treadmill 6/10 showed reasonable exerc tolerance, no ischemic changes, few PVCs, sl incr BP... ~  EKG 3/13 showed NSR, rate76, WNL, NAD... ~  Myoview 3/13 was neg> exercised for 71min, stopped for fatigue & CP; no ST seg changes, occas PACs & PVCs; hypertensive BP response; no ischemia, EF=71%, normal wall motion... ~  He saw DrHochrein 4/14> hx CAD, doing satis on ASA & BP rx w/o angina & rec to follow program of aggressive risk factor reduction, no change in meds.  ~  EKG 4/14 showed NSR, rate60, wnl, NAD.. ~  6/15: .on ASA81; followed by DrHochrein- last seen 4/15- no new symptoms, active w/ yard work, EKG showed wnl, they decided to do Treadmill (borderline ST depression), then Myoview 6/15 showed NEG- no ischemia, decr uptake inferiorly same as 2013, EF=69%, norm wall motion, low risk...  MIXED HYPERLIPIDEMIA (ICD-272.2) - low HDL and NIASPAN 500mg /d started 4/09... ~  [place] 4/09 showed TChol 160, TG 106, HDL 26, LDL 113... rec- diet + Niaspan 500/d. ~  FLP 10/09 showed TChol 155, TG 78, HDL 33, LDL 106... ~  FLP 4/10 showed TChol 133, TG 63, HDL 30, LDL 91... rec> continue same. ~  FLP 4/11 showed TChol 144, TG 61, HDL 38, LDL 94 ~  FLP 10/11 showed TChol 155, TG 81, HDL 40, LDL 99 ~  FLP 4/12 showed TChol 142, TG 69, HDL 38, LDL 90 ~  FLP 11/12 showed TChol 165, TG 82, HDL  43, LDL 106 ~  FLP 5/13 in Niasp500 showed TChol 155, TG 72, HDL 46, LDL 95... rec change to generic. ~  FLP 1/14 on Niacin500 showed TChol 140, TG 56, HDL 39, LDL 90 ~  FLP 1/15 on Niaspan500 showed TChol 148, TG 57, HDL 43, LDL 94  ~  FLP 1/16 on Niaspan500 showed TChol 150, TG 101, HDL 45, LDL 85 ~  FLP 1/17 on Niaspan500 showed TChol 132, TG 52, HDL 46, LDL 75  DM (ICD-250.00) - on diet + METFORMIN 500mg Bid... he reports BS at home all in the 120-130's... ~  labs 4/09 showed BS= 132, HgA1c= 6.6.Marland KitchenMarland Kitchen rec- same med, better diet... ~  labs 10/09 showed BS= 131, HgA1c= 6.3.Marland KitchenMarland Kitchen ~  labs 4/10 showed BS= 117, HgA1c= 6.2.[name]. rec> same meds/ diet Rx. ~  labs 10/10 showed BS= 138, A1c= 6.5 ~  labs 4/11 showed BS= 121, A1c= 6.5.Marland KitchenMarland Kitchen On Metform500Bid + diet. ~  Dilated eye exam 5/11 by DrShapiro- no retinopathy... ~  labs 10/11 (wt=228#) showed BS= 118, A1c= 7.1.[name]. not as good- get on diet or more meds. ~  Labs 4/12 (wt=229#) showed BS= 119, A1c= 7.3... Wrong direction, may need more meds, get wt down! ~  5/12:  Ophthalmology check by DrShapiro was neg- no retinopathy... ~  Labs 11/12 showed BS= 136, A1c= 7.0 ~  Labs 5/13 on MetformBid showed BS= 132, A1c= 7.1.Marland KitchenMarland Kitchen Continue same, get wt down. ~  Ophthalmology check from DrShapiro 5/13> no DM retinopathy... ~  Labs 1/14 showed BS= 134, A1c= 7.5; not as good- needs better diet, get wt down, same meds for now... ~  Labs 7/14 on Metform500Bid showed BS= 140, A1c= 7.3  ~  Labs 1/154 on Metform500Bid showed BS= 129 & A1c= 7.0, and we reviewed meds/ diet/ exercise/ weight... ~  Labs 7/15 on Metform500Bid showed BS= 126, A1c= 7.1 & reminded of need for diet/ exerc/ wt reduction... ~  Labs 1/16 on Metform500Bid showed BS= 133, A1c= 7.2 ~  Labs 1/17 on Metform500Bid showed BS= 134, A1c= 6.8  GERD (ICD-530.81) & ? GASTROPARESIS - on PROTONIX 40mg /d, & off Reglan per DrPerry... ~   last EGD 6/07 by DrPerry showed gastic polyp... ~  8/10:  given Compazine suppos for  gastroparesis flair by DrGessner. ~  4/12:  C/o intermittent dysphagia & referred back to GI for further eval (colonoscopy due as well)...  DIVERTICULOSIS OF COLON (ICD-562.10) - he uses MIRALAX Bid regularly... IRRITABLE BOWEL SYNDROME (ICD-564.1) COLONIC POLYPS (ICD-211.3) ~  last colonoscopy 5/07 by DrSam showed divertics only... f/u 36yrs. ~  CT Abd 4/10 in ER showed atx bases, coronary calcif, s/p GB, abdAo calcif, 19mm renal stone, divertics, bilat hip AVN. ~  Follow up colonoscopy by DrPerry 5/12 showed severe diverticulosis, 2 sm polyps= tubular adenoma & f/u planned 5 yrs...  RENAL CALCULUS, HX OF (ICD-V13.01) BENIGN PROSTATIC HYPERTROPHY, HX OF (ICD-V13.8) - on FLOMAX 0.4mg /d w/ improved symptoms (he states flow better since stopping Vit E supplement). ~  4/12:  Routine PSA= 4.9 (it was 1.02 4/11) & we rx w/ Doxy Bid x14d, thewn plan recheck PSA==> 1.30  DEGENERATIVE JOINT DISEASE (ICD-715.90) - pt states "I have a bum knee" and eval by DrDuda w/ viscosupplementation shots... Note: Abd CT w/ bilat hip AVN noted... takes TYLENOL Prn... ~  11/10:  s/p left knee arthroscopy by DrDuda... ~  10/11:  s/p fall w/ left knee injury- s/p cortisone shot by DrDuda; hx shot in left shoulder too. ~  7/14: he is c/o incr pain in left hip area; known hx AVN & needs f/u by Ortho...  BACK PAIN, LUMBAR (ICD-724.2)  ANXIETY (ICD-300.00) - not currently on meds for nerves.  ANEMIA (ICD-285.9) - prev hx of GIB... ~  labs 4/09 showed Hg= 14.1 ~  labs 4/10 showed Hg= 13.6 ~  labs 4/11 showed Hg= 13.4 ~  Labs 4/12 showed Hg= 13.8 ~  Labs 3/13 showed Hg= 13.1 ~  Labs 1/14 showed Hg= 13.6 ~  Labs 1/15 showed Hg= 13.7 ~  Labs 1/16 showed Hg= 14.1 ~  CBC remains WNL...  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  Health Maintenance: ~  GI: followed by DrPerry> Colonoscopy 5/07 by DrSam... f/u planned 5 yrs. ~  GU:  See PSAs recorded above... ~  Immunizations: Pneumovax in 2008 @ age  72;  Sells given 7/15; Tetanus- given 4/10;  he gets yearly seasonal Flu vaccine.   Past Surgical History:  Procedure Laterality Date  . CATARACT EXTRACTION    . CHOLECYSTECTOMY    . KNEE SURGERY  11/10  . SKIN CANCER EXCISION  3/10   left hand     Outpatient Encounter Prescriptions as of 04/14/2017  Medication Sig  .  acetaminophen (TYLENOL) 500 MG tablet Take 500 mg by mouth every 6 (six) hours as needed for headache (pain).   [name] aspirin EC 81 MG tablet Take 81 mg by mouth at bedtime.  [name] atenolol (TENORMIN) 25 MG tablet Take 1 tablet (25 mg total) by mouth daily.  . Cholecalciferol (VITAMIN D) 2000 UNITS CAPS Take 2,000 Units by mouth daily.   . clotrimazole (MYCELEX) 10 MG troche Take 10 mg by mouth daily.   . COMPRO 25 MG suppository PLACE 1 SUPPOSITORY (25 MG TOTAL) RECTALLY EVERY 12 (TWELVE) HOURS AS NEEDED FOR NAUSEA.  [name] dextromethorphan-guaiFENesin (MUCINEX DM) 30-600 MG per 12 hr tablet Take 1 tablet by mouth every 12 (twelve) hours.  . fluticasone (FLONASE) 50 MCG/ACT nasal spray Place 2 sprays into both nostrils daily.  . fluticasone-salmeterol (ADVAIR HFA) 115-21 MCG/ACT inhaler Inhale 1 puff into the lungs 2 (two) times daily.  . folic acid (FOLVITE) 1 MG tablet Take 1 mg by mouth daily with lunch.   . hyoscyamine (LEVSIN SL) 0.125 MG SL tablet Place 1 tablet (0.125 mg total) under the tongue every 4 (four) hours as needed. As needed for abdominal cramping (Patient taking differently: Place 0.125 mg under the tongue every 4 (four) hours as needed (abdominal cramping). )  . lidocaine (ASPERCREME W/LIDOCAINE) 4 % cream Apply 1 application topically daily as needed (pain).   [name] loratadine (CLARITIN) 10 MG tablet Take 10 mg by mouth daily as needed for allergies.  [name] losartan (COZAAR) 50 MG tablet Take 1 tablet (50 mg total) by mouth daily.  . metFORMIN (GLUCOPHAGE) 500 MG tablet Take 1 tablet (500 mg total) by mouth 2 (two) times daily with a meal.  . methotrexate (RHEUMATREX) 2.5  MG tablet Take 3 tablets (7.5 mg total) by mouth See admin instructions. Take 3 tablets (7.5 mg) by mouth on Tuesdays with lunch, starting 11/24/2016  . Multiple Vitamins-Minerals (MULTIVITAMIN WITH MINERALS) tablet Take 1 tablet by mouth daily.    . niacin (NIASPAN) 500 MG CR tablet Take 1 tablet (500 mg total) by mouth at bedtime.  . nitroGLYCERIN (NITROSTAT) 0.4 MG SL tablet Place 1 tablet (0.4 mg total) under the tongue every 5 (five) minutes as needed for chest pain.  . ONE TOUCH ULTRA TEST test strip TEST BLOOD SUGAR ONCE A DAY AS DIRECTED (DX E11.9)  . polyethylene glycol powder (GLYCOLAX/MIRALAX) powder TAKE 17 GRAMS BY MOUTH TWICE DAILY AS NEEDED  . Probiotic Product (PROBIOTIC PO) Take 1 tablet by mouth daily.  . sodium chloride (OCEAN) 0.65 % nasal spray Place 1 spray into the nose 3 (three) times daily as needed for congestion.   . tacrolimus (PROGRAF) 1 MG capsule Take 1 mg by mouth See admin instructions. Dissolve 1 capsule (1 mg) in 500 ml of water and keep in refrigerator - swish and spit small amount twice daily  . tamsulosin (FLOMAX) 0.4 MG CAPS capsule Take 1 capsule (0.4 mg total) by mouth 2 (two) times daily.  . [DISCONTINUED] atenolol (TENORMIN) 25 MG tablet Take 25 mg by mouth daily.  . [DISCONTINUED] fluticasone (FLONASE) 50 MCG/ACT nasal spray Place 2 sprays into both nostrils daily. (Patient taking differently: Place 2 sprays into both nostrils at bedtime. )  . [DISCONTINUED] fluticasone-salmeterol (ADVAIR HFA) 115-21 MCG/ACT inhaler Inhale 1 puff into the lungs 2 (two) times daily.  . [DISCONTINUED] losartan (COZAAR) 50 MG tablet Take 50 mg by mouth daily.  . [DISCONTINUED] metFORMIN (GLUCOPHAGE) 500 MG tablet Take 1 tablet (500 mg total) by mouth 2 (  two) times daily with a meal.  . [DISCONTINUED] niacin (NIASPAN) 500 MG CR tablet Take 1 tablet (500 mg total) by mouth at bedtime.  . [DISCONTINUED] polyethylene glycol powder (GLYCOLAX/MIRALAX) powder TAKE 17 GRAMS BY MOUTH  TWICE DAILY AS NEEDED  . [DISCONTINUED] tamsulosin (FLOMAX) 0.4 MG CAPS capsule Take 1 capsule (0.4 mg total) by mouth 2 (two) times daily.  . [DISCONTINUED] traMADol (ULTRAM) 50 MG tablet TAKE 1 TABLET THREE TIMES A DAY AS NEEDED FOR PAIN (Patient taking differently: Take 50 mg by mouth 3 (three) times daily as needed (pain). )  . pantoprazole (PROTONIX) 40 MG tablet Take 1 tablet (40 mg total) by mouth daily.  . [DISCONTINUED] famotidine (PEPCID) 40 MG tablet Take 1 tablet (40 mg total) by mouth daily. (Patient not taking: Reported on 04/14/2017)  . [DISCONTINUED] levofloxacin (LEVAQUIN) 500 MG tablet Take 1 tablet (500 mg total) by mouth daily. (Patient not taking: Reported on 04/14/2017)  . [DISCONTINUED] methylPREDNISolone (MEDROL DOSEPAK) 4 MG TBPK tablet Take as directed per pak (Patient not taking: Reported on 04/14/2017)   No facility-administered encounter medications on file as of 04/14/2017.     Allergies  Allergen Reactions  . Prednisone Other (See Comments)    REACTION: high dose intolerance - causes numbness from waist down - low dose tapered course ok    Immunization History  Administered Date(s) Administered  . H1N1 08/21/2008  . Influenza Split 06/01/2011, 05/26/2012  . Influenza Whole 06/06/2008, 06/21/2009, 06/06/2010  . Influenza, High Dose Seasonal PF 07/03/2016  . Influenza,inj,Quad PF,6+ Mos 06/07/2013, 07/09/2014, 06/10/2015  . Pneumococcal Conjugate-13 04/05/2014  . Pneumococcal Polysaccharide-23 06/30/2006  . Zoster 09/14/2010    Current Medications, Allergies, Past Medical History, Past Surgical History, Family History, and Social History were reviewed in [employer] record.    Review of Systems         See HPI - all other systems neg except as noted... The patient complains of decreased hearing, dyspnea on exertion, headaches, and difficulty walking.  The patient denies anorexia, fever, weight loss, weight gain, vision loss, hoarseness,  chest pain, syncope, peripheral edema, prolonged cough, hemoptysis, abdominal pain, melena, hematochezia, severe indigestion/heartburn, hematuria, incontinence, muscle weakness, suspicious skin lesions, transient blindness, depression, unusual weight change, abnormal bleeding, enlarged lymph nodes, and angioedema.     Objective:   Physical Exam      WD, WN, 81 y/o WM in NAD... GENERAL:  Alert & oriented; pleasant & cooperative... HEENT:  [place]/AT, EOM-full, PERRLA, EACs-clear, TMs-wnl, NOSE- pale, congested, THROAT-clear, but lichenoid lesion on lat tongue. NECK:  Supple w/ fairROM; no JVD; normal carotid impulses w/o bruits; no thyromegaly or nodules palpated; no lymphadenopathy. CHEST:  Clear to P & A w/o wheezing, rhonchi, rales, or signs of consolidation... HEART:  Regular Rhythm; without murmurs/ rubs/ or gallops detected...  ABDOMEN:  Soft & nontender; normal bowel sounds; no organomegaly or masses palpated., no guarding, or rebound EXT: without deformities, mild arthritic changes; no varicose veins/ venous insuffic/ or edema. NEURO:  CN's intact;  no focal neuro deficits... DERM:  No lesions noted; no rash etc...  RADIOLOGY DATA:  Reviewed in the EPIC EMR & discussed w/ the patient...  LABORATORY DATA:  Reviewed in the EPIC EMR & discussed w/ the patient...   Assessment & Plan:    Lesion on tongue>. LICHEN PLANUS and he is improved on Rx from [place] (MTX, Mycelex, & Prograf=> dissolve in water/ swish/ & spit)...   Hx Acute Bronchitis> presented 10/15 w/ refractory  bronchitic episode; given ZPak=>Levaquin, Depo80/ Medrol dosepak, Mucinex, Hydromet, etc=> resolved... COPD exac>  He continues on Advair, Proair rescue, Mucinex; also Claritin, Flonase, Saline; stable continue same Rx... LLL Pneumonia 11/2016>  Hosp by [facility] & treated w/ Rocephin/Zithromax, Medrol taper, NEBS, etc & improved => resolved... 04/14/17>   OK back on Protonix40 taken 87min before dinner;  Continue  same meds and check back w/ DrVanWinkle regarding allergy shots;  OK- refills today & we reviewed diet & exercise.   HBP>  On LOSARTAN 100mg /d & [id number] w/ good control of BP, continue same...  R/O CAD>  Followed by DrHochrein, and doing satis w/ neg Myoview 6/15... continue ASA & risk factor reduction....  Lipids>  Looks satis on his diet + Niacin...  DM>  Control is fair w/ A1c stable at 6.7 he was told there are more meds in his future if he doesn't get his wt down (currently~225#)...  GI>  Per DrPerry w/ GERD, ?Gastroparesis, Divertics, IBS, Colon polyps> f/u colon 5/12 w/ divertics & 2 sm adenomas, repeat planned ~42yrs...  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  Anxiety>  Certainly an issue, he does not want anxiolytic Rx...   Patient's Medications  New Prescriptions   AMOXICILLIN-CLAVULANATE (AUGMENTIN) 875-125 MG TABLET    Take 1 tablet by mouth 2 (two) times daily.   PANTOPRAZOLE (PROTONIX) 40 MG TABLET    Take 1 tablet (40 mg total) by mouth daily.   PREDNISONE (DELTASONE) 10 MG TABLET    Take 2 tablets (20 mg total) by mouth daily with breakfast. Wean off as symptoms resolve  Previous Medications   ACETAMINOPHEN (TYLENOL) 500 MG TABLET    Take 500 mg by mouth every 6 (six) hours as needed for headache (pain).    ASPIRIN EC 81 MG TABLET    Take 81 mg by mouth at bedtime.   CHOLECALCIFEROL (VITAMIN D) 2000 UNITS CAPS    Take 2,000 Units by mouth daily.    CLOTRIMAZOLE (MYCELEX) 10 MG TROCHE    Take 10 mg by mouth daily.    COMPRO 25 MG SUPPOSITORY    PLACE 1 SUPPOSITORY (25 MG TOTAL) RECTALLY EVERY 12 (TWELVE) HOURS AS NEEDED FOR NAUSEA.   DEXTROMETHORPHAN-GUAIFENESIN (MUCINEX DM) 30-600 MG PER 12 HR TABLET    Take 1 tablet by mouth every 12 (twelve) hours.   FOLIC ACID (FOLVITE) 1 MG TABLET    Take 1 mg by mouth daily with lunch.    HYOSCYAMINE (LEVSIN SL) 0.125 MG SL TABLET    Place 1 tablet (0.125 mg total) under the tongue  every 4 (four) hours as needed. As needed for abdominal cramping   LIDOCAINE (ASPERCREME W/LIDOCAINE) 4 % CREAM    Apply 1 application topically daily as needed (pain).    LORATADINE (CLARITIN) 10 MG TABLET    Take 10 mg by mouth daily as needed for allergies.   METHOTREXATE (RHEUMATREX) 2.5 MG TABLET    Take 3 tablets (7.5 mg total) by mouth See admin instructions. Take 3 tablets (7.5 mg) by mouth on Tuesdays with lunch, starting 11/24/2016   MULTIPLE VITAMINS-MINERALS (MULTIVITAMIN WITH MINERALS) TABLET    Take 1 tablet by mouth daily.     NITROGLYCERIN (NITROSTAT) 0.4 MG SL TABLET    Place 1 tablet (0.4 mg total) under the tongue every 5 (five) minutes as needed for chest pain.   ONE TOUCH ULTRA TEST TEST STRIP    TEST BLOOD SUGAR ONCE  A DAY AS DIRECTED (DX E11.9)   PROBIOTIC PRODUCT (PROBIOTIC PO)    Take 1 tablet by mouth daily.   SODIUM CHLORIDE (OCEAN) 0.65 % NASAL SPRAY    Place 1 spray into the nose 3 (three) times daily as needed for congestion.    TACROLIMUS (PROGRAF) 1 MG CAPSULE    Take 1 mg by mouth See admin instructions. Dissolve 1 capsule (1 mg) in 500 ml of water and keep in refrigerator - swish and spit small amount twice daily  Modified Medications   Modified Medication Previous Medication   ATENOLOL (TENORMIN) 25 MG TABLET atenolol (TENORMIN) 25 MG tablet      Take 1 tablet (25 mg total) by mouth daily.    Take 25 mg by mouth daily.   FLUTICASONE (FLONASE) 50 MCG/ACT NASAL SPRAY fluticasone (FLONASE) 50 MCG/ACT nasal spray      Place 2 sprays into both nostrils daily.    Place 2 sprays into both nostrils daily.   FLUTICASONE-SALMETEROL (ADVAIR HFA) 115-21 MCG/ACT INHALER fluticasone-salmeterol (ADVAIR HFA) 115-21 MCG/ACT inhaler      Inhale 1 puff into the lungs 2 (two) times daily.    Inhale 1 puff into the lungs 2 (two) times daily.   LOSARTAN (COZAAR) 50 MG TABLET losartan (COZAAR) 50 MG tablet      Take 1 tablet (50 mg total) by mouth daily.    Take 50 mg by mouth daily.    METFORMIN (GLUCOPHAGE) 500 MG TABLET metFORMIN (GLUCOPHAGE) 500 MG tablet      Take 1 tablet (500 mg total) by mouth 2 (two) times daily with a meal.    Take 1 tablet (500 mg total) by mouth 2 (two) times daily with a meal.   NIACIN (NIASPAN) 500 MG CR TABLET niacin (NIASPAN) 500 MG CR tablet      Take 1 tablet (500 mg total) by mouth at bedtime.    Take 1 tablet (500 mg total) by mouth at bedtime.   POLYETHYLENE GLYCOL POWDER (GLYCOLAX/MIRALAX) POWDER polyethylene glycol powder (GLYCOLAX/MIRALAX) powder      TAKE 17 GRAMS BY MOUTH TWICE DAILY AS NEEDED    TAKE 17 GRAMS BY MOUTH TWICE DAILY AS NEEDED   TAMSULOSIN (FLOMAX) 0.4 MG CAPS CAPSULE tamsulosin (FLOMAX) 0.4 MG CAPS capsule      Take 1 capsule (0.4 mg total) by mouth 2 (two) times daily.    Take 1 capsule (0.4 mg total) by mouth 2 (two) times daily.   TRAMADOL (ULTRAM) 50 MG TABLET traMADol (ULTRAM) 50 MG tablet      TAKE ONE TABLET BY MOUTH THREE TIMES A DAY AS NEEDED FOR PAIN    TAKE 1 TABLET THREE TIMES A DAY AS NEEDED FOR PAIN  Discontinued Medications   FAMOTIDINE (PEPCID) 40 MG TABLET    Take 1 tablet (40 mg total) by mouth daily.   LEVOFLOXACIN (LEVAQUIN) 500 MG TABLET    Take 1 tablet (500 mg total) by mouth daily.   METHYLPREDNISOLONE (MEDROL DOSEPAK) 4 MG TBPK TABLET    Take as directed per pak

## 2017-05-29 NOTE — Progress Notes (Addendum)
Subjective:    Patient ID: Jesse Santos, male    DOB: [birth year], 81 y.o.   MRN: [id number]  HPI 82 y/o WM w/ mult med problems here for a follow up visit>  ~  SEE PREV EPIC NOTES FOR OLDER DATA >>     LABS 1/14:  FLP- at goals on Niacin rx;  Chems- ok w/ BS=134, A1c=7.5;  CBC- wnl;  TSH=1.13;  VitD=42;  PSA=1.10      LABS 7/14:  Chems- ok x BS=140, A1c=7.3...   1/15> Jesse Santos reports that DrDuda plans a left THR (for avasc necreosis) whenever he is ready, but he's not rushing it, on Tramadol prn & doing satis for now...   LABS 1/15:  FLP- at goals on diet+Niaspan;  Chesm- ok w/ BS=129, A1c=7.0;  CBC- wnl;  TSH=1.06...  EKG 4/15 showed NSR, rate70, wnl, NAD...  Treadmill 6/15 (DrBrackbill) showed borderline ST segment changes w/ exercise & a run of atrial tachy reported...   Myoview 6/15 showed good exerc capacity, norm BP response, no CP, no evid ischemia, decr uptake inferiorly on rest images similar to 2013, EF=69%, norm wall motion...  LABS 7/15:  Chems- ok w/ BS=126, A1c=7.1.Marland KitchenMarland Kitchen rec to continue Metform500Bid & get on diet, incr exerc, get wt down!!!   ~  October 10, 2014:  94mo ROV & Jesse Santos indicates it took several wks to get over his URI/AB episode in Oct;      On Advair115-2spBid, Mucinex, flonase, claritin; he reports breathing is stable- min cough, no sput, stable DOE, no edema...     He remains on Niasp500/d; FLP 1/16 showed TChol 150, TG 101, HDL 45, LDL 85    He continues Metform500Bid for his DM; Labs 1/16 showed BS=133, A1c=7.2    He has some lower abd pain/ cramping & has used the Levsin w/ relief- c/w IBS, some diarrhea, nausea, groin discomfort; he had colonoscopy 2012 w/ 2 polyps removed & f/u planned 2017...     He sees DrDuda for Ortho & takes Tramadol50 aS NEEDED... We reviewed prob list, meds, xrays and labs> see below for updates >>   CXR 1/16 showed norm heart size, clear lungs, DJD in Tspine, NAD...  LABS 1/16:  FLP- at goals on Niasp;  Chems- ok w/ BS=133  A1c=7.2;  CBC- wnl;  TSH=1.59;  PSA=1.25...   ~  April 10, 2015:  85mo ROV & Jesse Santos reports stable w/ CC= arthritis pain in shoulders/ hips, he sees DrDuda, told he needs left THR but he is holding off, Tramadol helps;  We reviewed the following medical problems during today's office visit >>    HOH> he has bilat hearing aides...    AR> on allergy shots, OTC antihist, Saline, Flonase; gets occas bouts of sinusitis but overall improved & no recent infections reported...     OSA> Sleep Study was in 2/04 w/ RDI=45 & desat to 88% w/ mod snoring & leg jerks, but he states he never could use the CPAP effectively & states he sleeps fine now if he stays on his side; denies daytime sleepiness or other issues...    COPD> exsmoker quit 1981; on AdvairHFA115-2spBid, Mucinex prn; he doesn't like Pred Rx; he had Bronchitic exac treated 10/15 w/ Levaquin, Medrol, Mucinex, etc & improved; needs incr exercise program...    HBP> on Aten25, Losar100; BP=120/70, he denies CP, palpit, dizzy, SOB, edema, etc...    CAD> on ASA81; followed by DrHochrein- CT Abd 2010 showed coronary calcif, Treadmill was neg; Myoview 3/13 was neg,  wnl; last seen 4/15- no new symptoms, active w/ yard work, EKG showed wnl, they decided to do Treadmill (borderline ST depression), then Myoview 6/15 showed NEG- no ischemia, decr uptake inferiorly same as 2013, EF=69%, norm wall motion, low risk...    Hyperlipid> on Niacin'500mg'$ ; FLP 1/16 showed TChol 150, TG 101, HDL 45, LDL 85; continue same, get wt down...    DM> on Metform500Bid; wt=222#, BMI=29; Labs 7/16 showed BS=129, A1c=6.7; rec to continue same med, better diet, get wt down!..    GI- GERD, dysphagia, Divertics, IBS, Polyps> on Protonix40, Miralax, Levsin0.125 prn; followed by DrPerry & stable- last colon 5/12 w/ severe divertics & 2 sm adenomas removed...    GU- Kid stone, BPH> on Flomax0.4 & Cialis prn; PSA remains wnl (PSA 1/16= 1.25)...    DJD, LBP> left knee complaints w/ shots from DrDuda,  similar for left shoulder; hx bilat hip AVN noted on prev CT Abd but he has min complaints and uses Tylenol prn (holding off on surg)...    Anxiety> he does not want anxiolytic meds.[name] EXAM shows Afeb, VSS, O2sat=97%;  Heent- neg, mallampati2;  Chest- clear w/o w/r/r;  Heart- RR w/o m/r/g;  Abd- soft, neg, nontender;  Ext- neg w/o c/c/e;  Neuro- intact...  Labs 7/16>  Chems- ok w/ BS=129, A1c=6.7, Cr=0.83... IMP/PLAN>>  Jesse Santos is stable, meds refilled, he's already had the 2016 Flu vaccine & up to date;  We discussed diet/ exercise/ wt reduction... ROV in 80mo  ~  October 15, 2015:  620moOV & Jesse Santos saw TP 07/2015 for a lesion on tongue- sent to Derm DrLupton ?lichenoid mucositis (rx w/ several steroid shots & topical meds) & may need [facility] [employer]. He has mult somatic complaints> LBP, leg weakness, HAs, cough/ congestion/ clear mucous (yet he states exercise on treadmill for 3028mat 2.6mp29m.. We reviewed the following medical problems during today's office visit >>     HOH> he has bilat hearing aides...    AR> on allergy shots per DrVanWinkle, OTC antihist, Saline, Flonase; gets occas bouts of sinusitis but overall improved & no recent infections reported...     OSA> Sleep Study 2/04 w/ RDI=45 & desat to 88% w/ mod snoring & leg jerks, but he states he never could use the CPAP effectively & states he sleeps fine now if he stays on his side; denies daytime sleepiness or other issues...    COPD> exsmoker quit 1981; on AdvairHFA115-2spBid, Mucinex prn; he doesn't like Pred Rx; prev Bronchitic exac treated w/ Levaquin, Medrol, Mucinex & improved; needs incr exercise program...    HBP> on Aten25, Losar100; BP=126/70, he denies CP, palpit, dizzy, SOB, edema, etc...    CAD> on ASA81; followed by DrHochrein- CT Abd 2010 showed coronary calcif, Treadmill was neg; Myoview 3/13 was neg, wnl; last seen 4/15- no new symptoms, active w/ yard work, EKG showed wnl, they decided to do Treadmill (borderline ST  depression), then Myoview 6/15 showed NEG- no ischemia, decr uptake inferiorly same as 2013, EF=69%, norm wall motion, low risk...    Hyperlipid> on Niacin'500mg'$ ; FLP 1/17 showed TChol 132, TG 52, HDL 46, LDL 75; continue same, get wt down...    DM> on Metform500Bid; wt=220#, BMI=29; Labs 7/16 showed BS=129, A1c=6.7; rec to continue same med, better diet, get wt down; Labs 1/17 showed BS=134, A1c=6.8...  Marland KitchenMarland KitchenGI- GERD, dysphagia, Divertics, IBS, Polyps> on Protonix40, Miralax, Levsin0.125 prn; followed by DrPerry & stable- last colon 5/12 w/ severe divertics & 2 sm adenomas  removed...    GU- Kid stone, BPH> on Flomax0.4 & Cialis prn; PSA remains wnl (PSA 1/16= 1.25, 1/17=1.06)...    DJD, LBP> left knee complaints w/ shots from DrDuda, similar for left shoulder; hx bilat hip AVN noted on prev CT Abd but he has min complaints and uses Tylenol prn (holding off on surg)...    Anxiety> he does not want anxiolytic meds.[name] EXAM shows Afeb, VSS, O2sat=98%;  Heent- neg, mallampati2;  Chest- clear w/o w/r/r;  Heart- RR w/o m/r/g;  Abd- soft, neg, nontender;  Ext- neg w/o c/c/e;  Neuro- intact...  LABS 10/15/15>  FLP- at goals on diet + niacin;  Chems- ok x BS=134, A1c=6.8 on Metform500bid;  CBC- wnl;  TSH=1.08;  PSA=1.06 IMP/PLAN>>  Jesse Santos has mult somatic complaints but objectively in good shape; we discussed ENT 2nd opinion re tongue lesion vs Med Center referral per Derm, he will decide; continue present Rx & ROV 34mo..  ~  April 13, 2016:  667moOV and after the last visit Jesse Santos saw DrShoemaker, ENT 10/29/15 for the lesion on his tongue- his note is reviewed in Care Everywhere, he concurred w/ topical steroid therapy for this chronic tongue irritation & gave him Triamcinolone cream;  DrLupton [facility] 04/01/16 7 note reviewed, bx proven lichenoid dermatitis, exam showed large erosions on lat border of tongue c/w oral erosive lichen planus; He rec Diflucan, Mycelex (for prob candida) &  Tacrolimus (Prograf) to dissolve in water & swish as directed=> pt indicates that he is improved, and they are planning f/u eval... we reviewed the following medical problems during today's office visit >>     HOH> he has bilat hearing aides & has been checked by DrShoemaker.    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    AR> on allergy shots per DrVanWinkle, OTC antihist, Saline, Flonase; gets occas bouts of sinusitis but overall improved & no recent infections reported...     OSA> Sleep Study 2/04 w/ RDI=45 & desat to 88% w/ mod snoring & leg jerks, but intol to CPAP & never used; states he sleeps fine now if he stays on his side; denies daytime sleepiness or other issues, naps 1/7...    COPD> exsmoker quit 1981; on AdvairHFA115-2spBid w/ spacer, Mucinex prn; he doesn't like Pred Rx; prev Bronchitic exac treated w/ antibiotics (Levaquin/ Augmentin), Medrol, Mucinex & improved; needs incr exercise program...    HBP> on Aten25, Losar100; BP=142/68, he denies CP, palpit, dizzy, SOB, edema, etc...    CAD> on ASA81; followed by DrHochrein- seen 12/19/15, CT Abd 2010 showed coronary calcif, Treadmill was neg (borderline ST depression); Myoview 3/13 was neg, wnl & Myoview 6/15 showed NEG- no ischemia, decr uptake inferiorly same as 2013, EF=69%, norm wall motion, low risk... He denies CP, palpit, dizzy, SOB, edema, etc; he remains active w/ yard work etc...    Hyperlipid> on Niacin50026mFLP 1/17 showed TChol 132, TG 52, HDL 46, LDL 75; continue same, get wt down...    DM> on Metform500Bid; wt=218#, BMI=29; Labs 7/16 showed BS=129, A1c=6.7; rec to continue same med, better diet, get wt down; Labs 1/17 showed BS=134, A1c=6.8 and A1c 04/13/16= 6.7, stable...    GI- GERD, dysphagia, Divertics, IBS, Polyps> on Protonix40, Miralax, Levsin0.125 prn; followed by DrPerry & stable- last colon 5/12 w/ severe divertics & 2 sm adenomas  removed...    GU- Kid stone, BPH> on Flomax0.4 &  Cialis prn; PSA remains wnl (PSA 1/16= 1.25, 1/17=1.06)...    DJD, LBP> left knee complaints w/ shots from DrDuda, similar for left shoulder; hx bilat hip AVN noted on prev CT Abd but he has min complaints and uses Tylenol prn (holding off on surg)...    Anxiety> he does not want anxiolytic meds.[name] EXAM shows Afeb, VSS, O2sat=98%;  Heent- neg, mallampati2;  Chest- clear w/o w/r/r;  Heart- RR w/o m/r/g;  Abd- soft, neg, nontender;  Ext- neg w/o c/c/e;  Neuro- intact...  LABS 04/13/16:  BMet- ordered but not done;  A1c=6.7.Marland KitchenMarland Kitchen IMP/PLAN>>  Jesse Santos is stable w/ his mult medical issues as above- oral lichen planus improved on regimen from Derm at [facility]; BP controlled, he remains active w/o angina, FLP & DM well regualted=> continue same meds.  ~  October 15, 2016:  41moROV & Jesse Santos was seen by SG 07/2016 & TP 08/2016 w/ cough prod of green mucus, sinus congestion, but no f/c/s; he was placed on MTX by DrJorizzo, [facility] due to his tongue lesion felt to be oral lichen planus, 2=> incr to 3 tabs once per week along w/ Folate, Mycelex and Prograf (1 cap dissolved in water & swished);  He remains on AdvairHFA-2spBid & he's careful to rinse after these treatments too;  He was treated w/ Levaquin + Pred taper and he followed up w/ TP 09/08/16-- improved w/ decr cough & dyspnea, no wheezing, remains on Advair, flonase, claritin, mucinex... we reviewed the following medical problems during today's office visit >>     HOH> he has bilat hearing aides & has been checked by DrShoemaker.    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    AR> on allergy shots per DrVanWinkle, OTC antihist, Saline, Flonase; gets occas bouts of sinusitis but overall improved & no recent infections reported...     OSA> Sleep Study 2/04 w/ RDI=45 & desat to 88% w/ mod  snoring & leg jerks, but intol to CPAP & never used; states he sleeps fine now if he stays on his side; denies daytime sleepiness or other issues, naps 1/7...    COPD> exsmoker quit 1981; on AdvairHFA115-2spBid w/ spacer, Mucinex prn; he doesn't like Pred Rx; prev Bronchitic exac treated w/ antibiotics (Levaquin/ Augmentin), Medrol, Mucinex & improved; needs incr exercise program...    HBP> on Aten25, Losar100; BP=116/60, he denies CP, palpit, dizzy, SOB, edema, etc...    CAD> on ASA81; followed by DrHochrein- seen 12/19/15, CT Abd 2010 showed coronary calcif, Treadmill was neg (borderline ST depression); Myoview 3/13 was neg, wnl & Myoview 6/15 showed NEG- no ischemia, decr uptake inferiorly same as 2013, EF=69%, norm wall motion, low risk... He denies CP, palpit, dizzy, SOB, edema, etc; he remains active w/ yard work etc...    Hyperlipid> on Niacin5039m FLP 1/17 showed TChol 132, TG 52, HDL 46, LDL 75; continue same, get wt down...    DM> on Metform500Bid; wt=216#, BMI=29; Labs 1/17 showed BS=134, A1c=6.8; rec to continue same med, better diet, get wt down; Labs 2/18 showed BS=136, A1c=6.7; stable...    GI- GERD, dysphagia, Divertics, IBS, Polyps> on Protonix40, Miralax, Levsin0.125 prn; followed by DrPerry & stable- last colon 5/12 w/ severe divertics & 2 sm adenomas removed; he wants off PPI=> change to PEPCID40...    GU- Kid stone, BPH> on Flomax0.4 & Cialis prn; PSA remains wnl (PSA 1/16= 1.25, 1/17=1.06, 2/18=1.45)...Marland KitchenMarland Kitchen  DJD, LBP> left knee complaints w/ shots from DrDuda, similar for left shoulder; hx bilat hip AVN noted on prev CT Abd but he has min complaints and uses Tylenol prn (holding off on surg)...    Anxiety> he does not want anxiolytic meds.[name] EXAM shows Afeb, VSS, O2sat=99%;  Wt=216#, 6'2"Tall, BMI=28;  Heent- neg, mallampati2;  Chest- clear w/o w/r/r;  Heart- RR w/o m/r/g;  Abd- soft, neg, nontender;  Ext- neg w/o c/c/e;  Neuro- intact...  CXR 08/10/16>  Norm heart size, atherosclerotic  Ao, clear lungs- NAD, degen changes in Tspine w/ some scoliosis...  LABS 10/15/16>  FLP- at goals on Niaspan;  Chems- ok w/ BS=136, A1c=6.7;  CBC- wnl w/ Hg=13.9, Fe=169 (42%sat), B12=504;  TSH=1.04,  PSA=1.45...  IMP/PLAN>>  Jesse Santos is still dealing w/ his oral prob= Lichen Planus & improved w/ treatment program under the direction of [facility]- on MTX 3/wk, Prograf, Mycelex;  His wife is concerned about his being on Protonix40 for many yrs, followed by DrPerry w/ GERD/ dysphagia, last EGD 2009 was essentially normal- OK to stop the PPI & change to Pepcid40;  Continue other meds the same + diet/ exercise...   ~  November 18, 2016:  38moROV & post hospital visit> DMarden Noblewas hospitalized 3/1 - 11/15/16 by [facility] w/ multilobar community acquired pneumonia (predom lingular w/ CT abn in LLL & RLL as well);  He woke w/ cough, malaise, myalgias, & chills;  Temp in ER was 103, BP was wnl, O2sat was 94% on 2L/min, CXR showed LLL pneumonia, no organism specified, treated w/ rocephin & zithromax=> disch on oral zithromax & medrol taper...     Currently feeling better- sl sore throat, cough, sm amt beige sput, no hemoptysis, +SOB/DOE & still "weak", some chest discomfort=> we decided to f/u CXR/ Labs (see below)...    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    AR> on allergy shots per DrVanWinkle, OTC antihist, Saline, Flonase; gets occas bouts of sinusitis but overall improved & no recent infections reported...     OSA> Sleep Study 2/04 w/ RDI=45 & desat to 88% w/ mod snoring & leg jerks, but intol to CPAP & never used; states he sleeps fine now if he stays on his side; denies daytime sleepiness or other issues, naps 1/7...    COPD> exsmoker quit 1981; on AdvairHFA115-2spBid w/ spacer, Mucinex prn; he doesn't like Pred Rx; prev Bronchitic exac treated w/ antibiotics (Levaquin/ Augmentin),  Medrol, Mucinex & improved; needs incr exercise program... EXAM shows Afeb, VSS, O2sat=99%;  Wt=216#, 6'2"Tall, BMI=28;  Heent- neg, mallampati2;  Chest- clear w/o w/r/r;  Heart- RR w/o m/r/g;  Abd- soft, neg, nontender;  Ext- neg w/o c/c/e;  Neuro- intact...  CXR 11/12/16 showed LLL pneumonia, borderline cardiomegaly, aortic calcif  CT Chest 11/13/16>  Neg for PE, multilobar left lung pneumonia, poss early RLL infiltrate, calcif aortic & coronary arteries...  CXR today 11/18/16 showed norm heart size, aortic atherosclerosis, left lung opacity is resolving/ improved, no effusion etc...  LABS 11/2016>  Chems- ok x BS 110-190;  CBC- ok x WBC 14K=>9K  LABS 11/18/16>  Chems- ok w/ BS=110;  CBC- improved w/ Hg=13.7, WBC=8.5 IMP/PLAN>>  DMarden Noblehas improved from his recent bout of pneumonia (no organism identified); he has finished the antibiotics (Zithromax), & weaning off the Medrol;  We plan ROV recheck in 3-4weeks...   ~  December 09, 2016:  3wk [place] is improved overall "just weak" he says;  BP is good at home on Losar50 reading 130-140/ 60-70 at home;  He denis much cough, sput, no hemoptysis, no CP, SOB improved & now active on his treadmill, doing housework, etc;  His CC is nasal drainage;  F/u CXR today is resolved- NAD...     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    AR> on allergy shots per DrVanWinkle, OTC antihist, Saline, Flonase; gets occas bouts of sinusitis but overall improved & no recent infections reported...     OSA> Sleep Study 2/04 w/ RDI=45 & desat to 88% w/ mod snoring & leg jerks, but intol to CPAP & never used; states he sleeps fine now if he stays on his side; denies daytime sleepiness or other issues, naps 1/7...    COPD> exsmoker quit 1981; on AdvairHFA115-2spBid w/ spacer, Mucinex prn; he doesn't like Pred Rx; prev Bronchitic exac treated w/  antibiotics (Levaquin/ Augmentin), Medrol, Mucinex & improved; needs incr exercise program...    Medical Issues>  HBP, CAD, HL, DM, Gastroparesis, BPH, DJD, anxiety... EXAM shows Afeb, VSS, O2sat=98%;  Wt=211#, 6'2"Tall, BMI=28;  Heent- neg, mallampati2;  Chest- clear w/o w/r/r;  Heart- RR w/o m/r/g;  Abd- soft, neg, nontender;  Ext- neg w/o c/c/e;  Neuro- intact...  CXR 12/09/16 (independently reviewed by me in the PACS system) showed norm heart size, clear lungs- NAD... IMP/PLAN>>  Jesse Santos's pneumonia has resolved & he is approaching his baseline "just weak" he says;  Rec to continue his baseline regimen, diet, exercise, etc...   ~  April 14, 2017:  58moROV & Jesse Santos reports that his Lichen Planus (tongue soreness) is improved and [facility]  He notes that his H2-blocker Pepcid isn't enough for his reflux & feels he needs to go back on Protonix40- ok (take it 360m before dinner);  Breathing is good but it is "dusty at my place" and this bothers his sinuses> prev on allergy shots for 22 yrs and DrESL stopped them when he retired, still uses ClNordstromdenies SOB, CP, etc...  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    AR> prev on allergy shots per DrESL, OTC antihist, Saline, Flonase; gets occas bouts of sinusitis but overall improved but he's concerned not doing as well off the shots- asked to check in w/ DrVanWinkle.    OSA> Sleep Study 2/04 w/ RDI=45 & desat to 88% w/ mod snoring & leg jerks, but intol to CPAP & never used; states he sleeps fine now if he stays on his side; denies daytime sleepiness or other issues, naps 1/7...    COPD> exsmoker quit 1981; on AdvairHFA115-2spBid w/ spacer, Mucinex prn; he doesn't like Pred Rx; prev Bronchitic exac  treated w/ antibiotics (Levaquin/ Augmentin), Medrol, Mucinex & improved; needs incr exercise program...    Medical Issues>  HBP (controlled), CAD (DrHochrein last 4/17), HL (on Niaspan, FLP ok 2/18), DM (on Metform, Labs 2/18 w/ BS-167 A1c-6.7) , Gastroparesis, BPH, DJD, anxiety... EXAM shows Afeb, VSS, O2sat=99%;  Wt=215#, 6'2"Tall, BMI=28;  Heent- neg, mallampati2;  Chest- clear w/o w/r/r;  Heart- RR w/o m/r/g;  Abd- soft, neg, nontender;  Ext- neg w/o c/c/e;  Neuro- intact... IMP/PLAN>>  OK back on Protonix40 taken 57min before dinner;  Continue same meds and check back w/ DrVanWinkle regarding allergy shots;  OK- refills today & we reviewed diet & exercise...   ~  May 27, 2017:  6wk Justice has seen Ortho-DrDuda for back pain & right hip pain, XRays showed arthritis; Tramadol provided some temporary relief, they decided to try a course of Pred w/ rov to assess for MRI & need for surg... Pt called last wk stating that he's seen DrVanWinkle w/ sinus symptoms and was given Cefdinir x10d but he reports only min better; also on the Pred from DrDuda; still c/o sinus congestion, pressure, light yellow mucus draining, still using Mucinex, Claritin, Flonase & now reporting chest tightness & some wheezing; he states "I can't clear my sinuses", denies f/c/s but has drainage/ blowing/ coughing...    EXAM shows Afeb, VSS, O2sat=97%;  Wt=215#;  Heent- +cerumen impactions, +sinus congestion, mallampati2;  Chest- clear w/o w/r/r;  Heart- RR w/o m/r/g;  Abd- soft, neg, nontender;  Ext- neg w/o c/c/e;  Neuro- intact..  CXR 05/27/17 (independently reviewed by me in the PACS system)> norm heart size, sl pleural thickening, sl incr markings- NAD...  LABS 05/27/17 showed Chems- wnl x BS=171, CBC- Hg=13.1, WBC=11K;  Sed=16...  Sputum C&S 05/27/17> NEG- NTF only IMP/PLAN>>  Jesse Santos has had a long hx recurrent sinusitis & allergies followed by DrESL at [facility]; now off shots & having more  difficulty; he has seen DrVanWinkle in follow up;  We discussed Rx w/ Levaquin 750mg /d x10d + Align & Activia;  Given Depo120 & continue Pred taper from DrDuda; continue Mucinex, Flonase, Claritin, [place] and f/u w/ ENT DrShoemaker for cerumen/ hearing aides/ sinusitis...  ADDENDUM>>  Sput from 05/27/17 was also set up for Fungal smear&culture> lab now reports smear pos for fungal elements, and cult shows Aspergillus flavus, Aspergillus fumigatus, Cryptococcus laurentii, & Bipolaris species;  He is on chr therapy for oral lichen planus from DrJorizzo at [facility]- on MTX (3/wk), Prograf (oral swish), Mycelex (troches), & Folate;  He has had mult sinus infections treated periodically w/ Levaquin, Augmentin, etc;  He also gets freq courses for Pred from [place] for sinusitis/ AB and from Ortho for back & right hip pain... I do not feel that these 4 agents are causing dis in his sinuses or lungs- suspect oral contam?  Plan is to try a course of MMW & repeat Sputum Fungal culture=> discussed w/ pt.  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          Problem List:  HEARING LOSS (ICD-389.9) - he has bilat hearing aides...  ALLERGIC RHINITIS (ICD-477.9) - on allergy shots from DrESL Gaet[place])... plus Claritin, Saline, Mucinex, Flonase. ~  occas bouts of sinusitis requiring antibiotic Rx... ~  8/13: he had allergy f/u DrVanWinkle> skin testing 2010 pos to dust mites and molds; doing well on shots once/mo; stable & no changes made... ~  3/14: he presented w/ an upper resp infection & treated w/ Depo, Dosepak, Zithromax... ~  8/14:  He had allergy f/u DrVanWinkle> Advair, Flonase, Saline, Mucinex; he checked FENO- reported normal... ~  7/15: stable on allergy meds  from DrVanWinkle, plus AdvairHFA115; he had sinus/ AB exac 5/15 treated by TP w/ ZPak=>  Levaquin & resolved...  OBSTRUCTIVE SLEEP APNEA (ICD-327.23) - sleep study 8/04 showed RDI= 45 & desat to 88%... mod snoring and leg jerks without sleep disruption... CPAP perscribed but not using it now- states he can't sleep w/ it on & furthermore he is resting well as long as he stays on his side... denies daytime hypersomnolence & not interested in re-evaluation.  COPD (ICD-496) - ex-smoker, quit 1981 >>  ~  on ADVAIR 100Bid, PROAIR Prn, MUCINEX Bid... he had Pneumovax in 2008... doesn't like Pred Rx...  ~  10/11:  doing well except recent cough, min green sputum (no change in dyspnea)- we will Rx w/ Augmentin, incr Mucinex/ Fluids. ~  4/12:  Breathing at baseline, continue maintenance meds... ~  CXR 12/13 showed normal heart size, clear lungs, NAD... ~  1/14:  He's had 2 exac this past yr- saw TP w/ Rx & improved... ~  1/15:  He continues to do well on Advair + allergy Rx from DrVanWinkle w/ Flonase, Saline, Mucinex... ~  7/15: exsmoker quit 1981; on AdvairHFA115-2spBid, Mucinex prn; he doesn't like Pred Rx; he had Bronchitic exac treated by TP 5/15 w/ ZPak, then Levaquin, Mucinex, etc & improved; needs incr exercise program... ~  10/15: seen w/ refractory bronchitic episode reqiring ZPak=>Levaquin, Medrol, Mucinex, Hydromet, etc... ~  1/16: He had a URI/ AB exac treated 10/15 w/ Levaquin/ Medrol/ Mucinex & resolved; now at baseline on Advair115-2spBid etc... ~  11/2016> he developed a LLL pneumonia (nos) & resolved w/ Rocephin/ Zithromax, Medrol, Nebs, etc...  HYPERTENSION (ICD-401.9) - prev on diet alone> then on Diovan320, but we decided to change to LOSARTAN 100mg /d 5/13 to save $$ ~  He claims that Norvasc & HCTZ "stopped my urine flow"... ~  10/11:  BP= 128/80 & even better at home he says... denies HA, fatigue, visual changes, CP, palipit, dizziness, syncope, dyspnea, edema, etc... ~  4/12:  Pt has seen TP, DrHochrein, & checking BP daily at home; tried on Norvasc but claims it decr his  urine flow; now on Diovan & improved w/ BP= 132/70... ~  5/12:  BP remains well controlled on Diovan 320mg /d= 120/62 today. ~  11/12:  BP= 142/82 on Diovan160 now; denies CP, palpit, dizzy, SOB, edema... ~  5/13:  BP= 128/66 & he's requesting change to cheaper med; rec switch Diovan to LOSARTAN 100mg /d... ~  1/14: on Losar100; BP=140/84, he notes incr HR he says & we decided to add low dose ATENOLOL25mg /d... ~  7/14: on Aten25, Losar100> BP=128/72 & he denies CP, palpit, SOB, edema, etc... ~  1/15: BP controlled on Aten25, Losar100 w/ BP= 130/80 & he denies CP, palpit, SOB, edema. ~  7/15: on Aten25, Losar100; BP=124/70, he denies CP, palpit, dizzy, SOB, edema, etc. ~  10/15: on Aten25, Losar100; BP= 140/60 & remains stable hemodynamically... ~  BP remains under good control on same meds...  R/O CAD (ICD-414.00) - on ASA 81mg /d... Followed by DrHochrein & his notes are reviewed. ~  hx neg cardiac cath 1988 [place]... ~  NuclearStressTest 10/02 was norm- no scar or ischemia, EF=55%. ~  CT Abd 4/10 via ER showed coronary calcif... referred to Cards. ~  Eval by DrHochrein 6/10 showed norm EKG & cardiac exam... treadmill ordered. ~  Treadmill 6/10 showed reasonable exerc tolerance, no ischemic changes, few PVCs, sl incr BP... ~  EKG 3/13 showed NSR, rate76, WNL, NAD.Marland KitchenMarland Kitchen ~  Myoview 3/13 was neg> exercised for 45min, stopped for fatigue & CP; no ST seg changes, occas PACs & PVCs; hypertensive BP response; no ischemia, EF=71%, normal wall motion... ~  He saw DrHochrein 4/14> hx CAD, doing satis on ASA & BP rx w/o angina & rec to follow program of aggressive risk factor reduction, no change in meds.  ~  EKG 4/14 showed NSR, rate60, wnl, NAD.. ~  6/15: .on ASA81; followed by DrHochrein- last seen 4/15- no new symptoms, active w/ yard work, EKG showed wnl, they decided to do Treadmill (borderline ST depression), then Myoview 6/15 showed NEG- no ischemia, decr uptake inferiorly same as 2013, EF=69%,  norm wall motion, low risk...  MIXED HYPERLIPIDEMIA (ICD-272.2) - low HDL and NIASPAN 500mg /d started 4/09... ~  [place] 4/09 showed TChol 160, TG 106, HDL 26, LDL 113... rec- diet + Niaspan 500/d. ~  FLP 10/09 showed TChol 155, TG 78, HDL 33, LDL 106... ~  FLP 4/10 showed TChol 133, TG 63, HDL 30, LDL 91... rec> continue same. ~  FLP 4/11 showed TChol 144, TG 61, HDL 38, LDL 94 ~  FLP 10/11 showed TChol 155, TG 81, HDL 40, LDL 99 ~  FLP 4/12 showed TChol 142, TG 69, HDL 38, LDL 90 ~  FLP 11/12 showed TChol 165, TG 82, HDL 43, LDL 106 ~  FLP 5/13 in Niasp500 showed TChol 155, TG 72, HDL 46, LDL 95... rec change to generic. ~  FLP 1/14 on Niacin500 showed TChol 140, TG 56, HDL 39, LDL 90 ~  FLP 1/15 on Niaspan500 showed TChol 148, TG 57, HDL 43, LDL 94  ~  FLP 1/16 on Niaspan500 showed TChol 150, TG 101, HDL 45, LDL 85 ~  FLP 1/17 on Niaspan500 showed TChol 132, TG 52, HDL 46, LDL 75  DM (ICD-250.00) - on diet + METFORMIN 500mg Bid... he reports BS at home all in the 120-130's... ~  labs 4/09 showed BS= 132, HgA1c= 6.6.Marland KitchenMarland Kitchen rec- same med, better diet... ~  labs 10/09 showed BS= 131, HgA1c= 6.3.Marland KitchenMarland Kitchen ~  labs 4/10 showed BS= 117, HgA1c= 6.2.[name]. rec> same meds/ diet Rx. ~  labs 10/10 showed BS= 138, A1c= 6.5 ~  labs 4/11 showed BS= 121, A1c= 6.5.Marland KitchenMarland Kitchen On Metform500Bid + diet. ~  Dilated eye exam 5/11 by DrShapiro- no retinopathy... ~  labs 10/11 (wt=228#) showed BS= 118, A1c= 7.1.[name]. not as good- get on diet or more meds. ~  Labs 4/12 (wt=229#) showed BS= 119, A1c= 7.3... Wrong direction, may need more meds, get wt down! ~  5/12:  Ophthalmology check by DrShapiro was neg- no retinopathy... ~  Labs 11/12 showed BS= 136, A1c= 7.0 ~  Labs 5/13 on MetformBid showed BS= 132, A1c= 7.1.Marland KitchenMarland Kitchen Continue same, get wt down. ~  Ophthalmology check from DrShapiro 5/13> no DM retinopathy... ~  Labs 1/14 showed BS= 134, A1c= 7.5; not as good- needs better diet, get wt down, same meds for now... ~  Labs 7/14 on Metform500Bid  showed BS= 140, A1c= 7.3  ~  Labs 1/154 on Metform500Bid showed BS= 129 & A1c= 7.0, and we reviewed meds/ diet/ exercise/ weight... ~  Labs 7/15 on Metform500Bid showed BS= 126, A1c= 7.1 & reminded of need for diet/ exerc/ wt reduction... ~  Labs 1/16 on Metform500Bid showed BS= 133, A1c= 7.2 ~  Labs 1/17 on Metform500Bid showed BS= 134, A1c= 6.8  GERD (ICD-530.81) & ? GASTROPARESIS - on PROTONIX 40mg /d, & off Reglan per DrPerry... ~   last EGD 6/07 by  DrPerry showed gastic polyp... ~  8/10:  given Compazine suppos for gastroparesis flair by DrGessner. ~  4/12:  C/o intermittent dysphagia & referred back to GI for further eval (colonoscopy due as well)...  DIVERTICULOSIS OF COLON (ICD-562.10) - he uses MIRALAX Bid regularly... IRRITABLE BOWEL SYNDROME (ICD-564.1) COLONIC POLYPS (ICD-211.3) ~  last colonoscopy 5/07 by DrSam showed divertics only... f/u 27yrs. ~  CT Abd 4/10 in ER showed atx bases, coronary calcif, s/p GB, abdAo calcif, 33mm renal stone, divertics, bilat hip AVN. ~  Follow up colonoscopy by DrPerry 5/12 showed severe diverticulosis, 2 sm polyps= tubular adenoma & f/u planned 5 yrs...  RENAL CALCULUS, HX OF (ICD-V13.01) BENIGN PROSTATIC HYPERTROPHY, HX OF (ICD-V13.8) - on FLOMAX 0.4mg /d w/ improved symptoms (he states flow better since stopping Vit E supplement). ~  4/12:  Routine PSA= 4.9 (it was 1.02 4/11) & we rx w/ Doxy Bid x14d, thewn plan recheck PSA==> 1.30  DEGENERATIVE JOINT DISEASE (ICD-715.90) - pt states "I have a bum knee" and eval by DrDuda w/ viscosupplementation shots... Note: Abd CT w/ bilat hip AVN noted... takes TYLENOL Prn... ~  11/10:  s/p left knee arthroscopy by DrDuda... ~  10/11:  s/p fall w/ left knee injury- s/p cortisone shot by DrDuda; hx shot in left shoulder too. ~  7/14: he is c/o incr pain in left hip area; known hx AVN & needs f/u by Ortho...  BACK PAIN, LUMBAR (ICD-724.2)  ANXIETY (ICD-300.00) - not currently on meds for nerves.  ANEMIA  (ICD-285.9) - prev hx of GIB... ~  labs 4/09 showed Hg= 14.1 ~  labs 4/10 showed Hg= 13.6 ~  labs 4/11 showed Hg= 13.4 ~  Labs 4/12 showed Hg= 13.8 ~  Labs 3/13 showed Hg= 13.1 ~  Labs 1/14 showed Hg= 13.6 ~  Labs 1/15 showed Hg= 13.7 ~  Labs 1/16 showed Hg= 14.1 ~  CBC remains WNL...  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  Health Maintenance: ~  GI: followed by DrPerry> Colonoscopy 5/07 by DrSam... f/u planned 5 yrs. ~  GU:  See PSAs recorded above... ~  Immunizations: Pneumovax in 2008 @ age 35;  [place] given 7/15; Tetanus- given 4/10;  he gets yearly seasonal Flu vaccine.   Past Surgical History:  Procedure Laterality Date  . CATARACT EXTRACTION    . CHOLECYSTECTOMY    . KNEE SURGERY  11/10  . SKIN CANCER EXCISION  3/10   left hand     Outpatient Encounter Prescriptions as of 05/27/2017  Medication Sig  . acetaminophen (TYLENOL) 500 MG tablet Take 500 mg by mouth every 6 (six) hours as needed for headache (pain).   [name] aspirin EC 81 MG tablet Take 81 mg by mouth at bedtime.  [name] atenolol (TENORMIN) 25 MG tablet Take 1 tablet (25 mg total) by mouth daily.  . Cholecalciferol (VITAMIN D) 2000 UNITS CAPS Take 2,000 Units by mouth daily.   . clotrimazole (MYCELEX) 10 MG troche Take 10 mg by mouth daily.   . COMPRO 25 MG suppository PLACE 1 SUPPOSITORY (25 MG TOTAL) RECTALLY EVERY 12 (TWELVE) HOURS AS NEEDED FOR NAUSEA.  [name] dextromethorphan-guaiFENesin (MUCINEX DM) 30-600 MG per 12 hr tablet Take 1 tablet by mouth every 12 (twelve) hours.  . fluticasone (FLONASE) 50 MCG/ACT nasal spray Place 2 sprays into both nostrils daily.  . fluticasone-salmeterol (ADVAIR HFA) 115-21 MCG/ACT inhaler Inhale 1 puff into the lungs 2 (two) times daily.  . folic acid (FOLVITE) 1 MG tablet Take  1 mg by mouth daily with lunch.   . hyoscyamine (LEVSIN SL) 0.125 MG SL tablet Place 1 tablet (0.125 mg total) under the tongue every 4 (four) hours as needed. As needed for abdominal cramping  (Patient taking differently: Place 0.125 mg under the tongue every 4 (four) hours as needed (abdominal cramping). )  . lidocaine (ASPERCREME W/LIDOCAINE) 4 % cream Apply 1 application topically daily as needed (pain).   [name] loratadine (CLARITIN) 10 MG tablet Take 10 mg by mouth daily as needed for allergies.  [name] losartan (COZAAR) 50 MG tablet Take 1 tablet (50 mg total) by mouth daily.  . metFORMIN (GLUCOPHAGE) 500 MG tablet Take 1 tablet (500 mg total) by mouth 2 (two) times daily with a meal.  . methotrexate (RHEUMATREX) 2.5 MG tablet Take 3 tablets (7.5 mg total) by mouth See admin instructions. Take 3 tablets (7.5 mg) by mouth on Tuesdays with lunch, starting 11/24/2016  . Multiple Vitamins-Minerals (MULTIVITAMIN WITH MINERALS) tablet Take 1 tablet by mouth daily.    . niacin (NIASPAN) 500 MG CR tablet Take 1 tablet (500 mg total) by mouth at bedtime.  . nitroGLYCERIN (NITROSTAT) 0.4 MG SL tablet Place 1 tablet (0.4 mg total) under the tongue every 5 (five) minutes as needed for chest pain.  . ONE TOUCH ULTRA TEST test strip TEST BLOOD SUGAR ONCE A DAY AS DIRECTED (DX E11.9)  . pantoprazole (PROTONIX) 40 MG tablet Take 1 tablet (40 mg total) by mouth daily.  . polyethylene glycol powder (GLYCOLAX/MIRALAX) powder TAKE 17 GRAMS BY MOUTH TWICE DAILY AS NEEDED  . predniSONE (DELTASONE) 10 MG tablet Take 2 tablets (20 mg total) by mouth daily with breakfast. Wean off as symptoms resolve  . Probiotic Product (PROBIOTIC PO) Take 1 tablet by mouth daily.  . sodium chloride (OCEAN) 0.65 % nasal spray Place 1 spray into the nose 3 (three) times daily as needed for congestion.   . tacrolimus (PROGRAF) 1 MG capsule Take 1 mg by mouth See admin instructions. Dissolve 1 capsule (1 mg) in 500 ml of water and keep in refrigerator - swish and spit small amount twice daily  . tamsulosin (FLOMAX) 0.4 MG CAPS capsule Take 1 capsule (0.4 mg total) by mouth 2 (two) times daily.  . traMADol (ULTRAM) 50 MG tablet TAKE ONE  TABLET BY MOUTH THREE TIMES A DAY AS NEEDED FOR PAIN  . amoxicillin-clavulanate (AUGMENTIN) 875-125 MG tablet Take 1 tablet by mouth 2 (two) times daily.  . [DISCONTINUED] levofloxacin (LEVAQUIN) 500 MG tablet Take 1 tablet (500 mg total) by mouth daily. (Patient not taking: Reported on 05/27/2017)  . [EXPIRED] methylPREDNISolone acetate (DEPO-MEDROL) injection 80 mg    No facility-administered encounter medications on file as of 05/27/2017.     Allergies  Allergen Reactions  . Prednisone Other (See Comments)    REACTION: high dose intolerance - causes numbness from waist down - low dose tapered course ok    Immunization History  Administered Date(s) Administered  . H1N1 08/21/2008  . Influenza Split 06/01/2011, 05/26/2012  . Influenza Whole 06/06/2008, 06/21/2009, 06/06/2010  . Influenza, High Dose Seasonal PF 07/03/2016  . Influenza,inj,Quad PF,6+ Mos 06/07/2013, 07/09/2014, 06/10/2015  . Pneumococcal Conjugate-13 04/05/2014  . Pneumococcal Polysaccharide-23 06/30/2006  . Zoster 09/14/2010    Current Medications, Allergies, Past Medical History, Past Surgical History, Family History, and Social History were reviewed in [employer] record.    Review of Systems         See HPI - all other systems  neg except as noted... The patient complains of decreased hearing, dyspnea on exertion, headaches, and difficulty walking.  The patient denies anorexia, fever, weight loss, weight gain, vision loss, hoarseness, chest pain, syncope, peripheral edema, prolonged cough, hemoptysis, abdominal pain, melena, hematochezia, severe indigestion/heartburn, hematuria, incontinence, muscle weakness, suspicious skin lesions, transient blindness, depression, unusual weight change, abnormal bleeding, enlarged lymph nodes, and angioedema.     Objective:   Physical Exam      WD, WN, 81 y/o WM in NAD... GENERAL:  Alert & oriented; pleasant & cooperative... HEENT:  [place]/AT, EOM-full,  PERRLA, EACs-clear, TMs-wnl, NOSE- pale, congested, THROAT-clear, but lichenoid lesion on lat tongue. NECK:  Supple w/ fairROM; no JVD; normal carotid impulses w/o bruits; no thyromegaly or nodules palpated; no lymphadenopathy. CHEST:  Clear to P & A w/o wheezing, rhonchi, rales, or signs of consolidation... HEART:  Regular Rhythm; without murmurs/ rubs/ or gallops detected...  ABDOMEN:  Soft & nontender; normal bowel sounds; no organomegaly or masses palpated., no guarding, or rebound EXT: without deformities, mild arthritic changes; no varicose veins/ venous insuffic/ or edema. NEURO:  CN's intact;  no focal neuro deficits... DERM:  No lesions noted; no rash etc...  RADIOLOGY DATA:  Reviewed in the EPIC EMR & discussed w/ the patient...  LABORATORY DATA:  Reviewed in the EPIC EMR & discussed w/ the patient...   Assessment & Plan:    Lesion on tongue>. LICHEN PLANUS and he is improved on Rx from [place] (MTX, Mycelex, & Prograf=> dissolve in water/ swish/ & spit)...   Hx Acute Bronchitis> presented 10/15 w/ refractory bronchitic episode; given ZPak=>Levaquin, Depo80/ Medrol dosepak, Mucinex, Hydromet, etc=> resolved... COPD exac>  He continues on Advair, Proair rescue, Mucinex; also Claritin, Flonase, Saline; stable continue same Rx... LLL Pneumonia 11/2016>  Hosp by [facility] & treated w/ Rocephin/Zithromax, Medrol taper, NEBS, etc & improved => resolved... 04/14/17>   OK back on Protonix40 taken 32min before dinner;  Continue same meds and check back w/ DrVanWinkle regarding allergy shots;  OK- refills today & we reviewed diet & exercise. 05/27/17>   Jesse Santos has had a long hx recurrent sinusitis & allergies followed by DrESL at [facility] [place]; now off shots & having more difficulty; he has seen DrVanWinkle in follow up;  We discussed Rx w/ Levaquin 750mg /d x10d + Align & Activia;  Given Depo120 & continue Pred taper from DrDuda; continue Mucinex, Flonase, Claritin,  [place] and f/u w/ ENT DrShoemaker for cerumen/ hearing aides/ sinusitis...   HBP>  On LOSARTAN 100mg /d & [id number] w/ good control of BP, continue same...  R/O CAD>  Followed by DrHochrein, and doing satis w/ neg Myoview 6/15... continue ASA & risk factor reduction....  Lipids>  Looks satis on his diet + Niacin...  DM>  Control is fair w/ A1c stable at 6.7 he was told there are more meds in his future if he doesn't get his wt down (currently~225#)...  GI>  Per DrPerry w/ GERD, ?Gastroparesis, Divertics, IBS, Colon polyps> f/u colon 5/12 w/ divertics & 2 sm adenomas, repeat planned ~73yrs...  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  Anxiety>  Certainly an issue, he does not want anxiolytic Rx...   Patient's Medications  New Prescriptions   AMOXICILLIN-CLAVULANATE (AUGMENTIN) 875-125 MG TABLET    Take 1 tablet by mouth 2 (two) times daily.  Previous Medications   ACETAMINOPHEN (TYLENOL) 500 MG TABLET    Take 500 mg  by mouth every 6 (six) hours as needed for headache (pain).    ASPIRIN EC 81 MG TABLET    Take 81 mg by mouth at bedtime.   ATENOLOL (TENORMIN) 25 MG TABLET    Take 1 tablet (25 mg total) by mouth daily.   CHOLECALCIFEROL (VITAMIN D) 2000 UNITS CAPS    Take 2,000 Units by mouth daily.    CLOTRIMAZOLE (MYCELEX) 10 MG TROCHE    Take 10 mg by mouth daily.    COMPRO 25 MG SUPPOSITORY    PLACE 1 SUPPOSITORY (25 MG TOTAL) RECTALLY EVERY 12 (TWELVE) HOURS AS NEEDED FOR NAUSEA.   DEXTROMETHORPHAN-GUAIFENESIN (MUCINEX DM) 30-600 MG PER 12 HR TABLET    Take 1 tablet by mouth every 12 (twelve) hours.   FLUTICASONE (FLONASE) 50 MCG/ACT NASAL SPRAY    Place 2 sprays into both nostrils daily.   FLUTICASONE-SALMETEROL (ADVAIR HFA) 115-21 MCG/ACT INHALER    Inhale 1 puff into the lungs 2 (two) times daily.   FOLIC ACID (FOLVITE) 1 MG TABLET    Take 1 mg by mouth daily with lunch.    HYOSCYAMINE (LEVSIN SL) 0.125 MG SL TABLET    Place 1 tablet  (0.125 mg total) under the tongue every 4 (four) hours as needed. As needed for abdominal cramping   LIDOCAINE (ASPERCREME W/LIDOCAINE) 4 % CREAM    Apply 1 application topically daily as needed (pain).    LORATADINE (CLARITIN) 10 MG TABLET    Take 10 mg by mouth daily as needed for allergies.   LOSARTAN (COZAAR) 50 MG TABLET    Take 1 tablet (50 mg total) by mouth daily.   METFORMIN (GLUCOPHAGE) 500 MG TABLET    Take 1 tablet (500 mg total) by mouth 2 (two) times daily with a meal.   METHOTREXATE (RHEUMATREX) 2.5 MG TABLET    Take 3 tablets (7.5 mg total) by mouth See admin instructions. Take 3 tablets (7.5 mg) by mouth on Tuesdays with lunch, starting 11/24/2016   MULTIPLE VITAMINS-MINERALS (MULTIVITAMIN WITH MINERALS) TABLET    Take 1 tablet by mouth daily.     NIACIN (NIASPAN) 500 MG CR TABLET    Take 1 tablet (500 mg total) by mouth at bedtime.   NITROGLYCERIN (NITROSTAT) 0.4 MG SL TABLET    Place 1 tablet (0.4 mg total) under the tongue every 5 (five) minutes as needed for chest pain.   ONE TOUCH ULTRA TEST TEST STRIP    TEST BLOOD SUGAR ONCE A DAY AS DIRECTED (DX E11.9)   PANTOPRAZOLE (PROTONIX) 40 MG TABLET    Take 1 tablet (40 mg total) by mouth daily.   POLYETHYLENE GLYCOL POWDER (GLYCOLAX/MIRALAX) POWDER    TAKE 17 GRAMS BY MOUTH TWICE DAILY AS NEEDED   PREDNISONE (DELTASONE) 10 MG TABLET    Take 2 tablets (20 mg total) by mouth daily with breakfast. Wean off as symptoms resolve   PROBIOTIC PRODUCT (PROBIOTIC PO)    Take 1 tablet by mouth daily.   SODIUM CHLORIDE (OCEAN) 0.65 % NASAL SPRAY    Place 1 spray into the nose 3 (three) times daily as needed for congestion.    TACROLIMUS (PROGRAF) 1 MG CAPSULE    Take 1 mg by mouth See admin instructions. Dissolve 1 capsule (1 mg) in 500 ml of water and keep in refrigerator - swish and spit small amount twice daily   TAMSULOSIN (FLOMAX) 0.4 MG CAPS CAPSULE    Take 1 capsule (0.4 mg total) by mouth 2 (two) times daily.  TRAMADOL (ULTRAM) 50 MG  TABLET    TAKE ONE TABLET BY MOUTH THREE TIMES A DAY AS NEEDED FOR PAIN  Modified Medications   No medications on file  Discontinued Medications   LEVOFLOXACIN (LEVAQUIN) 500 MG TABLET    Take 1 tablet (500 mg total) by mouth daily.

## 2017-06-03 ENCOUNTER — Ambulatory Visit (INDEPENDENT_AMBULATORY_CARE_PROVIDER_SITE_OTHER): Payer: Medicare Other | Admitting: Orthopedic Surgery

## 2017-06-03 DIAGNOSIS — I2510 Atherosclerotic heart disease of native coronary artery without angina pectoris: Secondary | ICD-10-CM | POA: Diagnosis not present

## 2017-06-03 DIAGNOSIS — M25551 Pain in right hip: Secondary | ICD-10-CM

## 2017-06-03 DIAGNOSIS — M5136 Other intervertebral disc degeneration, lumbar region: Secondary | ICD-10-CM

## 2017-06-03 NOTE — Progress Notes (Signed)
Office Visit Note   Patient: Jesse Santos           Date of Birth: 1936/04/21           MRN: [id number] Visit Date: 06/03/2017              Requested by: Noralee Space, MD [place], [place] [address] PCP: Noralee Space, MD  No chief complaint on file.     HPI: Patient is a 81 year old gentleman who had some right-sided radicular pain as well as right hip pain. Patient was started on low-dose prednisone patient states that due to a sinus infection he received a steroid injection as well as oral antibiotics. Patient states that the combination of prednisone and the steroid injection has essentially completely relieved his symptoms. Patient is currently on only half a tablet of the prednisone a day.  Assessment & Plan: Visit Diagnoses:  1. Pain in right hip   2. Other intervertebral disc degeneration, lumbar region     Plan: Recommended that he can get off the prednisone and couldn't use one at the 10 mg tablets to be has any flare up of his back or hip pain. Do not feel the patient needs any surgical intervention for his hip.  Follow-Up Instructions: Return if symptoms worsen or fail to improve.   Ortho Exam  Patient is alert, oriented, no adenopathy, well-dressed, normal affect, normal respiratory effort. Examination patient's gait is improved. He has no pain with range of motion of the hip he has a negative straight leg raise no focal motor weakness.  Imaging: No results found. No images are attached to the encounter.  Labs: Lab Results  Component Value Date   HGBA1C 6.7 (H) 10/15/2016   HGBA1C 6.7 (H) 04/13/2016   HGBA1C 6.8 (H) 10/15/2015   ESRSEDRATE 16 05/27/2017   ESRSEDRATE 48 (H) 11/18/2016   ESRSEDRATE 24 (H) 07/11/2009   REPTSTATUS 11/13/2016 FINAL 11/12/2016   CULT MULTIPLE SPECIES PRESENT, SUGGEST RECOLLECTION (A) 11/12/2016    Orders:  No orders of the defined types were placed in this encounter.  No orders of the defined types were  placed in this encounter.    Procedures: No procedures performed  Clinical Data: No additional findings.  ROS:  All other systems negative, except as noted in the HPI. Review of Systems  Objective: Vital Signs: There were no vitals taken for this visit.  Specialty Comments:  No specialty comments available.  PMFS History: Patient Active Problem List   Diagnosis Date Noted  . Sinusitis 05/27/2017  . History of pneumonia 05/27/2017  . Unilateral primary osteoarthritis, right hip 05/13/2017  . Other intervertebral disc degeneration, lumbar region 05/13/2017  . Pain in right hip 05/13/2017  . Hemoptysis 11/13/2016  . Community acquired pneumonia of left lower lobe of lung ([place]) 11/12/2016  . Lichen planus 33/82/5053  . Type 2 diabetes mellitus without complication, without long-term current use of insulin ([place]) 10/15/2015  . COPD (chronic obstructive pulmonary disease) with chronic bronchitis ([place]) 04/10/2015  . History of colonic polyps 12/28/2014  . Cellulitis 02/09/2013  . Obstruction to urinary outflow 01/02/2011  . Overweight(278.02) 11/11/2009  . GASTROPARESIS 04/15/2009  . CONSTIPATION 04/15/2009  . CORONARY ATHEROSCLEROSIS NATIVE CORONARY ARTERY 02/15/2009  . GASTROENTERITIS 01/01/2009  . Benign prostatic hyperplasia with urinary obstruction 07/29/2008  . MIXED HYPERLIPIDEMIA 07/10/2008  . Osteoarthritis 07/10/2008  . HEARING LOSS 01/11/2008  . Diverticulosis of large intestine 01/11/2008  . COLONIC POLYPS 07/22/2007  . ANEMIA 07/22/2007  .  Anxiety state 07/22/2007  . Obstructive sleep apnea 07/22/2007  . Essential hypertension 07/22/2007  . Allergic rhinitis 07/22/2007  . COPD exacerbation ([place]) 07/22/2007  . GERD 07/22/2007  . IRRITABLE BOWEL SYNDROME 07/22/2007  . BACK PAIN, LUMBAR 07/22/2007  . RENAL CALCULUS, HX OF 07/22/2007   Past Medical History:  Diagnosis Date  . Allergic rhinitis, cause unspecified   . Anemia   . Anxiety   . Benign  neoplasm of colon   . BPH (benign prostatic hypertrophy)   . COPD (chronic obstructive pulmonary disease) ([place])   . Coronary artery disease    Coronary calcification with negative ETT 2010;   ETT-Myoview (02/2014):  Normal stress nuclear study. No evidence of ischemia.  LV Ejection Fraction: 69%  . Degenerative joint disease   . Diverticulosis of colon (without mention of hemorrhage)   . Esophageal reflux   . Hearing loss   . Hyperplastic colon polyp 02/12/2011  . Irritable bowel syndrome   . Lumbar back pain   . Mixed hyperlipidemia   . Obstructive sleep apnea (adult) (pediatric)   . Oral lichen planus   . Renal calculus   . Type II or unspecified type diabetes mellitus without mention of complication, not stated as uncontrolled   . Unspecified essential hypertension     Family History  Problem Relation Age of Onset  . Heart disease Father 41  . Hypertension Brother   . Stroke Mother 66  . Diabetes Mother   . Colon cancer Neg Hx     Past Surgical History:  Procedure Laterality Date  . CATARACT EXTRACTION    . CHOLECYSTECTOMY    . KNEE SURGERY  11/10  . SKIN CANCER EXCISION  3/10   left hand    Social History   Occupational History  . retired     Nurse, mental health order   Social History Main Topics  . Smoking status: Former Smoker    Packs/day: 1.00    Years: 25.00    Types: Cigarettes    Quit date: 09/15/1979  . Smokeless tobacco: Former Systems developer    Quit date: 09/15/1979  . Alcohol use No  . Drug use: No  . Sexual activity: Not on file

## 2017-06-14 ENCOUNTER — Other Ambulatory Visit: Payer: Self-pay | Admitting: Pulmonary Disease

## 2017-06-14 DIAGNOSIS — Z8701 Personal history of pneumonia (recurrent): Secondary | ICD-10-CM

## 2017-06-14 LAB — FUNGUS CULTURE W SMEAR
MICRO NUMBER: 81012089
SPECIMEN QUALITY:: ADEQUATE

## 2017-06-22 ENCOUNTER — Other Ambulatory Visit: Payer: Medicare Other

## 2017-06-22 DIAGNOSIS — Z8701 Personal history of pneumonia (recurrent): Secondary | ICD-10-CM | POA: Diagnosis not present

## 2017-06-28 ENCOUNTER — Telehealth: Payer: Self-pay | Admitting: Pulmonary Disease

## 2017-06-28 NOTE — Telephone Encounter (Signed)
Spoke with patient. Advised him that the lab has received his specimen but the results are not back yet. He verbalized understanding. Nothing else needed at time of call.

## 2017-06-30 ENCOUNTER — Telehealth: Payer: Self-pay | Admitting: Pulmonary Disease

## 2017-06-30 DIAGNOSIS — J181 Lobar pneumonia, unspecified organism: Principal | ICD-10-CM

## 2017-06-30 DIAGNOSIS — J189 Pneumonia, unspecified organism: Secondary | ICD-10-CM

## 2017-06-30 NOTE — Telephone Encounter (Signed)
This can wait until SN returns

## 2017-06-30 NOTE — Telephone Encounter (Signed)
Amir with [facility] calling for a call report on pt's AF[employer].  Full report in Epic under Lab tab.   ------------------- Pulmonology (NADEL,SCOTT M, MD) Dx:  History of pneumonia  Component 8d ago  MICRO NUMBER: [id number] P   SPECIMEN QUALITY: ADEQUATEP   Source: SPUTUMP   STATUS: PRELIMINARYP   SMEAR: No fungal elements seen.P   ISOLATE 1: Cryptococcus neoformansP    ISOLATE 2: Yeast Isolated.P        Sending to DOD as SN is unavailable today.  MW please advise.  Thanks.

## 2017-07-01 DIAGNOSIS — L821 Other seborrheic keratosis: Secondary | ICD-10-CM | POA: Diagnosis not present

## 2017-07-01 DIAGNOSIS — Z85828 Personal history of other malignant neoplasm of skin: Secondary | ICD-10-CM | POA: Diagnosis not present

## 2017-07-02 ENCOUNTER — Ambulatory Visit (INDEPENDENT_AMBULATORY_CARE_PROVIDER_SITE_OTHER): Payer: Medicare Other

## 2017-07-02 DIAGNOSIS — Z23 Encounter for immunization: Secondary | ICD-10-CM

## 2017-07-02 NOTE — Telephone Encounter (Signed)
Pt left another contact # [phone]

## 2017-07-02 NOTE — Telephone Encounter (Signed)
Pt called wanting the results, can not get the flu shot until after the results were back, and will be leaving to go out of town this Sunday (07/04/2017)...Marland KitchenMarland KitchenPt contact # [phone]

## 2017-07-02 NOTE — Telephone Encounter (Signed)
Order placed for the pt to be set up with ID for a consult ASAP  For +sputum culture. Order has been placed.

## 2017-07-05 ENCOUNTER — Telehealth: Payer: Self-pay | Admitting: Pulmonary Disease

## 2017-07-05 NOTE — Telephone Encounter (Signed)
Spoke with pt, advised him to call ID clinic and reschedule appt. I gave him the phone number and nothing further is needed.

## 2017-07-13 ENCOUNTER — Ambulatory Visit (INDEPENDENT_AMBULATORY_CARE_PROVIDER_SITE_OTHER): Payer: Medicare Other | Admitting: Internal Medicine

## 2017-07-13 ENCOUNTER — Encounter: Payer: Self-pay | Admitting: Internal Medicine

## 2017-07-13 VITALS — BP 146/71 | HR 74 | Temp 98.3°F | Wt 216.0 lb

## 2017-07-13 DIAGNOSIS — I2510 Atherosclerotic heart disease of native coronary artery without angina pectoris: Secondary | ICD-10-CM

## 2017-07-13 DIAGNOSIS — J449 Chronic obstructive pulmonary disease, unspecified: Secondary | ICD-10-CM | POA: Diagnosis not present

## 2017-07-13 NOTE — Progress Notes (Signed)
[name] for Infectious [facility]  Reason for Consult: Possible fungal pneumonia Referring Physician: Dr. Teressa Lower  Assessment: He has grown a variety of fungal organisms on his 2 recent sputum cultures. Although he is at some risk for opportunistic infection related to his underlying lung disease and immunosuppressive therapy for lichen planus I do not see any evidence of active fungal infection. He has no evidence of active pneumonia, sinusitis or otitis. No suspect these organisms are simply part of his current normal flora. I would not do any further testing or treatment as long as he is doing well without evidence of new infection. He can follow-up here as needed.   Plan: 1. No further workup or treatment needed for possible fungal infection at this time   Patient Active Problem List   Diagnosis Date Noted  . Sinusitis 05/27/2017  . History of pneumonia 05/27/2017  . Unilateral primary osteoarthritis, right hip 05/13/2017  . Other intervertebral disc degeneration, lumbar region 05/13/2017  . Pain in right hip 05/13/2017  . Hemoptysis 11/13/2016  . Community acquired pneumonia of left lower lobe of lung ([place]) 11/12/2016  . Lichen planus 26/94/8546  . Type 2 diabetes mellitus without complication, without long-term current use of insulin ([place]) 10/15/2015  . COPD (chronic obstructive pulmonary disease) with chronic bronchitis ([place]) 04/10/2015  . History of colonic polyps 12/28/2014  . Cellulitis 02/09/2013  . Obstruction to urinary outflow 01/02/2011  . Overweight(278.02) 11/11/2009  . GASTROPARESIS 04/15/2009  . CONSTIPATION 04/15/2009  . CORONARY ATHEROSCLEROSIS NATIVE CORONARY ARTERY 02/15/2009  . GASTROENTERITIS 01/01/2009  . Benign prostatic hyperplasia with urinary obstruction 07/29/2008  . MIXED HYPERLIPIDEMIA 07/10/2008  . Osteoarthritis 07/10/2008  . HEARING LOSS 01/11/2008  . Diverticulosis of large intestine 01/11/2008  . COLONIC POLYPS  07/22/2007  . ANEMIA 07/22/2007  . Anxiety state 07/22/2007  . Obstructive sleep apnea 07/22/2007  . Essential hypertension 07/22/2007  . Allergic rhinitis 07/22/2007  . COPD exacerbation ([place]) 07/22/2007  . GERD 07/22/2007  . IRRITABLE BOWEL SYNDROME 07/22/2007  . BACK PAIN, LUMBAR 07/22/2007  . RENAL CALCULUS, HX OF 07/22/2007    Patient's Medications  New Prescriptions   No medications on file  Previous Medications   ACETAMINOPHEN (TYLENOL) 500 MG TABLET    Take 500 mg by mouth every 6 (six) hours as needed for headache (pain).    AMOXICILLIN-CLAVULANATE (AUGMENTIN) 875-125 MG TABLET    Take 1 tablet by mouth 2 (two) times daily.   ASPIRIN EC 81 MG TABLET    Take 81 mg by mouth at bedtime.   ATENOLOL (TENORMIN) 25 MG TABLET    Take 1 tablet (25 mg total) by mouth daily.   CHOLECALCIFEROL (VITAMIN D) 2000 UNITS CAPS    Take 2,000 Units by mouth daily.    CLOTRIMAZOLE (MYCELEX) 10 MG TROCHE    Take 10 mg by mouth daily.    COMPRO 25 MG SUPPOSITORY    PLACE 1 SUPPOSITORY (25 MG TOTAL) RECTALLY EVERY 12 (TWELVE) HOURS AS NEEDED FOR NAUSEA.   DEXTROMETHORPHAN-GUAIFENESIN (MUCINEX DM) 30-600 MG PER 12 HR TABLET    Take 1 tablet by mouth every 12 (twelve) hours.   FLUTICASONE (FLONASE) 50 MCG/ACT NASAL SPRAY    Place 2 sprays into both nostrils daily.   FLUTICASONE-SALMETEROL (ADVAIR HFA) 115-21 MCG/ACT INHALER    Inhale 1 puff into the lungs 2 (two) times daily.   FOLIC ACID (FOLVITE) 1 MG TABLET    Take 1 mg by mouth daily with  lunch.    HYOSCYAMINE (LEVSIN SL) 0.125 MG SL TABLET    Place 1 tablet (0.125 mg total) under the tongue every 4 (four) hours as needed. As needed for abdominal cramping   LIDOCAINE (ASPERCREME W/LIDOCAINE) 4 % CREAM    Apply 1 application topically daily as needed (pain).    LORATADINE (CLARITIN) 10 MG TABLET    Take 10 mg by mouth daily as needed for allergies.   LOSARTAN (COZAAR) 50 MG TABLET    Take 1 tablet (50 mg total) by mouth daily.   METFORMIN  (GLUCOPHAGE) 500 MG TABLET    Take 1 tablet (500 mg total) by mouth 2 (two) times daily with a meal.   METHOTREXATE (RHEUMATREX) 2.5 MG TABLET    Take 3 tablets (7.5 mg total) by mouth See admin instructions. Take 3 tablets (7.5 mg) by mouth on Tuesdays with lunch, starting 11/24/2016   MULTIPLE VITAMINS-MINERALS (MULTIVITAMIN WITH MINERALS) TABLET    Take 1 tablet by mouth daily.     NIACIN (NIASPAN) 500 MG CR TABLET    Take 1 tablet (500 mg total) by mouth at bedtime.   NITROGLYCERIN (NITROSTAT) 0.4 MG SL TABLET    Place 1 tablet (0.4 mg total) under the tongue every 5 (five) minutes as needed for chest pain.   ONE TOUCH ULTRA TEST TEST STRIP    TEST BLOOD SUGAR ONCE A DAY AS DIRECTED (DX E11.9)   PANTOPRAZOLE (PROTONIX) 40 MG TABLET    Take 1 tablet (40 mg total) by mouth daily.   POLYETHYLENE GLYCOL POWDER (GLYCOLAX/MIRALAX) POWDER    TAKE 17 GRAMS BY MOUTH TWICE DAILY AS NEEDED   PREDNISONE (DELTASONE) 10 MG TABLET    Take 2 tablets (20 mg total) by mouth daily with breakfast. Wean off as symptoms resolve   PROBIOTIC PRODUCT (PROBIOTIC PO)    Take 1 tablet by mouth daily.   SODIUM CHLORIDE (OCEAN) 0.65 % NASAL SPRAY    Place 1 spray into the nose 3 (three) times daily as needed for congestion.    TACROLIMUS (PROGRAF) 1 MG CAPSULE    Take 1 mg by mouth See admin instructions. Dissolve 1 capsule (1 mg) in 500 ml of water and keep in refrigerator - swish and spit small amount twice daily   TAMSULOSIN (FLOMAX) 0.4 MG CAPS CAPSULE    Take 1 capsule (0.4 mg total) by mouth 2 (two) times daily.   TRAMADOL (ULTRAM) 50 MG TABLET    TAKE ONE TABLET BY MOUTH THREE TIMES A DAY AS NEEDED FOR PAIN  Modified Medications   No medications on file  Discontinued Medications   No medications on file    HPI: Jesse Santos is a 81 y.o. male former smoker with a history of COPD, recurrent pneumonia, recurrent sinusitis, allergic rhinosinusitis and oral lichen planus. He has been on methotrexate and Prograf for  his lichen planus for the past 2 years with only partial relief of his tongue burning and irritation. He was hospitalized in March with left lower lobe community-acquired pneumonia. He was put on prednisone taper by his orthopedic surgeon because of pain related to his degenerative arthritis.  He was seen by Dr. Lenna Gilford on 05/27/2017 complaining of a "flulike illness". He said that he felt like he was "taking a cold". He had a cough productive of clear sputum at that time. He does not recall having any fever, chills or shortness of breath. A chest x-ray was performed which showed no active disease. Sputum culture was obtained  which showed moderate gram-positive cocci in pairs and a few gram-negative rods on Gram stain. Normal bacterial flora was grown but cultures also revealed Aspergillus flavus, Aspergillus fumigatus and cryptococcus laurentii. At the time of that visit he was started on levofloxacin and started to improve. Once the culture results came back Magic mouthwash was also added. Sputum culture was repeated on 06/22/2017 and grew Cryptococcus neoformans and a second fungal species which is awaiting final identification.  He says that he feels much better now. He has a little bit of pain in his right ear but no change in his chronic hearing loss. He is not bothered with any recent sinus congestion. He has only minimal cough now and no shortness of breath. He has not had any fever, chills or sweats.  Review of Systems: Review of Systems  Constitutional: Positive for malaise/fatigue. Negative for chills, diaphoresis, fever and weight loss.  HENT: Positive for hearing loss. Negative for congestion and sore throat.        He notes burning and stinging of his tongue, especially along the left edge when eating certain foods.  Respiratory: Positive for cough and sputum production. Negative for shortness of breath.   Cardiovascular: Negative for chest pain.  Gastrointestinal: Negative for abdominal  pain, diarrhea, heartburn, nausea and vomiting.  Genitourinary: Negative for dysuria and frequency.  Musculoskeletal: Positive for joint pain. Negative for myalgias.  Skin: Negative for rash.  Neurological: Negative for dizziness and headaches.      Past Medical History:  Diagnosis Date  . Allergic rhinitis, cause unspecified   . Anemia   . Anxiety   . Benign neoplasm of colon   . BPH (benign prostatic hypertrophy)   . COPD (chronic obstructive pulmonary disease) ([place])   . Coronary artery disease    Coronary calcification with negative ETT 2010;   ETT-Myoview (02/2014):  Normal stress nuclear study. No evidence of ischemia.  LV Ejection Fraction: 69%  . Degenerative joint disease   . Diverticulosis of colon (without mention of hemorrhage)   . Esophageal reflux   . Hearing loss   . Hyperplastic colon polyp 02/12/2011  . Irritable bowel syndrome   . Lumbar back pain   . Mixed hyperlipidemia   . Obstructive sleep apnea (adult) (pediatric)   . Oral lichen planus   . Renal calculus   . Type II or unspecified type diabetes mellitus without mention of complication, not stated as uncontrolled   . Unspecified essential hypertension     Social History  Substance Use Topics  . Smoking status: Former Smoker    Packs/day: 1.00    Years: 25.00    Types: Cigarettes    Quit date: 09/15/1979  . Smokeless tobacco: Former Systems developer    Quit date: 09/15/1979  . Alcohol use No    Family History  Problem Relation Age of Onset  . Heart disease Father 3  . Hypertension Brother   . Stroke Mother 63  . Diabetes Mother   . Colon cancer Neg Hx    Allergies  Allergen Reactions  . Prednisone Other (See Comments)    REACTION: high dose intolerance - causes numbness from waist down - low dose tapered course ok    OBJECTIVE: Vitals:   07/13/17 1411  BP: (!) 146/71  Pulse: 74  Temp: 98.3 F (36.8 C)  TempSrc: Oral  Weight: 216 lb (98 kg)   Body mass index is 27.92 kg/m.   Physical Exam    Constitutional:  He is in good spirits. He  is accompanied by his wife, Barbaraann Share.  HENT:  Mouth/Throat: No oropharyngeal exudate.  The left side of his tongue is smooth and lacks papillae. He has full dentures. He is wearing bilateral hearing aids. He has a normal amount of dark cerumen in both external canals. His tympanic membranes appear normal bilaterally.  Eyes: Conjunctivae are normal.  Neck: Neck supple.  Cardiovascular: Normal rate and regular rhythm.   Murmur heard. Early 1/6 systolic murmur.  Pulmonary/Chest: Effort normal and breath sounds normal. He has no wheezes. He has no rales.  Abdominal: Soft. He exhibits no distension. There is no tenderness.  Musculoskeletal: Normal range of motion. He exhibits no edema or tenderness.  Neurological: He is alert. Gait normal.  Skin: No rash noted.  Psychiatric: Mood and affect normal.    Microbiology: No results found for this or any previous visit (from the past 240 hour(s)).  Michel Bickers, MD [facility] [phone] pager   [phone] cell 07/13/2017, 3:01 PM

## 2017-07-14 ENCOUNTER — Telehealth: Payer: Self-pay | Admitting: Pulmonary Disease

## 2017-07-14 NOTE — Telephone Encounter (Signed)
Spoke with Hetty Blend at pharmacy, states pt is requesting a shingles vaccine.  States they want to get clearance from SN d/t pt's current medication list.  SN please advise if ok to administer shingles vaccine.  Thanks!

## 2017-07-15 NOTE — Telephone Encounter (Signed)
[facility] pharmacy but waited on the phone for over 5 mins. Will try again later.

## 2017-07-15 NOTE — Telephone Encounter (Signed)
Per SN---ok for the shingrix vaccine.  1st shot now and the 2nd dose in about 3 months.  Thanks

## 2017-07-15 NOTE — Telephone Encounter (Signed)
Left a detailed message on Jesse Santos voicemail letting them know CY's recommendations for shingles vaccine. Nothing further is needed.

## 2017-07-21 LAB — FUNGUS CULTURE W SMEAR
MICRO NUMBER:: 81123121
SMEAR:: NONE SEEN
SPECIMEN QUALITY: ADEQUATE

## 2017-08-02 DIAGNOSIS — E119 Type 2 diabetes mellitus without complications: Secondary | ICD-10-CM | POA: Diagnosis not present

## 2017-08-02 DIAGNOSIS — Z7984 Long term (current) use of oral hypoglycemic drugs: Secondary | ICD-10-CM | POA: Diagnosis not present

## 2017-08-02 DIAGNOSIS — Z961 Presence of intraocular lens: Secondary | ICD-10-CM | POA: Diagnosis not present

## 2017-08-02 DIAGNOSIS — H524 Presbyopia: Secondary | ICD-10-CM | POA: Diagnosis not present

## 2017-08-04 DIAGNOSIS — B370 Candidal stomatitis: Secondary | ICD-10-CM | POA: Diagnosis not present

## 2017-08-04 DIAGNOSIS — Z5181 Encounter for therapeutic drug level monitoring: Secondary | ICD-10-CM | POA: Diagnosis not present

## 2017-08-04 DIAGNOSIS — Z79899 Other long term (current) drug therapy: Secondary | ICD-10-CM | POA: Diagnosis not present

## 2017-08-04 DIAGNOSIS — L438 Other lichen planus: Secondary | ICD-10-CM | POA: Diagnosis not present

## 2017-09-20 ENCOUNTER — Other Ambulatory Visit (INDEPENDENT_AMBULATORY_CARE_PROVIDER_SITE_OTHER): Payer: Self-pay | Admitting: Orthopedic Surgery

## 2017-09-20 IMAGING — DX DG CHEST 2V
2 series · 2 of 2 positions shown · non-contrast
Comparison: Chest x-ray of 11/18/2016

CLINICAL DATA: History of pneumonia, followup, former smoking
history

EXAM:
CHEST  2 VIEW

[chest pa]
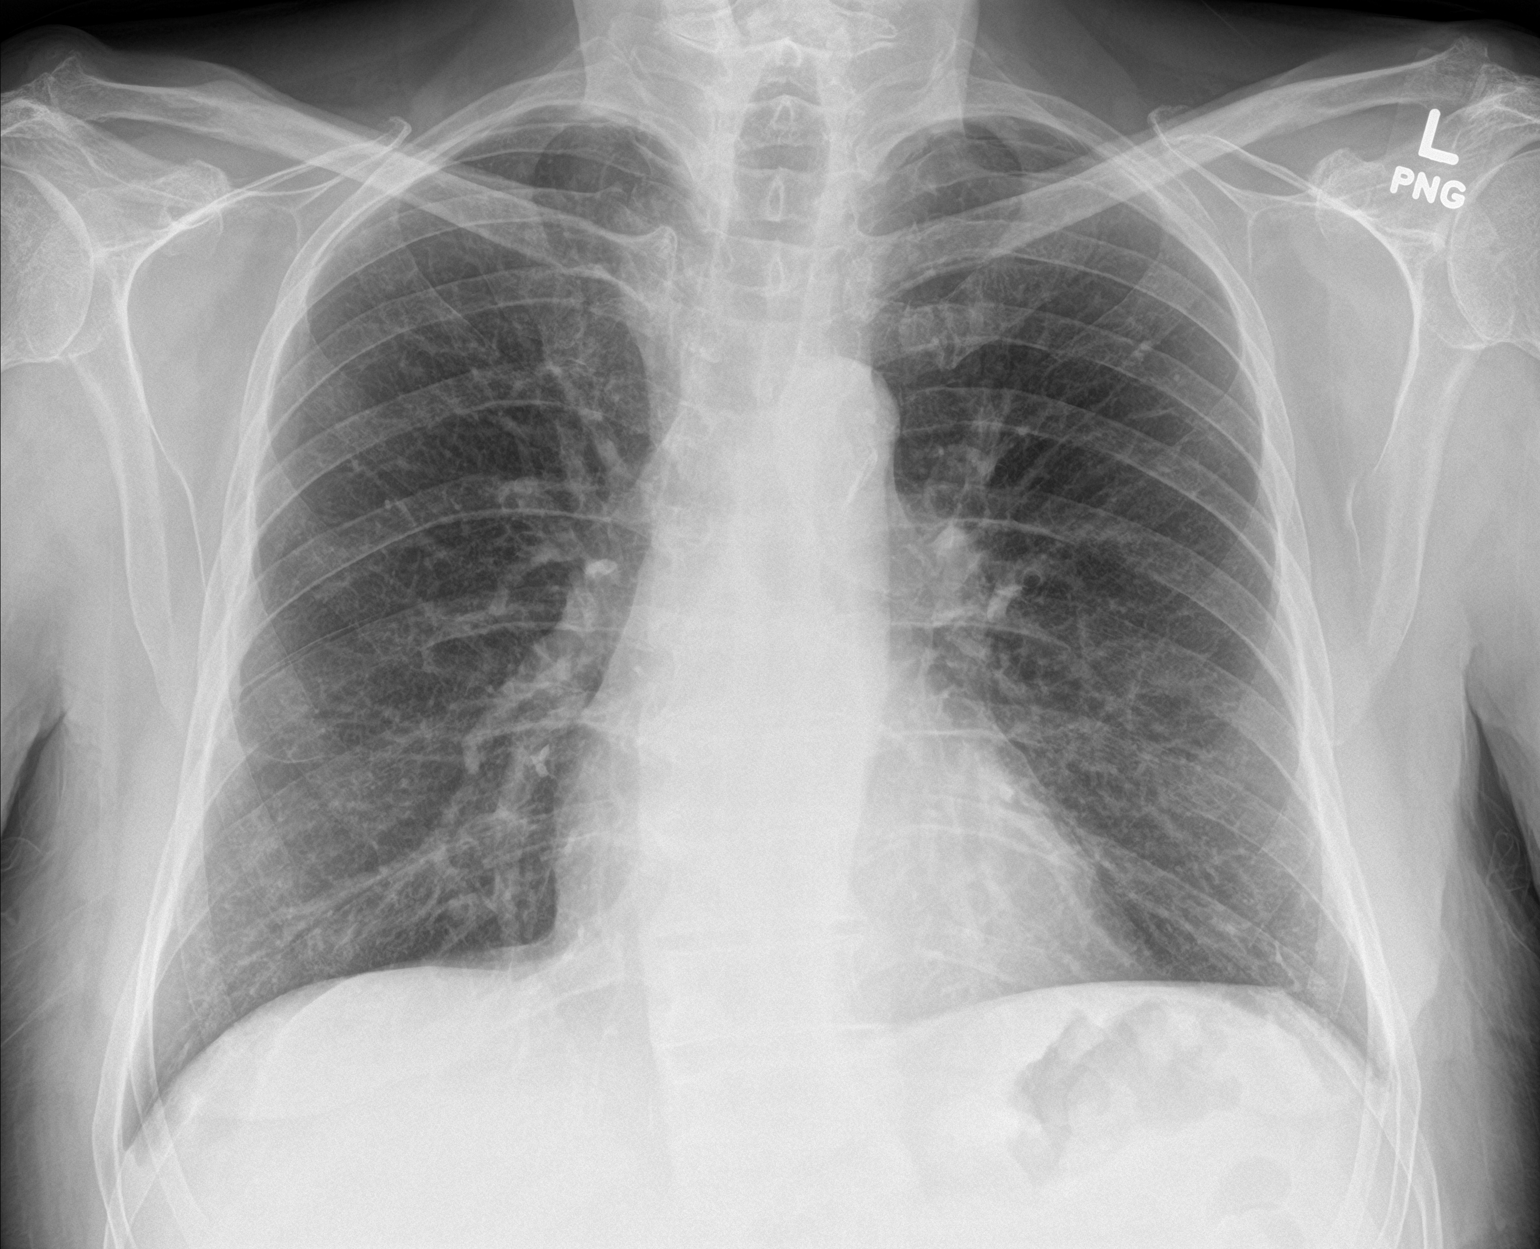

[chest lat]
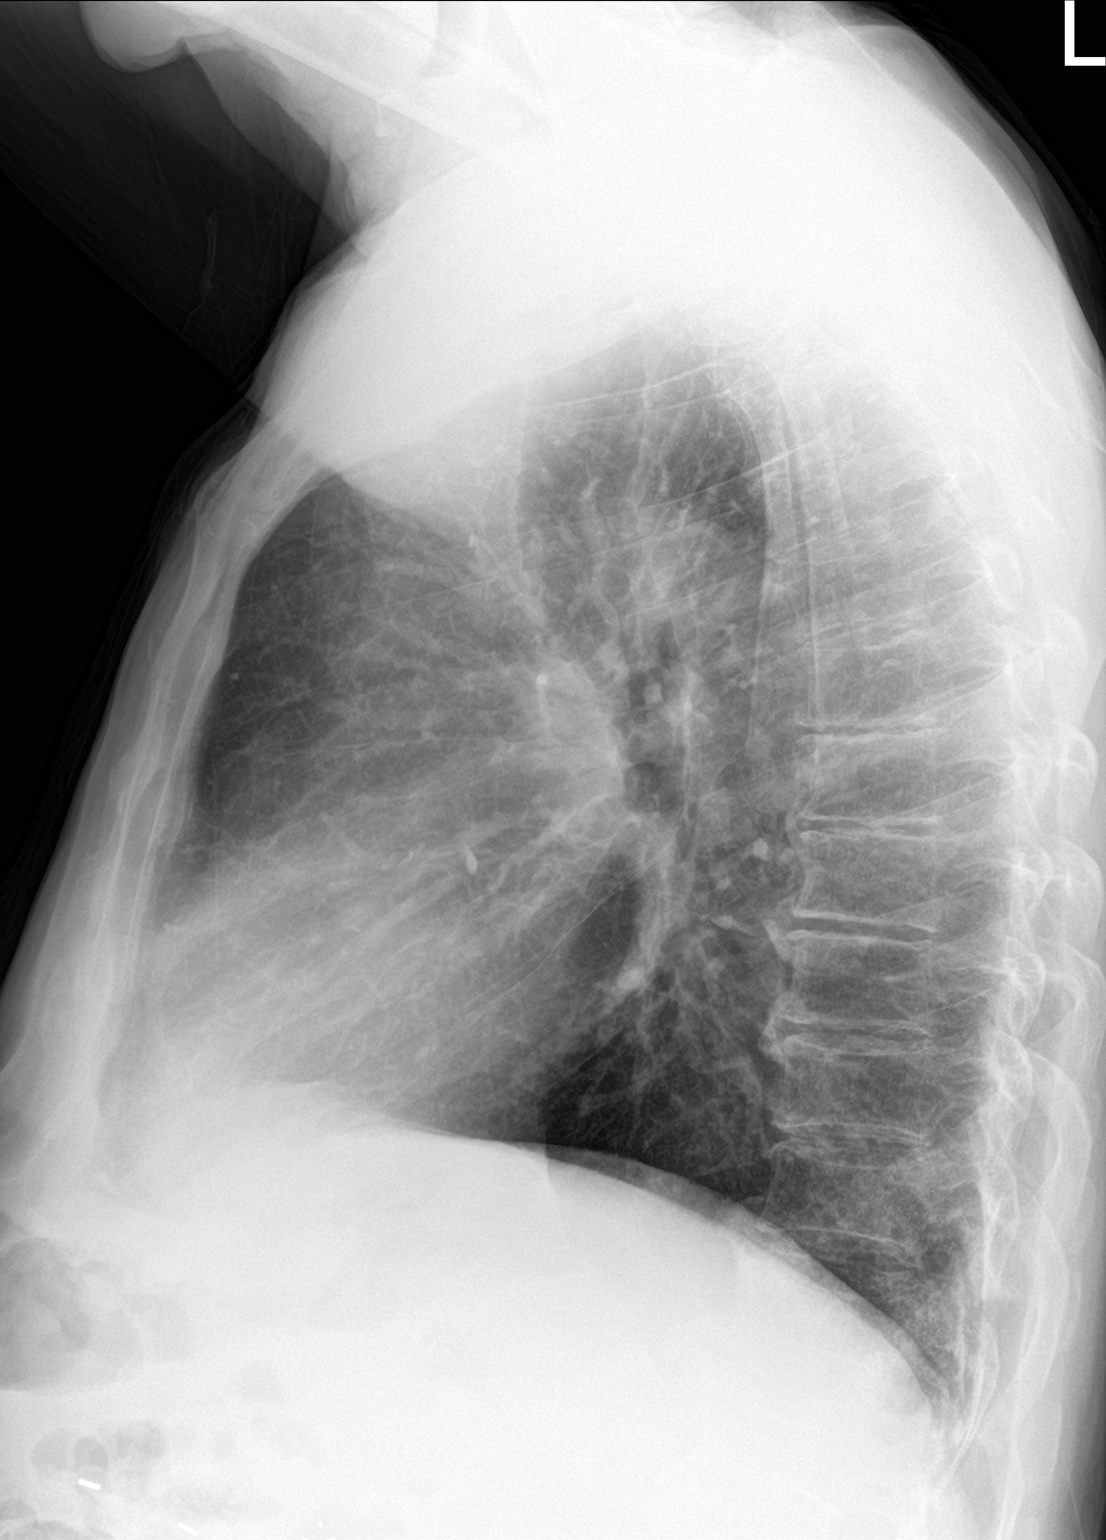

[2 of 2 positions shown; findings below may reference images not displayed]

FINDINGS: No residual pneumonia is seen. No pleural effusion is noted.
Mediastinal and hilar contours are unremarkable. The heart is within
normal limits in size. There are degenerative changes in the mid to
lower thoracic spine.
IMPRESSION: No active cardiopulmonary disease.

## 2017-10-08 ENCOUNTER — Telehealth: Payer: Self-pay | Admitting: Pulmonary Disease

## 2017-10-08 MED ORDER — METRONIDAZOLE 250 MG PO TABS: 250.0000 mg | ORAL_TABLET | Freq: Three times a day (TID) | ORAL | 0 refills | Status: DC

## 2017-10-08 NOTE — Telephone Encounter (Signed)
Spoke with patient. He stated that for the past 3 weeks, he has had nasal drainage that appears to be yellowish green in color. He has also developed diarrhea that started this morning. Denies any fever but does feel more tired and has body aches.   He wants to know if SN could call in something for him.   He wishes to use [facility] [address].   SN, please advise. Thanks!

## 2017-10-08 NOTE — Telephone Encounter (Signed)
Per SN: Lets try flagyl (generic) 250mg  #21. One tablet po TID.   Also take probiotic daily (ie: Align) and OTC imodium q6h for watery diarrhea.   Pt called and informed. Nothing further needed.

## 2017-10-18 ENCOUNTER — Encounter: Payer: Self-pay | Admitting: Pulmonary Disease

## 2017-10-18 ENCOUNTER — Ambulatory Visit (INDEPENDENT_AMBULATORY_CARE_PROVIDER_SITE_OTHER): Payer: Medicare Other | Admitting: Pulmonary Disease

## 2017-10-18 ENCOUNTER — Other Ambulatory Visit (INDEPENDENT_AMBULATORY_CARE_PROVIDER_SITE_OTHER): Payer: Medicare Other

## 2017-10-18 VITALS — BP 118/68 | HR 81 | Temp 97.9°F | Ht 73.0 in | Wt 215.0 lb

## 2017-10-18 DIAGNOSIS — E782 Mixed hyperlipidemia: Secondary | ICD-10-CM | POA: Diagnosis not present

## 2017-10-18 DIAGNOSIS — N138 Other obstructive and reflux uropathy: Secondary | ICD-10-CM | POA: Diagnosis not present

## 2017-10-18 DIAGNOSIS — J329 Chronic sinusitis, unspecified: Secondary | ICD-10-CM

## 2017-10-18 DIAGNOSIS — L439 Lichen planus, unspecified: Secondary | ICD-10-CM

## 2017-10-18 DIAGNOSIS — I2510 Atherosclerotic heart disease of native coronary artery without angina pectoris: Secondary | ICD-10-CM

## 2017-10-18 DIAGNOSIS — E119 Type 2 diabetes mellitus without complications: Secondary | ICD-10-CM

## 2017-10-18 DIAGNOSIS — J449 Chronic obstructive pulmonary disease, unspecified: Secondary | ICD-10-CM

## 2017-10-18 DIAGNOSIS — J4489 Other specified chronic obstructive pulmonary disease: Secondary | ICD-10-CM

## 2017-10-18 DIAGNOSIS — R931 Abnormal findings on diagnostic imaging of heart and coronary circulation: Secondary | ICD-10-CM

## 2017-10-18 DIAGNOSIS — F411 Generalized anxiety disorder: Secondary | ICD-10-CM

## 2017-10-18 DIAGNOSIS — Z8701 Personal history of pneumonia (recurrent): Secondary | ICD-10-CM | POA: Diagnosis not present

## 2017-10-18 DIAGNOSIS — N401 Enlarged prostate with lower urinary tract symptoms: Secondary | ICD-10-CM

## 2017-10-18 DIAGNOSIS — I10 Essential (primary) hypertension: Secondary | ICD-10-CM

## 2017-10-18 DIAGNOSIS — K3184 Gastroparesis: Secondary | ICD-10-CM | POA: Diagnosis not present

## 2017-10-18 LAB — COMPREHENSIVE METABOLIC PANEL
ALT: 22 U/L (ref 0–53)
AST: 23 U/L (ref 0–37)
Albumin: 3.7 g/dL (ref 3.5–5.2)
Alkaline Phosphatase: 64 U/L (ref 39–117)
BUN: 16 mg/dL (ref 6–23)
CHLORIDE: 107 meq/L (ref 96–112)
CO2: 27 mEq/L (ref 19–32)
Calcium: 9.2 mg/dL (ref 8.4–10.5)
Creatinine, Ser: 0.78 mg/dL (ref 0.40–1.50)
GFR: 101.77 mL/min (ref >60.00)
Glucose, Bld: 140 mg/dL — ABNORMAL HIGH (ref 70–99)
POTASSIUM: 4.7 meq/L (ref 3.5–5.1)
SODIUM: 141 meq/L (ref 135–145)
Total Bilirubin: 0.4 mg/dL (ref 0.2–1.2)
Total Protein: 6.4 g/dL (ref 6.0–8.3)

## 2017-10-18 LAB — CBC WITH DIFFERENTIAL/PLATELET
BASOS PCT: 0.8 % (ref 0.0–3.0)
Basophils Absolute: 0.1 10*3/uL (ref 0.0–0.1)
EOS PCT: 1.5 % (ref 0.0–5.0)
Eosinophils Absolute: 0.1 10*3/uL (ref 0.0–0.7)
HEMATOCRIT: 38.5 % — AB (ref 39.0–52.0)
HEMOGLOBIN: 12.8 g/dL — AB (ref 13.0–17.0)
LYMPHS PCT: 19.2 % (ref 12.0–46.0)
Lymphs Abs: 1.2 10*3/uL (ref 0.7–4.0)
MCHC: 33.2 g/dL (ref 30.0–36.0)
MCV: 91.5 fl (ref 78.0–100.0)
Monocytes Absolute: 0.4 10*3/uL (ref 0.1–1.0)
Monocytes Relative: 6.5 % (ref 3.0–12.0)
Neutro Abs: 4.6 10*3/uL (ref 1.4–7.7)
Neutrophils Relative %: 72 % (ref 43.0–77.0)
Platelets: 215 10*3/uL (ref 150.0–400.0)
RBC: 4.21 Mil/uL — ABNORMAL LOW (ref 4.22–5.81)
RDW: 13.6 % (ref 11.5–15.5)
WBC: 6.4 10*3/uL (ref 4.0–10.5)

## 2017-10-18 LAB — LIPID PANEL
Cholesterol: 128 mg/dL (ref 0–200)
HDL: 49.4 mg/dL (ref >39.00)
LDL CALC: 67 mg/dL (ref 0–99)
NONHDL: 78.75
Total CHOL/HDL Ratio: 3
Triglycerides: 59 mg/dL (ref 0.0–149.0)
VLDL: 11.8 mg/dL (ref 0.0–40.0)

## 2017-10-18 LAB — HEMOGLOBIN A1C: HEMOGLOBIN A1C: 7.2 % — AB (ref 4.6–6.5)

## 2017-10-18 LAB — PSA: PSA: 2.18 ng/mL (ref 0.10–4.00)

## 2017-10-18 LAB — TSH: TSH: 0.76 u[IU]/mL (ref 0.35–4.50)

## 2017-10-18 MED ORDER — HYOSCYAMINE SULFATE 0.125 MG SL SUBL: 0.1250 mg | SUBLINGUAL_TABLET | SUBLINGUAL | 5 refills | Status: AC | PRN

## 2017-10-18 MED ORDER — POLYETHYLENE GLYCOL 3350 17 GM/SCOOP PO POWD: ORAL | 5 refills | Status: DC

## 2017-10-18 NOTE — Progress Notes (Signed)
Subjective:    Patient ID: Jesse Santos, male    DOB: 1935-09-29, 82 y.o.   MRN: [id number]  HPI 82 y/o WM w/ mult med problems here for a follow up visit>  ~  SEE PREV EPIC NOTES FOR OLDER DATA >>     LABS 1/14:  FLP- at goals on Niacin rx;  Chems- ok w/ BS=134, A1c=7.5;  CBC- wnl;  TSH=1.13;  VitD=42;  PSA=1.10      LABS 7/14:  Chems- ok x BS=140, A1c=7.3...   1/15> Jesse Santos reports that DrDuda plans a left THR (for avasc necreosis) whenever he is ready, but he's not rushing it, on Tramadol prn & doing satis for now...   LABS 1/15:  FLP- at goals on diet+Niaspan;  Chesm- ok w/ BS=129, A1c=7.0;  CBC- wnl;  TSH=1.06...  EKG 4/15 showed NSR, rate70, wnl, NAD...  Treadmill 6/15 (DrBrackbill) showed borderline ST segment changes w/ exercise & a run of atrial tachy reported...   Myoview 6/15 showed good exerc capacity, norm BP response, no CP, no evid ischemia, decr uptake inferiorly on rest images similar to 2013, EF=69%, norm wall motion...  LABS 7/15:  Chems- ok w/ BS=126, A1c=7.1.Marland KitchenMarland Kitchen rec to continue Metform500Bid & get on diet, incr exerc, get wt down!!!   ~  October 10, 2014:  59moROV & Jesse Santos indicates it took several wks to get over his URI/AB episode in Oct;      On Advair115-2spBid, Mucinex, flonase, claritin; he reports breathing is stable- min cough, no sput, stable DOE, no edema...     He remains on Niasp500/d; FLP 1/16 showed TChol 150, TG 101, HDL 45, LDL 85    He continues Metform500Bid for his DM; Labs 1/16 showed BS=133, A1c=7.2    He has some lower abd pain/ cramping & has used the Levsin w/ relief- c/w IBS, some diarrhea, nausea, groin discomfort; he had colonoscopy 2012 w/ 2 polyps removed & f/u planned 2017...     He sees DrDuda for Ortho & takes Tramadol50 aS NEEDED... We reviewed prob list, meds, xrays and labs> see below for updates >>   CXR 1/16 showed norm heart size, clear lungs, DJD in Tspine, NAD...  LABS 1/16:  FLP- at goals on Niasp;  Chems- ok w/ BS=133  A1c=7.2;  CBC- wnl;  TSH=1.59;  PSA=1.25...   ~  April 10, 2015:  645moOV & Jesse Santos reports stable w/ CC= arthritis pain in shoulders/ hips, he sees DrDuda, told he needs left THR but he is holding off, Tramadol helps;  We reviewed the following medical problems during today's office visit >>    HOH> he has bilat hearing aides...    AR> on allergy shots, OTC antihist, Saline, Flonase; gets occas bouts of sinusitis but overall improved & no recent infections reported...     OSA> Sleep Study was in 2/04 w/ RDI=45 & desat to 88% w/ mod snoring & leg jerks, but he states he never could use the CPAP effectively & states he sleeps fine now if he stays on his side; denies daytime sleepiness or other issues...    COPD> exsmoker quit 1981; on AdvairHFA115-2spBid, Mucinex prn; he doesn't like Pred Rx; he had Bronchitic exac treated 10/15 w/ Levaquin, Medrol, Mucinex, etc & improved; needs incr exercise program...    HBP> on Aten25, Losar100; BP=120/70, he denies CP, palpit, dizzy, SOB, edema, etc...    CAD> on ASA81; followed by DrHochrein- CT Abd 2010 showed coronary calcif, Treadmill was neg; Myoview 3/13 was neg,  wnl; last seen 4/15- no new symptoms, active w/ yard work, EKG showed wnl, they decided to do Treadmill (borderline ST depression), then Myoview 6/15 showed NEG- no ischemia, decr uptake inferiorly same as 2013, EF=69%, norm wall motion, low risk...    Hyperlipid> on Niacin'500mg'$ ; FLP 1/16 showed TChol 150, TG 101, HDL 45, LDL 85; continue same, get wt down...    DM> on Metform500Bid; wt=222#, BMI=29; Labs 7/16 showed BS=129, A1c=6.7; rec to continue same med, better diet, get wt down!..    GI- GERD, dysphagia, Divertics, IBS, Polyps> on Protonix40, Miralax, Levsin0.125 prn; followed by DrPerry & stable- last colon 5/12 w/ severe divertics & 2 sm adenomas removed...    GU- Kid stone, BPH> on Flomax0.4 & Cialis prn; PSA remains wnl (PSA 1/16= 1.25)...    DJD, LBP> left knee complaints w/ shots from DrDuda,  similar for left shoulder; hx bilat hip AVN noted on prev CT Abd but he has min complaints and uses Tylenol prn (holding off on surg)...    Anxiety> he does not want anxiolytic meds.[name] EXAM shows Afeb, VSS, O2sat=97%;  Heent- neg, mallampati2;  Chest- clear w/o w/r/r;  Heart- RR w/o m/r/g;  Abd- soft, neg, nontender;  Ext- neg w/o c/c/e;  Neuro- intact...  Labs 7/16>  Chems- ok w/ BS=129, A1c=6.7, Cr=0.83... IMP/PLAN>>  Jesse Santos is stable, meds refilled, he's already had the 2016 Flu vaccine & up to date;  We discussed diet/ exercise/ wt reduction... ROV in 55mo  ~  October 15, 2015:  641moOV & Jesse Santos saw TP 07/2015 for a lesion on tongue- sent to Derm DrLupton ?lichenoid mucositis (rx w/ several steroid shots & topical meds) & may need referral to [facility]. He has mult somatic complaints> LBP, leg weakness, HAs, cough/ congestion/ clear mucous (yet he states exercise on treadmill for 3054mat 2.6mp58m.. We reviewed the following medical problems during today's office visit >>     HOH> he has bilat hearing aides...    AR> on allergy shots per DrVanWinkle, OTC antihist, Saline, Flonase; gets occas bouts of sinusitis but overall improved & no recent infections reported...     OSA> Sleep Study 2/04 w/ RDI=45 & desat to 88% w/ mod snoring & leg jerks, but he states he never could use the CPAP effectively & states he sleeps fine now if he stays on his side; denies daytime sleepiness or other issues...    COPD> exsmoker quit 1981; on AdvairHFA115-2spBid, Mucinex prn; he doesn't like Pred Rx; prev Bronchitic exac treated w/ Levaquin, Medrol, Mucinex & improved; needs incr exercise program...    HBP> on Aten25, Losar100; BP=126/70, he denies CP, palpit, dizzy, SOB, edema, etc...    CAD> on ASA81; followed by DrHochrein- CT Abd 2010 showed coronary calcif, Treadmill was neg; Myoview 3/13 was neg, wnl; last seen 4/15- no new symptoms, active w/ yard work, EKG showed wnl, they decided to do Treadmill (borderline ST  depression), then Myoview 6/15 showed NEG- no ischemia, decr uptake inferiorly same as 2013, EF=69%, norm wall motion, low risk...    Hyperlipid> on Niacin'500mg'$ ; FLP 1/17 showed TChol 132, TG 52, HDL 46, LDL 75; continue same, get wt down...    DM> on Metform500Bid; wt=220#, BMI=29; Labs 7/16 showed BS=129, A1c=6.7; rec to continue same med, better diet, get wt down; Labs 1/17 showed BS=134, A1c=6.8...  Marland KitchenMarland KitchenGI- GERD, dysphagia, Divertics, IBS, Polyps> on Protonix40, Miralax, Levsin0.125 prn; followed by DrPerry & stable- last colon 5/12 w/ severe divertics & 2 sm adenomas  removed...    GU- Kid stone, BPH> on Flomax0.4 & Cialis prn; PSA remains wnl (PSA 1/16= 1.25, 1/17=1.06)...    DJD, LBP> left knee complaints w/ shots from DrDuda, similar for left shoulder; hx bilat hip AVN noted on prev CT Abd but he has min complaints and uses Tylenol prn (holding off on surg)...    Anxiety> he does not want anxiolytic meds.[name] EXAM shows Afeb, VSS, O2sat=98%;  Heent- neg, mallampati2;  Chest- clear w/o w/r/r;  Heart- RR w/o m/r/g;  Abd- soft, neg, nontender;  Ext- neg w/o c/c/e;  Neuro- intact...  LABS 10/15/15>  FLP- at goals on diet + niacin;  Chems- ok x BS=134, A1c=6.8 on Metform500bid;  CBC- wnl;  TSH=1.08;  PSA=1.06 IMP/PLAN>>  Jesse Santos has mult somatic complaints but objectively in good shape; we discussed ENT 2nd opinion re tongue lesion vs Med Center referral per Derm, he will decide; continue present Rx & ROV 34mo..  ~  April 13, 2016:  667moOV and after the last visit Jesse Santos saw DrShoemaker, ENT 10/29/15 for the lesion on his tongue- his note is reviewed in Care Everywhere, he concurred w/ topical steroid therapy for this chronic tongue irritation & gave him Triamcinolone cream;  DrLupton [facility]/16 7 note reviewed, bx proven lichenoid dermatitis, exam showed large erosions on lat border of tongue c/w oral erosive lichen planus; He rec Diflucan, Mycelex (for prob candida) &  Tacrolimus (Prograf) to dissolve in water & swish as directed=> pt indicates that he is improved, and they are planning f/u eval... we reviewed the following medical problems during today's office visit >>     HOH> he has bilat hearing aides & has been checked by DrShoemaker.    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    AR> on allergy shots per DrVanWinkle, OTC antihist, Saline, Flonase; gets occas bouts of sinusitis but overall improved & no recent infections reported...     OSA> Sleep Study 2/04 w/ RDI=45 & desat to 88% w/ mod snoring & leg jerks, but intol to CPAP & never used; states he sleeps fine now if he stays on his side; denies daytime sleepiness or other issues, naps 1/7...    COPD> exsmoker quit 1981; on AdvairHFA115-2spBid w/ spacer, Mucinex prn; he doesn't like Pred Rx; prev Bronchitic exac treated w/ antibiotics (Levaquin/ Augmentin), Medrol, Mucinex & improved; needs incr exercise program...    HBP> on Aten25, Losar100; BP=142/68, he denies CP, palpit, dizzy, SOB, edema, etc...    CAD> on ASA81; followed by DrHochrein- seen 12/19/15, CT Abd 2010 showed coronary calcif, Treadmill was neg (borderline ST depression); Myoview 3/13 was neg, wnl & Myoview 6/15 showed NEG- no ischemia, decr uptake inferiorly same as 2013, EF=69%, norm wall motion, low risk... He denies CP, palpit, dizzy, SOB, edema, etc; he remains active w/ yard work etc...    Hyperlipid> on Niacin50026mFLP 1/17 showed TChol 132, TG 52, HDL 46, LDL 75; continue same, get wt down...    DM> on Metform500Bid; wt=218#, BMI=29; Labs 7/16 showed BS=129, A1c=6.7; rec to continue same med, better diet, get wt down; Labs 1/17 showed BS=134, A1c=6.8 and A1c 04/13/16= 6.7, stable...    GI- GERD, dysphagia, Divertics, IBS, Polyps> on Protonix40, Miralax, Levsin0.125 prn; followed by DrPerry & stable- last colon 5/12 w/ severe divertics & 2 sm adenomas  removed...    GU- Kid stone, BPH> on Flomax0.4 &  Cialis prn; PSA remains wnl (PSA 1/16= 1.25, 1/17=1.06)...    DJD, LBP> left knee complaints w/ shots from DrDuda, similar for left shoulder; hx bilat hip AVN noted on prev CT Abd but he has min complaints and uses Tylenol prn (holding off on surg)...    Anxiety> he does not want anxiolytic meds.[name] EXAM shows Afeb, VSS, O2sat=98%;  Heent- neg, mallampati2;  Chest- clear w/o w/r/r;  Heart- RR w/o m/r/g;  Abd- soft, neg, nontender;  Ext- neg w/o c/c/e;  Neuro- intact...  LABS 04/13/16:  BMet- ordered but not done;  A1c=6.7.Marland KitchenMarland Kitchen IMP/PLAN>>  Jesse Santos is stable w/ his mult medical issues as above- oral lichen planus improved on regimen from Derm at [facility]; BP controlled, he remains active w/o angina, FLP & DM well regualted=> continue same meds.  ~  October 15, 2016:  41moROV & Jesse Santos was seen by SG 07/2016 & TP 08/2016 w/ cough prod of green mucus, sinus congestion, but no f/c/s; he was placed on MTX by DrJorizzo, [facility] due to his tongue lesion felt to be oral lichen planus, 2=> incr to 3 tabs once per week along w/ Folate, Mycelex and Prograf (1 cap dissolved in water & swished);  He remains on AdvairHFA-2spBid & he's careful to rinse after these treatments too;  He was treated w/ Levaquin + Pred taper and he followed up w/ TP 09/08/16-- improved w/ decr cough & dyspnea, no wheezing, remains on Advair, flonase, claritin, mucinex... we reviewed the following medical problems during today's office visit >>     HOH> he has bilat hearing aides & has been checked by DrShoemaker.    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    AR> on allergy shots per DrVanWinkle, OTC antihist, Saline, Flonase; gets occas bouts of sinusitis but overall improved & no recent infections reported...     OSA> Sleep Study 2/04 w/ RDI=45 & desat to 88% w/ mod  snoring & leg jerks, but intol to CPAP & never used; states he sleeps fine now if he stays on his side; denies daytime sleepiness or other issues, naps 1/7...    COPD> exsmoker quit 1981; on AdvairHFA115-2spBid w/ spacer, Mucinex prn; he doesn't like Pred Rx; prev Bronchitic exac treated w/ antibiotics (Levaquin/ Augmentin), Medrol, Mucinex & improved; needs incr exercise program...    HBP> on Aten25, Losar100; BP=116/60, he denies CP, palpit, dizzy, SOB, edema, etc...    CAD> on ASA81; followed by DrHochrein- seen 12/19/15, CT Abd 2010 showed coronary calcif, Treadmill was neg (borderline ST depression); Myoview 3/13 was neg, wnl & Myoview 6/15 showed NEG- no ischemia, decr uptake inferiorly same as 2013, EF=69%, norm wall motion, low risk... He denies CP, palpit, dizzy, SOB, edema, etc; he remains active w/ yard work etc...    Hyperlipid> on Niacin5039m FLP 1/17 showed TChol 132, TG 52, HDL 46, LDL 75; continue same, get wt down...    DM> on Metform500Bid; wt=216#, BMI=29; Labs 1/17 showed BS=134, A1c=6.8; rec to continue same med, better diet, get wt down; Labs 2/18 showed BS=136, A1c=6.7; stable...    GI- GERD, dysphagia, Divertics, IBS, Polyps> on Protonix40, Miralax, Levsin0.125 prn; followed by DrPerry & stable- last colon 5/12 w/ severe divertics & 2 sm adenomas removed; he wants off PPI=> change to PEPCID40...    GU- Kid stone, BPH> on Flomax0.4 & Cialis prn; PSA remains wnl (PSA 1/16= 1.25, 1/17=1.06, 2/18=1.45)...Marland KitchenMarland Kitchen  DJD, LBP> left knee complaints w/ shots from DrDuda, similar for left shoulder; hx bilat hip AVN noted on prev CT Abd but he has min complaints and uses Tylenol prn (holding off on surg)...    Anxiety> he does not want anxiolytic meds.[name] EXAM shows Afeb, VSS, O2sat=99%;  Wt=216#, 6'2"Tall, BMI=28;  Heent- neg, mallampati2;  Chest- clear w/o w/r/r;  Heart- RR w/o m/r/g;  Abd- soft, neg, nontender;  Ext- neg w/o c/c/e;  Neuro- intact...  CXR 08/10/16>  Norm heart size, atherosclerotic  Ao, clear lungs- NAD, degen changes in Tspine w/ some scoliosis...  LABS 10/15/16>  FLP- at goals on Niaspan;  Chems- ok w/ BS=136, A1c=6.7;  CBC- wnl w/ Hg=13.9, Fe=169 (42%sat), B12=504;  TSH=1.04,  PSA=1.45...  IMP/PLAN>>  Jesse Santos is still dealing w/ his oral prob= Lichen Planus & improved w/ treatment program under the direction of [facility]- on MTX 3/wk, Prograf, Mycelex;  His wife is concerned about his being on Protonix40 for many yrs, followed by DrPerry w/ GERD/ dysphagia, last EGD 2009 was essentially normal- OK to stop the PPI & change to Pepcid40;  Continue other meds the same + diet/ exercise...   ~  November 18, 2016:  38moROV & post hospital visit> DMarden Noblewas hospitalized 3/1 - 11/15/16 by [facility] w/ multilobar community acquired pneumonia (predom lingular w/ CT abn in LLL & RLL as well);  He woke w/ cough, malaise, myalgias, & chills;  Temp in ER was 103, BP was wnl, O2sat was 94% on 2L/min, CXR showed LLL pneumonia, no organism specified, treated w/ rocephin & zithromax=> disch on oral zithromax & medrol taper...     Currently feeling better- sl sore throat, cough, sm amt beige sput, no hemoptysis, +SOB/DOE & still "weak", some chest discomfort=> we decided to f/u CXR/ Labs (see below)...    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    AR> on allergy shots per DrVanWinkle, OTC antihist, Saline, Flonase; gets occas bouts of sinusitis but overall improved & no recent infections reported...     OSA> Sleep Study 2/04 w/ RDI=45 & desat to 88% w/ mod snoring & leg jerks, but intol to CPAP & never used; states he sleeps fine now if he stays on his side; denies daytime sleepiness or other issues, naps 1/7...    COPD> exsmoker quit 1981; on AdvairHFA115-2spBid w/ spacer, Mucinex prn; he doesn't like Pred Rx; prev Bronchitic exac treated w/ antibiotics (Levaquin/ Augmentin),  Medrol, Mucinex & improved; needs incr exercise program... EXAM shows Afeb, VSS, O2sat=99%;  Wt=216#, 6'2"Tall, BMI=28;  Heent- neg, mallampati2;  Chest- clear w/o w/r/r;  Heart- RR w/o m/r/g;  Abd- soft, neg, nontender;  Ext- neg w/o c/c/e;  Neuro- intact...  CXR 11/12/16 showed LLL pneumonia, borderline cardiomegaly, aortic calcif  CT Chest 11/13/16>  Neg for PE, multilobar left lung pneumonia, poss early RLL infiltrate, calcif aortic & coronary arteries...  CXR today 11/18/16 showed norm heart size, aortic atherosclerosis, left lung opacity is resolving/ improved, no effusion etc...  LABS 11/2016>  Chems- ok x BS 110-190;  CBC- ok x WBC 14K=>9K  LABS 11/18/16>  Chems- ok w/ BS=110;  CBC- improved w/ Hg=13.7, WBC=8.5 IMP/PLAN>>  DMarden Noblehas improved from his recent bout of pneumonia (no organism identified); he has finished the antibiotics (Zithromax), & weaning off the Medrol;  We plan ROV recheck in 3-4weeks...   ~  December 09, 2016:  3wk [place] is improved overall "just weak" he says;  BP is good at home on Losar50 reading 130-140/ 60-70 at home;  He denis much cough, sput, no hemoptysis, no CP, SOB improved & now active on his treadmill, doing housework, etc;  His CC is nasal drainage;  F/u CXR today is resolved- NAD...     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    AR> on allergy shots per DrVanWinkle, OTC antihist, Saline, Flonase; gets occas bouts of sinusitis but overall improved & no recent infections reported...     OSA> Sleep Study 2/04 w/ RDI=45 & desat to 88% w/ mod snoring & leg jerks, but intol to CPAP & never used; states he sleeps fine now if he stays on his side; denies daytime sleepiness or other issues, naps 1/7...    COPD> exsmoker quit 1981; on AdvairHFA115-2spBid w/ spacer, Mucinex prn; he doesn't like Pred Rx; prev Bronchitic exac treated w/  antibiotics (Levaquin/ Augmentin), Medrol, Mucinex & improved; needs incr exercise program...    Medical Issues>  HBP, CAD, HL, DM, Gastroparesis, BPH, DJD, anxiety... EXAM shows Afeb, VSS, O2sat=98%;  Wt=211#, 6'2"Tall, BMI=28;  Heent- neg, mallampati2;  Chest- clear w/o w/r/r;  Heart- RR w/o m/r/g;  Abd- soft, neg, nontender;  Ext- neg w/o c/c/e;  Neuro- intact...  CXR 12/09/16 (independently reviewed by me in the PACS system) showed norm heart size, clear lungs- NAD... IMP/PLAN>>  Jesse Santos's pneumonia has resolved & he is approaching his baseline "just weak" he says;  Rec to continue his baseline regimen, diet, exercise, etc...   ~  April 14, 2017:  58moROV & Jesse Santos reports that his Lichen Planus (tongue soreness) is improved and [facility]  He notes that his H2-blocker Pepcid isn't enough for his reflux & feels he needs to go back on Protonix40- ok (take it 360m before dinner);  Breathing is good but it is "dusty at my place" and this bothers his sinuses> prev on allergy shots for 22 yrs and DrESL stopped them when he retired, still uses ClNordstromdenies SOB, CP, etc...  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    AR> prev on allergy shots per DrESL, OTC antihist, Saline, Flonase; gets occas bouts of sinusitis but overall improved but he's concerned not doing as well off the shots- asked to check in w/ DrVanWinkle.    OSA> Sleep Study 2/04 w/ RDI=45 & desat to 88% w/ mod snoring & leg jerks, but intol to CPAP & never used; states he sleeps fine now if he stays on his side; denies daytime sleepiness or other issues, naps 1/7...    COPD> exsmoker quit 1981; on AdvairHFA115-2spBid w/ spacer, Mucinex prn; he doesn't like Pred Rx; prev Bronchitic exac  treated w/ antibiotics (Levaquin/ Augmentin), Medrol, Mucinex & improved; needs incr exercise program...    Medical Issues>  HBP (controlled), CAD (DrHochrein last 4/17), HL (on Niaspan, FLP ok 2/18), DM (on Metform, Labs 2/18 w/ BS-167 A1c-6.7) , Gastroparesis, BPH, DJD, anxiety... EXAM shows Afeb, VSS, O2sat=99%;  Wt=215#, 6'2"Tall, BMI=28;  Heent- neg, mallampati2;  Chest- clear w/o w/r/r;  Heart- RR w/o m/r/g;  Abd- soft, neg, nontender;  Ext- neg w/o c/c/e;  Neuro- intact... IMP/PLAN>>  OK back on Protonix40 taken 74mn before dinner;  Continue same meds and check back w/ DrVanWinkle regarding allergy shots;  OK- refills today & we reviewed diet & exercise...  ~  May 27, 2017:  6wk RWashtucnahas seen Ortho-DrDuda for back pain & right hip pain, XRays showed arthritis; Tramadol provided some temporary relief, they decided to try a course of Pred w/ rov to assess for MRI & need for surg... Pt called last wk stating that he's seen DrVanWinkle w/ sinus symptoms and was given Cefdinir x10d but he reports only min better; also on the Pred from DrDuda; still c/o sinus congestion, pressure, light yellow mucus draining, still using Mucinex, Claritin, Flonase & now reporting chest tightness & some wheezing; he states "I can't clear my sinuses", denies f/c/s but has drainage/ blowing/ coughing...    EXAM shows Afeb, VSS, O2sat=97%;  Wt=215#;  Heent- +cerumen impactions, +sinus congestion, mallampati2;  Chest- clear w/o w/r/r;  Heart- RR w/o m/r/g;  Abd- soft, neg, nontender;  Ext- neg w/o c/c/e;  Neuro- intact..  CXR 05/27/17 (independently reviewed by me in the PACS system)> norm heart size, sl pleural thickening, sl incr markings- NAD...  LABS 05/27/17 showed Chems- wnl x BS=171, CBC- Hg=13.1, WBC=11K;  Sed=16...  Sputum C&S 05/27/17> NEG- NTF only IMP/PLAN>>  DMarden Noblehas had a long hx recurrent sinusitis & allergies [facility]; now off shots & having more  difficulty; he has seen DrVanWinkle in follow up;  We discussed Rx w/ Levaquin '750mg'$ /d x10d + Align & Activia;  Given Depo120 & continue Pred taper from DrDuda; continue Mucinex, Flonase, Claritin, [place] and f/u w/ ENT DrShoemaker for cerumen/ hearing aides/ sinusitis...  ADDENDUM>>  Sput from 05/27/17 was also set up for Fungal smear&culture> lab now reports smear pos for fungal elements, and cult shows Aspergillus flavus, Aspergillus fumigatus, Cryptococcus laurentii, & Bipolaris species;  He is on chr therapy for [facility]hen [facility]us [facility] MTX (3/wk), Prograf (oral swish), Mycelex (troches), & Folate;  He has had mult sinus infections treated periodically w/ Levaquin, Augmentin, etc;  He also gets freq courses for Pred [place] and from Ortho for back & right hip pain... I do not feel that these 4 agents are causing dis in his sinuses or lungs- suspect oral contam?  Plan is to try a course of MMW & repeat Sputum Fungal culture=> discussed w/ pt.  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    ~  October 18, 2017:  4-580moOV & after last OV pt saw ID-DrCampbell on 07/13/17>  2 recent sput cultures + for various fungi on cultures; on immunosuppressive rx for lichen planus; however there was no evid for active fungal infection- no pneumonia, sinusitis, or otitis; no further testing or treatment needed; asked to ret prn...    He had ORTHO f/u- DrDuda 06/03/17>  Prev right sided radicular pain & he reports resolved on his recent antibiotics + Pred for sinuses; asked to ret prn...    [facility]  Diabetic eye exam, s/p bilat cat surg w/ lens implants, no retinopathy  He saw DERM-[facility]Jorizzo [facility] 42/35/36>  Oral lichen planus on Mycelex troche one dissolved in mouth daily,  Diflucan100-one tab weekly, Prograf'1mg'$  cap dissolved in water & swish as directed, MTX2.1-4ERXV/QM, Folic'1mg'$ /d;  Improved since MTX added, tolerating meds, still some burning, & they saw oral candida (Rx Diflucan/ Mycelex)...       Pt called 10/08/17 c/o nasal drainage- yellow green + some diarrhea, fatigue, body aches; we called in Flagyl '250mg'$ Tid x7d; he has mult somatic complaints that he wants to relate to his time in [place]; wonders if he had food poisoning w/ recent diarrhea; under a lot of stress w/ tree damage on his property- offered Ativan rx '1mg'$ - 1/2 to 1 tab Bid...  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    AR> prev on allergy shots per DrESL, OTC antihist, Saline, Flonase; gets occas bouts of sinusitis but overall improved but he's concerned not doing as well off the shots- asked to check in w/ DrVanWinkle.    OSA> Sleep Study 2/04 w/ RDI=45 & desat to 88% w/ mod snoring & leg jerks, but intol to CPAP & never used; states he sleeps fine now if he stays on his side; denies daytime sleepiness or other issues, naps 1/7...    COPD> exsmoker quit 1981; on AdvairHFA115-2spBid w/ spacer, Mucinex prn; he doesn't like Pred Rx; prev Bronchitic exac treated w/ antibiotics (Levaquin/ Augmentin), Medrol, Mucinex & improved; needs incr exercise program...    Medical Issues>  HBP (controlled), CAD (DrHochrein last 4/17), HL (on Niaspan, FLP ok 2/18), DM (on Metform, Labs 2/19 w/ BS=140 A1c=7.2, adding Glimep2) , Gastroparesis, BPH, DJD- Rhip & back pain, anxiety... EXAM shows Afeb, VSS, O2sat=99%;  Wt=215#, 6'2"Tall, BMI=28;  Heent- neg, mallampati2;  Chest- clear w/o w/r/r;  Heart- RR w/o m/r/g;  Abd- soft, neg, nontender;  Ext- neg w/o c/c/e;  Neuro- intact...  LABS 07/2017 [facility]   Chems- ok w/ BS=156, Cr=0.84, LFTs wnl;  CBC- ok w/ Hg=12.9, wbc=6.2.Marland KitchenMarland Kitchen  LABS 10/18/17>  FLP- at goals on Niacin500;  Chems- ok x BS=140, A1c=7.2 on Metform500Bid- ADD GLIMEP'2mg'$ /d;  CBC- ok w/ Hg=12.8, wbc=6.4;  TSH=0.76;  PSA=2.18... IMP/PLAN>>  FLP is ok on Niaspan500, continue same;  DM control not as good w/ BS=140 & A1c=7.2 on Metform500bid, rec adding glimep'2mg'$ Qam; he will maintain f/u w/ DERM-DrJorizzo at [facility]; otherw continue same meds and try to avoid infections, may need to get back on allergy shots...          Problem List:  HEARING LOSS (ICD-389.9) - he has bilat hearing aides...  ALLERGIC RHINITIS (ICD-477.9) - on allergy shots from DrESL Gaetano Hawthorne)... plus Claritin, Saline, Mucinex, Flonase. ~  occas bouts of sinusitis requiring antibiotic Rx... ~  8/13: he had allergy f/u DrVanWinkle> skin testing 2010 pos to dust mites and molds; doing well on shots once/mo; stable & no changes made... ~  3/14: he presented w/ an upper resp infection & treated w/ Depo, Dosepak, Zithromax... ~  8/14:  He had allergy f/u DrVanWinkle> Advair, Flonase, Saline, Mucinex; he checked FENO- reported normal... ~  7/15: stable on allergy meds from DrVanWinkle, plus AdvairHFA115; he had sinus/ AB exac 5/15 treated by TP w/ ZPak=> Levaquin & resolved...  OBSTRUCTIVE SLEEP APNEA (ICD-327.23) - sleep study 8/04 showed RDI= 45 & desat to 88%... mod  snoring and leg jerks without sleep disruption... CPAP perscribed but not using it now- states he can't sleep w/ it on & furthermore he is resting well as long as he stays on his side... denies daytime hypersomnolence & not interested in re-evaluation.  COPD (ICD-496) - ex-smoker, quit 1981 >>  ~  on ADVAIR 100Bid, PROAIR Prn, MUCINEX Bid... he had Pneumovax in 2008... doesn't like Pred Rx...  ~  10/11:  doing well except recent cough, min green sputum (no change in dyspnea)- we will Rx w/ Augmentin, incr Mucinex/ Fluids. ~  4/12:  Breathing at baseline, continue  maintenance meds... ~  CXR 12/13 showed normal heart size, clear lungs, NAD... ~  1/14:  He's had 2 exac this past yr- saw TP w/ Rx & improved... ~  1/15:  He continues to do well on Advair + allergy Rx from DrVanWinkle w/ Flonase, Saline, Mucinex... ~  7/15: exsmoker quit 1981; on AdvairHFA115-2spBid, Mucinex prn; he doesn't like Pred Rx; he had Bronchitic exac treated by TP 5/15 w/ ZPak, then Levaquin, Mucinex, etc & improved; needs incr exercise program... ~  10/15: seen w/ refractory bronchitic episode reqiring ZPak=>Levaquin, Medrol, Mucinex, Hydromet, etc... ~  1/16: He had a URI/ AB exac treated 10/15 w/ Levaquin/ Medrol/ Mucinex & resolved; now at baseline on Advair115-2spBid etc... ~  11/2016> he developed a LLL pneumonia (nos) & resolved w/ Rocephin/ Zithromax, Medrol, Nebs, etc...  HYPERTENSION (ICD-401.9) - prev on diet alone> then on Diovan320, but we decided to change to LOSARTAN '100mg'$ /d 5/13 to save $$ ~  He claims that Norvasc & HCTZ "stopped my urine flow"... ~  10/11:  BP= 128/80 & even better at home he says... denies HA, fatigue, visual changes, CP, palipit, dizziness, syncope, dyspnea, edema, etc... ~  4/12:  Pt has seen TP, DrHochrein, & checking BP daily at home; tried on Norvasc but claims it decr his urine flow; now on Diovan & improved w/ BP= 132/70... ~  5/12:  BP remains well controlled on Diovan '320mg'$ /d= 120/62 today. ~  11/12:  BP= 142/82 on Diovan160 now; denies CP, palpit, dizzy, SOB, edema... ~  5/13:  BP= 128/66 & he's requesting change to cheaper med; rec switch Diovan to LOSARTAN '100mg'$ /d... ~  1/14: on Losar100; BP=140/84, he notes incr HR he says & we decided to add low dose ATENOLOL'25mg'$ /d... ~  7/14: on Aten25, Losar100> BP=128/72 & he denies CP, palpit, SOB, edema, etc... ~  1/15: BP controlled on Aten25, Losar100 w/ BP= 130/80 & he denies CP, palpit, SOB, edema. ~  7/15: on Aten25, Losar100; BP=124/70, he denies CP, palpit, dizzy, SOB, edema, etc. ~  10/15:  on Aten25, Losar100; BP= 140/60 & remains stable hemodynamically... ~  BP remains under good control on same meds...  R/O CAD (ICD-414.00) - on ASA '81mg'$ /d... Followed by DrHochrein & his notes are reviewed. ~  hx neg cardiac cath 1988 by DrJoe Riley... ~  NuclearStressTest 10/02 was norm- no scar or ischemia, EF=55%. ~  CT Abd 4/10 via ER showed coronary calcif... referred to Cards. ~  Eval by DrHochrein 6/10 showed norm EKG & cardiac exam... treadmill ordered. ~  Treadmill 6/10 showed reasonable exerc tolerance, no ischemic changes, few PVCs, sl incr BP... ~  EKG 3/13 showed NSR, rate76, WNL, NAD... ~  Myoview 3/13 was neg> exercised for 80mn, stopped for fatigue & CP; no ST seg changes, occas PACs & PVCs; hypertensive BP response; no ischemia, EF=71%, normal wall motion... ~  He saw DrHochrein 4/14>  hx CAD, doing satis on ASA & BP rx w/o angina & rec to follow program of aggressive risk factor reduction, no change in meds.  ~  EKG 4/14 showed NSR, rate60, wnl, NAD.. ~  6/15: .on ASA81; followed by DrHochrein- last seen 4/15- no new symptoms, active w/ yard work, EKG showed wnl, they decided to do Treadmill (borderline ST depression), then Myoview 6/15 showed NEG- no ischemia, decr uptake inferiorly same as 2013, EF=69%, norm wall motion, low risk...  MIXED HYPERLIPIDEMIA (ICD-272.2) - low HDL and NIASPAN '500mg'$ /d started 4/09... ~  [place] 4/09 showed TChol 160, TG 106, HDL 26, LDL 113... rec- diet + Niaspan 500/d. ~  FLP 10/09 showed TChol 155, TG 78, HDL 33, LDL 106... ~  FLP 4/10 showed TChol 133, TG 63, HDL 30, LDL 91... rec> continue same. ~  FLP 4/11 showed TChol 144, TG 61, HDL 38, LDL 94 ~  FLP 10/11 showed TChol 155, TG 81, HDL 40, LDL 99 ~  FLP 4/12 showed TChol 142, TG 69, HDL 38, LDL 90 ~  FLP 11/12 showed TChol 165, TG 82, HDL 43, LDL 106 ~  FLP 5/13 in Niasp500 showed TChol 155, TG 72, HDL 46, LDL 95... rec change to generic. ~  FLP 1/14 on Niacin500 showed TChol 140, TG 56, HDL  39, LDL 90 ~  FLP 1/15 on Niaspan500 showed TChol 148, TG 57, HDL 43, LDL 94  ~  FLP 1/16 on Niaspan500 showed TChol 150, TG 101, HDL 45, LDL 85 ~  FLP 1/17 on Niaspan500 showed TChol 132, TG 52, HDL 46, LDL 75  DM (ICD-250.00) - on diet + METFORMIN '500mg'$ Bid... he reports BS at home all in the 120-130's... ~  labs 4/09 showed BS= 132, HgA1c= 6.6.Marland KitchenMarland Kitchen rec- same med, better diet... ~  labs 10/09 showed BS= 131, HgA1c= 6.3.Marland KitchenMarland Kitchen ~  labs 4/10 showed BS= 117, HgA1c= 6.2.[name]. rec> same meds/ diet Rx. ~  labs 10/10 showed BS= 138, A1c= 6.5 ~  labs 4/11 showed BS= 121, A1c= 6.5.Marland KitchenMarland Kitchen On Metform500Bid + diet. ~  Dilated eye exam 5/11 by DrShapiro- no retinopathy... ~  labs 10/11 (wt=228#) showed BS= 118, A1c= 7.1.[name]. not as good- get on diet or more meds. ~  Labs 4/12 (wt=229#) showed BS= 119, A1c= 7.3... Wrong direction, may need more meds, get wt down! ~  5/12:  Ophthalmology check by DrShapiro was neg- no retinopathy... ~  Labs 11/12 showed BS= 136, A1c= 7.0 ~  Labs 5/13 on MetformBid showed BS= 132, A1c= 7.1.Marland KitchenMarland Kitchen Continue same, get wt down. ~  Ophthalmology check from DrShapiro 5/13> no DM retinopathy... ~  Labs 1/14 showed BS= 134, A1c= 7.5; not as good- needs better diet, get wt down, same meds for now... ~  Labs 7/14 on Metform500Bid showed BS= 140, A1c= 7.3  ~  Labs 1/154 on Metform500Bid showed BS= 129 & A1c= 7.0, and we reviewed meds/ diet/ exercise/ weight... ~  Labs 7/15 on Metform500Bid showed BS= 126, A1c= 7.1 & reminded of need for diet/ exerc/ wt reduction... ~  Labs 1/16 on Metform500Bid showed BS= 133, A1c= 7.2 ~  Labs 1/17 on Metform500Bid showed BS= 134, A1c= 6.8  GERD (ICD-530.81) & ? GASTROPARESIS - on PROTONIX '40mg'$ /d, & off Reglan per DrPerry... ~   last EGD 6/07 by DrPerry showed gastic polyp... ~  8/10:  given Compazine suppos for gastroparesis flair by DrGessner. ~  4/12:  C/o intermittent dysphagia & referred back to GI for further eval (colonoscopy due as well).Marland KitchenMarland Kitchen  DIVERTICULOSIS  OF COLON (ICD-562.10) - he uses MIRALAX Bid regularly... IRRITABLE BOWEL SYNDROME (ICD-564.1) COLONIC POLYPS (ICD-211.3) ~  last colonoscopy 5/07 by DrSam showed divertics only... f/u 70yr. ~  CT Abd 4/10 in ER showed atx bases, coronary calcif, s/p GB, abdAo calcif, 170mrenal stone, divertics, bilat hip AVN. ~  Follow up colonoscopy by DrPerry 5/12 showed severe diverticulosis, 2 sm polyps= tubular adenoma & f/u planned 5 yrs...  RENAL CALCULUS, HX OF (ICD-V13.01) BENIGN PROSTATIC HYPERTROPHY, HX OF (ICD-V13.8) - on FLOMAX 0.'4mg'$ /d w/ improved symptoms (he states flow better since stopping Vit E supplement). ~  4/12:  Routine PSA= 4.9 (it was 1.02 4/11) & we rx w/ Doxy Bid x14d, thewn plan recheck PSA==> 1.30  DEGENERATIVE JOINT DISEASE (ICD-715.90) - pt states "I have a bum knee" and eval by DrDuda w/ viscosupplementation shots... Note: Abd CT w/ bilat hip AVN noted... takes TYLENOL Prn... ~  11/10:  s/p left knee arthroscopy by DrDuda... ~  10/11:  s/p fall w/ left knee injury- s/p cortisone shot by DrDuda; hx shot in left shoulder too. ~  7/14: he is c/o incr pain in left hip area; known hx AVN & needs f/u by Ortho...  BACK PAIN, LUMBAR (ICD-724.2)  ANXIETY (ICD-300.00) - not currently on meds for nerves.  ANEMIA (ICD-285.9) - prev hx of GIB... ~  labs 4/09 showed Hg= 14.1 ~  labs 4/10 showed Hg= 13.6 ~  labs 4/11 showed Hg= 13.4 ~  Labs 4/12 showed Hg= 13.8 ~  Labs 3/13 showed Hg= 13.1 ~  Labs 1/14 showed Hg= 13.6 ~  Labs 1/15 showed Hg= 13.7 ~  Labs 1/16 showed Hg= 14.1 ~  CBC remains WNL...  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  Health Maintenance: ~  GI: followed by DrPerry> Colonoscopy 5/07 by DrSam... f/u planned 5 yrs. ~  GU:  See PSAs recorded above... ~  Immunizations: Pneumovax in 2008 @ age 89 PRParmeleiven 7/15; Tetanus- given 4/10;  he gets yearly seasonal Flu vaccine.   Past Surgical History:  Procedure Laterality Date  . CATARACT  EXTRACTION    . CHOLECYSTECTOMY    . KNEE SURGERY  11/10  . SKIN CANCER EXCISION  3/10   left hand     Outpatient Encounter Medications as of 10/18/2017  Medication Sig  . acetaminophen (TYLENOL) 500 MG tablet Take 500 mg by mouth every 6 (six) hours as needed for headache (pain).   . Marland Kitchenspirin EC 81 MG tablet Take 81 mg by mouth at bedtime.  . Marland Kitchentenolol (TENORMIN) 25 MG tablet Take 1 tablet (25 mg total) by mouth daily.  . Cholecalciferol (VITAMIN D) 2000 UNITS CAPS Take 2,000 Units by mouth daily.   . clotrimazole (MYCELEX) 10 MG troche Take 10 mg by mouth daily.   . COMPRO 25 MG suppository PLACE 1 SUPPOSITORY (25 MG TOTAL) RECTALLY EVERY 12 (TWELVE) HOURS AS NEEDED FOR NAUSEA.  . Marland Kitchenextromethorphan-guaiFENesin (MUCINEX DM) 30-600 MG per 12 hr tablet Take 1 tablet by mouth every 12 (twelve) hours.  . fluconazole (DIFLUCAN) 100 MG tablet Take 100 mg by mouth daily. One a day for 5 days and 1 a week for 5 weeks.  . fluticasone (FLONASE) 50 MCG/ACT nasal spray Place 2 sprays into both nostrils daily.  . fluticasone-salmeterol (ADVAIR HFA) 115-21 MCG/ACT inhaler Inhale 1 puff into the lungs 2 (two) times daily.  . folic acid (FOLVITE) 1 MG tablet Take 1 mg by mouth daily with lunch.   .[name]  hyoscyamine (LEVSIN SL) 0.125 MG SL tablet Place 1 tablet (0.125 mg total) under the tongue every 4 (four) hours as needed. As needed for abdominal cramping  . lidocaine (ASPERCREME W/LIDOCAINE) 4 % cream Apply 1 application topically daily as needed (pain).   [name] loratadine (CLARITIN) 10 MG tablet Take 10 mg by mouth daily as needed for allergies.  [name] losartan (COZAAR) 50 MG tablet Take 1 tablet (50 mg total) by mouth daily.  . metFORMIN (GLUCOPHAGE) 500 MG tablet Take 1 tablet (500 mg total) by mouth 2 (two) times daily with a meal.  . methotrexate (RHEUMATREX) 2.5 MG tablet Take 3 tablets (7.5 mg total) by mouth See admin instructions. Take 3 tablets (7.5 mg) by mouth on Tuesdays with lunch, starting 11/24/2016  .  metroNIDAZOLE (FLAGYL) 250 MG tablet Take 1 tablet (250 mg total) by mouth 3 (three) times daily.  . Multiple Vitamins-Minerals (MULTIVITAMIN WITH MINERALS) tablet Take 1 tablet by mouth daily.    . niacin (NIASPAN) 500 MG CR tablet Take 1 tablet (500 mg total) by mouth at bedtime.  . nitroGLYCERIN (NITROSTAT) 0.4 MG SL tablet Place 1 tablet (0.4 mg total) under the tongue every 5 (five) minutes as needed for chest pain.  . ONE TOUCH ULTRA TEST test strip TEST BLOOD SUGAR ONCE A DAY AS DIRECTED (DX E11.9)  . pantoprazole (PROTONIX) 40 MG tablet Take 1 tablet (40 mg total) by mouth daily.  . polyethylene glycol powder (GLYCOLAX/MIRALAX) powder TAKE 17 GRAMS BY MOUTH TWICE DAILY AS NEEDED  . predniSONE (DELTASONE) 10 MG tablet TAKE TWO TABLETS BY MOUTH EVERY MORNING. WEAN OFF AS SYMPTOMS RESOLVE  . Probiotic Product (PROBIOTIC PO) Take 1 tablet by mouth daily.  . sodium chloride (OCEAN) 0.65 % nasal spray Place 1 spray into the nose 3 (three) times daily as needed for congestion.   . tacrolimus (PROGRAF) 1 MG capsule Take 1 mg by mouth See admin instructions. Dissolve 1 capsule (1 mg) in 500 ml of water and keep in refrigerator - swish and spit small amount twice daily  . tamsulosin (FLOMAX) 0.4 MG CAPS capsule Take 1 capsule (0.4 mg total) by mouth 2 (two) times daily.  . traMADol (ULTRAM) 50 MG tablet TAKE ONE TABLET BY MOUTH THREE TIMES A DAY AS NEEDED FOR PAIN  . [DISCONTINUED] hyoscyamine (LEVSIN SL) 0.125 MG SL tablet Place 1 tablet (0.125 mg total) under the tongue every 4 (four) hours as needed. As needed for abdominal cramping (Patient taking differently: Place 0.125 mg under the tongue every 4 (four) hours as needed (abdominal cramping). )  . [DISCONTINUED] polyethylene glycol powder (GLYCOLAX/MIRALAX) powder TAKE 17 GRAMS BY MOUTH TWICE DAILY AS NEEDED  . [DISCONTINUED] amoxicillin-clavulanate (AUGMENTIN) 875-125 MG tablet Take 1 tablet by mouth 2 (two) times daily. (Patient not taking:  Reported on 10/18/2017)   No facility-administered encounter medications on file as of 10/18/2017.     Allergies  Allergen Reactions  . Prednisone Other (See Comments)    REACTION: high dose intolerance - causes numbness from waist down - low dose tapered course ok    Immunization History  Administered Date(s) Administered  . H1N1 08/21/2008  . Influenza Split 06/01/2011, 05/26/2012  . Influenza Whole 06/06/2008, 06/21/2009, 06/06/2010  . Influenza, High Dose Seasonal PF 07/03/2016, 07/02/2017  . Influenza,inj,Quad PF,6+ Mos 06/07/2013, 07/09/2014, 06/10/2015  . Pneumococcal Conjugate-13 04/05/2014  . Pneumococcal Polysaccharide-23 06/30/2006  . Zoster 09/14/2010    Current Medications, Allergies, Past Medical History, Past Surgical History, Family History, and Social History were  reviewed in [place] record.    Review of Systems         See HPI - all other systems neg except as noted... The patient complains of decreased hearing, dyspnea on exertion, headaches, and difficulty walking.  The patient denies anorexia, fever, weight loss, weight gain, vision loss, hoarseness, chest pain, syncope, peripheral edema, prolonged cough, hemoptysis, abdominal pain, melena, hematochezia, severe indigestion/heartburn, hematuria, incontinence, muscle weakness, suspicious skin lesions, transient blindness, depression, unusual weight change, abnormal bleeding, enlarged lymph nodes, and angioedema.     Objective:   Physical Exam      WD, WN, 82 y/o WM in NAD... GENERAL:  Alert & oriented; pleasant & cooperative... HEENT:  [place]/AT, EOM-full, PERRLA, EACs-clear, TMs-wnl, NOSE- pale, congested, THROAT-clear, but lichenoid lesion on lat tongue. NECK:  Supple w/ fairROM; no JVD; normal carotid impulses w/o bruits; no thyromegaly or nodules palpated; no lymphadenopathy. CHEST:  Clear to P & A w/o wheezing, rhonchi, rales, or signs of consolidation... HEART:  Regular Rhythm;  without murmurs/ rubs/ or gallops detected...  ABDOMEN:  Soft & nontender; normal bowel sounds; no organomegaly or masses palpated., no guarding, or rebound EXT: without deformities, mild arthritic changes; no varicose veins/ venous insuffic/ or edema. NEURO:  CN's intact;  no focal neuro deficits... DERM:  No lesions noted; no rash etc...  RADIOLOGY DATA:  Reviewed in the EPIC EMR & discussed w/ the patient...  LABORATORY DATA:  Reviewed in the EPIC EMR & discussed w/ the patient...   Assessment & Plan:    Lesion on tongue>. LICHEN PLANUS and he is improved on Rx from [place] (MTX, Mycelex, & Prograf=> dissolve in water/ swish/ & spit)...   Hx Acute Bronchitis> presented 10/15 w/ refractory bronchitic episode; given ZPak=>Levaquin, Depo80/ Medrol dosepak, Mucinex, Hydromet, etc=> resolved... COPD exac>  He continues on Advair, Proair rescue, Mucinex; also Claritin, Flonase, Saline; stable continue same Rx... LLL Pneumonia 11/2016>  Hosp by [facility] & treated w/ Rocephin/Zithromax, Medrol taper, NEBS, etc & improved => resolved... 05/27/17>   Jesse Santos has had a long hx recurrent sinusitis & allergies followed by DrESL at [facility]; now off shots & having more difficulty; he has seen DrVanWinkle in follow up;  We discussed Rx w/ Levaquin '750mg'$ /d x10d + Align & Activia;  Given Depo120 & continue Pred taper from DrDuda; continue Mucinex, Flonase, Claritin, [place] and f/u w/ ENT DrShoemaker for cerumen/ hearing aides/ sinusitis... 10/18/17>   FLP is ok on Niaspan500, continue same;  DM control not as good w/ BS=140 & A1c=7.2 on Metform500bid, rec adding glimep'2mg'$ Qam; he will maintain f/u w/ DERM[facility]Jorizzo at [facility]; otherw continue same meds and try to avoid infections, may need to get back on allergy shots.   HBP>  On LOSARTAN-'50mg'$ /d & ATEN-25 w/ good control of BP, continue same...  R/O CAD>  Followed by DrHochrein, and doing satis w/ neg Myoview 6/15... continue ASA &  risk factor reduction....  Lipids>  Looks satis on his diet + Niacin...  DM>  Control is fair on Metform500Bid w/ A1c stable up to 7.2;  We decided to add Glimep'2mg'$  Qam...  GI>  Per DrPerry w/ GERD, ?Gastroparesis, Divertics, IBS, Colon polyps> f/u colon 5/12 w/ divertics & 2 sm adenomas, repeat planned ~26yr...  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  Anxiety>  Certainly an issue, he does not want anxiolytic Rx...   Patient's Medications  New Prescriptions  No medications on file  Previous Medications   ACETAMINOPHEN (TYLENOL) 500 MG TABLET    Take 500 mg by mouth every 6 (six) hours as needed for headache (pain).    ASPIRIN EC 81 MG TABLET    Take 81 mg by mouth at bedtime.   ATENOLOL (TENORMIN) 25 MG TABLET    Take 1 tablet (25 mg total) by mouth daily.   CHOLECALCIFEROL (VITAMIN D) 2000 UNITS CAPS    Take 2,000 Units by mouth daily.    CLOTRIMAZOLE (MYCELEX) 10 MG TROCHE    Take 10 mg by mouth daily.    COMPRO 25 MG SUPPOSITORY    PLACE 1 SUPPOSITORY (25 MG TOTAL) RECTALLY EVERY 12 (TWELVE) HOURS AS NEEDED FOR NAUSEA.   DEXTROMETHORPHAN-GUAIFENESIN (MUCINEX DM) 30-600 MG PER 12 HR TABLET    Take 1 tablet by mouth every 12 (twelve) hours.   FLUCONAZOLE (DIFLUCAN) 100 MG TABLET    Take 100 mg by mouth daily. One a day for 5 days and 1 a week for 5 weeks.   FLUTICASONE (FLONASE) 50 MCG/ACT NASAL SPRAY    Place 2 sprays into both nostrils daily.   FLUTICASONE-SALMETEROL (ADVAIR HFA) 115-21 MCG/ACT INHALER    Inhale 1 puff into the lungs 2 (two) times daily.   FOLIC ACID (FOLVITE) 1 MG TABLET    Take 1 mg by mouth daily with lunch.    LIDOCAINE (ASPERCREME W/LIDOCAINE) 4 % CREAM    Apply 1 application topically daily as needed (pain).    LORATADINE (CLARITIN) 10 MG TABLET    Take 10 mg by mouth daily as needed for allergies.   LOSARTAN (COZAAR) 50 MG TABLET    Take 1 tablet (50 mg total) by mouth daily.   METFORMIN (GLUCOPHAGE) 500 MG  TABLET    Take 1 tablet (500 mg total) by mouth 2 (two) times daily with a meal.   METHOTREXATE (RHEUMATREX) 2.5 MG TABLET    Take 3 tablets (7.5 mg total) by mouth See admin instructions. Take 3 tablets (7.5 mg) by mouth on Tuesdays with lunch, starting 11/24/2016   METRONIDAZOLE (FLAGYL) 250 MG TABLET    Take 1 tablet (250 mg total) by mouth 3 (three) times daily.   MULTIPLE VITAMINS-MINERALS (MULTIVITAMIN WITH MINERALS) TABLET    Take 1 tablet by mouth daily.     NIACIN (NIASPAN) 500 MG CR TABLET    Take 1 tablet (500 mg total) by mouth at bedtime.   NITROGLYCERIN (NITROSTAT) 0.4 MG SL TABLET    Place 1 tablet (0.4 mg total) under the tongue every 5 (five) minutes as needed for chest pain.   ONE TOUCH ULTRA TEST TEST STRIP    TEST BLOOD SUGAR ONCE A DAY AS DIRECTED (DX E11.9)   PANTOPRAZOLE (PROTONIX) 40 MG TABLET    Take 1 tablet (40 mg total) by mouth daily.   PREDNISONE (DELTASONE) 10 MG TABLET    TAKE TWO TABLETS BY MOUTH EVERY MORNING. WEAN OFF AS SYMPTOMS RESOLVE   PROBIOTIC PRODUCT (PROBIOTIC PO)    Take 1 tablet by mouth daily.   SODIUM CHLORIDE (OCEAN) 0.65 % NASAL SPRAY    Place 1 spray into the nose 3 (three) times daily as needed for congestion.    TACROLIMUS (PROGRAF) 1 MG CAPSULE    Take 1 mg by mouth See admin instructions. Dissolve 1 capsule (1 mg) in 500 ml of water and keep in refrigerator - swish and spit small amount twice daily   TAMSULOSIN (FLOMAX) 0.4 MG CAPS CAPSULE  Take 1 capsule (0.4 mg total) by mouth 2 (two) times daily.   TRAMADOL (ULTRAM) 50 MG TABLET    TAKE ONE TABLET BY MOUTH THREE TIMES A DAY AS NEEDED FOR PAIN  Modified Medications   Modified Medication Previous Medication   HYOSCYAMINE (LEVSIN SL) 0.125 MG SL TABLET hyoscyamine (LEVSIN SL) 0.125 MG SL tablet      Place 1 tablet (0.125 mg total) under the tongue every 4 (four) hours as needed. As needed for abdominal cramping    Place 1 tablet (0.125 mg total) under the tongue every 4 (four) hours as needed.  As needed for abdominal cramping   POLYETHYLENE GLYCOL POWDER (GLYCOLAX/MIRALAX) POWDER polyethylene glycol powder (GLYCOLAX/MIRALAX) powder      TAKE 17 GRAMS BY MOUTH TWICE DAILY AS NEEDED    TAKE 17 GRAMS BY MOUTH TWICE DAILY AS NEEDED  Discontinued Medications   AMOXICILLIN-CLAVULANATE (AUGMENTIN) 875-125 MG TABLET    Take 1 tablet by mouth 2 (two) times daily.

## 2017-10-18 NOTE — Patient Instructions (Signed)
Today we updated your med list in our EPIC system...    Continue your current medications the same...  Today we checked your follow up fasting bl;ood work...    We will contact you w/ the results when available...   Work on [employer] reduction 7 increasing your exercise program...  Call for any questions...  Let's plan a follow up visit in 51mo, sooner if needed for problems.Marland KitchenMarland Kitchen

## 2017-10-19 MED ORDER — GLIMEPIRIDE 2 MG PO TABS: 2.0000 mg | ORAL_TABLET | Freq: Every day | ORAL | 3 refills | Status: DC

## 2017-11-09 DIAGNOSIS — Z79899 Other long term (current) drug therapy: Secondary | ICD-10-CM | POA: Diagnosis not present

## 2017-11-09 DIAGNOSIS — Z5181 Encounter for therapeutic drug level monitoring: Secondary | ICD-10-CM | POA: Diagnosis not present

## 2017-11-09 DIAGNOSIS — L438 Other lichen planus: Secondary | ICD-10-CM | POA: Diagnosis not present

## 2017-11-09 DIAGNOSIS — B370 Candidal stomatitis: Secondary | ICD-10-CM | POA: Diagnosis not present

## 2017-11-18 NOTE — Progress Notes (Signed)
HPI The patient presents for  followup of coronary calcification and he did have chest pain and stress testing in 2013. There was no evidence of ischemia.  He had repeat Lexiscan Myoview last in June of 2015 which was negative for ischemia.    He returns for follow up.  Since I last saw him he is done relatively well.  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  But is not been having any cardiovascular complaints.  He pushes him over at times and he is cut down 6 trees and has 9 to go.  With this he denies any chest pressure, neck or arm discomfort.  He is had some fleeting right upper chest discomfort that happens sporadically and rarely.  Is not had any substernal pain however.  Is not had any palpitations, presyncope or syncope.  He has no PND or orthopnea.  He has no new shortness of breath.  He had a pneumonia and was managed for this.   Allergies  Allergen Reactions  . Prednisone Other (See Comments)    REACTION: high dose intolerance - causes numbness from waist down - low dose tapered course ok    Current Outpatient Medications  Medication Sig Dispense Refill  . acetaminophen (TYLENOL) 500 MG tablet Take 500 mg by mouth every 6 (six) hours as needed for headache (pain).     [name] aspirin EC 81 MG tablet Take 81 mg by mouth at bedtime.    [name] atenolol (TENORMIN) 25 MG tablet Take 1 tablet (25 mg total) by mouth daily. 90 tablet 3  . clotrimazole (MYCELEX) 10 MG troche Take 10 mg by mouth daily.     [name] dextromethorphan-guaiFENesin (MUCINEX DM) 30-600 MG per 12 hr tablet Take 1 tablet by mouth every 12 (twelve) hours.    . fluconazole (DIFLUCAN) 100 MG tablet Take 100 mg by mouth daily. One a day for 5 days and 1 a week for 5 weeks.    . fluticasone (FLONASE) 50 MCG/ACT nasal spray Place 2 sprays into both nostrils daily. 16 g 11  . fluticasone-salmeterol (ADVAIR HFA) 115-21 MCG/ACT inhaler Inhale 1 puff into the lungs 2 (two) times daily. 1 Inhaler 11  . folic acid (FOLVITE) 1 MG  tablet Take 1 mg by mouth daily with lunch.     . glimepiride (AMARYL) 2 MG tablet Take 1 tablet (2 mg total) by mouth daily with breakfast. 90 tablet 3  . loratadine (CLARITIN) 10 MG tablet Take 10 mg by mouth daily as needed for allergies.     [name] losartan (COZAAR) 50 MG tablet Take 1 tablet (50 mg total) by mouth daily. 90 tablet 3  . metFORMIN (GLUCOPHAGE) 500 MG tablet Take 1 tablet (500 mg total) by mouth 2 (two) times daily with a meal. 180 tablet 3  . methotrexate (RHEUMATREX) 2.5 MG tablet Take 3 tablets (7.5 mg total) by mouth See admin instructions. Take 3 tablets (7.5 mg) by mouth on Tuesdays with lunch, starting 11/24/2016 4 tablet 0  . metroNIDAZOLE (FLAGYL) 250 MG tablet Take 1 tablet (250 mg total) by mouth 3 (three) times daily. 21 tablet 0  . Multiple Vitamins-Minerals (MULTIVITAMIN WITH MINERALS) tablet Take 1 tablet by mouth daily.      . niacin (NIASPAN) 500 MG CR tablet Take 1 tablet (500 mg total) by mouth at bedtime. 90 tablet 3  . nitroGLYCERIN (NITROSTAT) 0.4 MG SL tablet Place 1 tablet (0.4 mg total) under the tongue every 5 (five) minutes as  needed for chest pain. (Patient taking differently: Place 0.4 mg under the tongue every 5 (five) minutes as needed for chest pain. ) 30 tablet 11  . ONE TOUCH ULTRA TEST test strip TEST BLOOD SUGAR ONCE A DAY AS DIRECTED (DX E11.9) 100 each 3  . pantoprazole (PROTONIX) 40 MG tablet Take 1 tablet (40 mg total) by mouth daily. 90 tablet 3  . polyethylene glycol powder (GLYCOLAX/MIRALAX) powder TAKE 17 GRAMS BY MOUTH TWICE DAILY AS NEEDED 1054 g 5  . Probiotic Product (PROBIOTIC PO) Take 1 tablet by mouth daily.    . sodium chloride (OCEAN) 0.65 % nasal spray Place 1 spray into the nose 3 (three) times daily as needed for congestion.      . tacrolimus (PROGRAF) 1 MG capsule Take 1 mg by mouth See admin instructions. Dissolve 1 capsule (1 mg) in 500 ml of water and keep in refrigerator - swish and spit small amount twice daily    .  tamsulosin (FLOMAX) 0.4 MG CAPS capsule Take 1 capsule (0.4 mg total) by mouth 2 (two) times daily. 180 capsule 3  . traMADol (ULTRAM) 50 MG tablet TAKE ONE TABLET BY MOUTH THREE TIMES A DAY AS NEEDED FOR PAIN 90 tablet 5  . Cholecalciferol (VITAMIN D) 2000 UNITS CAPS Take 2,000 Units by mouth daily.     . COMPRO 25 MG suppository PLACE 1 SUPPOSITORY (25 MG TOTAL) RECTALLY EVERY 12 (TWELVE) HOURS AS NEEDED FOR NAUSEA. 12 suppository 0  . hyoscyamine (LEVSIN SL) 0.125 MG SL tablet Place 1 tablet (0.125 mg total) under the tongue every 4 (four) hours as needed. As needed for abdominal cramping 30 tablet 5  . lidocaine (ASPERCREME W/LIDOCAINE) 4 % cream Apply 1 application topically daily as needed (pain).     . predniSONE (DELTASONE) 10 MG tablet TAKE TWO TABLETS BY MOUTH EVERY MORNING. WEAN OFF AS SYMPTOMS RESOLVE 60 tablet 0   No current facility-administered medications for this visit.     Past Medical History:  Diagnosis Date  . Allergic rhinitis, cause unspecified   . Anemia   . Anxiety   . Benign neoplasm of colon   . BPH (benign prostatic hypertrophy)   . COPD (chronic obstructive pulmonary disease) ([place])   . Coronary artery disease    Coronary calcification with negative ETT 2010;   ETT-Myoview (02/2014):  Normal stress nuclear study. No evidence of ischemia.  LV Ejection Fraction: 69%  . Degenerative joint disease   . Diverticulosis of colon (without mention of hemorrhage)   . Esophageal reflux   . Hearing loss   . Hyperplastic colon polyp 02/12/2011  . Irritable bowel syndrome   . Lumbar back pain   . Mixed hyperlipidemia   . Obstructive sleep apnea (adult) (pediatric)   . Oral lichen planus   . Renal calculus   . Type II or unspecified type diabetes mellitus without mention of complication, not stated as uncontrolled   . Unspecified essential hypertension     Past Surgical History:  Procedure Laterality Date  . CATARACT EXTRACTION    . CHOLECYSTECTOMY    . KNEE SURGERY   11/10  . SKIN CANCER EXCISION  3/10   left hand     ROS:   Positive for nocturia and sinus problems.  S stated in the HPI and negative for all other systems.  PHYSICAL EXAM BP 124/72   Pulse 70   Ht 6\' 1"  (1.854 m)   Wt 219 lb (99.3 kg)   BMI 28.89 kg/m  GENERAL:  Well appearing NECK:  No jugular venous distention, waveform within normal limits, carotid upstroke brisk and symmetric, no bruits, no thyromegaly LUNGS:  Clear to auscultation bilaterally CHEST:  Unremarkable HEART:  PMI not displaced or sustained,S1 and S2 within normal limits, no S3, no S4, no clicks, no rubs, no murmurs ABD:  Flat, positive bowel sounds normal in frequency in pitch, no bruits, no rebound, no guarding, no midline pulsatile mass, no hepatomegaly, no splenomegaly EXT:  2 plus pulses throughout, no edema, no cyanosis no clubbing   EKG:  NSR, rate 70, axis within normal limits, intervals within normal limits, no acute ST-T wave changes. PACs in a bigeminal pattern.  11/19/2017   Lab Results  Component Value Date   HGBA1C 7.2 (H) 10/18/2017   Lab Results  Component Value Date   CHOL 128 10/18/2017   TRIG 59.0 10/18/2017   HDL 49.40 10/18/2017   LDLCALC 67 10/18/2017    ASSESSMENT AND PLAN  CAD:  The patient has no new sypmtoms.  No further cardiovascular testing is indicated.  We will continue with aggressive risk reduction and meds as listed.   He is had no new symptoms since his last stress test in 2015.  HTN:   Blood pressure is at target.  Continue the meds as listed.  DYSLIPIDEMIA:   LDL is as above.  He will continue the meds as listed.  DM:  A1C was as above.  He had metformin added.  This will be followed by Noralee Space, MD  AORTIC ATHEROSCLEROSIS: This is noted on his CT and has been there previously.  He  was a smoker.  He will be screened with an abdominal ultrasound.

## 2017-11-19 ENCOUNTER — Encounter: Payer: Self-pay | Admitting: Cardiology

## 2017-11-19 ENCOUNTER — Ambulatory Visit (INDEPENDENT_AMBULATORY_CARE_PROVIDER_SITE_OTHER): Payer: Medicare Other | Admitting: Cardiology

## 2017-11-19 VITALS — BP 124/72 | HR 70 | Ht 73.0 in | Wt 219.0 lb

## 2017-11-19 DIAGNOSIS — I2510 Atherosclerotic heart disease of native coronary artery without angina pectoris: Secondary | ICD-10-CM | POA: Diagnosis not present

## 2017-11-19 DIAGNOSIS — Z720 Tobacco use: Secondary | ICD-10-CM | POA: Diagnosis not present

## 2017-11-19 DIAGNOSIS — R931 Abnormal findings on diagnostic imaging of heart and coronary circulation: Secondary | ICD-10-CM | POA: Diagnosis not present

## 2017-11-19 DIAGNOSIS — I700 Atherosclerosis of aorta: Secondary | ICD-10-CM | POA: Insufficient documentation

## 2017-11-19 NOTE — Patient Instructions (Signed)
Medication Instructions:  Continue current medications  If you need a refill on your cardiac medications before your next appointment, please call your pharmacy.  Labwork: None Ordered   Testing/Procedures: Your physician has requested that you have an abdominal aorta duplex. During this test, an ultrasound is used to evaluate the aorta. Allow 30 minutes for this exam. Do not eat after midnight the day before and avoid carbonated beverages   Follow-Up: Your physician wants you to follow-up in: 1 Year.You should receive a reminder letter in the mail two months in advance. If you do not receive a letter, please call our office [phone].      Thank you for choosing [facility]!!       

## 2017-11-25 ENCOUNTER — Ambulatory Visit (HOSPITAL_COMMUNITY)
Admission: RE | Admit: 2017-11-25 | Discharge: 2017-11-25 | Disposition: A | Discharge status: Home / Self Care | Payer: Medicare Other | Source: Ambulatory Visit | Attending: Cardiovascular Disease | Admitting: Cardiovascular Disease

## 2017-11-25 DIAGNOSIS — I708 Atherosclerosis of other arteries: Secondary | ICD-10-CM | POA: Insufficient documentation

## 2017-11-25 DIAGNOSIS — Z720 Tobacco use: Secondary | ICD-10-CM | POA: Diagnosis not present

## 2017-11-25 DIAGNOSIS — Z136 Encounter for screening for cardiovascular disorders: Secondary | ICD-10-CM | POA: Diagnosis not present

## 2017-12-09 ENCOUNTER — Ambulatory Visit (INDEPENDENT_AMBULATORY_CARE_PROVIDER_SITE_OTHER): Payer: Medicare Other | Admitting: Pulmonary Disease

## 2017-12-09 ENCOUNTER — Telehealth: Payer: Self-pay | Admitting: Pulmonary Disease

## 2017-12-09 ENCOUNTER — Ambulatory Visit (INDEPENDENT_AMBULATORY_CARE_PROVIDER_SITE_OTHER): Payer: Medicare Other

## 2017-12-09 ENCOUNTER — Encounter: Payer: Self-pay | Admitting: Pulmonary Disease

## 2017-12-09 VITALS — BP 132/82 | HR 87 | Temp 97.7°F | Ht 73.0 in | Wt 221.8 lb

## 2017-12-09 DIAGNOSIS — J449 Chronic obstructive pulmonary disease, unspecified: Secondary | ICD-10-CM

## 2017-12-09 DIAGNOSIS — E119 Type 2 diabetes mellitus without complications: Secondary | ICD-10-CM | POA: Diagnosis not present

## 2017-12-09 DIAGNOSIS — E782 Mixed hyperlipidemia: Secondary | ICD-10-CM

## 2017-12-09 DIAGNOSIS — I2510 Atherosclerotic heart disease of native coronary artery without angina pectoris: Secondary | ICD-10-CM | POA: Diagnosis not present

## 2017-12-09 DIAGNOSIS — F411 Generalized anxiety disorder: Secondary | ICD-10-CM | POA: Diagnosis not present

## 2017-12-09 DIAGNOSIS — Z8701 Personal history of pneumonia (recurrent): Secondary | ICD-10-CM

## 2017-12-09 DIAGNOSIS — L439 Lichen planus, unspecified: Secondary | ICD-10-CM | POA: Diagnosis not present

## 2017-12-09 DIAGNOSIS — I10 Essential (primary) hypertension: Secondary | ICD-10-CM | POA: Diagnosis not present

## 2017-12-09 DIAGNOSIS — R931 Abnormal findings on diagnostic imaging of heart and coronary circulation: Secondary | ICD-10-CM

## 2017-12-09 DIAGNOSIS — J329 Chronic sinusitis, unspecified: Secondary | ICD-10-CM

## 2017-12-09 DIAGNOSIS — K3184 Gastroparesis: Secondary | ICD-10-CM

## 2017-12-09 MED ORDER — LEVOFLOXACIN 500 MG PO TABS: 500.0000 mg | ORAL_TABLET | Freq: Every day | ORAL | 0 refills | Status: DC

## 2017-12-09 MED ORDER — METHYLPREDNISOLONE ACETATE 80 MG/ML IJ SUSP
80.0000 mg | Freq: Once | INTRAMUSCULAR | Status: AC
  Administered 2017-12-09: 80 mg via INTRAMUSCULAR

## 2017-12-09 MED ORDER — PREDNISONE 20 MG PO TABS: ORAL_TABLET | ORAL | 0 refills | Status: DC

## 2017-12-09 NOTE — Patient Instructions (Signed)
Today we updated your med list in our EPIC system...    Continue your current medications the same...  We decided to treat your recurrrent sinusitis w/ the following>>    Start the [facility] antibiotic - one tab daily til gone...    We gave you a Depo shot today for the inflammation...    Starting in the AM 3/29- take this PREDNISONE 20mg  tabs as follows:  Start w/ one tab twice daily for 4 days...  Then decrease to one tab each AM for 4 days...  Then decrease to 1/2 tab each AM for 4 days...  Then decrease to 1/2 tab every other day til gone...   Continue your Claritin/ Flonase/ Mucinex as you are doing...  Continue the OCEAN- nasal saline mist too...  We will arrange for an appt w/ DrShoemaker- ENT to see if there is anything they can do to help prevent these recurrent infections...  Call for any questions.Marland KitchenMarland Kitchen

## 2017-12-09 NOTE — Progress Notes (Signed)
Subjective:    Patient ID: Jesse Santos, male    DOB: 1935-09-29, 82 y.o.   MRN: [id number]  HPI 82 y/o WM w/ mult med problems here for a follow up visit>  ~  SEE PREV EPIC NOTES FOR OLDER DATA >>     LABS 1/14:  FLP- at goals on Niacin rx;  Chems- ok w/ BS=134, A1c=7.5;  CBC- wnl;  TSH=1.13;  VitD=42;  PSA=1.10      LABS 7/14:  Chems- ok x BS=140, A1c=7.3...   1/15> Jesse Santos reports that DrDuda plans a left THR (for avasc necreosis) whenever he is ready, but he's not rushing it, on Tramadol prn & doing satis for now...   LABS 1/15:  FLP- at goals on diet+Niaspan;  Chesm- ok w/ BS=129, A1c=7.0;  CBC- wnl;  TSH=1.06...  EKG 4/15 showed NSR, rate70, wnl, NAD...  Treadmill 6/15 (DrBrackbill) showed borderline ST segment changes w/ exercise & a run of atrial tachy reported...   Myoview 6/15 showed good exerc capacity, norm BP response, no CP, no evid ischemia, decr uptake inferiorly on rest images similar to 2013, EF=69%, norm wall motion...  LABS 7/15:  Chems- ok w/ BS=126, A1c=7.1.Jesse KitchenMarland Santos rec to continue Metform500Bid & get on diet, incr exerc, get wt down!!!   ~  October 10, 2014:  59moROV & Jesse Santos indicates it took several wks to get over his URI/AB episode in Oct;      On Advair115-2spBid, Mucinex, flonase, claritin; he reports breathing is stable- min cough, no sput, stable DOE, no edema...     He remains on Niasp500/d; FLP 1/16 showed TChol 150, TG 101, HDL 45, LDL 85    He continues Metform500Bid for his DM; Labs 1/16 showed BS=133, A1c=7.2    He has some lower abd pain/ cramping & has used the Levsin w/ relief- c/w IBS, some diarrhea, nausea, groin discomfort; he had colonoscopy 2012 w/ 2 polyps removed & f/u planned 2017...     He sees DrDuda for Ortho & takes Tramadol50 aS NEEDED... We reviewed prob list, meds, xrays and labs> see below for updates >>   CXR 1/16 showed norm heart size, clear lungs, DJD in Tspine, NAD...  LABS 1/16:  FLP- at goals on Niasp;  Chems- ok w/ BS=133  A1c=7.2;  CBC- wnl;  TSH=1.59;  PSA=1.25...   ~  April 10, 2015:  645moOV & Jesse Santos reports stable w/ CC= arthritis pain in shoulders/ hips, he sees DrDuda, told he needs left THR but he is holding off, Tramadol helps;  We reviewed the following medical problems during today's office visit >>    HOH> he has bilat hearing aides...    AR> on allergy shots, OTC antihist, Saline, Flonase; gets occas bouts of sinusitis but overall improved & no recent infections reported...     OSA> Sleep Study was in 2/04 w/ RDI=45 & desat to 88% w/ mod snoring & leg jerks, but he states he never could use the CPAP effectively & states he sleeps fine now if he stays on his side; denies daytime sleepiness or other issues...    COPD> exsmoker quit 1981; on AdvairHFA115-2spBid, Mucinex prn; he doesn't like Pred Rx; he had Bronchitic exac treated 10/15 w/ Levaquin, Medrol, Mucinex, etc & improved; needs incr exercise program...    HBP> on Aten25, Losar100; BP=120/70, he denies CP, palpit, dizzy, SOB, edema, etc...    CAD> on ASA81; followed by DrHochrein- CT Abd 2010 showed coronary calcif, Treadmill was neg; Myoview 3/13 was neg,  wnl; last seen 4/15- no new symptoms, active w/ yard work, EKG showed wnl, they decided to do Treadmill (borderline ST depression), then Myoview 6/15 showed NEG- no ischemia, decr uptake inferiorly same as 2013, EF=69%, norm wall motion, low risk...    Hyperlipid> on Niacin'500mg'$ ; FLP 1/16 showed TChol 150, TG 101, HDL 45, LDL 85; continue same, get wt down...    DM> on Metform500Bid; wt=222#, BMI=29; Labs 7/16 showed BS=129, A1c=6.7; rec to continue same med, better diet, get wt down!..    GI- GERD, dysphagia, Divertics, IBS, Polyps> on Protonix40, Miralax, Levsin0.125 prn; followed by DrPerry & stable- last colon 5/12 w/ severe divertics & 2 sm adenomas removed...    GU- Kid stone, BPH> on Flomax0.4 & Cialis prn; PSA remains wnl (PSA 1/16= 1.25)...    DJD, LBP> left knee complaints w/ shots from DrDuda,  similar for left shoulder; hx bilat hip AVN noted on prev CT Abd but he has min complaints and uses Tylenol prn (holding off on surg)...    Anxiety> he does not want anxiolytic meds.Jesse Santos EXAM shows Afeb, VSS, O2sat=97%;  Heent- neg, mallampati2;  Chest- clear w/o w/r/r;  Heart- RR w/o m/r/g;  Abd- soft, neg, nontender;  Ext- neg w/o c/c/e;  Neuro- intact...  Labs 7/16>  Chems- ok w/ BS=129, A1c=6.7, Cr=0.83... IMP/PLAN>>  Jesse Santos is stable, meds refilled, he's already had the 2016 Flu vaccine & up to date;  We discussed diet/ exercise/ wt reduction... ROV in 44mo  ~  October 15, 2015:  654moOV & Jesse Santos saw TP 07/2015 for a lesion on tongue- sent to Derm DrLupton ?lichenoid mucositis (rx w/ several steroid shots & topical meds) & may need referral to [facility]. He has mult somatic complaints> LBP, leg weakness, HAs, cough/ congestion/ clear mucous (yet he states exercise on treadmill for 3076mat 2.6mp70m.. We reviewed the following medical problems during today's office visit >>     HOH> he has bilat hearing aides...    AR> on allergy shots per DrVanWinkle, OTC antihist, Saline, Flonase; gets occas bouts of sinusitis but overall improved & no recent infections reported...     OSA> Sleep Study 2/04 w/ RDI=45 & desat to 88% w/ mod snoring & leg jerks, but he states he never could use the CPAP effectively & states he sleeps fine now if he stays on his side; denies daytime sleepiness or other issues...    COPD> exsmoker quit 1981; on AdvairHFA115-2spBid, Mucinex prn; he doesn't like Pred Rx; prev Bronchitic exac treated w/ Levaquin, Medrol, Mucinex & improved; needs incr exercise program...    HBP> on Aten25, Losar100; BP=126/70, he denies CP, palpit, dizzy, SOB, edema, etc...    CAD> on ASA81; followed by DrHochrein- CT Abd 2010 showed coronary calcif, Treadmill was neg; Myoview 3/13 was neg, wnl; last seen 4/15- no new symptoms, active w/ yard work, EKG showed wnl, they decided to do Treadmill (borderline ST  depression), then Myoview 6/15 showed NEG- no ischemia, decr uptake inferiorly same as 2013, EF=69%, norm wall motion, low risk...    Hyperlipid> on Niacin'500mg'$ ; FLP 1/17 showed TChol 132, TG 52, HDL 46, LDL 75; continue same, get wt down...    DM> on Metform500Bid; wt=220#, BMI=29; Labs 7/16 showed BS=129, A1c=6.7; rec to continue same med, better diet, get wt down; Labs 1/17 showed BS=134, A1c=6.8...  Jesse KitchenMarland KitchenGI- GERD, dysphagia, Divertics, IBS, Polyps> on Protonix40, Miralax, Levsin0.125 prn; followed by DrPerry & stable- last colon 5/12 w/ severe divertics & 2 sm adenomas  removed...    GU- Kid stone, BPH> on Flomax0.4 & Cialis prn; PSA remains wnl (PSA 1/16= 1.25, 1/17=1.06)...    DJD, LBP> left knee complaints w/ shots from DrDuda, similar for left shoulder; hx bilat hip AVN noted on prev CT Abd but he has min complaints and uses Tylenol prn (holding off on surg)...    Anxiety> he does not want anxiolytic meds.Jesse Santos EXAM shows Afeb, VSS, O2sat=98%;  Heent- neg, mallampati2;  Chest- clear w/o w/r/r;  Heart- RR w/o m/r/g;  Abd- soft, neg, nontender;  Ext- neg w/o c/c/e;  Neuro- intact...  LABS 10/15/15>  FLP- at goals on diet + niacin;  Chems- ok x BS=134, A1c=6.8 on Metform500bid;  CBC- wnl;  TSH=1.08;  PSA=1.06 IMP/PLAN>>  Jesse Santos has mult somatic complaints but objectively in good shape; we discussed ENT 2nd opinion re tongue lesion vs Med Center referral per Derm, he will decide; continue present Rx & ROV 34mo..  ~  April 13, 2016:  667moOV and after the last visit Jesse Santos saw DrShoemaker, ENT 10/29/15 for the lesion on his tongue- his note is reviewed in Care Everywhere, he concurred w/ topical steroid therapy for this chronic tongue irritation & gave him Triamcinolone cream;  DrLupton [facility]/16 7 note reviewed, bx proven lichenoid dermatitis, exam showed large erosions on lat border of tongue c/w oral erosive lichen planus; He rec Diflucan, Mycelex (for prob candida) &  Tacrolimus (Prograf) to dissolve in water & swish as directed=> pt indicates that he is improved, and they are planning f/u eval... we reviewed the following medical problems during today's office visit >>     HOH> he has bilat hearing aides & has been checked by DrShoemaker.    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    AR> on allergy shots per DrVanWinkle, OTC antihist, Saline, Flonase; gets occas bouts of sinusitis but overall improved & no recent infections reported...     OSA> Sleep Study 2/04 w/ RDI=45 & desat to 88% w/ mod snoring & leg jerks, but intol to CPAP & never used; states he sleeps fine now if he stays on his side; denies daytime sleepiness or other issues, naps 1/7...    COPD> exsmoker quit 1981; on AdvairHFA115-2spBid w/ spacer, Mucinex prn; he doesn't like Pred Rx; prev Bronchitic exac treated w/ antibiotics (Levaquin/ Augmentin), Medrol, Mucinex & improved; needs incr exercise program...    HBP> on Aten25, Losar100; BP=142/68, he denies CP, palpit, dizzy, SOB, edema, etc...    CAD> on ASA81; followed by DrHochrein- seen 12/19/15, CT Abd 2010 showed coronary calcif, Treadmill was neg (borderline ST depression); Myoview 3/13 was neg, wnl & Myoview 6/15 showed NEG- no ischemia, decr uptake inferiorly same as 2013, EF=69%, norm wall motion, low risk... He denies CP, palpit, dizzy, SOB, edema, etc; he remains active w/ yard work etc...    Hyperlipid> on Niacin50026mFLP 1/17 showed TChol 132, TG 52, HDL 46, LDL 75; continue same, get wt down...    DM> on Metform500Bid; wt=218#, BMI=29; Labs 7/16 showed BS=129, A1c=6.7; rec to continue same med, better diet, get wt down; Labs 1/17 showed BS=134, A1c=6.8 and A1c 04/13/16= 6.7, stable...    GI- GERD, dysphagia, Divertics, IBS, Polyps> on Protonix40, Miralax, Levsin0.125 prn; followed by DrPerry & stable- last colon 5/12 w/ severe divertics & 2 sm adenomas  removed...    GU- Kid stone, BPH> on Flomax0.4 &  Cialis prn; PSA remains wnl (PSA 1/16= 1.25, 1/17=1.06)...    DJD, LBP> left knee complaints w/ shots from DrDuda, similar for left shoulder; hx bilat hip AVN noted on prev CT Abd but he has min complaints and uses Tylenol prn (holding off on surg)...    Anxiety> he does not want anxiolytic meds.Jesse Santos EXAM shows Afeb, VSS, O2sat=98%;  Heent- neg, mallampati2;  Chest- clear w/o w/r/r;  Heart- RR w/o m/r/g;  Abd- soft, neg, nontender;  Ext- neg w/o c/c/e;  Neuro- intact...  LABS 04/13/16:  BMet- ordered but not done;  A1c=6.7.Jesse KitchenMarland Santos IMP/PLAN>>  Jesse Santos is stable w/ his mult medical issues as above- oral lichen planus improved on regimen from Derm at [facility]; BP controlled, he remains active w/o angina, FLP & DM well regualted=> continue same meds.  ~  October 15, 2016:  41moROV & Jesse Santos was seen by SG 07/2016 & TP 08/2016 w/ cough prod of green mucus, sinus congestion, but no f/c/s; he was placed on MTX by DrJorizzo, [facility] due to his tongue lesion felt to be oral lichen planus, 2=> incr to 3 tabs once per week along w/ Folate, Mycelex and Prograf (1 cap dissolved in water & swished);  He remains on AdvairHFA-2spBid & he's careful to rinse after these treatments too;  He was treated w/ Levaquin + Pred taper and he followed up w/ TP 09/08/16-- improved w/ decr cough & dyspnea, no wheezing, remains on Advair, flonase, claritin, mucinex... we reviewed the following medical problems during today's office visit >>     HOH> he has bilat hearing aides & has been checked by DrShoemaker.    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    AR> on allergy shots per DrVanWinkle, OTC antihist, Saline, Flonase; gets occas bouts of sinusitis but overall improved & no recent infections reported...     OSA> Sleep Study 2/04 w/ RDI=45 & desat to 88% w/ mod  snoring & leg jerks, but intol to CPAP & never used; states he sleeps fine now if he stays on his side; denies daytime sleepiness or other issues, naps 1/7...    COPD> exsmoker quit 1981; on AdvairHFA115-2spBid w/ spacer, Mucinex prn; he doesn't like Pred Rx; prev Bronchitic exac treated w/ antibiotics (Levaquin/ Augmentin), Medrol, Mucinex & improved; needs incr exercise program...    HBP> on Aten25, Losar100; BP=116/60, he denies CP, palpit, dizzy, SOB, edema, etc...    CAD> on ASA81; followed by DrHochrein- seen 12/19/15, CT Abd 2010 showed coronary calcif, Treadmill was neg (borderline ST depression); Myoview 3/13 was neg, wnl & Myoview 6/15 showed NEG- no ischemia, decr uptake inferiorly same as 2013, EF=69%, norm wall motion, low risk... He denies CP, palpit, dizzy, SOB, edema, etc; he remains active w/ yard work etc...    Hyperlipid> on Niacin5039m FLP 1/17 showed TChol 132, TG 52, HDL 46, LDL 75; continue same, get wt down...    DM> on Metform500Bid; wt=216#, BMI=29; Labs 1/17 showed BS=134, A1c=6.8; rec to continue same med, better diet, get wt down; Labs 2/18 showed BS=136, A1c=6.7; stable...    GI- GERD, dysphagia, Divertics, IBS, Polyps> on Protonix40, Miralax, Levsin0.125 prn; followed by DrPerry & stable- last colon 5/12 w/ severe divertics & 2 sm adenomas removed; he wants off PPI=> change to PEPCID40...    GU- Kid stone, BPH> on Flomax0.4 & Cialis prn; PSA remains wnl (PSA 1/16= 1.25, 1/17=1.06, 2/18=1.45)...Jesse KitchenMarland Santos  DJD, LBP> left knee complaints w/ shots from DrDuda, similar for left shoulder; hx bilat hip AVN noted on prev CT Abd but he has min complaints and uses Tylenol prn (holding off on surg)...    Anxiety> he does not want anxiolytic meds.Jesse Santos EXAM shows Afeb, VSS, O2sat=99%;  Wt=216#, 6'2"Tall, BMI=28;  Heent- neg, mallampati2;  Chest- clear w/o w/r/r;  Heart- RR w/o m/r/g;  Abd- soft, neg, nontender;  Ext- neg w/o c/c/e;  Neuro- intact...  CXR 08/10/16>  Norm heart size, atherosclerotic  Ao, clear lungs- NAD, degen changes in Tspine w/ some scoliosis...  LABS 10/15/16>  FLP- at goals on Niaspan;  Chems- ok w/ BS=136, A1c=6.7;  CBC- wnl w/ Hg=13.9, Fe=169 (42%sat), B12=504;  TSH=1.04,  PSA=1.45...  IMP/PLAN>>  Jesse Santos is still dealing w/ his oral prob= Lichen Planus & improved w/ treatment program under the direction of [facility]- on MTX 3/wk, Prograf, Mycelex;  His wife is concerned about his being on Protonix40 for many yrs, followed by DrPerry w/ GERD/ dysphagia, last EGD 2009 was essentially normal- OK to stop the PPI & change to Pepcid40;  Continue other meds the same + diet/ exercise...   ~  November 18, 2016:  38moROV & post hospital visit> DMarden Noblewas hospitalized 3/1 - 11/15/16 by [facility] w/ multilobar community acquired pneumonia (predom lingular w/ CT abn in LLL & RLL as well);  He woke w/ cough, malaise, myalgias, & chills;  Temp in ER was 103, BP was wnl, O2sat was 94% on 2L/min, CXR showed LLL pneumonia, no organism specified, treated w/ rocephin & zithromax=> disch on oral zithromax & medrol taper...     Currently feeling better- sl sore throat, cough, sm amt beige sput, no hemoptysis, +SOB/DOE & still "weak", some chest discomfort=> we decided to f/u CXR/ Labs (see below)...    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    AR> on allergy shots per DrVanWinkle, OTC antihist, Saline, Flonase; gets occas bouts of sinusitis but overall improved & no recent infections reported...     OSA> Sleep Study 2/04 w/ RDI=45 & desat to 88% w/ mod snoring & leg jerks, but intol to CPAP & never used; states he sleeps fine now if he stays on his side; denies daytime sleepiness or other issues, naps 1/7...    COPD> exsmoker quit 1981; on AdvairHFA115-2spBid w/ spacer, Mucinex prn; he doesn't like Pred Rx; prev Bronchitic exac treated w/ antibiotics (Levaquin/ Augmentin),  Medrol, Mucinex & improved; needs incr exercise program... EXAM shows Afeb, VSS, O2sat=99%;  Wt=216#, 6'2"Tall, BMI=28;  Heent- neg, mallampati2;  Chest- clear w/o w/r/r;  Heart- RR w/o m/r/g;  Abd- soft, neg, nontender;  Ext- neg w/o c/c/e;  Neuro- intact...  CXR 11/12/16 showed LLL pneumonia, borderline cardiomegaly, aortic calcif  CT Chest 11/13/16>  Neg for PE, multilobar left lung pneumonia, poss early RLL infiltrate, calcif aortic & coronary arteries...  CXR today 11/18/16 showed norm heart size, aortic atherosclerosis, left lung opacity is resolving/ improved, no effusion etc...  LABS 11/2016>  Chems- ok x BS 110-190;  CBC- ok x WBC 14K=>9K  LABS 11/18/16>  Chems- ok w/ BS=110;  CBC- improved w/ Hg=13.7, WBC=8.5 IMP/PLAN>>  DMarden Noblehas improved from his recent bout of pneumonia (no organism identified); he has finished the antibiotics (Zithromax), & weaning off the Medrol;  We plan ROV recheck in 3-4weeks...   ~  December 09, 2016:  3wk [place] is improved overall "just weak" he says;  BP is good at home on Losar50 reading 130-140/ 60-70 at home;  He denis much cough, sput, no hemoptysis, no CP, SOB improved & now active on his treadmill, doing housework, etc;  His CC is nasal drainage;  F/u CXR today is resolved- NAD...     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    AR> on allergy shots per DrVanWinkle, OTC antihist, Saline, Flonase; gets occas bouts of sinusitis but overall improved & no recent infections reported...     OSA> Sleep Study 2/04 w/ RDI=45 & desat to 88% w/ mod snoring & leg jerks, but intol to CPAP & never used; states he sleeps fine now if he stays on his side; denies daytime sleepiness or other issues, naps 1/7...    COPD> exsmoker quit 1981; on AdvairHFA115-2spBid w/ spacer, Mucinex prn; he doesn't like Pred Rx; prev Bronchitic exac treated w/  antibiotics (Levaquin/ Augmentin), Medrol, Mucinex & improved; needs incr exercise program...    Medical Issues>  HBP, CAD, HL, DM, Gastroparesis, BPH, DJD, anxiety... EXAM shows Afeb, VSS, O2sat=98%;  Wt=211#, 6'2"Tall, BMI=28;  Heent- neg, mallampati2;  Chest- clear w/o w/r/r;  Heart- RR w/o m/r/g;  Abd- soft, neg, nontender;  Ext- neg w/o c/c/e;  Neuro- intact...  CXR 12/09/16 (independently reviewed by me in the PACS system) showed norm heart size, clear lungs- NAD... IMP/PLAN>>  Jesse Santos's pneumonia has resolved & he is approaching his baseline "just weak" he says;  Rec to continue his baseline regimen, diet, exercise, etc...   ~  April 14, 2017:  58moROV & Jesse Santos reports that his Lichen Planus (tongue soreness) is improved and [facility]  He notes that his H2-blocker Pepcid isn't enough for his reflux & feels he needs to go back on Protonix40- ok (take it 360m before dinner);  Breathing is good but it is "dusty at my place" and this bothers his sinuses> prev on allergy shots for 22 yrs and DrESL stopped them when he retired, still uses ClNordstromdenies SOB, CP, etc...  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    AR> prev on allergy shots per DrESL, OTC antihist, Saline, Flonase; gets occas bouts of sinusitis but overall improved but he's concerned not doing as well off the shots- asked to check in w/ DrVanWinkle.    OSA> Sleep Study 2/04 w/ RDI=45 & desat to 88% w/ mod snoring & leg jerks, but intol to CPAP & never used; states he sleeps fine now if he stays on his side; denies daytime sleepiness or other issues, naps 1/7...    COPD> exsmoker quit 1981; on AdvairHFA115-2spBid w/ spacer, Mucinex prn; he doesn't like Pred Rx; prev Bronchitic exac  treated w/ antibiotics (Levaquin/ Augmentin), Medrol, Mucinex & improved; needs incr exercise program...    Medical Issues>  HBP (controlled), CAD (DrHochrein last 4/17), HL (on Niaspan, FLP ok 2/18), DM (on Metform, Labs 2/18 w/ BS-167 A1c-6.7) , Gastroparesis, BPH, DJD, anxiety... EXAM shows Afeb, VSS, O2sat=99%;  Wt=215#, 6'2"Tall, BMI=28;  Heent- neg, mallampati2;  Chest- clear w/o w/r/r;  Heart- RR w/o m/r/g;  Abd- soft, neg, nontender;  Ext- neg w/o c/c/e;  Neuro- intact... IMP/PLAN>>  OK back on Protonix40 taken 88mn before dinner;  Continue same meds and check back w/ DrVanWinkle regarding allergy shots;  OK- refills today & we reviewed diet & exercise...  ~  May 27, 2017:  6wk RMission Bendhas seen Ortho-DrDuda for back pain & right hip pain, XRays showed arthritis; Tramadol provided some temporary relief, they decided to try a course of Pred w/ rov to assess for MRI & need for surg... Pt called last wk stating that he's seen DrVanWinkle w/ sinus symptoms and was given Cefdinir x10d but he reports only min better; also on the Pred from DrDuda; still c/o sinus congestion, pressure, light yellow mucus draining, still using Mucinex, Claritin, Flonase & now reporting chest tightness & some wheezing; he states "I can't clear my sinuses", denies f/c/s but has drainage/ blowing/ coughing...    EXAM shows Afeb, VSS, O2sat=97%;  Wt=215#;  Heent- +cerumen impactions, +sinus congestion, mallampati2;  Chest- clear w/o w/r/r;  Heart- RR w/o m/r/g;  Abd- soft, neg, nontender;  Ext- neg w/o c/c/e;  Neuro- intact..  CXR 05/27/17 (independently reviewed by me in the PACS system)> norm heart size, sl pleural thickening, sl incr markings- NAD...  LABS 05/27/17 showed Chems- wnl x BS=171, CBC- Hg=13.1, WBC=11K;  Sed=16...  Sputum C&S 05/27/17> NEG- NTF only IMP/PLAN>>  DMarden Noblehas had a long hx recurrent sinusitis & allergies [facility]; now off shots & having more  difficulty; he has seen DrVanWinkle in follow up;  We discussed Rx w/ Levaquin '750mg'$ /d x10d + Align & Activia;  Given Depo120 & continue Pred taper from DrDuda; continue Mucinex, Flonase, Claritin, [place] and f/u w/ ENT DrShoemaker for cerumen/ hearing aides/ sinusitis...  ADDENDUM>>  Sput from 05/27/17 was also set up for Fungal smear&culture> lab now reports smear pos for fungal elements, and cult shows Aspergillus flavus, Aspergillus fumigatus, Cryptococcus laurentii, & Bipolaris species;  He is on chr therapy [facility] on MTX (3/wk), Prograf (oral swish), Mycelex (troches), & Folate;  He has had mult sinus infections treated periodically w/ Levaquin, Augmentin, etc;  He also gets freq courses for Pred [place] and from Ortho for back & right hip pain... I do not feel that these 4 agents are causing dis in his sinuses or lungs- suspect oral contam?  Plan is to try a course of MMW & repeat Sputum Fungal culture=> discussed w/ pt.  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   ~  October 18, 2017:  4-553moOV & after last OV pt saw ID-DrCampbell on 07/13/17>  2 recent sput cultures + for various fungi on cultures; on immunosuppressive rx for lichen planus; however there was no evid for active fungal infection- no pneumonia, sinusitis, or otitis; no further testing or treatment needed; asked to ret prn...    He had ORTHO f/u- DrDuda 06/03/17>  Prev right sided radicular pain & he reports resolved on his recent antibiotics + Pred for sinuses; asked to ret prn...    [facility]  Diabetic eye exam, s/p bilat cat surg w/ lens implants, no retinopathy    He  saw DERM-[facility]Jorizzo [facility] 27/03/50>  Oral lichen planus on Mycelex troche one dissolved in mouth daily,  Diflucan100-one tab weekly, Prograf66m cap dissolved in water & swish as directed, MTX2.50-9FGHW/EX Folic117md;  Improved since MTX added, tolerating meds, still some burning, & they saw oral candida (Rx Diflucan/ Mycelex)...       Pt called 10/08/17 c/o nasal drainage- yellow green + some diarrhea, fatigue, body aches; we called in Flagyl 25043md x7d; he has mult somatic complaints that [place] w/ recent diarrhea; under a lot of stress w/ tree damage on his property- offered Ativan rx 1mg28m/2 to 1 tab Bid...  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    AR> prev on allergy shots per DrESL, OTC antihist, Saline, Flonase; gets occas bouts of sinusitis but overall improved but he's concerned not doing as well off the shots- asked to check in w/ DrVanWinkle.    OSA> Sleep Study 2/04 w/ RDI=45 & desat to 88% w/ mod snoring & leg jerks, but intol to CPAP & never used; states he sleeps fine now if he stays on his side; denies daytime sleepiness or other issues, naps 1/7...    COPD> exsmoker quit 1981; on AdvairHFA115-2spBid w/ spacer, Mucinex prn; he doesn't like Pred Rx; prev Bronchitic exac treated w/ antibiotics (Levaquin/ Augmentin), Medrol, Mucinex & improved; needs incr exercise program...    Medical Issues>  HBP (controlled), CAD (DrHochrein last 4/17), HL (on Niaspan, FLP ok 2/18), DM (on Metform, Labs 2/19 w/ BS=140 A1c=7.2, adding Glimep2) , Gastroparesis, BPH, DJD- Rhip & back pain, anxiety... EXAM shows Afeb, VSS, O2sat=99%;  Wt=215#, 6'2"Tall, BMI=28;  Heent- neg, mallampati2;  Chest- clear w/o w/r/r;  Heart- RR w/o m/r/g;  Abd- soft, neg, nontender;  Ext- neg w/o c/c/e;  Neuro- intact...  [facility] w/ BS=156, Cr=0.84, LFTs wnl;  CBC- ok w/ Hg=12.9, wbc=6.2...  Jesse KitchenMarland KitchenBS 10/18/17>  FLP- at goals on Niacin500;  Chems- ok x BS=140, A1c=7.2 on Metform500Bid- ADD GLIMEP2mg/38m CBC- ok w/ Hg=12.8, wbc=6.4;  TSH=0.76;  PSA=2.18... IMP/PLAN>>  FLP is ok on Niaspan500, continue same;  DM control not as good w/ BS=140 & A1c=7.2 on Metform500bid, rec adding glimep2mgQa72mhe will maintain f/[facility]/ [facility] and try to avoid infections, may need to get back on allergy shots...    ~  December 09, 2017:  48mo RO548moadd-on appt for recurrent sinusisit> Pt presents c/o 2wk hx sinus congestion/ pressure, drainage & blowing out yellow/green mucus, denies fever but feel chiily & aching/sore/ mult somatic complaints;  He's been taking Claritin, Flonase, Mucinex, and Ocean nasal mist;  He has a hx of recurrent sinus infections & we discussed referral to ENT for additional eval... We reviewed his pulm/ medical problems as listed above...    EXAM shows Afeb, VSS, O2sat=99%;  Wt=222#, 6'2"Tall, BMI=29;  Heent- +sinus congestion, erythema, & max sinus tenderness, mallampati2;  Chest- clear w/o w/r/r;  Heart- RR w/o m/r/g;  Abd- soft, neg, nontender;  Ext- neg w/o c/c/e;  Neuro- intact... IMP/PLAN>>  Jesse Santos haMarden Noblet another sinus infection & we will treat w/ Levaquin 500mgx1040md Depo80 + Pred 20mg tab40mapering sched (see AVS);  OK to continue the [place], [place], & Mucinex as before;  We will arrange for the ENT appt...          Problem List:  HEARING LOSS (ICD-389.9) - he has bilat hearing aides...  ALLERGIC RHINITIS (ICD-477.9) - on allergy shots from DrESL Gaet[place])... plus Claritin, Saline, Mucinex, Flonase. ~  occas bouts of sinusitis requiring antibiotic Rx... ~  8/13: he had allergy f/u DrVanWinkle> skin testing 2010 pos to dust mites and molds; doing well on shots once/mo; stable & no changes made... ~  3/14: he presented w/ an upper resp infection & treated w/ Depo,  Dosepak, Zithromax... ~  8/14:  He had allergy f/u DrVanWinkle> Advair, Flonase, Saline, Mucinex; he checked FENO- reported normal... ~  7/15: stable on allergy meds from DrVanWinkle, plus AdvairHFA115; he had sinus/ AB exac 5/15 treated by TP w/ ZPak=> Levaquin & resolved...  OBSTRUCTIVE SLEEP APNEA (ICD-327.23) - sleep study 8/04 showed RDI= 45 & desat to 88%... mod snoring and leg jerks without sleep disruption... CPAP perscribed but not using it now- states he can't sleep w/ it on & furthermore he is resting well as long as he stays on his side... denies daytime hypersomnolence & not interested in re-evaluation.  COPD (ICD-496) - ex-smoker, quit 1981 >>  ~  on ADVAIR 100Bid, PROAIR Prn, MUCINEX Bid... he had Pneumovax in 2008... doesn't like Pred Rx...  ~  10/11:  doing well except recent cough, min green sputum (no change in dyspnea)- we will Rx w/ Augmentin, incr Mucinex/ Fluids. ~  4/12:  Breathing at baseline, continue maintenance meds... ~  CXR 12/13 showed normal heart size, clear lungs, NAD... ~  1/14:  He's had 2 exac this past yr- saw TP w/ Rx & improved... ~  1/15:  He continues to do well on Advair + allergy Rx from DrVanWinkle w/ Flonase, Saline, Mucinex... ~  7/15: exsmoker quit 1981; on AdvairHFA115-2spBid, Mucinex prn; he doesn't like Pred Rx; he had Bronchitic exac treated by TP 5/15 w/ ZPak, then Levaquin, Mucinex, etc & improved; needs incr exercise program... ~  10/15: seen w/ refractory bronchitic episode reqiring ZPak=>Levaquin, Medrol, Mucinex, Hydromet, etc... ~  1/16: He had a URI/ AB exac treated 10/15 w/ Levaquin/ Medrol/ Mucinex & resolved; now at baseline on Advair115-2spBid etc... ~  11/2016> he developed a LLL pneumonia (nos) & resolved w/ Rocephin/ Zithromax, Medrol, Nebs, etc...  HYPERTENSION (ICD-401.9) - prev on diet alone> then on Diovan320, but we decided to change to LOSARTAN '100mg'$ /d 5/13 to save $$ ~  He claims that Norvasc & HCTZ "stopped my urine  flow"... ~  10/11:  BP= 128/80 & even better at home he says... denies HA, fatigue, visual changes, CP, palipit, dizziness, syncope, dyspnea, edema, etc... ~  4/12:  Pt has seen TP, DrHochrein, & checking BP daily at home; tried on Norvasc but claims it decr his urine flow; now on Diovan & improved w/ BP= 132/70... ~  5/12:  BP remains well controlled on Diovan '320mg'$ /d= 120/62 today. ~  11/12:  BP= 142/82 on Diovan160 now; denies CP, palpit, dizzy, SOB, edema... ~  5/13:  BP= 128/66 & he's requesting change to cheaper med; rec switch Diovan to LOSARTAN '100mg'$ /d... ~  1/14: on Losar100; BP=140/84, he notes incr HR he says & we decided to add low dose ATENOLOL'25mg'$ /d... ~  7/14: on Aten25, Losar100> BP=128/72 & he denies CP, palpit, SOB, edema, etc... ~  1/15: BP controlled on Aten25, Losar100 w/ BP= 130/80 & he denies  CP, palpit, SOB, edema. ~  7/15: on Aten25, Losar100; BP=124/70, he denies CP, palpit, dizzy, SOB, edema, etc. ~  10/15: on Aten25, Losar100; BP= 140/60 & remains stable hemodynamically... ~  BP remains under good control on same meds...  R/O CAD (ICD-414.00) - on ASA 38m/d... Followed by DrHochrein & his notes are reviewed. ~  hx neg cardiac cath 1988 [place]Joe [place]... ~  NuclearStressTest 10/02 was norm- no scar or ischemia, EF=55%. ~  CT Abd 4/10 via ER showed coronary calcif... referred to Cards. ~  Eval by DrHochrein 6/10 showed norm EKG & cardiac exam... treadmill ordered. ~  Treadmill 6/10 showed reasonable exerc tolerance, no ischemic changes, few PVCs, sl incr BP... ~  EKG 3/13 showed NSR, rate76, WNL, NAD... ~  Myoview 3/13 was neg> exercised for 619m, stopped for fatigue & CP; no ST seg changes, occas PACs & PVCs; hypertensive BP response; no ischemia, EF=71%, normal wall motion... ~  He saw DrHochrein 4/14> hx CAD, doing satis on ASA & BP rx w/o angina & rec to follow program of aggressive risk factor reduction, no change in meds.  ~  EKG 4/14 showed NSR, rate60, wnl,  NAD.. ~  6/15: .on ASA81; followed by DrHochrein- last seen 4/15- no new symptoms, active w/ yard work, EKG showed wnl, they decided to do Treadmill (borderline ST depression), then Myoview 6/15 showed NEG- no ischemia, decr uptake inferiorly same as 2013, EF=69%, norm wall motion, low risk...  MIXED HYPERLIPIDEMIA (ICD-272.2) - low HDL and NIASPAN 50029m started 4/09... ~  FLPFreeport09 showed TChol 160, TG 106, HDL 26, LDL 113... rec- diet + Niaspan 500/d. ~  FLP 10/09 showed TChol 155, TG 78, HDL 33, LDL 106... ~  FLP 4/10 showed TChol 133, TG 63, HDL 30, LDL 91... rec> continue same. ~  FLP 4/11 showed TChol 144, TG 61, HDL 38, LDL 94 ~  FLP 10/11 showed TChol 155, TG 81, HDL 40, LDL 99 ~  FLP 4/12 showed TChol 142, TG 69, HDL 38, LDL 90 ~  FLP 11/12 showed TChol 165, TG 82, HDL 43, LDL 106 ~  FLP 5/13 in Niasp500 showed TChol 155, TG 72, HDL 46, LDL 95... rec change to generic. ~  FLP 1/14 on Niacin500 showed TChol 140, TG 56, HDL 39, LDL 90 ~  FLP 1/15 on Niaspan500 showed TChol 148, TG 57, HDL 43, LDL 94  ~  FLP 1/16 on Niaspan500 showed TChol 150, TG 101, HDL 45, LDL 85 ~  FLP 1/17 on Niaspan500 showed TChol 132, TG 52, HDL 46, LDL 75  DM (ICD-250.00) - on diet + METFORMIN 500m54m... he reports BS at home all in the 120-130's... ~  labs 4/09 showed BS= 132, HgA1c= 6.6... rMarland KitchenMarland Santos- same med, better diet... ~  labs 10/09 showed BS= 131, HgA1c= 6.3... ~Jesse KitchenMarland Kitchenlabs 4/10 showed BS= 117, HgA1c= 6.2... rMarland Kitchenc> same meds/ diet Rx. ~  labs 10/10 showed BS= 138, A1c= 6.5 ~  labs 4/11 showed BS= 121, A1c= 6.5... OMarland KitchenMarland KitchenMetform500Bid + diet. ~  Dilated eye exam 5/11 by DrShapiro- no retinopathy... ~  labs 10/11 (wt=228#) showed BS= 118, A1c= 7.1... nMarland Kitchent as good- get on diet or more meds. ~  Labs 4/12 (wt=229#) showed BS= 119, A1c= 7.3... Wrong direction, may need more meds, get wt down! ~  5/12:  Ophthalmology check by DrShapiro was neg- no retinopathy... ~  Labs 11/12 showed BS= 136, A1c= 7.0 ~  Labs 5/13 on  MetformBid showed BS= 132, A1c= 7.1...Jesse KitchenMarland Santos  Continue same, get wt down. ~  Ophthalmology check from DrShapiro 5/13> no DM retinopathy... ~  Labs 1/14 showed BS= 134, A1c= 7.5; not as good- needs better diet, get wt down, same meds for now... ~  Labs 7/14 on Metform500Bid showed BS= 140, A1c= 7.3  ~  Labs 1/154 on Metform500Bid showed BS= 129 & A1c= 7.0, and we reviewed meds/ diet/ exercise/ weight... ~  Labs 7/15 on Metform500Bid showed BS= 126, A1c= 7.1 & reminded of need for diet/ exerc/ wt reduction... ~  Labs 1/16 on Metform500Bid showed BS= 133, A1c= 7.2 ~  Labs 1/17 on Metform500Bid showed BS= 134, A1c= 6.8  GERD (ICD-530.81) & ? GASTROPARESIS - on PROTONIX '40mg'$ /d, & off Reglan per DrPerry... ~   last EGD 6/07 by DrPerry showed gastic polyp... ~  8/10:  given Compazine suppos for gastroparesis flair by DrGessner. ~  4/12:  C/o intermittent dysphagia & referred back to GI for further eval (colonoscopy due as well)...  DIVERTICULOSIS OF COLON (ICD-562.10) - he uses MIRALAX Bid regularly... IRRITABLE BOWEL SYNDROME (ICD-564.1) COLONIC POLYPS (ICD-211.3) ~  last colonoscopy 5/07 by DrSam showed divertics only... f/u 92yr. ~  CT Abd 4/10 in ER showed atx bases, coronary calcif, s/p GB, abdAo calcif, 167mrenal stone, divertics, bilat hip AVN. ~  Follow up colonoscopy by DrPerry 5/12 showed severe diverticulosis, 2 sm polyps= tubular adenoma & f/u planned 5 yrs...  RENAL CALCULUS, HX OF (ICD-V13.01) BENIGN PROSTATIC HYPERTROPHY, HX OF (ICD-V13.8) - on FLOMAX 0.'4mg'$ /d w/ improved symptoms (he states flow better since stopping Vit E supplement). ~  4/12:  Routine PSA= 4.9 (it was 1.02 4/11) & we rx w/ Doxy Bid x14d, thewn plan recheck PSA==> 1.30  DEGENERATIVE JOINT DISEASE (ICD-715.90) - pt states "I have a bum knee" and eval by DrDuda w/ viscosupplementation shots... Note: Abd CT w/ bilat hip AVN noted... takes TYLENOL Prn... ~  11/10:  s/p left knee arthroscopy by DrDuda... ~  10/11:  s/p fall  w/ left knee injury- s/p cortisone shot by DrDuda; hx shot in left shoulder too. ~  7/14: he is c/o incr pain in left hip area; known hx AVN & needs f/u by Ortho...  BACK PAIN, LUMBAR (ICD-724.2)  ANXIETY (ICD-300.00) - not currently on meds for nerves.  ANEMIA (ICD-285.9) - prev hx of GIB... ~  labs 4/09 showed Hg= 14.1 ~  labs 4/10 showed Hg= 13.6 ~  labs 4/11 showed Hg= 13.4 ~  Labs 4/12 showed Hg= 13.8 ~  Labs 3/13 showed Hg= 13.1 ~  Labs 1/14 showed Hg= 13.6 ~  Labs 1/15 showed Hg= 13.7 ~  Labs 1/16 showed Hg= 14.1 ~  CBC remains WNL...  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  Health Maintenance: ~  GI: followed by DrPerry> Colonoscopy 5/07 by DrSam... f/u planned 5 yrs. ~  GU:  See PSAs recorded above... ~  Immunizations: Pneumovax in 2008 @ age 82 PRFlorenceiven 7/15; Tetanus- given 4/10;  he gets yearly seasonal Flu vaccine.   Past Surgical History:  Procedure Laterality Date  . CATARACT EXTRACTION    . CHOLECYSTECTOMY    . KNEE SURGERY  11/10  . SKIN CANCER EXCISION  3/10   left hand     Outpatient Encounter Medications as of 12/09/2017  Medication Sig  . acetaminophen (TYLENOL) 500 MG tablet Take 500 mg by mouth every 6 (six) hours as needed for headache (pain).   . Jesse Kitchenspirin EC 81 MG tablet Take 81 mg  by mouth at bedtime.  Jesse Santos atenolol (TENORMIN) 25 MG tablet Take 1 tablet (25 mg total) by mouth daily.  . Cholecalciferol (VITAMIN D) 2000 UNITS CAPS Take 2,000 Units by mouth daily.   . clotrimazole (MYCELEX) 10 MG troche Take 10 mg by mouth daily.   . COMPRO 25 MG suppository PLACE 1 SUPPOSITORY (25 MG TOTAL) RECTALLY EVERY 12 (TWELVE) HOURS AS NEEDED FOR NAUSEA.  Jesse Santos dextromethorphan-guaiFENesin (MUCINEX DM) 30-600 MG per 12 hr tablet Take 1 tablet by mouth every 12 (twelve) hours.  . fluticasone (FLONASE) 50 MCG/ACT nasal spray Place 2 sprays into both nostrils daily.  . fluticasone-salmeterol (ADVAIR HFA) 115-21 MCG/ACT inhaler Inhale 1 puff into  the lungs 2 (two) times daily.  . folic acid (FOLVITE) 1 MG tablet Take 1 mg by mouth daily with lunch.   . glimepiride (AMARYL) 2 MG tablet Take 1 tablet (2 mg total) by mouth daily with breakfast.  . hyoscyamine (LEVSIN SL) 0.125 MG SL tablet Place 1 tablet (0.125 mg total) under the tongue every 4 (four) hours as needed. As needed for abdominal cramping  . lidocaine (ASPERCREME W/LIDOCAINE) 4 % cream Apply 1 application topically daily as needed (pain).   Jesse Santos loratadine (CLARITIN) 10 MG tablet Take 10 mg by mouth daily as needed for allergies.   Jesse Santos losartan (COZAAR) 50 MG tablet Take 1 tablet (50 mg total) by mouth daily.  . metFORMIN (GLUCOPHAGE) 500 MG tablet Take 1 tablet (500 mg total) by mouth 2 (two) times daily with a meal.  . methotrexate (RHEUMATREX) 2.5 MG tablet Take 3 tablets (7.5 mg total) by mouth See admin instructions. Take 3 tablets (7.5 mg) by mouth on Tuesdays with lunch, starting 11/24/2016  . Multiple Vitamins-Minerals (MULTIVITAMIN WITH MINERALS) tablet Take 1 tablet by mouth daily.    . niacin (NIASPAN) 500 MG CR tablet Take 1 tablet (500 mg total) by mouth at bedtime.  . nitroGLYCERIN (NITROSTAT) 0.4 MG SL tablet Place 1 tablet (0.4 mg total) under the tongue every 5 (five) minutes as needed for chest pain. (Patient taking differently: Place 0.4 mg under the tongue every 5 (five) minutes as needed for chest pain. )  . ONE TOUCH ULTRA TEST test strip TEST BLOOD SUGAR ONCE A DAY AS DIRECTED (DX E11.9)  . pantoprazole (PROTONIX) 40 MG tablet Take 1 tablet (40 mg total) by mouth daily.  . polyethylene glycol powder (GLYCOLAX/MIRALAX) powder TAKE 17 GRAMS BY MOUTH TWICE DAILY AS NEEDED  . Probiotic Product (PROBIOTIC PO) Take 1 tablet by mouth daily.  . sodium chloride (OCEAN) 0.65 % nasal spray Place 1 spray into the nose 3 (three) times daily as needed for congestion.    . tacrolimus (PROGRAF) 1 MG capsule Take 1 mg by mouth See admin instructions. Dissolve 1 capsule (1 mg) in  500 ml of water and keep in refrigerator - swish and spit small amount twice daily  . tamsulosin (FLOMAX) 0.4 MG CAPS capsule Take 1 capsule (0.4 mg total) by mouth 2 (two) times daily.  . traMADol (ULTRAM) 50 MG tablet TAKE ONE TABLET BY MOUTH THREE TIMES A DAY AS NEEDED FOR PAIN  . [DISCONTINUED] fluconazole (DIFLUCAN) 100 MG tablet Take 100 mg by mouth daily. One a day for 5 days and 1 a week for 5 weeks.  . [DISCONTINUED] metroNIDAZOLE (FLAGYL) 250 MG tablet Take 1 tablet (250 mg total) by mouth 3 (three) times daily.  . [DISCONTINUED] predniSONE (DELTASONE) 10 MG tablet TAKE TWO TABLETS BY MOUTH EVERY MORNING. WEAN OFF  AS SYMPTOMS RESOLVE  . levofloxacin (LEVAQUIN) 500 MG tablet Take 1 tablet (500 mg total) by mouth daily.  . predniSONE (DELTASONE) 20 MG tablet Take as directed   No facility-administered encounter medications on file as of 12/09/2017.     Allergies  Allergen Reactions  . Prednisone Other (See Comments)    REACTION: high dose intolerance - causes numbness from waist down - low dose tapered course ok    Immunization History  Administered Date(s) Administered  . H1N1 08/21/2008  . Influenza Split 06/01/2011, 05/26/2012  . Influenza Whole 06/06/2008, 06/21/2009, 06/06/2010  . Influenza, High Dose Seasonal PF 07/03/2016, 07/02/2017  . Influenza,inj,Quad PF,6+ Mos 06/07/2013, 07/09/2014, 06/10/2015  . Pneumococcal Conjugate-13 04/05/2014  . Pneumococcal Polysaccharide-23 06/30/2006  . Zoster 09/14/2010    Current Medications, Allergies, Past Medical History, Past Surgical History, Family History, and Social History were reviewed in [employer] record.    Review of Systems         See HPI - all other systems neg except as noted... The patient complains of decreased hearing, dyspnea on exertion, headaches, and difficulty walking.  The patient denies anorexia, fever, weight loss, weight gain, vision loss, hoarseness, chest pain, syncope,  peripheral edema, prolonged cough, hemoptysis, abdominal pain, melena, hematochezia, severe indigestion/heartburn, hematuria, incontinence, muscle weakness, suspicious skin lesions, transient blindness, depression, unusual weight change, abnormal bleeding, enlarged lymph nodes, and angioedema.     Objective:   Physical Exam      WD, WN, 82 y/o WM in NAD... GENERAL:  Alert & oriented; pleasant & cooperative... 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 NECK:  Supple w/ fairROM; no JVD; normal carotid impulses w/o bruits; no thyromegaly or nodules palpated; no lymphadenopathy. CHEST:  Clear to P & A w/o wheezing, rhonchi, rales, or signs of consolidation... HEART:  Regular Rhythm; without murmurs/ rubs/ or gallops detected...  ABDOMEN:  Soft & nontender; normal bowel sounds; no organomegaly or masses palpated., no guarding, or rebound EXT: without deformities, mild arthritic changes; no varicose veins/ venous insuffic/ or edema. NEURO:  CN's intact;  no focal neuro deficits... DERM:  No lesions noted; no rash etc...  RADIOLOGY DATA:  Reviewed in the EPIC EMR & discussed w/ the patient...  LABORATORY DATA:  Reviewed in the EPIC EMR & discussed w/ the patient...   Assessment & Plan:    Lesion on tongue>. LICHEN PLANUS and he is improved on Rx from [place] (MTX, Mycelex, & Prograf=> dissolve in water/ swish/ & spit)...  Recurrent Sinusitis> Hx Acute Bronchitis> presented 10/15 w/ refractory bronchitic episode; given ZPak=>Levaquin, Depo80/ Medrol dosepak, Mucinex, Hydromet, etc=> resolved... COPD exac>  He continues on Advair, Proair rescue, Mucinex; also Claritin, Flonase, Saline; stable continue same Rx... LLL Pneumonia 11/2016>  Hosp by [facility] & treated w/ Rocephin/Zithromax, Medrol taper, NEBS, etc & improved => resolved... 05/27/17>   Jesse Santos has had a long hx recurrent sinusitis & allergies  followed by DrESL at [facility] [place]; now off shots & having more difficulty; he has seen DrVanWinkle in follow up;  We discussed Rx w/ Levaquin '750mg'$ /d x10d + Align & Activia;  Given Depo120 & continue Pred taper from DrDuda; continue Mucinex, Flonase, Claritin, Ocean and f/u w/ ENT DrShoemaker for cerumen/ hearing aides/ sinusitis... 10/18/17>   FLP is ok on Niaspan500, continue same;  DM control not as good w/ BS=140 & A1c=7.2 on Metform500bid, rec adding glimep'2mg'$ Qam; he will maintain f/u  w/ [facility]; otherw continue same meds and try to avoid infections, may need to get back on allergy shots. 12/09/17>   Jesse Santos has yet another sinus infection & we will treat w/ Levaquin '500mg'$ x10d and Depo80 + Pred '20mg'$  tabs- tapering sched (see AVS);  OK to continue the [place], [place], & Mucinex as before;  We will arrange for the ENT appt.Jesse Santos   HBP>  On LOSARTAN-'50mg'$ /d & ATEN-25 w/ good control of BP, continue same...  R/O CAD>  Followed by DrHochrein, and doing satis w/ neg Myoview 6/15... continue ASA & risk factor reduction....  Lipids>  Looks satis on his diet + Niacin...  DM>  Control is fair on Metform500Bid w/ A1c stable up to 7.2;  We decided to add Glimep'2mg'$  Qam...  GI>  Per DrPerry w/ GERD, ?Gastroparesis, Divertics, IBS, Colon polyps> f/u colon 5/12 w/ divertics & 2 sm adenomas, repeat planned ~16yr...  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  Anxiety>  Certainly an issue, he does not want anxiolytic Rx...   Patient's Medications  New Prescriptions   LEVOFLOXACIN (LEVAQUIN) 500 MG TABLET    Take 1 tablet (500 mg total) by mouth daily.   PREDNISONE (DELTASONE) 20 MG TABLET    Take as directed  Previous Medications   ACETAMINOPHEN (TYLENOL) 500 MG TABLET    Take 500 mg by mouth every 6 (six) hours as needed for headache (pain).    ASPIRIN EC 81 MG TABLET    Take 81 mg by mouth at bedtime.   ATENOLOL (TENORMIN) 25  MG TABLET    Take 1 tablet (25 mg total) by mouth daily.   CHOLECALCIFEROL (VITAMIN D) 2000 UNITS CAPS    Take 2,000 Units by mouth daily.    CLOTRIMAZOLE (MYCELEX) 10 MG TROCHE    Take 10 mg by mouth daily.    COMPRO 25 MG SUPPOSITORY    PLACE 1 SUPPOSITORY (25 MG TOTAL) RECTALLY EVERY 12 (TWELVE) HOURS AS NEEDED FOR NAUSEA.   DEXTROMETHORPHAN-GUAIFENESIN (MUCINEX DM) 30-600 MG PER 12 HR TABLET    Take 1 tablet by mouth every 12 (twelve) hours.   FLUTICASONE (FLONASE) 50 MCG/ACT NASAL SPRAY    Place 2 sprays into both nostrils daily.   FLUTICASONE-SALMETEROL (ADVAIR HFA) 115-21 MCG/ACT INHALER    Inhale 1 puff into the lungs 2 (two) times daily.   FOLIC ACID (FOLVITE) 1 MG TABLET    Take 1 mg by mouth daily with lunch.    GLIMEPIRIDE (AMARYL) 2 MG TABLET    Take 1 tablet (2 mg total) by mouth daily with breakfast.   HYOSCYAMINE (LEVSIN SL) 0.125 MG SL TABLET    Place 1 tablet (0.125 mg total) under the tongue every 4 (four) hours as needed. As needed for abdominal cramping   LIDOCAINE (ASPERCREME W/LIDOCAINE) 4 % CREAM    Apply 1 application topically daily as needed (pain).    LORATADINE (CLARITIN) 10 MG TABLET    Take 10 mg by mouth daily as needed for allergies.    LOSARTAN (COZAAR) 50 MG TABLET    Take 1 tablet (50 mg total) by mouth daily.   METFORMIN (GLUCOPHAGE) 500 MG TABLET    Take 1 tablet (500 mg total) by mouth 2 (two) times daily with a meal.   METHOTREXATE (RHEUMATREX) 2.5 MG TABLET    Take 3 tablets (7.5 mg total) by mouth See admin instructions. Take 3 tablets (7.5 mg) by mouth on Tuesdays  with lunch, starting 11/24/2016   MULTIPLE VITAMINS-MINERALS (MULTIVITAMIN WITH MINERALS) TABLET    Take 1 tablet by mouth daily.     NIACIN (NIASPAN) 500 MG CR TABLET    Take 1 tablet (500 mg total) by mouth at bedtime.   NITROGLYCERIN (NITROSTAT) 0.4 MG SL TABLET    Place 1 tablet (0.4 mg total) under the tongue every 5 (five) minutes as needed for chest pain.   ONE TOUCH ULTRA TEST TEST STRIP     TEST BLOOD SUGAR ONCE A DAY AS DIRECTED (DX E11.9)   PANTOPRAZOLE (PROTONIX) 40 MG TABLET    Take 1 tablet (40 mg total) by mouth daily.   POLYETHYLENE GLYCOL POWDER (GLYCOLAX/MIRALAX) POWDER    TAKE 17 GRAMS BY MOUTH TWICE DAILY AS NEEDED   PROBIOTIC PRODUCT (PROBIOTIC PO)    Take 1 tablet by mouth daily.   SODIUM CHLORIDE (OCEAN) 0.65 % NASAL SPRAY    Place 1 spray into the nose 3 (three) times daily as needed for congestion.     TACROLIMUS (PROGRAF) 1 MG CAPSULE    Take 1 mg by mouth See admin instructions. Dissolve 1 capsule (1 mg) in 500 ml of water and keep in refrigerator - swish and spit small amount twice daily   TAMSULOSIN (FLOMAX) 0.4 MG CAPS CAPSULE    Take 1 capsule (0.4 mg total) by mouth 2 (two) times daily.   TRAMADOL (ULTRAM) 50 MG TABLET    TAKE ONE TABLET BY MOUTH THREE TIMES A DAY AS NEEDED FOR PAIN  Modified Medications   No medications on file  Discontinued Medications   FLUCONAZOLE (DIFLUCAN) 100 MG TABLET    Take 100 mg by mouth daily. One a day for 5 days and 1 a week for 5 weeks.   METRONIDAZOLE (FLAGYL) 250 MG TABLET    Take 1 tablet (250 mg total) by mouth 3 (three) times daily.   PREDNISONE (DELTASONE) 10 MG TABLET    TAKE TWO TABLETS BY MOUTH EVERY MORNING. WEAN OFF AS SYMPTOMS RESOLVE

## 2017-12-09 NOTE — Telephone Encounter (Signed)
error 

## 2017-12-20 DIAGNOSIS — L439 Lichen planus, unspecified: Secondary | ICD-10-CM | POA: Diagnosis not present

## 2017-12-20 DIAGNOSIS — J0141 Acute recurrent pansinusitis: Secondary | ICD-10-CM | POA: Diagnosis not present

## 2017-12-20 DIAGNOSIS — J342 Deviated nasal septum: Secondary | ICD-10-CM | POA: Diagnosis not present

## 2017-12-20 DIAGNOSIS — J302 Other seasonal allergic rhinitis: Secondary | ICD-10-CM | POA: Diagnosis not present

## 2018-02-17 DIAGNOSIS — L570 Actinic keratosis: Secondary | ICD-10-CM | POA: Diagnosis not present

## 2018-02-17 DIAGNOSIS — D1801 Hemangioma of skin and subcutaneous tissue: Secondary | ICD-10-CM | POA: Diagnosis not present

## 2018-02-17 DIAGNOSIS — D229 Melanocytic nevi, unspecified: Secondary | ICD-10-CM | POA: Diagnosis not present

## 2018-02-17 DIAGNOSIS — C44612 Basal cell carcinoma of skin of right upper limb, including shoulder: Secondary | ICD-10-CM | POA: Diagnosis not present

## 2018-02-17 DIAGNOSIS — L814 Other melanin hyperpigmentation: Secondary | ICD-10-CM | POA: Diagnosis not present

## 2018-02-17 DIAGNOSIS — D485 Neoplasm of uncertain behavior of skin: Secondary | ICD-10-CM | POA: Diagnosis not present

## 2018-02-17 DIAGNOSIS — Z85828 Personal history of other malignant neoplasm of skin: Secondary | ICD-10-CM | POA: Diagnosis not present

## 2018-02-17 DIAGNOSIS — L821 Other seborrheic keratosis: Secondary | ICD-10-CM | POA: Diagnosis not present

## 2018-02-23 DIAGNOSIS — C44612 Basal cell carcinoma of skin of right upper limb, including shoulder: Secondary | ICD-10-CM | POA: Diagnosis not present

## 2018-02-27 ENCOUNTER — Encounter (HOSPITAL_BASED_OUTPATIENT_CLINIC_OR_DEPARTMENT_OTHER): Payer: Self-pay | Admitting: Emergency Medicine

## 2018-02-27 ENCOUNTER — Emergency Department (HOSPITAL_BASED_OUTPATIENT_CLINIC_OR_DEPARTMENT_OTHER): Payer: Medicare Other

## 2018-02-27 ENCOUNTER — Emergency Department (HOSPITAL_BASED_OUTPATIENT_CLINIC_OR_DEPARTMENT_OTHER)
Admission: EM | Admit: 2018-02-27 | Discharge: 2018-02-27 | Disposition: A | Discharge status: Home / Self Care | Payer: Medicare Other | Attending: Emergency Medicine | Admitting: Emergency Medicine

## 2018-02-27 ENCOUNTER — Other Ambulatory Visit: Payer: Self-pay

## 2018-02-27 DIAGNOSIS — Z7982 Long term (current) use of aspirin: Secondary | ICD-10-CM | POA: Diagnosis not present

## 2018-02-27 DIAGNOSIS — Z7984 Long term (current) use of oral hypoglycemic drugs: Secondary | ICD-10-CM | POA: Diagnosis not present

## 2018-02-27 DIAGNOSIS — Z87891 Personal history of nicotine dependence: Secondary | ICD-10-CM | POA: Diagnosis not present

## 2018-02-27 DIAGNOSIS — I2510 Atherosclerotic heart disease of native coronary artery without angina pectoris: Secondary | ICD-10-CM | POA: Insufficient documentation

## 2018-02-27 DIAGNOSIS — J449 Chronic obstructive pulmonary disease, unspecified: Secondary | ICD-10-CM | POA: Diagnosis not present

## 2018-02-27 DIAGNOSIS — R531 Weakness: Secondary | ICD-10-CM | POA: Diagnosis not present

## 2018-02-27 DIAGNOSIS — E119 Type 2 diabetes mellitus without complications: Secondary | ICD-10-CM | POA: Insufficient documentation

## 2018-02-27 LAB — COMPREHENSIVE METABOLIC PANEL
ALK PHOS: 89 U/L (ref 38–126)
ALT: 17 U/L (ref 17–63)
AST: 26 U/L (ref 15–41)
Albumin: 4 g/dL (ref 3.5–5.0)
Anion gap: 9 (ref 5–15)
BUN: 16 mg/dL (ref 6–20)
CALCIUM: 9.3 mg/dL (ref 8.9–10.3)
CO2: 23 mmol/L (ref 22–32)
CREATININE: 0.88 mg/dL (ref 0.61–1.24)
Chloride: 108 mmol/L (ref 101–111)
Glucose, Bld: 124 mg/dL — ABNORMAL HIGH (ref 65–99)
Potassium: 4.2 mmol/L (ref 3.5–5.1)
Sodium: 140 mmol/L (ref 135–145)
Total Bilirubin: 0.2 mg/dL — ABNORMAL LOW (ref 0.3–1.2)
Total Protein: 7 g/dL (ref 6.5–8.1)

## 2018-02-27 LAB — CBC
HEMATOCRIT: 38.9 % — AB (ref 39.0–52.0)
HEMOGLOBIN: 12.8 g/dL — AB (ref 13.0–17.0)
MCH: 29.7 pg (ref 26.0–34.0)
MCHC: 32.9 g/dL (ref 30.0–36.0)
MCV: 90.3 fL (ref 78.0–100.0)
Platelets: 173 10*3/uL (ref 150–400)
RBC: 4.31 MIL/uL (ref 4.22–5.81)
RDW: 14.1 % (ref 11.5–15.5)
WBC: 4.5 10*3/uL (ref 4.0–10.5)

## 2018-02-27 LAB — URINALYSIS, ROUTINE W REFLEX MICROSCOPIC
Bilirubin Urine: NEGATIVE
GLUCOSE, UA: NEGATIVE mg/dL
HGB URINE DIPSTICK: NEGATIVE
Ketones, ur: NEGATIVE mg/dL
Leukocytes, UA: NEGATIVE
Nitrite: NEGATIVE
Protein, ur: NEGATIVE mg/dL
pH: 5.5 (ref 5.0–8.0)

## 2018-02-27 LAB — CK: Total CK: 75 U/L (ref 49–397)

## 2018-02-27 LAB — VITAMIN B12: Vitamin B-12: 474 pg/mL (ref 180–914)

## 2018-02-27 LAB — FOLATE: FOLATE: 46 ng/mL (ref >5.9)

## 2018-02-27 NOTE — ED Provider Notes (Signed)
[facility] EMERGENCY DEPARTMENT Provider Note   CSN: [id number] Arrival date & time: 02/27/18  1115     History   Chief Complaint Chief Complaint  Patient presents with  . Weakness    HPI Jesse Santos is a 82 y.o. male.  Complaint is weakness  HPI: Very pleasant 82 year old male.  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  Times about 2 weeks of feeling weakness.  States is more in his legs but is generalized.  He has not been ill otherwise.  He fell 2 weeks ago and states he is just not felt right since then.  He did not syncope.  He has not had any episodes of pain.  His tongue is still bothersome to him.  He was taking methotrexate 0.5 mg 1 day/week.  His dermatologist change this to 2.5 mg Tuesday, Wednesday, Thursday.  His symptoms have not improved.  No back pain or bowel or bladder symptoms.  No upper extremity symptoms currently.  No headache.  No stroke symptoms.  Past Medical History:  Diagnosis Date  . Allergic rhinitis, cause unspecified   . Anemia   . Anxiety   . Benign neoplasm of colon   . BPH (benign prostatic hypertrophy)   . COPD (chronic obstructive pulmonary disease) ([place])   . Coronary artery disease    Coronary calcification with negative ETT 2010;   ETT-Myoview (02/2014):  Normal stress nuclear study. No evidence of ischemia.  LV Ejection Fraction: 69%  . Degenerative joint disease   . Diverticulosis of colon (without mention of hemorrhage)   . Esophageal reflux   . Hearing loss   . Hyperplastic colon polyp 02/12/2011  . Irritable bowel syndrome   . Lumbar back pain   . Mixed hyperlipidemia   . Obstructive sleep apnea (adult) (pediatric)   . Oral lichen planus   . Renal calculus   . Type II or unspecified type diabetes mellitus without mention of complication, not stated as uncontrolled   . Unspecified essential hypertension     Patient Active Problem List   Diagnosis Date Noted  . Aortic  atherosclerosis ([place]) 11/19/2017  . High coronary artery calcium score 11/19/2017  . Tobacco use 11/19/2017  . Recurrent sinusitis 05/27/2017  . History of pneumonia 05/27/2017  . Unilateral primary osteoarthritis, right hip 05/13/2017  . Other intervertebral disc degeneration, lumbar region 05/13/2017  . Pain in right hip 05/13/2017  . Hemoptysis 11/13/2016  . Lichen planus 85/46/2703  . Type 2 diabetes mellitus without complication, without long-term current use of insulin ([place]) 10/15/2015  . COPD (chronic obstructive pulmonary disease) with chronic bronchitis ([place]) 04/10/2015  . History of colonic polyps 12/28/2014  . Obstruction to urinary outflow 01/02/2011  . Overweight(278.02) 11/11/2009  . GASTROPARESIS 04/15/2009  . CONSTIPATION 04/15/2009  . CORONARY ATHEROSCLEROSIS NATIVE CORONARY ARTERY 02/15/2009  . GASTROENTERITIS 01/01/2009  . Benign prostatic hyperplasia with urinary obstruction 07/29/2008  . MIXED HYPERLIPIDEMIA 07/10/2008  . Osteoarthritis 07/10/2008  . HEARING LOSS 01/11/2008  . Diverticulosis of large intestine 01/11/2008  . COLONIC POLYPS 07/22/2007  . ANEMIA 07/22/2007  . Anxiety state 07/22/2007  . Obstructive sleep apnea 07/22/2007  . Essential hypertension 07/22/2007  . Allergic rhinitis 07/22/2007  . GERD 07/22/2007  . IRRITABLE BOWEL SYNDROME 07/22/2007  . BACK PAIN, LUMBAR 07/22/2007  . RENAL CALCULUS, HX OF 07/22/2007    Past Surgical History:  Procedure Laterality Date  . CATARACT EXTRACTION    . CHOLECYSTECTOMY    . KNEE SURGERY  11/10  . SKIN CANCER EXCISION  3/10   left hand         Home Medications    Prior to Admission medications   Medication Sig Start Date End Date Taking? Authorizing Provider  acetaminophen (TYLENOL) 500 MG tablet Take 500 mg by mouth every 6 (six) hours as needed for headache (pain).     [provider]  aspirin EC 81 MG tablet Take 81 mg by mouth at bedtime.    [provider]  atenolol  (TENORMIN) 25 MG tablet Take 1 tablet (25 mg total) by mouth daily. 04/14/17   Noralee Space, MD  Cholecalciferol (VITAMIN D) 2000 UNITS CAPS Take 2,000 Units by mouth daily.     [provider]  clotrimazole (MYCELEX) 10 MG troche Take 10 mg by mouth daily.  04/01/16   [provider]  COMPRO 25 MG suppository PLACE 1 SUPPOSITORY (25 MG TOTAL) RECTALLY EVERY 12 (TWELVE) HOURS AS NEEDED FOR NAUSEA. 03/07/12   Irene Shipper, MD  dextromethorphan-guaiFENesin [facility] DM) 30-600 MG per 12 hr tablet Take 1 tablet by mouth every 12 (twelve) hours.    [provider]  fluticasone (FLONASE) 50 MCG/ACT nasal spray Place 2 sprays into both nostrils daily. 04/14/17   Noralee Space, MD  fluticasone-salmeterol (ADVAIR HFA) 086-57 MCG/ACT inhaler Inhale 1 puff into the lungs 2 (two) times daily. 04/14/17   Noralee Space, MD  folic acid (FOLVITE) 1 MG tablet Take 1 mg by mouth daily with lunch.  07/21/16   [provider]  glimepiride (AMARYL) 2 MG tablet Take 1 tablet (2 mg total) by mouth daily with breakfast. 10/19/17   Noralee Space, MD  hyoscyamine (LEVSIN SL) 0.125 MG SL tablet Place 1 tablet (0.125 mg total) under the tongue every 4 (four) hours as needed. As needed for abdominal cramping 10/18/17   Noralee Space, MD  levofloxacin (LEVAQUIN) 500 MG tablet Take 1 tablet (500 mg total) by mouth daily. 12/09/17   Noralee Space, MD  lidocaine (ASPERCREME W/LIDOCAINE) 4 % cream Apply 1 application topically daily as needed (pain).     [provider]  loratadine (CLARITIN) 10 MG tablet Take 10 mg by mouth daily as needed for allergies.     [provider]  losartan (COZAAR) 50 MG tablet Take 1 tablet (50 mg total) by mouth daily. 04/14/17   Noralee Space, MD  metFORMIN (GLUCOPHAGE) 500 MG tablet Take 1 tablet (500 mg total) by mouth 2 (two) times daily with a meal. 04/14/17   Noralee Space, MD  methotrexate (RHEUMATREX) 2.5 MG tablet Take 3 tablets (7.5 mg total) by  mouth See admin instructions. Take 3 tablets (7.5 mg) by mouth on Tuesdays with lunch, starting 11/24/2016 11/15/16   Lavina Hamman, MD  Multiple Vitamins-Minerals (MULTIVITAMIN WITH MINERALS) tablet Take 1 tablet by mouth daily.      [provider]  niacin (NIASPAN) 500 MG CR tablet Take 1 tablet (500 mg total) by mouth at bedtime. 04/14/17   Noralee Space, MD  nitroGLYCERIN (NITROSTAT) 0.4 MG SL tablet Place 1 tablet (0.4 mg total) under the tongue every 5 (five) minutes as needed for chest pain. Patient taking differently: Place 0.4 mg under the tongue every 5 (five) minutes as needed for chest pain.  01/08/14   Minus Breeding, MD  ONE TOUCH ULTRA TEST test strip TEST BLOOD SUGAR ONCE A DAY AS DIRECTED (DX E11.9) 02/22/17   Noralee Space, MD  pantoprazole (PROTONIX) 40 MG tablet Take 1 tablet (40 mg total) by mouth daily. 04/14/17   Noralee Space, MD  polyethylene glycol powder (GLYCOLAX/MIRALAX) powder TAKE 17 GRAMS BY MOUTH TWICE DAILY AS NEEDED 10/18/17   Noralee Space, MD  predniSONE (DELTASONE) 20 MG tablet Take as directed 12/09/17   Noralee Space, MD  Probiotic Product (PROBIOTIC PO) Take 1 tablet by mouth daily.    [provider]  sodium chloride (OCEAN) 0.65 % nasal spray Place 1 spray into the nose 3 (three) times daily as needed for congestion.      [provider]  tacrolimus (PROGRAF) 1 MG capsule Take 1 mg by mouth See admin instructions. Dissolve 1 capsule (1 mg) in 500 ml of water and keep in refrigerator - swish and spit small amount twice daily 04/01/16   [provider]  tamsulosin (FLOMAX) 0.4 MG CAPS capsule Take 1 capsule (0.4 mg total) by mouth 2 (two) times daily. 04/14/17   Noralee Space, MD  traMADol Veatrice Bourbon) 50 MG tablet TAKE ONE TABLET BY MOUTH THREE TIMES A DAY AS NEEDED FOR PAIN 05/12/17   Noralee Space, MD    Family History Family History  Problem Relation Age of Onset  . Heart disease Father 8  . Hypertension Brother   . Stroke  Mother 67  . Diabetes Mother   . Colon cancer Neg Hx     Social History Social History   Tobacco Use  . Smoking status: Former Smoker    Packs/day: 1.00    Years: 25.00    Pack years: 25.00    Types: Cigarettes    Last attempt to quit: 09/15/1979    Years since quitting: 38.4  . Smokeless tobacco: Former Systems developer    Quit date: 09/15/1979  Substance Use Topics  . Alcohol use: No    Alcohol/week: 0.0 oz  . Drug use: No     Allergies   Prednisone   Review of Systems Review of Systems  Constitutional: Positive for appetite change. Negative for chills, diaphoresis, fatigue and fever.  HENT: Negative for mouth sores, sore throat and trouble swallowing.   Eyes: Negative for visual disturbance.  Respiratory: Negative for cough, chest tightness, shortness of breath and wheezing.   Cardiovascular: Negative for chest pain.  Gastrointestinal: Negative for abdominal distention, abdominal pain, diarrhea, nausea and vomiting.  Endocrine: Negative for polydipsia, polyphagia and polyuria.  Genitourinary: Negative for dysuria, frequency and hematuria.  Musculoskeletal: Negative for gait problem.  Skin: Negative for color change, pallor and rash.  Neurological: Positive for weakness. Negative for dizziness, syncope, light-headedness and headaches.  Hematological: Does not bruise/bleed easily.  Psychiatric/Behavioral: Negative for behavioral problems and confusion.     Physical Exam Updated Vital Signs BP 139/76   Pulse 71   Temp 98 F (36.7 C) (Oral)   Resp (!) 22   Ht 6\' 1"  (1.854 m)   Wt 96.6 kg (213 lb)   SpO2 100%   BMI 28.10 kg/m   Physical Exam  Constitutional: He is oriented to person, place, and time. He appears well-developed and well-nourished. No distress.  82 year old awake and alert.  Vigorous.  Interactive conversant.  No distress.  HENT:  Head: Normocephalic.  Eyes: Pupils are equal, round, and reactive to light. Conjunctivae are normal. No scleral icterus.    Neck: Normal range of motion. Neck supple. No thyromegaly present.  Cardiovascular: Normal rate and regular rhythm. Exam reveals no gallop and no friction rub.  No murmur heard.  Pulmonary/Chest: Effort normal and breath sounds normal. No respiratory distress. He has no wheezes. He has no rales.  Abdominal: Soft. Bowel sounds are normal. He exhibits no distension. There is no tenderness. There is no rebound.  Musculoskeletal: Normal range of motion.  Neurological: He is alert and oriented to person, place, and time.  Normal DTRs 0-1+ Achilles and knee jerk.  These do augment with handgrips.  Downgoing Babinski.  No clonus.  He can hold each leg up in the air dependently for 10 seconds and against resistance.  Skin: Skin is warm and dry. No rash noted.  Psychiatric: He has a normal mood and affect. His behavior is normal.     ED Treatments / Results  Labs (all labs ordered are listed, but only abnormal results are displayed) Labs Reviewed  CBC - Abnormal; Notable for the following components:      Result Value   Hemoglobin 12.8 (*)    HCT 38.9 (*)    All other components within normal limits  URINALYSIS, ROUTINE W REFLEX MICROSCOPIC - Abnormal; Notable for the following components:   Specific Gravity, Urine >1.030 (*)    All other components within normal limits  COMPREHENSIVE METABOLIC PANEL - Abnormal; Notable for the following components:   Glucose, Bld 124 (*)    Total Bilirubin 0.2 (*)    All other components within normal limits  CK  FOLATE  VITAMIN B12    EKG None  Radiology Dg Chest 2 View  Result Date: 02/27/2018 CLINICAL DATA:  Weakness for few days.  History of COPD. EXAM: CHEST - 2 VIEW COMPARISON:  PA and lateral chest 05/27/2017 and 12/09/2016. FINDINGS: The lungs are clear. Heart size is normal. No pneumothorax or pleural effusion. Aortic atherosclerosis noted. No acute or focal bony abnormality. IMPRESSION: No acute disease. Atherosclerosis. Electronically  Signed   By: Inge Rise M.D.   On: 02/27/2018 12:38    Procedures Procedures (including critical care time)  Medications Ordered in ED Medications - No data to display   Initial Impression / Assessment and Plan / ED Course  I have reviewed the triage vital signs and the nursing notes.  Pertinent labs & imaging results that were available during my care of the patient were reviewed by me and considered in my medical decision making (see chart for details).    Not appear pale or anemic.  His normal chest x-ray, EKG and BNP other than glucose slightly high at 124.  Not concerned that this is Engineer, water.  Over 2 weeks it is not progressed.  He does not have lower extremity weakness or loss of reflexes he can heel stand and toe stand.  Of asked him to discuss his methotrexate with his physician.  He is taking folic acid and is compliant.  He does not have anemia, or macrocytosis.  B12 and folate levels were both sent.  Hopefully will do well.  Recheck with worsening.  Encourage follow-up as soon as possible  Final Clinical Impressions(s) / ED Diagnoses   Final diagnoses:  Weakness    ED Discharge Orders    None       Tanna Furry, MD 02/27/18 1556

## 2018-02-27 NOTE — Discharge Instructions (Addendum)
Contact your dermatologist tomorrow.  Inquire as to whether he thinks your methotrexate may be related to your weakness. No abnormalities were found on your testing today

## 2018-02-27 NOTE — ED Triage Notes (Addendum)
Generalized weakness x 2 weeks. Currently taking chemo. Denies chest pain or SOB. Pt states he fell 2 weeks ago and the weakness has been getting worse since then.

## 2018-03-03 DIAGNOSIS — Z85828 Personal history of other malignant neoplasm of skin: Secondary | ICD-10-CM | POA: Diagnosis not present

## 2018-03-03 DIAGNOSIS — L570 Actinic keratosis: Secondary | ICD-10-CM | POA: Diagnosis not present

## 2018-03-08 DIAGNOSIS — Z79899 Other long term (current) drug therapy: Secondary | ICD-10-CM | POA: Diagnosis not present

## 2018-03-08 DIAGNOSIS — L438 Other lichen planus: Secondary | ICD-10-CM | POA: Diagnosis not present

## 2018-03-08 DIAGNOSIS — Z7952 Long term (current) use of systemic steroids: Secondary | ICD-10-CM | POA: Diagnosis not present

## 2018-03-08 DIAGNOSIS — B370 Candidal stomatitis: Secondary | ICD-10-CM | POA: Diagnosis not present

## 2018-03-08 DIAGNOSIS — Z5181 Encounter for therapeutic drug level monitoring: Secondary | ICD-10-CM | POA: Diagnosis not present

## 2018-04-10 ENCOUNTER — Other Ambulatory Visit: Payer: Self-pay | Admitting: Pulmonary Disease

## 2018-04-11 ENCOUNTER — Telehealth: Payer: Self-pay | Admitting: Pulmonary Disease

## 2018-04-11 MED ORDER — GLUCOSE BLOOD VI STRP
ORAL_STRIP | 2 refills | Status: DC
Start: 2018-04-11 — End: 2018-07-25

## 2018-04-11 NOTE — Telephone Encounter (Signed)
Resent pt's one touch test stript Rx to [facility] and placed the dx code on the Rx.  Called [facility] but when called, the pharmacy was currently at lunch. Left a detailed message stating to them that a new Rx had been sent in for pt.  Nothing further needed.

## 2018-04-18 ENCOUNTER — Ambulatory Visit: Payer: Medicare Other | Admitting: Pulmonary Disease

## 2018-04-21 ENCOUNTER — Other Ambulatory Visit (INDEPENDENT_AMBULATORY_CARE_PROVIDER_SITE_OTHER): Payer: Medicare Other

## 2018-04-21 ENCOUNTER — Ambulatory Visit (INDEPENDENT_AMBULATORY_CARE_PROVIDER_SITE_OTHER): Payer: Medicare Other | Admitting: Pulmonary Disease

## 2018-04-21 ENCOUNTER — Encounter: Payer: Self-pay | Admitting: Pulmonary Disease

## 2018-04-21 VITALS — BP 132/68 | HR 60 | Temp 97.7°F | Ht 73.0 in | Wt 217.8 lb

## 2018-04-21 DIAGNOSIS — F411 Generalized anxiety disorder: Secondary | ICD-10-CM | POA: Diagnosis not present

## 2018-04-21 DIAGNOSIS — I2510 Atherosclerotic heart disease of native coronary artery without angina pectoris: Secondary | ICD-10-CM

## 2018-04-21 DIAGNOSIS — J189 Pneumonia, unspecified organism: Secondary | ICD-10-CM

## 2018-04-21 DIAGNOSIS — J181 Lobar pneumonia, unspecified organism: Secondary | ICD-10-CM

## 2018-04-21 DIAGNOSIS — L439 Lichen planus, unspecified: Secondary | ICD-10-CM | POA: Diagnosis not present

## 2018-04-21 DIAGNOSIS — E782 Mixed hyperlipidemia: Secondary | ICD-10-CM

## 2018-04-21 DIAGNOSIS — J449 Chronic obstructive pulmonary disease, unspecified: Secondary | ICD-10-CM

## 2018-04-21 DIAGNOSIS — Z8701 Personal history of pneumonia (recurrent): Secondary | ICD-10-CM

## 2018-04-21 DIAGNOSIS — E119 Type 2 diabetes mellitus without complications: Secondary | ICD-10-CM | POA: Diagnosis not present

## 2018-04-21 DIAGNOSIS — R931 Abnormal findings on diagnostic imaging of heart and coronary circulation: Secondary | ICD-10-CM | POA: Diagnosis not present

## 2018-04-21 DIAGNOSIS — K3184 Gastroparesis: Secondary | ICD-10-CM

## 2018-04-21 LAB — BASIC METABOLIC PANEL
BUN: 20 mg/dL (ref 6–23)
CALCIUM: 9.7 mg/dL (ref 8.4–10.5)
CHLORIDE: 106 meq/L (ref 96–112)
CO2: 27 meq/L (ref 19–32)
Creatinine, Ser: 0.86 mg/dL (ref 0.40–1.50)
GFR: 90.81 mL/min (ref >60.00)
Glucose, Bld: 133 mg/dL — ABNORMAL HIGH (ref 70–99)
Potassium: 4.1 mEq/L (ref 3.5–5.1)
SODIUM: 140 meq/L (ref 135–145)

## 2018-04-21 LAB — HEMOGLOBIN A1C: HEMOGLOBIN A1C: 6.8 % — AB (ref 4.6–6.5)

## 2018-04-21 MED ORDER — ATENOLOL 25 MG PO TABS: 25.0000 mg | ORAL_TABLET | Freq: Every day | ORAL | 3 refills | Status: DC

## 2018-04-21 MED ORDER — METFORMIN HCL 500 MG PO TABS: 500.0000 mg | ORAL_TABLET | Freq: Two times a day (BID) | ORAL | 3 refills | Status: DC

## 2018-04-21 MED ORDER — PANTOPRAZOLE SODIUM 40 MG PO TBEC: 40.0000 mg | DELAYED_RELEASE_TABLET | Freq: Every day | ORAL | 3 refills | Status: AC

## 2018-04-21 MED ORDER — NIACIN ER (ANTIHYPERLIPIDEMIC) 500 MG PO TBCR: 500.0000 mg | EXTENDED_RELEASE_TABLET | Freq: Every day | ORAL | 3 refills | Status: DC

## 2018-04-21 MED ORDER — LOSARTAN POTASSIUM 50 MG PO TABS: 50.0000 mg | ORAL_TABLET | Freq: Every day | ORAL | 3 refills | Status: DC

## 2018-04-21 MED ORDER — TRAMADOL HCL 50 MG PO TABS: ORAL_TABLET | ORAL | 3 refills | Status: AC

## 2018-04-21 MED ORDER — PROCHLORPERAZINE 25 MG RE SUPP: RECTAL | 0 refills | Status: AC

## 2018-04-21 MED ORDER — TAMSULOSIN HCL 0.4 MG PO CAPS: 0.4000 mg | ORAL_CAPSULE | Freq: Two times a day (BID) | ORAL | 3 refills | Status: DC

## 2018-04-21 MED ORDER — POLYETHYLENE GLYCOL 3350 17 GM/SCOOP PO POWD: ORAL | 5 refills | Status: DC

## 2018-04-21 MED ORDER — FLUTICASONE-SALMETEROL 115-21 MCG/ACT IN AERO: 1.0000 | INHALATION_SPRAY | Freq: Two times a day (BID) | RESPIRATORY_TRACT | 11 refills | Status: DC

## 2018-04-21 NOTE — Patient Instructions (Signed)
Today we updated your med list in our EPIC system...    Continue your current medications the same...  Today we checked your follow up DM labs...    We will contact you w/ the results when available...   We reviewed your recent history & support Dr. Yancey Flemings request for the new medication to treat your lichen planus...  In the meanwhile>  Stay as active as possible, keep your nutrition up w/ dietary supplements if it's difficult to eat...  Call for any questions...  Let's plan a follow up visit in 54mo, sooner if needed for problems.Marland KitchenMarland Kitchen

## 2018-04-22 ENCOUNTER — Encounter: Payer: Self-pay | Admitting: Pulmonary Disease

## 2018-04-22 NOTE — Progress Notes (Addendum)
Subjective:    Patient ID: Jesse Santos, male    DOB: 1935-09-29, 82 y.o.   MRN: [id number]  HPI 82 y/o WM w/ mult med problems here for a follow up visit>  ~  SEE PREV EPIC NOTES FOR OLDER DATA >>     LABS 1/14:  FLP- at goals on Niacin rx;  Chems- ok w/ BS=134, A1c=7.5;  CBC- wnl;  TSH=1.13;  VitD=42;  PSA=1.10      LABS 7/14:  Chems- ok x BS=140, A1c=7.3...   1/15> Jesse Santos reports that DrDuda plans a left THR (for avasc necreosis) whenever he is ready, but he's not rushing it, on Tramadol prn & doing satis for now...   LABS 1/15:  FLP- at goals on diet+Niaspan;  Chesm- ok w/ BS=129, A1c=7.0;  CBC- wnl;  TSH=1.06...  EKG 4/15 showed NSR, rate70, wnl, NAD...  Treadmill 6/15 (DrBrackbill) showed borderline ST segment changes w/ exercise & a run of atrial tachy reported...   Myoview 6/15 showed good exerc capacity, norm BP response, no CP, no evid ischemia, decr uptake inferiorly on rest images similar to 2013, EF=69%, norm wall motion...  LABS 7/15:  Chems- ok w/ BS=126, A1c=7.1.Marland KitchenMarland Kitchen rec to continue Metform500Bid & get on diet, incr exerc, get wt down!!!   ~  October 10, 2014:  59moROV & Jesse Santos indicates it took several wks to get over his URI/AB episode in Oct;      On Advair115-2spBid, Mucinex, flonase, claritin; he reports breathing is stable- min cough, no sput, stable DOE, no edema...     He remains on Niasp500/d; FLP 1/16 showed TChol 150, TG 101, HDL 45, LDL 85    He continues Metform500Bid for his DM; Labs 1/16 showed BS=133, A1c=7.2    He has some lower abd pain/ cramping & has used the Levsin w/ relief- c/w IBS, some diarrhea, nausea, groin discomfort; he had colonoscopy 2012 w/ 2 polyps removed & f/u planned 2017...     He sees DrDuda for Ortho & takes Tramadol50 aS NEEDED... We reviewed prob list, meds, xrays and labs> see below for updates >>   CXR 1/16 showed norm heart size, clear lungs, DJD in Tspine, NAD...  LABS 1/16:  FLP- at goals on Niasp;  Chems- ok w/ BS=133  A1c=7.2;  CBC- wnl;  TSH=1.59;  PSA=1.25...   ~  April 10, 2015:  645moOV & Jesse Santos reports stable w/ CC= arthritis pain in shoulders/ hips, he sees DrDuda, told he needs left THR but he is holding off, Tramadol helps;  We reviewed the following medical problems during today's office visit >>    HOH> he has bilat hearing aides...    AR> on allergy shots, OTC antihist, Saline, Flonase; gets occas bouts of sinusitis but overall improved & no recent infections reported...     OSA> Sleep Study was in 2/04 w/ RDI=45 & desat to 88% w/ mod snoring & leg jerks, but he states he never could use the CPAP effectively & states he sleeps fine now if he stays on his side; denies daytime sleepiness or other issues...    COPD> exsmoker quit 1981; on AdvairHFA115-2spBid, Mucinex prn; he doesn't like Pred Rx; he had Bronchitic exac treated 10/15 w/ Levaquin, Medrol, Mucinex, etc & improved; needs incr exercise program...    HBP> on Aten25, Losar100; BP=120/70, he denies CP, palpit, dizzy, SOB, edema, etc...    CAD> on ASA81; followed by DrHochrein- CT Abd 2010 showed coronary calcif, Treadmill was neg; Myoview 3/13 was neg,  wnl; last seen 4/15- no new symptoms, active w/ yard work, EKG showed wnl, they decided to do Treadmill (borderline ST depression), then Myoview 6/15 showed NEG- no ischemia, decr uptake inferiorly same as 2013, EF=69%, norm wall motion, low risk...    Hyperlipid> on Niacin'500mg'$ ; FLP 1/16 showed TChol 150, TG 101, HDL 45, LDL 85; continue same, get wt down...    DM> on Metform500Bid; wt=222#, BMI=29; Labs 7/16 showed BS=129, A1c=6.7; rec to continue same med, better diet, get wt down!..    GI- GERD, dysphagia, Divertics, IBS, Polyps> on Protonix40, Miralax, Levsin0.125 prn; followed by DrPerry & stable- last colon 5/12 w/ severe divertics & 2 sm adenomas removed...    GU- Kid stone, BPH> on Flomax0.4 & Cialis prn; PSA remains wnl (PSA 1/16= 1.25)...    DJD, LBP> left knee complaints w/ shots from DrDuda,  similar for left shoulder; hx bilat hip AVN noted on prev CT Abd but he has min complaints and uses Tylenol prn (holding off on surg)...    Anxiety> he does not want anxiolytic meds.[name] EXAM shows Afeb, VSS, O2sat=97%;  Heent- neg, mallampati2;  Chest- clear w/o w/r/r;  Heart- RR w/o m/r/g;  Abd- soft, neg, nontender;  Ext- neg w/o c/c/e;  Neuro- intact...  Labs 7/16>  Chems- ok w/ BS=129, A1c=6.7, Cr=0.83... IMP/PLAN>>  Jesse Santos is stable, meds refilled, he's already had the 2016 Flu vaccine & up to date;  We discussed diet/ exercise/ wt reduction... ROV in 80mo  ~  October 15, 2015:  620moOV & Jesse Santos saw TP 07/2015 for a lesion on tongue- sent to Derm DrLupton ?lichenoid mucositis (rx w/ several steroid shots & topical meds) & may need [facility] [employer]. He has mult somatic complaints> LBP, leg weakness, HAs, cough/ congestion/ clear mucous (yet he states exercise on treadmill for 3028mat 2.6mp29m.. We reviewed the following medical problems during today's office visit >>     HOH> he has bilat hearing aides...    AR> on allergy shots per DrVanWinkle, OTC antihist, Saline, Flonase; gets occas bouts of sinusitis but overall improved & no recent infections reported...     OSA> Sleep Study 2/04 w/ RDI=45 & desat to 88% w/ mod snoring & leg jerks, but he states he never could use the CPAP effectively & states he sleeps fine now if he stays on his side; denies daytime sleepiness or other issues...    COPD> exsmoker quit 1981; on AdvairHFA115-2spBid, Mucinex prn; he doesn't like Pred Rx; prev Bronchitic exac treated w/ Levaquin, Medrol, Mucinex & improved; needs incr exercise program...    HBP> on Aten25, Losar100; BP=126/70, he denies CP, palpit, dizzy, SOB, edema, etc...    CAD> on ASA81; followed by DrHochrein- CT Abd 2010 showed coronary calcif, Treadmill was neg; Myoview 3/13 was neg, wnl; last seen 4/15- no new symptoms, active w/ yard work, EKG showed wnl, they decided to do Treadmill (borderline ST  depression), then Myoview 6/15 showed NEG- no ischemia, decr uptake inferiorly same as 2013, EF=69%, norm wall motion, low risk...    Hyperlipid> on Niacin'500mg'$ ; FLP 1/17 showed TChol 132, TG 52, HDL 46, LDL 75; continue same, get wt down...    DM> on Metform500Bid; wt=220#, BMI=29; Labs 7/16 showed BS=129, A1c=6.7; rec to continue same med, better diet, get wt down; Labs 1/17 showed BS=134, A1c=6.8...  Marland KitchenMarland KitchenGI- GERD, dysphagia, Divertics, IBS, Polyps> on Protonix40, Miralax, Levsin0.125 prn; followed by DrPerry & stable- last colon 5/12 w/ severe divertics & 2 sm adenomas  removed...    GU- Kid stone, BPH> on Flomax0.4 & Cialis prn; PSA remains wnl (PSA 1/16= 1.25, 1/17=1.06)...    DJD, LBP> left knee complaints w/ shots from DrDuda, similar for left shoulder; hx bilat hip AVN noted on prev CT Abd but he has min complaints and uses Tylenol prn (holding off on surg)...    Anxiety> he does not want anxiolytic meds.[name] EXAM shows Afeb, VSS, O2sat=98%;  Heent- neg, mallampati2;  Chest- clear w/o w/r/r;  Heart- RR w/o m/r/g;  Abd- soft, neg, nontender;  Ext- neg w/o c/c/e;  Neuro- intact...  LABS 10/15/15>  FLP- at goals on diet + niacin;  Chems- ok x BS=134, A1c=6.8 on Metform500bid;  CBC- wnl;  TSH=1.08;  PSA=1.06 IMP/PLAN>>  Jesse Santos has mult somatic complaints but objectively in good shape; we discussed ENT 2nd opinion re tongue lesion vs Med Center referral per Derm, he will decide; continue present Rx & ROV 34mo..  ~  April 13, 2016:  667moOV and after the last visit Jesse Santos saw DrShoemaker, ENT 10/29/15 for the lesion on his tongue- his note is reviewed in Care Everywhere, he concurred w/ topical steroid therapy for this chronic tongue irritation & gave him Triamcinolone cream;  DrLupton [facility] 04[facility]01/16 7 note reviewed, bx proven lichenoid dermatitis, exam showed large erosions on lat border of tongue c/w oral erosive lichen planus; He rec Diflucan, Mycelex (for prob candida) &  Tacrolimus (Prograf) to dissolve in water & swish as directed=> pt indicates that he is improved, and they are planning f/u eval... we reviewed the following medical problems during today's office visit >>     HOH> he has bilat hearing aides & has been checked by DrShoemaker.    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    AR> on allergy shots per DrVanWinkle, OTC antihist, Saline, Flonase; gets occas bouts of sinusitis but overall improved & no recent infections reported...     OSA> Sleep Study 2/04 w/ RDI=45 & desat to 88% w/ mod snoring & leg jerks, but intol to CPAP & never used; states he sleeps fine now if he stays on his side; denies daytime sleepiness or other issues, naps 1/7...    COPD> exsmoker quit 1981; on AdvairHFA115-2spBid w/ spacer, Mucinex prn; he doesn't like Pred Rx; prev Bronchitic exac treated w/ antibiotics (Levaquin/ Augmentin), Medrol, Mucinex & improved; needs incr exercise program...    HBP> on Aten25, Losar100; BP=142/68, he denies CP, palpit, dizzy, SOB, edema, etc...    CAD> on ASA81; followed by DrHochrein- seen 12/19/15, CT Abd 2010 showed coronary calcif, Treadmill was neg (borderline ST depression); Myoview 3/13 was neg, wnl & Myoview 6/15 showed NEG- no ischemia, decr uptake inferiorly same as 2013, EF=69%, norm wall motion, low risk... He denies CP, palpit, dizzy, SOB, edema, etc; he remains active w/ yard work etc...    Hyperlipid> on Niacin50026mFLP 1/17 showed TChol 132, TG 52, HDL 46, LDL 75; continue same, get wt down...    DM> on Metform500Bid; wt=218#, BMI=29; Labs 7/16 showed BS=129, A1c=6.7; rec to continue same med, better diet, get wt down; Labs 1/17 showed BS=134, A1c=6.8 and A1c 04/13/16= 6.7, stable...    GI- GERD, dysphagia, Divertics, IBS, Polyps> on Protonix40, Miralax, Levsin0.125 prn; followed by DrPerry & stable- last colon 5/12 w/ severe divertics & 2 sm adenomas  removed...    GU- Kid stone, BPH> on Flomax0.4 &  Cialis prn; PSA remains wnl (PSA 1/16= 1.25, 1/17=1.06)...    DJD, LBP> left knee complaints w/ shots from DrDuda, similar for left shoulder; hx bilat hip AVN noted on prev CT Abd but he has min complaints and uses Tylenol prn (holding off on surg)...    Anxiety> he does not want anxiolytic meds.[name] EXAM shows Afeb, VSS, O2sat=98%;  Heent- neg, mallampati2;  Chest- clear w/o w/r/r;  Heart- RR w/o m/r/g;  Abd- soft, neg, nontender;  Ext- neg w/o c/c/e;  Neuro- intact...  LABS 04/13/16:  BMet- ordered but not done;  A1c=6.7.Marland KitchenMarland Kitchen IMP/PLAN>>  Jesse Santos is stable w/ his mult medical issues as above- oral lichen planus improved on regimen from Derm at [facility]; BP controlled, he remains active w/o angina, FLP & DM well regualted=> continue same meds.  ~  October 15, 2016:  41moROV & Jesse Santos was seen by SG 07/2016 & TP 08/2016 w/ cough prod of green mucus, sinus congestion, but no f/c/s; he was placed on MTX by DrJorizzo, [facility] due to his tongue lesion felt to be oral lichen planus, 2=> incr to 3 tabs once per week along w/ Folate, Mycelex and Prograf (1 cap dissolved in water & swished);  He remains on AdvairHFA-2spBid & he's careful to rinse after these treatments too;  He was treated w/ Levaquin + Pred taper and he followed up w/ TP 09/08/16-- improved w/ decr cough & dyspnea, no wheezing, remains on Advair, flonase, claritin, mucinex... we reviewed the following medical problems during today's office visit >>     HOH> he has bilat hearing aides & has been checked by DrShoemaker.    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    AR> on allergy shots per DrVanWinkle, OTC antihist, Saline, Flonase; gets occas bouts of sinusitis but overall improved & no recent infections reported...     OSA> Sleep Study 2/04 w/ RDI=45 & desat to 88% w/ mod  snoring & leg jerks, but intol to CPAP & never used; states he sleeps fine now if he stays on his side; denies daytime sleepiness or other issues, naps 1/7...    COPD> exsmoker quit 1981; on AdvairHFA115-2spBid w/ spacer, Mucinex prn; he doesn't like Pred Rx; prev Bronchitic exac treated w/ antibiotics (Levaquin/ Augmentin), Medrol, Mucinex & improved; needs incr exercise program...    HBP> on Aten25, Losar100; BP=116/60, he denies CP, palpit, dizzy, SOB, edema, etc...    CAD> on ASA81; followed by DrHochrein- seen 12/19/15, CT Abd 2010 showed coronary calcif, Treadmill was neg (borderline ST depression); Myoview 3/13 was neg, wnl & Myoview 6/15 showed NEG- no ischemia, decr uptake inferiorly same as 2013, EF=69%, norm wall motion, low risk... He denies CP, palpit, dizzy, SOB, edema, etc; he remains active w/ yard work etc...    Hyperlipid> on Niacin5039m FLP 1/17 showed TChol 132, TG 52, HDL 46, LDL 75; continue same, get wt down...    DM> on Metform500Bid; wt=216#, BMI=29; Labs 1/17 showed BS=134, A1c=6.8; rec to continue same med, better diet, get wt down; Labs 2/18 showed BS=136, A1c=6.7; stable...    GI- GERD, dysphagia, Divertics, IBS, Polyps> on Protonix40, Miralax, Levsin0.125 prn; followed by DrPerry & stable- last colon 5/12 w/ severe divertics & 2 sm adenomas removed; he wants off PPI=> change to PEPCID40...    GU- Kid stone, BPH> on Flomax0.4 & Cialis prn; PSA remains wnl (PSA 1/16= 1.25, 1/17=1.06, 2/18=1.45)...Marland KitchenMarland Kitchen  DJD, LBP> left knee complaints w/ shots from DrDuda, similar for left shoulder; hx bilat hip AVN noted on prev CT Abd but he has min complaints and uses Tylenol prn (holding off on surg)...    Anxiety> he does not want anxiolytic meds.[name] EXAM shows Afeb, VSS, O2sat=99%;  Wt=216#, 6'2"Tall, BMI=28;  Heent- neg, mallampati2;  Chest- clear w/o w/r/r;  Heart- RR w/o m/r/g;  Abd- soft, neg, nontender;  Ext- neg w/o c/c/e;  Neuro- intact...  CXR 08/10/16>  Norm heart size, atherosclerotic  Ao, clear lungs- NAD, degen changes in Tspine w/ some scoliosis...  LABS 10/15/16>  FLP- at goals on Niaspan;  Chems- ok w/ BS=136, A1c=6.7;  CBC- wnl w/ Hg=13.9, Fe=169 (42%sat), B12=504;  TSH=1.04,  PSA=1.45...  IMP/PLAN>>  Jesse Santos is still dealing w/ his oral prob= Lichen Planus & improved w/ treatment program under the direction of [facility]- on MTX 3/wk, Prograf, Mycelex;  His wife is concerned about his being on Protonix40 for many yrs, followed by DrPerry w/ GERD/ dysphagia, last EGD 2009 was essentially normal- OK to stop the PPI & change to Pepcid40;  Continue other meds the same + diet/ exercise...   ~  November 18, 2016:  38moROV & post hospital visit> DMarden Noblewas hospitalized 3/1 - 11/15/16 by [facility] w/ multilobar community acquired pneumonia (predom lingular w/ CT abn in LLL & RLL as well);  He woke w/ cough, malaise, myalgias, & chills;  Temp in ER was 103, BP was wnl, O2sat was 94% on 2L/min, CXR showed LLL pneumonia, no organism specified, treated w/ rocephin & zithromax=> disch on oral zithromax & medrol taper...     Currently feeling better- sl sore throat, cough, sm amt beige sput, no hemoptysis, +SOB/DOE & still "weak", some chest discomfort=> we decided to f/u CXR/ Labs (see below)...    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    AR> on allergy shots per DrVanWinkle, OTC antihist, Saline, Flonase; gets occas bouts of sinusitis but overall improved & no recent infections reported...     OSA> Sleep Study 2/04 w/ RDI=45 & desat to 88% w/ mod snoring & leg jerks, but intol to CPAP & never used; states he sleeps fine now if he stays on his side; denies daytime sleepiness or other issues, naps 1/7...    COPD> exsmoker quit 1981; on AdvairHFA115-2spBid w/ spacer, Mucinex prn; he doesn't like Pred Rx; prev Bronchitic exac treated w/ antibiotics (Levaquin/ Augmentin),  Medrol, Mucinex & improved; needs incr exercise program... EXAM shows Afeb, VSS, O2sat=99%;  Wt=216#, 6'2"Tall, BMI=28;  Heent- neg, mallampati2;  Chest- clear w/o w/r/r;  Heart- RR w/o m/r/g;  Abd- soft, neg, nontender;  Ext- neg w/o c/c/e;  Neuro- intact...  CXR 11/12/16 showed LLL pneumonia, borderline cardiomegaly, aortic calcif  CT Chest 11/13/16>  Neg for PE, multilobar left lung pneumonia, poss early RLL infiltrate, calcif aortic & coronary arteries...  CXR today 11/18/16 showed norm heart size, aortic atherosclerosis, left lung opacity is resolving/ improved, no effusion etc...  LABS 11/2016>  Chems- ok x BS 110-190;  CBC- ok x WBC 14K=>9K  LABS 11/18/16>  Chems- ok w/ BS=110;  CBC- improved w/ Hg=13.7, WBC=8.5 IMP/PLAN>>  DMarden Noblehas improved from his recent bout of pneumonia (no organism identified); he has finished the antibiotics (Zithromax), & weaning off the Medrol;  We plan ROV recheck in 3-4weeks...   ~  December 09, 2016:  3wk [place] is improved overall "just weak" he says;  BP is good at home on Losar50 reading 130-140/ 60-70 at home;  He denis much cough, sput, no hemoptysis, no CP, SOB improved & now active on his treadmill, doing housework, etc;  His CC is nasal drainage;  F/u CXR today is resolved- NAD...     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    AR> on allergy shots per DrVanWinkle, OTC antihist, Saline, Flonase; gets occas bouts of sinusitis but overall improved & no recent infections reported...     OSA> Sleep Study 2/04 w/ RDI=45 & desat to 88% w/ mod snoring & leg jerks, but intol to CPAP & never used; states he sleeps fine now if he stays on his side; denies daytime sleepiness or other issues, naps 1/7...    COPD> exsmoker quit 1981; on AdvairHFA115-2spBid w/ spacer, Mucinex prn; he doesn't like Pred Rx; prev Bronchitic exac treated w/  antibiotics (Levaquin/ Augmentin), Medrol, Mucinex & improved; needs incr exercise program...    Medical Issues>  HBP, CAD, HL, DM, Gastroparesis, BPH, DJD, anxiety... EXAM shows Afeb, VSS, O2sat=98%;  Wt=211#, 6'2"Tall, BMI=28;  Heent- neg, mallampati2;  Chest- clear w/o w/r/r;  Heart- RR w/o m/r/g;  Abd- soft, neg, nontender;  Ext- neg w/o c/c/e;  Neuro- intact...  CXR 12/09/16 (independently reviewed by me in the PACS system) showed norm heart size, clear lungs- NAD... IMP/PLAN>>  Jesse Santos's pneumonia has resolved & he is approaching his baseline "just weak" he says;  Rec to continue his baseline regimen, diet, exercise, etc...   ~  April 14, 2017:  58moROV & Jesse Santos reports that his Lichen Planus (tongue soreness) is improved and [facility]  He notes that his H2-blocker Pepcid isn't enough for his reflux & feels he needs to go back on Protonix40- ok (take it 360m before dinner);  Breathing is good but it is "dusty at my place" and this bothers his sinuses> prev on allergy shots for 22 yrs and DrESL stopped them when he retired, still uses ClNordstromdenies SOB, CP, etc...  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    AR> prev on allergy shots per DrESL, OTC antihist, Saline, Flonase; gets occas bouts of sinusitis but overall improved but he's concerned not doing as well off the shots- asked to check in w/ DrVanWinkle.    OSA> Sleep Study 2/04 w/ RDI=45 & desat to 88% w/ mod snoring & leg jerks, but intol to CPAP & never used; states he sleeps fine now if he stays on his side; denies daytime sleepiness or other issues, naps 1/7...    COPD> exsmoker quit 1981; on AdvairHFA115-2spBid w/ spacer, Mucinex prn; he doesn't like Pred Rx; prev Bronchitic exac  treated w/ antibiotics (Levaquin/ Augmentin), Medrol, Mucinex & improved; needs incr exercise program...    Medical Issues>  HBP (controlled), CAD (DrHochrein last 4/17), HL (on Niaspan, FLP ok 2/18), DM (on Metform, Labs 2/18 w/ BS-167 A1c-6.7) , Gastroparesis, BPH, DJD, anxiety... EXAM shows Afeb, VSS, O2sat=99%;  Wt=215#, 6'2"Tall, BMI=28;  Heent- neg, mallampati2;  Chest- clear w/o w/r/r;  Heart- RR w/o m/r/g;  Abd- soft, neg, nontender;  Ext- neg w/o c/c/e;  Neuro- intact... IMP/PLAN>>  OK back on Protonix40 taken 21mn before dinner;  Continue same meds and check back w/ DrVanWinkle regarding allergy shots;  OK- refills today & we reviewed diet & exercise...  ~  May 27, 2017:  6wk RLatimerhas seen Ortho-DrDuda for back pain & right hip pain, XRays showed arthritis; Tramadol provided some temporary relief, they decided to try a course of Pred w/ rov to assess for MRI & need for surg... Pt called last wk stating that he's seen DrVanWinkle w/ sinus symptoms and was given Cefdinir x10d but he reports only min better; also on the Pred from DrDuda; still c/o sinus congestion, pressure, light yellow mucus draining, still using Mucinex, Claritin, Flonase & now reporting chest tightness & some wheezing; he states "I can't clear my sinuses", denies f/c/s but has drainage/ blowing/ coughing...    EXAM shows Afeb, VSS, O2sat=97%;  Wt=215#;  Heent- +cerumen impactions, +sinus congestion, mallampati2;  Chest- clear w/o w/r/r;  Heart- RR w/o m/r/g;  Abd- soft, neg, nontender;  Ext- neg w/o c/c/e;  Neuro- intact..  CXR 05/27/17 (independently reviewed by me in the PACS system)> norm heart size, sl pleural thickening, sl incr markings- NAD...  LABS 05/27/17 showed Chems- wnl x BS=171, CBC- Hg=13.1, WBC=11K;  Sed=16...  Sputum C&S 05/27/17> NEG- NTF only IMP/PLAN>>  DMarden Noblehas had a long hx recurrent sinusitis & allergies [facility]; now off shots & having more  difficulty; he has seen DrVanWinkle in follow up;  We discussed Rx w/ Levaquin '750mg'$ /d x10d + Align & Activia;  Given Depo120 & continue Pred taper from DrDuda; continue Mucinex, Flonase, Claritin, [place] and f/u w/ ENT DrShoemaker for cerumen/ hearing aides/ sinusitis...  ADDENDUM>>  Sput from 05/27/17 was also set up for Fungal smear&culture> lab now reports smear pos for fungal elements, and cult shows Aspergillus flavus, Aspergillus fumigatus, Cryptococcus laurentii, & Bipolaris species;  He is on chr therapy for [facility] on MTX (3/wk), Prograf (oral swish), Mycelex (troches), & Folate;  He has had mult sinus infections treated periodically w/ Levaquin, Augmentin, etc;  He also gets freq courses for Pred [place] and from Ortho for back & right hip pain... I do not feel that these 4 agents are causing dis in his sinuses or lungs- suspect oral contam?  Plan is to try a course of MMW & repeat Sputum Fungal culture=> discussed w/ pt.  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   ~  October 18, 2017:  4-584moOV & after last OV pt saw ID-DrCampbell on 07/13/17>  2 recent sput cultures + for various fungi on cultures; on immunosuppressive rx for lichen planus; however there was no evid for active fungal infection- no pneumonia, sinusitis, or otitis; no further testing or treatment needed; asked to ret prn...    He had ORTHO f/u- DrDuda 06/03/17>  Prev right sided radicular pain & he reports resolved on his recent antibiotics + Pred for sinuses; asked to ret prn...    [facility]  Diabetic eye exam, s/p bilat cat surg w/ lens implants, no retinopathy    He  saw DERM-[facility]Jorizzo [facility] 16/10/96>  Oral lichen planus on Mycelex troche one dissolved in mouth daily,  Diflucan100-one tab weekly, Prograf'1mg'$  cap dissolved in water & swish as directed, MTX2.0-4VWUJ/WJ, Folic'1mg'$ /d;  Improved since MTX added, tolerating meds, still some burning, & they saw oral candida (Rx Diflucan/ Mycelex)...       Pt called 10/08/17 c/o nasal drainage- yellow green + some diarrhea, fatigue, body aches; we called in Flagyl '250mg'$ Tid x7d; he has mult somatic complaints that he wants to relate to his time in [place]; wonders if he had food poisoning w/ recent diarrhea; under a lot of stress w/ tree damage on his property- offered Ativan rx '1mg'$ - 1/2 to 1 tab Bid...  We reviewed the following medical problems during today's office visit >>     Oral lesion> lichenoid mucositis, oral erosive lichen planus- eval by DrLupton, DrShoemaker, DrJorizzo at [facility] & now improved on MTX, folate, Mycelex, Prograf swish from [facility]- improved.    AR> prev on allergy shots per DrESL, OTC antihist, Saline, Flonase; gets occas bouts of sinusitis but overall improved but he's concerned not doing as well off the shots- asked to check in w/ DrVanWinkle.    OSA> Sleep Study 2/04 w/ RDI=45 & desat to 88% w/ mod snoring & leg jerks, but intol to CPAP & never used; states he sleeps fine now if he stays on his side; denies daytime sleepiness or other issues, naps 1/7...    COPD> exsmoker quit 1981; on AdvairHFA115-2spBid w/ spacer, Mucinex prn; he doesn't like Pred Rx; prev Bronchitic exac treated w/ antibiotics (Levaquin/ Augmentin), Medrol, Mucinex & improved; needs incr exercise program...    Medical Issues>  HBP (controlled), CAD (DrHochrein last 4/17), HL (on Niaspan, FLP ok 2/18), DM (on Metform, Labs 2/19 w/ BS=140 A1c=7.2, adding Glimep2) , Gastroparesis, BPH, DJD- Rhip & back pain, anxiety... EXAM shows Afeb, VSS, O2sat=99%;  Wt=215#, 6'2"Tall, BMI=28;  Heent- neg, mallampati2;  Chest- clear w/o w/r/r;  Heart- RR w/o m/r/g;  Abd- soft, neg, nontender;  Ext- neg w/o c/c/e;  Neuro- intact...  LABS 07/2017 at [facility]   Chems- ok w/ BS=156, Cr=0.84, LFTs wnl;  CBC- ok w/ Hg=12.9, wbc=6.2.Marland KitchenMarland Kitchen  LABS 10/18/17>  FLP- at goals on Niacin500;  Chems- ok x BS=140, A1c=7.2 on Metform500Bid- ADD GLIMEP'2mg'$ /d;  CBC- ok w/ Hg=12.8, wbc=6.4;  TSH=0.76;  PSA=2.18... IMP/PLAN>>  FLP is ok on Niaspan500, continue same;  DM control not as good w/ BS=140 & A1c=7.2 on Metform500bid, rec adding glimep'2mg'$ Qam; he will maintain f/u w/ DERM-[facility]Jorizzo at [facility]; otherw continue same meds and try to avoid infections, may need to get back on allergy shots...   ~  December 09, 2017:  35moROV & add-on appt for recurrent sinusitis> Pt presents c/o 2wk hx sinus congestion/ pressure, drainage & blowing out yellow/green mucus, denies fever but feel chiily & aching/sore/ mult somatic complaints;  He's been taking Claritin, Flonase, Mucinex, and [place] nasal mist;  He has a hx of recurrent sinus infections & we discussed referral to ENT for additional eval... We reviewed his pulm/ medical problems as listed above...    EXAM shows Afeb, VSS, O2sat=99%;  Wt=222#, 6'2"Tall, BMI=29;  Heent- +sinus congestion, erythema, & max sinus tenderness, mallampati2;  Chest- clear w/o w/r/r;  Heart- RR w/o m/r/g;  Abd- soft, neg, nontender;  Ext- neg w/o c/c/e;  Neuro- intact... IMP/PLAN>>  DMarden Noblehas yet another sinus infection & we will treat w/ Levaquin '500mg'$ x10d and Depo80 + Pred '20mg'$  tabs- tapering sched (see AVS);  OK  to continue the [place], [place], & Mucinex as before;  We will arrange for the ENT appt...    ~  April 21, 2018:  39moROV & Jesse Santos reports that his breathing is good and his CC revolves around his oral mucosal LICHEN PLANUS evaluated & treated by DrJorrizo, [place] on MTX, Pred, & Tacrolimus, but pt reports that he wants to use Mycophenolate (Cellcept) but that the drug was not approved by his insurance company for this indication (I researched GoodRx and generic Mycophenolate '500mg'$  tabs cost as low as $42 for 120 tabs) We reviewed the following interval  [facility] [place]>     He saw ENT- DrShoemaker on 12/20/17> hx acute recurrent sinusitis, dev septum, AR, lichen planus, hearing loss- wears hearing aides; Note reviewed- DrShoemaker noted he wears nightly humidified CPAP & was currently stable (but he tells me he has never used the CPAP); he had dev septum & inferior turbinate hypertrophy; he was improved after the Levaquin 7 rec to continue the antihist, saline mist, Flonase qhs and call if symptoms worsening for CT & further eval as needed...     [facility] c/o weakness of 2wks duration, esp legs, & had 1 fall; Note reviewed- ROS otherw neg, EXAM was otherw unremarkable & no focal abn:  LABS & CXR OK- see below; pt reassured...     He saw DERM- [facility]Jorizzo- [facility] on 03/08/18>  Followed for oral Lichen Planus & oral candidiasis- on MTX 2.'5mg'$ -3/wk, Folic'1mg'$ /d, Pred2.'5mg'$ Bid, Diflucan100-tapering sched, Mycelex troches daily, Tacrolimus- 1cap dissolved in water & swished as directed;  Note reviewed, no changes made in therapy... We reviewed the following medical problems during today's office visit >>     Oral lesion> lichenoid mucositis, oral erosive lichen planus- eval by DrLupton, DrShoemaker, [facility]orizzo at [facility]& now improved on MTX, folate, Pred, Mycelex, [facility]  Pt reports that DrJorizzo wants to use Mycophenolate (Cellcept) but his insurance won't approve this med for this indication- I checked GoodRx and generic Mycophenolate '500mg'$  tabs cost as low as $42 for 120 tabs.    AR> prev on allergy shots per DrESL, OTC antihist, Saline, Flonase; gets occas bouts of sinusitis but overall improved but he's concerned not doing as well off the shots- asked to check in w/ DrVanWinkle; he saw DrShoemaker for ENT 12/2017...    OSA> Sleep Study 2/04 w/ RDI=45 & desat to 88% w/ mod snoring & leg jerks, but intol to CPAP & never used; states he sleeps fine now if he stays on his side; denies daytime  sleepiness or other issues, naps 1/7...    COPD> exsmoker quit 1981; on AdvairHFA115-2spBid w/ spacer, Mucinex prn; he doesn't like Pred Rx; prev Bronchitic exac treated w/ antibiotics (Levaquin/ Augmentin), Medrol, Mucinex & improved; needs incr exercise program...    Medical Issues>  HBP (controlled on Aten/ Losar), CAD (DrHochrein last 4/17), HL (on Niaspan, FLP ok 2/18), DM (on Metform/ Glimep, Labs 8/19 w/ BS=133 A1c=6.8) , Gastroparesis (on Protonix), BPH (on Flomax), DJD- Rhip & back pain (he needs a BMD eval), anxiety (not on meds)... EXAM shows Afeb, VSS, O2sat=99%;  Wt=218#, 6'2"Tall, BMI=28;  Heent- neg, mallampati2;  Chest- clear w/o w/r/r;  Heart- RR w/o m/r/g;  Abd- soft, neg, nontender;  Ext- neg w/o c/c/e;  Neuro- intact...  CXR 02/27/18>  Norm heart size, aortic atherosclerosis, clear lungs, NAD..Marland KitchenMarland Kitchen EKG 02/27/18>  NSR rate69, +PVCs, otherw wnl- NAD..Marland KitchenMarland Kitchen  LABS 02/27/18>  Chems- ok w/ K=4.2, BS=124, Cr=0.88, LFTs wnl;  CBC- ok w/ Hg=12.8, WBC=4.5;  CPK=75;  B12=474;  UA- clear...  LABS 04/21/18>  Chems- ok w K=4.1, BS=133, Cr=0.86;  A1c=6.8 on Metform500Bid & Glimep'2mg'$ ... IMP/PLAN>>  Jesse Santos is clinically stable but has a rough time w/ the Lichen Planus- eval & rx from DrJorizzo, [facility];  They are considering the Mycophenolate & I reported to him the cash-pay price thru GoodRx;  He feels sl depressed but declines antidepressant & doesn't want more meds;  He requests a handicap parking permit (form completed for pt);  He will continue current meds and planned f/u visits;  He needs a baseline BMD on the chr Pred rx- pending...   ~  ADDENDUM >> BMD done 05/06/18 showed lowest Tscore = -0.9 in Left femoral neck & this is WNL- no osteopenia identified...  ~  ADDENDUM >> Pt [facility] DER[facility]izzo[facility] on 2/68/34>  F/u oral lichen planus on MTX+ oral tacrolimus swish&spit + clotrimazole troches; overall improved but erosion on left lat tongue persists=> rec for repeat tongue bx... ~  ADDENDUM >> Pt saw  ENT- DrShoemaker on 05/09/18>  Note reviewed- prev bx by DrLupton 1962=> benign lichen planus; EXAM showed erythematous lesion left lat tongue, no ulcer or bleeding, norm tongue mobility, floor of mouth ok, no cervical adenopathy palp; Plan is for re-biopsy to r/o malig degeneration... ~  ADDENDUM >> Pt had Bx left side of tongue lesion on 05/18/18 by DrShoemaker> PATH: FINAL PATHOLOGIC DIAGNOSIS MICROSCOPIC EXAMINATION AND DIAGNOSIS "TONGUE LESION", BIOPSY=Invasive squamous cell carcinoma, moderately differentiated.  Note: p16 immunostain is negative (<60% positive tumor cells). ~  ADDENDUM >> CT Scan NECK on 05/31/18 showed> IMPRESSION: Left anterior tongue mass with extensive submucosal component reaching the midline, 5 x 2.5 cm on axial slices. There are 2 ipsilateral metastatic nodes measuring up to 15 mm. Atherosclerotic calcif, bilat cataract resections, Cspine ankylosis at C3-4 & C4-5 w/ diffuse degen dis...  ~  ADDENDUM >> PET Scan= pending         Problem List:  HEARING LOSS (ICD-389.9) - he has bilat hearing aides...  ALLERGIC RHINITIS (ICD-477.9) - on allergy shots from DrESL Gaetano Hawthorne)... plus Claritin, Saline, Mucinex, Flonase. ~  occas bouts of sinusitis requiring antibiotic Rx... ~  8/13: he had allergy f/u DrVanWinkle> skin testing 2010 pos to dust mites and molds; doing well on shots once/mo; stable & no changes made... ~  3/14: he presented w/ an upper resp infection & treated w/ Depo, Dosepak, Zithromax... ~  8/14:  He had allergy f/u DrVanWinkle> Advair, Flonase, Saline, Mucinex; he checked FENO- reported normal... ~  7/15: stable on allergy meds from DrVanWinkle, plus AdvairHFA115; he had sinus/ AB exac 5/15 treated by TP w/ ZPak=> Levaquin & resolved...  OBSTRUCTIVE SLEEP APNEA (ICD-327.23) - sleep study 8/04 showed RDI= 45 & desat to 88%... mod snoring and leg jerks without sleep disruption... CPAP perscribed but not using it now- states he can't sleep w/ it on & furthermore he  is resting well as long as he stays on his side... denies daytime hypersomnolence & not interested in re-evaluation.  COPD (ICD-496) - ex-smoker, quit 1981 >>  ~  on ADVAIR 100Bid, PROAIR Prn, MUCINEX Bid... he had Pneumovax in 2008... doesn't like Pred Rx...  ~  10/11:  doing well except recent cough, min green sputum (no change in dyspnea)- we will Rx w/ Augmentin, incr Mucinex/ Fluids. ~  4/12:  Breathing at baseline, continue maintenance meds... ~  CXR  12/13 showed normal heart size, clear lungs, NAD... ~  1/14:  He's had 2 exac this past yr- saw TP w/ Rx & improved... ~  1/15:  He continues to do well on Advair + allergy Rx from DrVanWinkle w/ Flonase, Saline, Mucinex... ~  7/15: exsmoker quit 1981; on AdvairHFA115-2spBid, Mucinex prn; he doesn't like Pred Rx; he had Bronchitic exac treated by TP 5/15 w/ ZPak, then Levaquin, Mucinex, etc & improved; needs incr exercise program... ~  10/15: seen w/ refractory bronchitic episode reqiring ZPak=>Levaquin, Medrol, Mucinex, Hydromet, etc... ~  1/16: He had a URI/ AB exac treated 10/15 w/ Levaquin/ Medrol/ Mucinex & resolved; now at baseline on Advair115-2spBid etc... ~  11/2016> he developed a LLL pneumonia (nos) & resolved w/ Rocephin/ Zithromax, Medrol, Nebs, etc...  HYPERTENSION (ICD-401.9) - prev on diet alone> then on Diovan320, but we decided to change to LOSARTAN '100mg'$ /d 5/13 to save $$ ~  He claims that Norvasc & HCTZ "stopped my urine flow"... ~  10/11:  BP= 128/80 & even better at home he says... denies HA, fatigue, visual changes, CP, palipit, dizziness, syncope, dyspnea, edema, etc... ~  4/12:  Pt has seen TP, DrHochrein, & checking BP daily at home; tried on Norvasc but claims it decr his urine flow; now on Diovan & improved w/ BP= 132/70... ~  5/12:  BP remains well controlled on Diovan '320mg'$ /d= 120/62 today. ~  11/12:  BP= 142/82 on Diovan160 now; denies CP, palpit, dizzy, SOB, edema... ~  5/13:  BP= 128/66 & he's requesting change  to cheaper med; rec switch Diovan to LOSARTAN '100mg'$ /d... ~  1/14: on Losar100; BP=140/84, he notes incr HR he says & we decided to add low dose ATENOLOL'25mg'$ /d... ~  7/14: on Aten25, Losar100> BP=128/72 & he denies CP, palpit, SOB, edema, etc... ~  1/15: BP controlled on Aten25, Losar100 w/ BP= 130/80 & he denies CP, palpit, SOB, edema. ~  7/15: on Aten25, Losar100; BP=124/70, he denies CP, palpit, dizzy, SOB, edema, etc. ~  10/15: on Aten25, Losar100; BP= 140/60 & remains stable hemodynamically... ~  BP remains under good control on same meds...  R/O CAD (ICD-414.00) - on ASA '81mg'$ /d... Followed by DrHochrein & his notes are reviewed. ~  hx neg cardiac cath 1988 by DrJoe Gilberton... ~  NuclearStressTest 10/02 was norm- no scar or ischemia, EF=55%. ~  CT Abd 4/10 via ER showed coronary calcif... referred to Cards. ~  Eval by DrHochrein 6/10 showed norm EKG & cardiac exam... treadmill ordered. ~  Treadmill 6/10 showed reasonable exerc tolerance, no ischemic changes, few PVCs, sl incr BP... ~  EKG 3/13 showed NSR, rate76, WNL, NAD... ~  Myoview 3/13 was neg> exercised for 88mn, stopped for fatigue & CP; no ST seg changes, occas PACs & PVCs; hypertensive BP response; no ischemia, EF=71%, normal wall motion... ~  He saw DrHochrein 4/14> hx CAD, doing satis on ASA & BP rx w/o angina & rec to follow program of aggressive risk factor reduction, no change in meds.  ~  EKG 4/14 showed NSR, rate60, wnl, NAD.. ~  6/15: .on ASA81; followed by DrHochrein- last seen 4/15- no new symptoms, active w/ yard work, EKG showed wnl, they decided to do Treadmill (borderline ST depression), then Myoview 6/15 showed NEG- no ischemia, decr uptake inferiorly same as 2013, EF=69%, norm wall motion, low risk...  MIXED HYPERLIPIDEMIA (ICD-272.2) - low HDL and NIASPAN '500mg'$ /d started 4/09... ~  [place] showed TChol 160, TG 106, HDL 26, LDL 113... rec-  diet + Niaspan 500/d. ~  FLP 10/09 showed TChol 155, TG 78, HDL 33, LDL  106... ~  FLP 4/10 showed TChol 133, TG 63, HDL 30, LDL 91... rec> continue same. ~  FLP 4/11 showed TChol 144, TG 61, HDL 38, LDL 94 ~  FLP 10/11 showed TChol 155, TG 81, HDL 40, LDL 99 ~  FLP 4/12 showed TChol 142, TG 69, HDL 38, LDL 90 ~  FLP 11/12 showed TChol 165, TG 82, HDL 43, LDL 106 ~  FLP 5/13 in Niasp500 showed TChol 155, TG 72, HDL 46, LDL 95... rec change to generic. ~  FLP 1/14 on Niacin500 showed TChol 140, TG 56, HDL 39, LDL 90 ~  FLP 1/15 on Niaspan500 showed TChol 148, TG 57, HDL 43, LDL 94  ~  FLP 1/16 on Niaspan500 showed TChol 150, TG 101, HDL 45, LDL 85 ~  FLP 1/17 on Niaspan500 showed TChol 132, TG 52, HDL 46, LDL 75  DM (ICD-250.00) - on diet + METFORMIN '500mg'$ Bid... he reports BS at home all in the 120-130's... ~  labs 4/09 showed BS= 132, HgA1c= 6.6.Marland KitchenMarland Kitchen rec- same med, better diet... ~  labs 10/09 showed BS= 131, HgA1c= 6.3.Marland KitchenMarland Kitchen ~  labs 4/10 showed BS= 117, HgA1c= 6.2.[name]. rec> same meds/ diet Rx. ~  labs 10/10 showed BS= 138, A1c= 6.5 ~  labs 4/11 showed BS= 121, A1c= 6.5.Marland KitchenMarland Kitchen On Metform500Bid + diet. ~  Dilated eye exam 5/11 by DrShapiro- no retinopathy... ~  labs 10/11 (wt=228#) showed BS= 118, A1c= 7.1.[name]. not as good- get on diet or more meds. ~  Labs 4/12 (wt=229#) showed BS= 119, A1c= 7.3... Wrong direction, may need more meds, get wt down! ~  5/12:  Ophthalmology check by DrShapiro was neg- no retinopathy... ~  Labs 11/12 showed BS= 136, A1c= 7.0 ~  Labs 5/13 on MetformBid showed BS= 132, A1c= 7.1.Marland KitchenMarland Kitchen Continue same, get wt down. ~  Ophthalmology check from DrShapiro 5/13> no DM retinopathy... ~  Labs 1/14 showed BS= 134, A1c= 7.5; not as good- needs better diet, get wt down, same meds for now... ~  Labs 7/14 on Metform500Bid showed BS= 140, A1c= 7.3  ~  Labs 1/154 on Metform500Bid showed BS= 129 & A1c= 7.0, and we reviewed meds/ diet/ exercise/ weight... ~  Labs 7/15 on Metform500Bid showed BS= 126, A1c= 7.1 & reminded of need for diet/ exerc/ wt reduction... ~   Labs 1/16 on Metform500Bid showed BS= 133, A1c= 7.2 ~  Labs 1/17 on Metform500Bid showed BS= 134, A1c= 6.8  GERD (ICD-530.81) & ? GASTROPARESIS - on PROTONIX '40mg'$ /d, & off Reglan per DrPerry... ~   last EGD 6/07 by DrPerry showed gastic polyp... ~  8/10:  given Compazine suppos for gastroparesis flair by DrGessner. ~  4/12:  C/o intermittent dysphagia & referred back to GI for further eval (colonoscopy due as well)...  DIVERTICULOSIS OF COLON (ICD-562.10) - he uses MIRALAX Bid regularly... IRRITABLE BOWEL SYNDROME (ICD-564.1) COLONIC POLYPS (ICD-211.3) ~  last colonoscopy 5/07 by DrSam showed divertics only... f/u 39yr. ~  CT Abd 4/10 in ER showed atx bases, coronary calcif, s/p GB, abdAo calcif, 18mrenal stone, divertics, bilat hip AVN. ~  Follow up colonoscopy by DrPerry 5/12 showed severe diverticulosis, 2 sm polyps= tubular adenoma & f/u planned 5 yrs...  RENAL CALCULUS, HX OF (ICD-V13.01) BENIGN PROSTATIC HYPERTROPHY, HX OF (ICD-V13.8) - on FLOMAX 0.'4mg'$ /d w/ improved symptoms (he states flow better since stopping Vit E supplement). ~  4/12:  Routine PSA= 4.9 (  it was 1.02 4/11) & we rx w/ Doxy Bid x14d, thewn plan recheck PSA==> 1.30  DEGENERATIVE JOINT DISEASE (ICD-715.90) - pt states "I have a bum knee" and eval by DrDuda w/ viscosupplementation shots... Note: Abd CT w/ bilat hip AVN noted... takes TYLENOL Prn... ~  11/10:  s/p left knee arthroscopy by DrDuda... ~  10/11:  s/p fall w/ left knee injury- s/p cortisone shot by DrDuda; hx shot in left shoulder too. ~  7/14: he is c/o incr pain in left hip area; known hx AVN & needs f/u by Ortho...  BACK PAIN, LUMBAR (ICD-724.2)  ANXIETY (ICD-300.00) - not currently on meds for nerves.  ANEMIA (ICD-285.9) - prev hx of GIB... ~  labs 4/09 showed Hg= 14.1 ~  labs 4/10 showed Hg= 13.6 ~  labs 4/11 showed Hg= 13.4 ~  Labs 4/12 showed Hg= 13.8 ~  Labs 3/13 showed Hg= 13.1 ~  Labs 1/14 showed Hg= 13.6 ~  Labs 1/15 showed Hg= 13.7 ~   Labs 1/16 showed Hg= 14.1 ~  CBC remains WNL...  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  Health Maintenance: ~  GI: followed by DrPerry> Colonoscopy 5/07 by DrSam... f/u planned 5 yrs. ~  GU:  See PSAs recorded above... ~  Immunizations: Pneumovax in 2008 @ age 24;  [place] given 7/15; Tetanus- given 4/10;  he gets yearly seasonal Flu vaccine.   Past Surgical History:  Procedure Laterality Date  . CATARACT EXTRACTION    . CHOLECYSTECTOMY    . KNEE SURGERY  11/10  . SKIN CANCER EXCISION  3/10   left hand     Outpatient Encounter Medications as of 04/21/2018  Medication Sig  . acetaminophen (TYLENOL) 500 MG tablet Take 500 mg by mouth every 6 (six) hours as needed for headache (pain).   [name] aspirin EC 81 MG tablet Take 81 mg by mouth at bedtime.  [name] atenolol (TENORMIN) 25 MG tablet Take 1 tablet (25 mg total) by mouth daily.  . Cholecalciferol (VITAMIN D) 2000 UNITS CAPS Take 2,000 Units by mouth daily.   . clotrimazole (MYCELEX) 10 MG troche Take 10 mg by mouth daily.   [name] dextromethorphan-guaiFENesin (MUCINEX DM) 30-600 MG per 12 hr tablet Take 1 tablet by mouth every 12 (twelve) hours.  . fluconazole (DIFLUCAN) 100 MG tablet Take 100 mg by mouth daily. 1 tab for 5 days then 1 tab a week for 5 weeks  . fluticasone (FLONASE) 50 MCG/ACT nasal spray Place 2 sprays into both nostrils daily.  . fluticasone-salmeterol (ADVAIR HFA) 115-21 MCG/ACT inhaler Inhale 1 puff into the lungs 2 (two) times daily.  . folic acid (FOLVITE) 1 MG tablet Take 1 mg by mouth daily with lunch.   . glimepiride (AMARYL) 2 MG tablet Take 1 tablet (2 mg total) by mouth daily with breakfast.  . glucose blood (ONE TOUCH ULTRA TEST) test strip TEST ONCE DAILY AS DIRECTED  . hyoscyamine (LEVSIN SL) 0.125 MG SL tablet Place 1 tablet (0.125 mg total) under the tongue every 4 (four) hours as needed. As needed for abdominal cramping  . lidocaine (ASPERCREME W/LIDOCAINE) 4 % cream Apply 1 application  topically daily as needed (pain).   [name] lidocaine (XYLOCAINE) 2 % solution Use as directed 15 mLs in the mouth or throat as needed for mouth pain.  [name] loratadine (CLARITIN) 10 MG tablet Take 10 mg by mouth daily as needed for allergies.   [name] losartan (COZAAR) 50 MG tablet Take 1 tablet (50  mg total) by mouth daily.  . metFORMIN (GLUCOPHAGE) 500 MG tablet Take 1 tablet (500 mg total) by mouth 2 (two) times daily with a meal.  . methotrexate (RHEUMATREX) 2.5 MG tablet Take 3 tablets (7.5 mg total) by mouth See admin instructions. Take 3 tablets (7.5 mg) by mouth on Tuesdays with lunch, starting 11/24/2016  . Multiple Vitamins-Minerals (MULTIVITAMIN WITH MINERALS) tablet Take 1 tablet by mouth daily.    . niacin (NIASPAN) 500 MG CR tablet Take 1 tablet (500 mg total) by mouth at bedtime.  . nitroGLYCERIN (NITROSTAT) 0.4 MG SL tablet Place 1 tablet (0.4 mg total) under the tongue every 5 (five) minutes as needed for chest pain. (Patient taking differently: Place 0.4 mg under the tongue every 5 (five) minutes as needed for chest pain. )  . pantoprazole (PROTONIX) 40 MG tablet Take 1 tablet (40 mg total) by mouth daily.  . polyethylene glycol powder (GLYCOLAX/MIRALAX) powder TAKE 17 GRAMS BY MOUTH TWICE DAILY AS NEEDED  . predniSONE (DELTASONE) 2.5 MG tablet Take 2.5 mg by mouth 2 (two) times daily with a meal.  . Probiotic Product (PROBIOTIC PO) Take 1 tablet by mouth daily.  . prochlorperazine (COMPRO) 25 MG suppository PLACE 1 SUPPOSITORY (25 MG TOTAL) RECTALLY EVERY 12 (TWELVE) HOURS AS NEEDED FOR NAUSEA.  . sodium chloride (OCEAN) 0.65 % nasal spray Place 1 spray into the nose 3 (three) times daily as needed for congestion.    . tacrolimus (PROGRAF) 1 MG capsule Take 1 mg by mouth See admin instructions. Dissolve 1 capsule (1 mg) in 500 ml of water and keep in refrigerator - swish and spit small amount twice daily  . tamsulosin (FLOMAX) 0.4 MG CAPS capsule Take 1 capsule (0.4 mg total) by mouth 2 (two)  times daily.  . traMADol (ULTRAM) 50 MG tablet TAKE ONE TABLET BY MOUTH THREE TIMES A DAY AS NEEDED FOR PAIN  . [DISCONTINUED] atenolol (TENORMIN) 25 MG tablet Take 1 tablet (25 mg total) by mouth daily.  . [DISCONTINUED] COMPRO 25 MG suppository PLACE 1 SUPPOSITORY (25 MG TOTAL) RECTALLY EVERY 12 (TWELVE) HOURS AS NEEDED FOR NAUSEA.  . [DISCONTINUED] fluticasone-salmeterol (ADVAIR HFA) 115-21 MCG/ACT inhaler Inhale 1 puff into the lungs 2 (two) times daily.  . [DISCONTINUED] levofloxacin (LEVAQUIN) 500 MG tablet Take 1 tablet (500 mg total) by mouth daily.  . [DISCONTINUED] losartan (COZAAR) 50 MG tablet Take 1 tablet (50 mg total) by mouth daily.  . [DISCONTINUED] metFORMIN (GLUCOPHAGE) 500 MG tablet Take 1 tablet (500 mg total) by mouth 2 (two) times daily with a meal.  . [DISCONTINUED] niacin (NIASPAN) 500 MG CR tablet Take 1 tablet (500 mg total) by mouth at bedtime.  . [DISCONTINUED] pantoprazole (PROTONIX) 40 MG tablet Take 1 tablet (40 mg total) by mouth daily.  . [DISCONTINUED] polyethylene glycol powder (GLYCOLAX/MIRALAX) powder TAKE 17 GRAMS BY MOUTH TWICE DAILY AS NEEDED  . [DISCONTINUED] predniSONE (DELTASONE) 20 MG tablet Take as directed  . [DISCONTINUED] tamsulosin (FLOMAX) 0.4 MG CAPS capsule Take 1 capsule (0.4 mg total) by mouth 2 (two) times daily.  . [DISCONTINUED] traMADol (ULTRAM) 50 MG tablet TAKE ONE TABLET BY MOUTH THREE TIMES A DAY AS NEEDED FOR PAIN   No facility-administered encounter medications on file as of 04/21/2018.     Allergies  Allergen Reactions  . Prednisone Other (See Comments)    REACTION: high dose intolerance - causes numbness from waist down - low dose tapered course ok    Immunization History  Administered Date(s) Administered  .  H1N1 08/21/2008  . Influenza Split 06/01/2011, 05/26/2012  . Influenza Whole 06/06/2008, 06/21/2009, 06/06/2010  . Influenza, High Dose Seasonal PF 07/03/2016, 07/02/2017  . Influenza,inj,Quad PF,6+ Mos 06/07/2013,  07/09/2014, 06/10/2015  . Pneumococcal Conjugate-13 04/05/2014  . Pneumococcal Polysaccharide-23 06/30/2006  . Zoster 09/14/2010    Current Medications, Allergies, Past Medical History, Past Surgical History, Family History, and Social History were reviewed in [employer] record.    Review of Systems         See HPI - all other systems neg except as noted... The patient complains of decreased hearing, dyspnea on exertion, headaches, and difficulty walking.  The patient denies anorexia, fever, weight loss, weight gain, vision loss, hoarseness, chest pain, syncope, peripheral edema, prolonged cough, hemoptysis, abdominal pain, melena, hematochezia, severe indigestion/heartburn, hematuria, incontinence, muscle weakness, suspicious skin lesions, transient blindness, depression, unusual weight change, abnormal bleeding, enlarged lymph nodes, and angioedema.     Objective:   Physical Exam      WD, WN, 82 y/o WM in NAD... GENERAL:  Alert & oriented; pleasant & cooperative... HEENT:  [place]/AT, EOM-full, PERRLA, EACs-clear, TMs-wnl, NOSE- pale, congested, THROAT-clear, but lichenoid lesion on lat tongue. NECK:  Supple w/ fairROM; no JVD; normal carotid impulses w/o bruits; no thyromegaly or nodules palpated; no lymphadenopathy. CHEST:  Clear to P & A w/o wheezing, rhonchi, rales, or signs of consolidation... HEART:  Regular Rhythm; without murmurs/ rubs/ or gallops detected...  ABDOMEN:  Soft & nontender; normal bowel sounds; no organomegaly or masses palpated., no guarding, or rebound EXT: without deformities, mild arthritic changes; no varicose veins/ venous insuffic/ or edema. NEURO:  CN's intact;  no focal neuro deficits... DERM:  No lesions noted; no rash etc...  RADIOLOGY DATA:  Reviewed in the EPIC EMR & discussed w/ the patient...  LABORATORY DATA:  Reviewed in the EPIC EMR & discussed w/ the patient...   Assessment & Plan:    Lesion on tongue>. LICHEN PLANUS  and he is improved on Rx from [place] (MTX, Mycelex, & Prograf=> dissolve in water/ swish/ & spit)...  Recurrent Sinusitis> Hx Acute Bronchitis> presented 10/15 w/ refractory bronchitic episode; given ZPak=>Levaquin, Depo80/ Medrol dosepak, Mucinex, Hydromet, etc=> resolved... COPD exac>  He continues on Advair, Proair rescue, Mucinex; also Claritin, Flonase, Saline; stable continue same Rx... LLL Pneumonia 11/2016>  Hosp by [facility] & treated w/ Rocephin/Zithromax, Medrol taper, NEBS, etc & improved => resolved... 05/27/17>   Jesse Santos has had a long hx recurrent sinusitis & allergies followed by DrESL at [facility] [place]; now off shots & having more difficulty; he has seen DrVanWinkle in follow up;  We discussed Rx w/ Levaquin '750mg'$ /d x10d + Align & Activia;  Given Depo120 & continue Pred taper from DrDuda; continue Mucinex, Flonase, Claritin, [place] and f/u w/ ENT DrShoemaker for cerumen/ hearing aides/ sinusitis... 10/18/17>   FLP is ok on Niaspan500, continue same;  DM control not as good w/ BS=140 & A1c=7.2 on Metform500bid, rec adding glimep'2mg'$ Qam; he will maintain f/u w/ [facility]iz[facility] at [facility]; otherw continue same meds and try to avoid infections, may need to get back on allergy shots. 12/09/17>   Jesse Santos has yet another sinus infection & we will treat w/ Levaquin '500mg'$ x10d and Depo80 + Pred '20mg'$  tabs- tapering sched (see AVS);  OK to continue the [place], [place], & Mucinex as before;  We will arrange for the ENT appt.. 04/21/18>   Jesse Santos is clinically stable but has a rough time w/ the Lichen Planus- eval &  rx from [place], [facility];  They are considering the Mycophenolate & I reported to him the cash-pay price thru [facility];  He feels sl depressed but declines antidepressant & doesn't want more meds;  He requests a handicap parking permit (form completed for pt);  He will continue current meds and planned f/u visits;  He needs a baseline BMD on the chr Pred rx- pending   HBP>  On  LOSARTAN-'50mg'$ /d & ATEN-25 w/ good control of BP, continue same...  R/O CAD>  Followed by DrHochrein, and doing satis w/ neg Myoview 6/15... continue ASA & risk factor reduction....  Lipids>  Looks satis on his diet + Niacin...  DM>  Control is fair on Metform500Bid w/ A1c stable up to 7.2;  We decided to add Glimep'2mg'$  Qam w/ improved A1c to 6.8  GI>  Per DrPerry w/ GERD, ?Gastroparesis, Divertics, IBS, Colon polyps> f/u colon 5/12 w/ divertics & 2 sm adenomas, repeat planned ~18yr...  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 04/21/18>   We will sched a baseline BMD on the chr Pred therapy for the lichen planus...   Anxiety>  Certainly an issue, he does not want anxiolytic Rx...   Patient's Medications  New Prescriptions   No medications on file  Previous Medications   ACETAMINOPHEN (TYLENOL) 500 MG TABLET    Take 500 mg by mouth every 6 (six) hours as needed for headache (pain).    ASPIRIN EC 81 MG TABLET    Take 81 mg by mouth at bedtime.   CHOLECALCIFEROL (VITAMIN D) 2000 UNITS CAPS    Take 2,000 Units by mouth daily.    CLOTRIMAZOLE (MYCELEX) 10 MG TROCHE    Take 10 mg by mouth daily.    DEXTROMETHORPHAN-GUAIFENESIN (MUCINEX DM) 30-600 MG PER 12 HR TABLET    Take 1 tablet by mouth every 12 (twelve) hours.   FLUCONAZOLE (DIFLUCAN) 100 MG TABLET    Take 100 mg by mouth daily. 1 tab for 5 days then 1 tab a week for 5 weeks   FLUTICASONE (FLONASE) 50 MCG/ACT NASAL SPRAY    Place 2 sprays into both nostrils daily.   FOLIC ACID (FOLVITE) 1 MG TABLET    Take 1 mg by mouth daily with lunch.    GLIMEPIRIDE (AMARYL) 2 MG TABLET    Take 1 tablet (2 mg total) by mouth daily with breakfast.   GLUCOSE BLOOD (ONE TOUCH ULTRA TEST) TEST STRIP    TEST ONCE DAILY AS DIRECTED   HYOSCYAMINE (LEVSIN SL) 0.125 MG SL TABLET    Place 1 tablet (0.125 mg total) under the tongue every 4 (four) hours as needed. As needed for abdominal cramping   LIDOCAINE  (ASPERCREME W/LIDOCAINE) 4 % CREAM    Apply 1 application topically daily as needed (pain).    LIDOCAINE (XYLOCAINE) 2 % SOLUTION    Use as directed 15 mLs in the mouth or throat as needed for mouth pain.   LORATADINE (CLARITIN) 10 MG TABLET    Take 10 mg by mouth daily as needed for allergies.    METHOTREXATE (RHEUMATREX) 2.5 MG TABLET    Take 3 tablets (7.5 mg total) by mouth See admin instructions. Take 3 tablets (7.5 mg) by mouth on Tuesdays with lunch, starting 11/24/2016   MULTIPLE VITAMINS-MINERALS (MULTIVITAMIN WITH MINERALS) TABLET    Take 1 tablet by mouth daily.     NITROGLYCERIN (NITROSTAT) 0.4 MG SL TABLET    Place 1 tablet (  0.4 mg total) under the tongue every 5 (five) minutes as needed for chest pain.   PREDNISONE (DELTASONE) 2.5 MG TABLET    Take 2.5 mg by mouth 2 (two) times daily with a meal.   PROBIOTIC PRODUCT (PROBIOTIC PO)    Take 1 tablet by mouth daily.   SODIUM CHLORIDE (OCEAN) 0.65 % NASAL SPRAY    Place 1 spray into the nose 3 (three) times daily as needed for congestion.     TACROLIMUS (PROGRAF) 1 MG CAPSULE    Take 1 mg by mouth See admin instructions. Dissolve 1 capsule (1 mg) in 500 ml of water and keep in refrigerator - swish and spit small amount twice daily  Modified Medications   Modified Medication Previous Medication   ATENOLOL (TENORMIN) 25 MG TABLET atenolol (TENORMIN) 25 MG tablet      Take 1 tablet (25 mg total) by mouth daily.    Take 1 tablet (25 mg total) by mouth daily.   FLUTICASONE-SALMETEROL (ADVAIR HFA) 115-21 MCG/ACT INHALER fluticasone-salmeterol (ADVAIR HFA) 115-21 MCG/ACT inhaler      Inhale 1 puff into the lungs 2 (two) times daily.    Inhale 1 puff into the lungs 2 (two) times daily.   LOSARTAN (COZAAR) 50 MG TABLET losartan (COZAAR) 50 MG tablet      Take 1 tablet (50 mg total) by mouth daily.    Take 1 tablet (50 mg total) by mouth daily.   METFORMIN (GLUCOPHAGE) 500 MG TABLET metFORMIN (GLUCOPHAGE) 500 MG tablet      Take 1 tablet (500 mg  total) by mouth 2 (two) times daily with a meal.    Take 1 tablet (500 mg total) by mouth 2 (two) times daily with a meal.   NIACIN (NIASPAN) 500 MG CR TABLET niacin (NIASPAN) 500 MG CR tablet      Take 1 tablet (500 mg total) by mouth at bedtime.    Take 1 tablet (500 mg total) by mouth at bedtime.   PANTOPRAZOLE (PROTONIX) 40 MG TABLET pantoprazole (PROTONIX) 40 MG tablet      Take 1 tablet (40 mg total) by mouth daily.    Take 1 tablet (40 mg total) by mouth daily.   POLYETHYLENE GLYCOL POWDER (GLYCOLAX/MIRALAX) POWDER polyethylene glycol powder (GLYCOLAX/MIRALAX) powder      TAKE 17 GRAMS BY MOUTH TWICE DAILY AS NEEDED    TAKE 17 GRAMS BY MOUTH TWICE DAILY AS NEEDED   PROCHLORPERAZINE (COMPRO) 25 MG SUPPOSITORY COMPRO 25 MG suppository      PLACE 1 SUPPOSITORY (25 MG TOTAL) RECTALLY EVERY 12 (TWELVE) HOURS AS NEEDED FOR NAUSEA.    PLACE 1 SUPPOSITORY (25 MG TOTAL) RECTALLY EVERY 12 (TWELVE) HOURS AS NEEDED FOR NAUSEA.   TAMSULOSIN (FLOMAX) 0.4 MG CAPS CAPSULE tamsulosin (FLOMAX) 0.4 MG CAPS capsule      Take 1 capsule (0.4 mg total) by mouth 2 (two) times daily.    Take 1 capsule (0.4 mg total) by mouth 2 (two) times daily.   TRAMADOL (ULTRAM) 50 MG TABLET traMADol (ULTRAM) 50 MG tablet      TAKE ONE TABLET BY MOUTH THREE TIMES A DAY AS NEEDED FOR PAIN    TAKE ONE TABLET BY MOUTH THREE TIMES A DAY AS NEEDED FOR PAIN  Discontinued Medications   LEVOFLOXACIN (LEVAQUIN) 500 MG TABLET    Take 1 tablet (500 mg total) by mouth daily.   PREDNISONE (DELTASONE) 20 MG TABLET    Take as directed

## 2018-04-28 DIAGNOSIS — J3089 Other allergic rhinitis: Secondary | ICD-10-CM | POA: Diagnosis not present

## 2018-04-28 DIAGNOSIS — J4540 Moderate persistent asthma, uncomplicated: Secondary | ICD-10-CM | POA: Diagnosis not present

## 2018-05-02 ENCOUNTER — Other Ambulatory Visit: Payer: Self-pay | Admitting: Pulmonary Disease

## 2018-05-02 DIAGNOSIS — Z0000 Encounter for general adult medical examination without abnormal findings: Secondary | ICD-10-CM

## 2018-05-02 DIAGNOSIS — Z9189 Other specified personal risk factors, not elsewhere classified: Secondary | ICD-10-CM

## 2018-05-02 DIAGNOSIS — M1990 Unspecified osteoarthritis, unspecified site: Secondary | ICD-10-CM

## 2018-05-02 DIAGNOSIS — M818 Other osteoporosis without current pathological fracture: Secondary | ICD-10-CM

## 2018-05-04 DIAGNOSIS — L089 Local infection of the skin and subcutaneous tissue, unspecified: Secondary | ICD-10-CM | POA: Diagnosis not present

## 2018-05-04 DIAGNOSIS — L438 Other lichen planus: Secondary | ICD-10-CM | POA: Diagnosis not present

## 2018-05-04 DIAGNOSIS — Z5181 Encounter for therapeutic drug level monitoring: Secondary | ICD-10-CM | POA: Diagnosis not present

## 2018-05-06 ENCOUNTER — Ambulatory Visit (INDEPENDENT_AMBULATORY_CARE_PROVIDER_SITE_OTHER)
Admission: RE | Admit: 2018-05-06 | Discharge: 2018-05-06 | Disposition: A | Discharge status: Home / Self Care | Payer: Medicare Other | Source: Ambulatory Visit | Attending: Pulmonary Disease | Admitting: Pulmonary Disease

## 2018-05-06 DIAGNOSIS — M818 Other osteoporosis without current pathological fracture: Secondary | ICD-10-CM | POA: Diagnosis not present

## 2018-05-09 DIAGNOSIS — K148 Other diseases of tongue: Secondary | ICD-10-CM | POA: Diagnosis not present

## 2018-05-09 DIAGNOSIS — L439 Lichen planus, unspecified: Secondary | ICD-10-CM | POA: Diagnosis not present

## 2018-05-18 DIAGNOSIS — C029 Malignant neoplasm of tongue, unspecified: Secondary | ICD-10-CM | POA: Diagnosis not present

## 2018-05-18 DIAGNOSIS — C0689 Malignant neoplasm of overlapping sites of other parts of mouth: Secondary | ICD-10-CM | POA: Diagnosis not present

## 2018-05-27 ENCOUNTER — Other Ambulatory Visit: Payer: Self-pay | Admitting: Otolaryngology

## 2018-05-27 DIAGNOSIS — C029 Malignant neoplasm of tongue, unspecified: Secondary | ICD-10-CM

## 2018-05-30 ENCOUNTER — Telehealth: Payer: Self-pay | Admitting: Pulmonary Disease

## 2018-05-30 NOTE — Telephone Encounter (Signed)
Per SN- ok for flu vaccine.  Called and spoke with patient.  Scheduled on injection schedule for 06/01/18 at 1020 with his Wife Barbaraann Share at 1025 06/01/18.  Nothing further at this time.

## 2018-05-30 NOTE — Telephone Encounter (Signed)
Called and spoke pt.  Pt stated he was recently dx with tongue cancer. Pt wanted to know if it would be okay to have a flu shot with recent dx.  Pt also wanted to make SN aware that he is scheduled for neck CT tomorrow.   SN please advise. Thanks  Current Outpatient Medications on File Prior to Visit  Medication Sig Dispense Refill  . acetaminophen (TYLENOL) 500 MG tablet Take 500 mg by mouth every 6 (six) hours as needed for headache (pain).     [name] aspirin EC 81 MG tablet Take 81 mg by mouth at bedtime.    [name] atenolol (TENORMIN) 25 MG tablet Take 1 tablet (25 mg total) by mouth daily. 90 tablet 3  . Cholecalciferol (VITAMIN D) 2000 UNITS CAPS Take 2,000 Units by mouth daily.     . clotrimazole (MYCELEX) 10 MG troche Take 10 mg by mouth daily.     [name] dextromethorphan-guaiFENesin (MUCINEX DM) 30-600 MG per 12 hr tablet Take 1 tablet by mouth every 12 (twelve) hours.    . fluconazole (DIFLUCAN) 100 MG tablet Take 100 mg by mouth daily. 1 tab for 5 days then 1 tab a week for 5 weeks    . fluticasone (FLONASE) 50 MCG/ACT nasal spray Place 2 sprays into both nostrils daily. 16 g 11  . fluticasone-salmeterol (ADVAIR HFA) 115-21 MCG/ACT inhaler Inhale 1 puff into the lungs 2 (two) times daily. 1 Inhaler 11  . folic acid (FOLVITE) 1 MG tablet Take 1 mg by mouth daily with lunch.     . glimepiride (AMARYL) 2 MG tablet Take 1 tablet (2 mg total) by mouth daily with breakfast. 90 tablet 3  . glucose blood (ONE TOUCH ULTRA TEST) test strip TEST ONCE DAILY AS DIRECTED 100 each 2  . hyoscyamine (LEVSIN SL) 0.125 MG SL tablet Place 1 tablet (0.125 mg total) under the tongue every 4 (four) hours as needed. As needed for abdominal cramping 30 tablet 5  . lidocaine (ASPERCREME W/LIDOCAINE) 4 % cream Apply 1 application topically daily as needed (pain).     [name] lidocaine (XYLOCAINE) 2 % solution Use as directed 15 mLs in the mouth or throat as needed for mouth pain.    [name] loratadine (CLARITIN) 10 MG tablet Take 10 mg  by mouth daily as needed for allergies.     [name] losartan (COZAAR) 50 MG tablet Take 1 tablet (50 mg total) by mouth daily. 90 tablet 3  . metFORMIN (GLUCOPHAGE) 500 MG tablet Take 1 tablet (500 mg total) by mouth 2 (two) times daily with a meal. 180 tablet 3  . methotrexate (RHEUMATREX) 2.5 MG tablet Take 3 tablets (7.5 mg total) by mouth See admin instructions. Take 3 tablets (7.5 mg) by mouth on Tuesdays with lunch, starting 11/24/2016 4 tablet 0  . Multiple Vitamins-Minerals (MULTIVITAMIN WITH MINERALS) tablet Take 1 tablet by mouth daily.      . niacin (NIASPAN) 500 MG CR tablet Take 1 tablet (500 mg total) by mouth at bedtime. 90 tablet 3  . nitroGLYCERIN (NITROSTAT) 0.4 MG SL tablet Place 1 tablet (0.4 mg total) under the tongue every 5 (five) minutes as needed for chest pain. (Patient taking differently: Place 0.4 mg under the tongue every 5 (five) minutes as needed for chest pain. ) 30 tablet 11  . pantoprazole (PROTONIX) 40 MG tablet Take 1 tablet (40 mg total) by mouth daily. 90 tablet 3  . polyethylene glycol powder (GLYCOLAX/MIRALAX) powder TAKE 17 GRAMS BY MOUTH TWICE DAILY  AS NEEDED 1054 g 5  . predniSONE (DELTASONE) 2.5 MG tablet Take 2.5 mg by mouth 2 (two) times daily with a meal.    . Probiotic Product (PROBIOTIC PO) Take 1 tablet by mouth daily.    . prochlorperazine (COMPRO) 25 MG suppository PLACE 1 SUPPOSITORY (25 MG TOTAL) RECTALLY EVERY 12 (TWELVE) HOURS AS NEEDED FOR NAUSEA. 12 suppository 0  . sodium chloride (OCEAN) 0.65 % nasal spray Place 1 spray into the nose 3 (three) times daily as needed for congestion.      . tacrolimus (PROGRAF) 1 MG capsule Take 1 mg by mouth See admin instructions. Dissolve 1 capsule (1 mg) in 500 ml of water and keep in refrigerator - swish and spit small amount twice daily    . tamsulosin (FLOMAX) 0.4 MG CAPS capsule Take 1 capsule (0.4 mg total) by mouth 2 (two) times daily. 180 capsule 3  . traMADol (ULTRAM) 50 MG tablet TAKE ONE TABLET BY MOUTH  THREE TIMES A DAY AS NEEDED FOR PAIN 90 tablet 3   No current facility-administered medications on file prior to visit.     Allergies  Allergen Reactions  . Prednisone Other (See Comments)    REACTION: high dose intolerance - causes numbness from waist down - low dose tapered course ok

## 2018-05-31 ENCOUNTER — Ambulatory Visit
Admission: RE | Admit: 2018-05-31 | Discharge: 2018-05-31 | Disposition: A | Discharge status: Home / Self Care | Payer: Medicare Other | Source: Ambulatory Visit | Attending: Otolaryngology | Admitting: Otolaryngology

## 2018-05-31 DIAGNOSIS — C029 Malignant neoplasm of tongue, unspecified: Secondary | ICD-10-CM

## 2018-05-31 DIAGNOSIS — K148 Other diseases of tongue: Secondary | ICD-10-CM | POA: Diagnosis not present

## 2018-05-31 MED ORDER — IOPAMIDOL (ISOVUE-300) INJECTION 61%
75.0000 mL | Freq: Once | INTRAVENOUS | Status: AC | PRN
  Administered 2018-05-31: 75 mL via INTRAVENOUS

## 2018-06-01 ENCOUNTER — Ambulatory Visit (INDEPENDENT_AMBULATORY_CARE_PROVIDER_SITE_OTHER): Payer: Medicare Other

## 2018-06-01 DIAGNOSIS — Z23 Encounter for immunization: Secondary | ICD-10-CM | POA: Diagnosis not present

## 2018-06-03 DIAGNOSIS — C029 Malignant neoplasm of tongue, unspecified: Secondary | ICD-10-CM | POA: Diagnosis not present

## 2018-06-08 DIAGNOSIS — Z87891 Personal history of nicotine dependence: Secondary | ICD-10-CM | POA: Diagnosis not present

## 2018-06-08 DIAGNOSIS — C029 Malignant neoplasm of tongue, unspecified: Secondary | ICD-10-CM | POA: Diagnosis not present

## 2018-06-08 DIAGNOSIS — Z888 Allergy status to other drugs, medicaments and biological substances status: Secondary | ICD-10-CM | POA: Diagnosis not present

## 2018-06-10 DIAGNOSIS — C029 Malignant neoplasm of tongue, unspecified: Secondary | ICD-10-CM | POA: Diagnosis not present

## 2018-06-10 DIAGNOSIS — C770 Secondary and unspecified malignant neoplasm of lymph nodes of head, face and neck: Secondary | ICD-10-CM | POA: Diagnosis not present

## 2018-06-10 DIAGNOSIS — C028 Malignant neoplasm of overlapping sites of tongue: Secondary | ICD-10-CM | POA: Diagnosis not present

## 2018-06-17 DIAGNOSIS — C029 Malignant neoplasm of tongue, unspecified: Secondary | ICD-10-CM | POA: Diagnosis not present

## 2018-06-17 DIAGNOSIS — C023 Malignant neoplasm of anterior two-thirds of tongue, part unspecified: Secondary | ICD-10-CM | POA: Diagnosis not present

## 2018-06-17 DIAGNOSIS — M7989 Other specified soft tissue disorders: Secondary | ICD-10-CM | POA: Diagnosis not present

## 2018-06-17 DIAGNOSIS — Z87891 Personal history of nicotine dependence: Secondary | ICD-10-CM | POA: Diagnosis not present

## 2018-06-17 DIAGNOSIS — Z888 Allergy status to other drugs, medicaments and biological substances status: Secondary | ICD-10-CM | POA: Diagnosis not present

## 2018-06-23 DIAGNOSIS — C029 Malignant neoplasm of tongue, unspecified: Secondary | ICD-10-CM | POA: Diagnosis not present

## 2018-06-23 DIAGNOSIS — Z87891 Personal history of nicotine dependence: Secondary | ICD-10-CM | POA: Diagnosis not present

## 2018-06-30 DIAGNOSIS — C029 Malignant neoplasm of tongue, unspecified: Secondary | ICD-10-CM | POA: Diagnosis not present

## 2018-07-05 DIAGNOSIS — Z7984 Long term (current) use of oral hypoglycemic drugs: Secondary | ICD-10-CM | POA: Diagnosis not present

## 2018-07-05 DIAGNOSIS — Z87891 Personal history of nicotine dependence: Secondary | ICD-10-CM | POA: Diagnosis not present

## 2018-07-05 DIAGNOSIS — J449 Chronic obstructive pulmonary disease, unspecified: Secondary | ICD-10-CM | POA: Diagnosis not present

## 2018-07-05 DIAGNOSIS — L438 Other lichen planus: Secondary | ICD-10-CM | POA: Diagnosis not present

## 2018-07-05 DIAGNOSIS — C029 Malignant neoplasm of tongue, unspecified: Secondary | ICD-10-CM | POA: Diagnosis not present

## 2018-07-05 DIAGNOSIS — G893 Neoplasm related pain (acute) (chronic): Secondary | ICD-10-CM | POA: Diagnosis not present

## 2018-07-05 DIAGNOSIS — E118 Type 2 diabetes mellitus with unspecified complications: Secondary | ICD-10-CM | POA: Diagnosis not present

## 2018-07-05 DIAGNOSIS — Z79899 Other long term (current) drug therapy: Secondary | ICD-10-CM | POA: Diagnosis not present

## 2018-07-12 DIAGNOSIS — E119 Type 2 diabetes mellitus without complications: Secondary | ICD-10-CM | POA: Diagnosis not present

## 2018-07-12 DIAGNOSIS — Z87891 Personal history of nicotine dependence: Secondary | ICD-10-CM | POA: Diagnosis not present

## 2018-07-12 DIAGNOSIS — L438 Other lichen planus: Secondary | ICD-10-CM | POA: Diagnosis not present

## 2018-07-12 DIAGNOSIS — E118 Type 2 diabetes mellitus with unspecified complications: Secondary | ICD-10-CM | POA: Diagnosis not present

## 2018-07-12 DIAGNOSIS — C029 Malignant neoplasm of tongue, unspecified: Secondary | ICD-10-CM | POA: Diagnosis not present

## 2018-07-12 DIAGNOSIS — J449 Chronic obstructive pulmonary disease, unspecified: Secondary | ICD-10-CM | POA: Diagnosis not present

## 2018-07-12 DIAGNOSIS — Z79899 Other long term (current) drug therapy: Secondary | ICD-10-CM | POA: Diagnosis not present

## 2018-07-12 DIAGNOSIS — Z7984 Long term (current) use of oral hypoglycemic drugs: Secondary | ICD-10-CM | POA: Diagnosis not present

## 2018-07-12 DIAGNOSIS — G893 Neoplasm related pain (acute) (chronic): Secondary | ICD-10-CM | POA: Diagnosis not present

## 2018-07-13 DIAGNOSIS — C029 Malignant neoplasm of tongue, unspecified: Secondary | ICD-10-CM | POA: Diagnosis not present

## 2018-07-19 DIAGNOSIS — K219 Gastro-esophageal reflux disease without esophagitis: Secondary | ICD-10-CM | POA: Diagnosis not present

## 2018-07-19 DIAGNOSIS — E785 Hyperlipidemia, unspecified: Secondary | ICD-10-CM | POA: Diagnosis not present

## 2018-07-19 DIAGNOSIS — I081 Rheumatic disorders of both mitral and tricuspid valves: Secondary | ICD-10-CM | POA: Diagnosis not present

## 2018-07-19 DIAGNOSIS — H4920 Sixth [abducent] nerve palsy, unspecified eye: Secondary | ICD-10-CM | POA: Diagnosis not present

## 2018-07-19 DIAGNOSIS — Z7984 Long term (current) use of oral hypoglycemic drugs: Secondary | ICD-10-CM | POA: Diagnosis not present

## 2018-07-19 DIAGNOSIS — C029 Malignant neoplasm of tongue, unspecified: Secondary | ICD-10-CM | POA: Diagnosis not present

## 2018-07-19 DIAGNOSIS — E119 Type 2 diabetes mellitus without complications: Secondary | ICD-10-CM | POA: Diagnosis not present

## 2018-07-19 DIAGNOSIS — H532 Diplopia: Secondary | ICD-10-CM | POA: Diagnosis not present

## 2018-07-19 DIAGNOSIS — I4891 Unspecified atrial fibrillation: Secondary | ICD-10-CM | POA: Diagnosis not present

## 2018-07-19 DIAGNOSIS — I10 Essential (primary) hypertension: Secondary | ICD-10-CM | POA: Diagnosis not present

## 2018-07-19 DIAGNOSIS — Z794 Long term (current) use of insulin: Secondary | ICD-10-CM | POA: Diagnosis not present

## 2018-07-19 DIAGNOSIS — I2510 Atherosclerotic heart disease of native coronary artery without angina pectoris: Secondary | ICD-10-CM | POA: Diagnosis not present

## 2018-07-19 DIAGNOSIS — Z87891 Personal history of nicotine dependence: Secondary | ICD-10-CM | POA: Diagnosis not present

## 2018-07-19 DIAGNOSIS — G4733 Obstructive sleep apnea (adult) (pediatric): Secondary | ICD-10-CM | POA: Diagnosis not present

## 2018-07-19 DIAGNOSIS — J449 Chronic obstructive pulmonary disease, unspecified: Secondary | ICD-10-CM | POA: Diagnosis not present

## 2018-07-20 DIAGNOSIS — I2510 Atherosclerotic heart disease of native coronary artery without angina pectoris: Secondary | ICD-10-CM | POA: Diagnosis not present

## 2018-07-20 DIAGNOSIS — J449 Chronic obstructive pulmonary disease, unspecified: Secondary | ICD-10-CM | POA: Diagnosis not present

## 2018-07-20 DIAGNOSIS — I4891 Unspecified atrial fibrillation: Secondary | ICD-10-CM | POA: Diagnosis not present

## 2018-07-20 DIAGNOSIS — E119 Type 2 diabetes mellitus without complications: Secondary | ICD-10-CM | POA: Diagnosis not present

## 2018-07-20 DIAGNOSIS — K5710 Diverticulosis of small intestine without perforation or abscess without bleeding: Secondary | ICD-10-CM | POA: Diagnosis not present

## 2018-07-20 DIAGNOSIS — H532 Diplopia: Secondary | ICD-10-CM | POA: Diagnosis not present

## 2018-07-20 DIAGNOSIS — K219 Gastro-esophageal reflux disease without esophagitis: Secondary | ICD-10-CM | POA: Diagnosis not present

## 2018-07-20 DIAGNOSIS — R9431 Abnormal electrocardiogram [ECG] [EKG]: Secondary | ICD-10-CM | POA: Diagnosis not present

## 2018-07-20 DIAGNOSIS — I10 Essential (primary) hypertension: Secondary | ICD-10-CM | POA: Diagnosis not present

## 2018-07-20 DIAGNOSIS — G4733 Obstructive sleep apnea (adult) (pediatric): Secondary | ICD-10-CM | POA: Diagnosis not present

## 2018-07-20 DIAGNOSIS — C029 Malignant neoplasm of tongue, unspecified: Secondary | ICD-10-CM | POA: Diagnosis not present

## 2018-07-20 MED ORDER — NIACIN ER (ANTIHYPERLIPIDEMIC) 500 MG PO TBCR
500.00 | EXTENDED_RELEASE_TABLET | ORAL | Status: DC
Start: 2018-07-20 — End: 2018-07-20

## 2018-07-20 MED ORDER — ALBUTEROL SULFATE HFA 108 (90 BASE) MCG/ACT IN AERS
1.00 | INHALATION_SPRAY | RESPIRATORY_TRACT | Status: DC
Start: ? — End: 2018-07-20

## 2018-07-20 MED ORDER — LIDOCAINE VISCOUS HCL 2 % MT SOLN
15.00 | OROMUCOSAL | Status: DC
Start: ? — End: 2018-07-20

## 2018-07-20 MED ORDER — LEVOTHYROXINE SODIUM 50 MCG PO TABS
50.00 | ORAL_TABLET | ORAL | Status: DC
Start: 2018-07-21 — End: 2018-07-20

## 2018-07-20 MED ORDER — FLUTICASONE-SALMETEROL 115-21 MCG/ACT IN AERO
2.00 | INHALATION_SPRAY | RESPIRATORY_TRACT | Status: DC
Start: 2018-07-20 — End: 2018-07-20

## 2018-07-20 MED ORDER — ENOXAPARIN SODIUM 40 MG/0.4ML ~~LOC~~ SOLN
40.00 | SUBCUTANEOUS | Status: DC
Start: 2018-07-21 — End: 2018-07-20

## 2018-07-20 MED ORDER — LOSARTAN POTASSIUM 50 MG PO TABS
50.00 | ORAL_TABLET | ORAL | Status: DC
Start: 2018-07-21 — End: 2018-07-20

## 2018-07-20 MED ORDER — FOLIC ACID 1 MG PO TABS
1.00 | ORAL_TABLET | ORAL | Status: DC
Start: 2018-07-21 — End: 2018-07-20

## 2018-07-20 MED ORDER — POLYETHYLENE GLYCOL 3350 17 G PO PACK
17.00 | PACK | ORAL | Status: DC
Start: 2018-07-21 — End: 2018-07-20

## 2018-07-20 MED ORDER — INSULIN LISPRO 100 UNIT/ML ~~LOC~~ SOLN
2.00 | SUBCUTANEOUS | Status: DC
Start: 2018-07-20 — End: 2018-07-20

## 2018-07-20 MED ORDER — NITROGLYCERIN 0.4 MG SL SUBL
0.40 | SUBLINGUAL_TABLET | SUBLINGUAL | Status: DC
Start: ? — End: 2018-07-20

## 2018-07-20 MED ORDER — ESCITALOPRAM OXALATE 5 MG PO TABS
5.00 | ORAL_TABLET | ORAL | Status: DC
Start: 2018-07-21 — End: 2018-07-20

## 2018-07-20 MED ORDER — ONDANSETRON HCL 8 MG PO TABS
8.00 | ORAL_TABLET | ORAL | Status: DC
Start: ? — End: 2018-07-20

## 2018-07-20 MED ORDER — METOPROLOL TARTRATE 25 MG PO TABS
25.00 | ORAL_TABLET | ORAL | Status: DC
Start: 2018-07-20 — End: 2018-07-20

## 2018-07-20 MED ORDER — ASPIRIN EC 81 MG PO TBEC
81.00 | DELAYED_RELEASE_TABLET | ORAL | Status: DC
Start: 2018-07-21 — End: 2018-07-20

## 2018-07-20 MED ORDER — TRAMADOL HCL 50 MG PO TABS
50.00 | ORAL_TABLET | ORAL | Status: DC
Start: ? — End: 2018-07-20

## 2018-07-20 MED ORDER — TAMSULOSIN HCL 0.4 MG PO CAPS
0.40 | ORAL_CAPSULE | ORAL | Status: DC
Start: 2018-07-21 — End: 2018-07-20

## 2018-07-20 MED ORDER — DEXTROSE 10 % IV SOLN
125.00 | INTRAVENOUS | Status: DC
Start: ? — End: 2018-07-20

## 2018-07-20 MED ORDER — OXYCODONE HCL 5 MG PO TABS
5.00 | ORAL_TABLET | ORAL | Status: DC
Start: ? — End: 2018-07-20

## 2018-07-20 MED ORDER — AYR SALINE NASAL NA GEL
1.00 | NASAL | Status: DC
Start: ? — End: 2018-07-20

## 2018-07-20 MED ORDER — PANTOPRAZOLE SODIUM 40 MG PO TBEC
40.00 | DELAYED_RELEASE_TABLET | ORAL | Status: DC
Start: 2018-07-21 — End: 2018-07-20

## 2018-07-25 ENCOUNTER — Encounter (HOSPITAL_COMMUNITY): Payer: Self-pay | Admitting: Obstetrics and Gynecology

## 2018-07-25 ENCOUNTER — Inpatient Hospital Stay: Payer: Medicare Other | Admitting: Adult Health

## 2018-07-25 ENCOUNTER — Other Ambulatory Visit: Payer: Self-pay

## 2018-07-25 ENCOUNTER — Emergency Department (HOSPITAL_COMMUNITY)
Admission: EM | Admit: 2018-07-25 | Discharge: 2018-07-25 | Disposition: A | Discharge status: Home / Self Care | Payer: Medicare Other | Attending: Emergency Medicine | Admitting: Emergency Medicine

## 2018-07-25 ENCOUNTER — Ambulatory Visit (INDEPENDENT_AMBULATORY_CARE_PROVIDER_SITE_OTHER): Payer: Medicare Other | Admitting: Pulmonary Disease

## 2018-07-25 ENCOUNTER — Encounter: Payer: Self-pay | Admitting: Pulmonary Disease

## 2018-07-25 VITALS — BP 126/70 | HR 89 | Temp 98.9°F | Ht 73.0 in | Wt 204.4 lb

## 2018-07-25 DIAGNOSIS — Z9049 Acquired absence of other specified parts of digestive tract: Secondary | ICD-10-CM | POA: Insufficient documentation

## 2018-07-25 DIAGNOSIS — Z85828 Personal history of other malignant neoplasm of skin: Secondary | ICD-10-CM | POA: Diagnosis not present

## 2018-07-25 DIAGNOSIS — Z87891 Personal history of nicotine dependence: Secondary | ICD-10-CM | POA: Insufficient documentation

## 2018-07-25 DIAGNOSIS — K3184 Gastroparesis: Secondary | ICD-10-CM

## 2018-07-25 DIAGNOSIS — J449 Chronic obstructive pulmonary disease, unspecified: Secondary | ICD-10-CM | POA: Insufficient documentation

## 2018-07-25 DIAGNOSIS — F419 Anxiety disorder, unspecified: Secondary | ICD-10-CM | POA: Diagnosis not present

## 2018-07-25 DIAGNOSIS — E782 Mixed hyperlipidemia: Secondary | ICD-10-CM | POA: Diagnosis not present

## 2018-07-25 DIAGNOSIS — N401 Enlarged prostate with lower urinary tract symptoms: Secondary | ICD-10-CM | POA: Diagnosis not present

## 2018-07-25 DIAGNOSIS — N400 Enlarged prostate without lower urinary tract symptoms: Secondary | ICD-10-CM | POA: Diagnosis not present

## 2018-07-25 DIAGNOSIS — Z79899 Other long term (current) drug therapy: Secondary | ICD-10-CM | POA: Diagnosis not present

## 2018-07-25 DIAGNOSIS — Z8701 Personal history of pneumonia (recurrent): Secondary | ICD-10-CM | POA: Diagnosis not present

## 2018-07-25 DIAGNOSIS — Z7982 Long term (current) use of aspirin: Secondary | ICD-10-CM | POA: Diagnosis not present

## 2018-07-25 DIAGNOSIS — J181 Lobar pneumonia, unspecified organism: Secondary | ICD-10-CM

## 2018-07-25 DIAGNOSIS — I4891 Unspecified atrial fibrillation: Secondary | ICD-10-CM | POA: Diagnosis not present

## 2018-07-25 DIAGNOSIS — Z85810 Personal history of malignant neoplasm of tongue: Secondary | ICD-10-CM | POA: Insufficient documentation

## 2018-07-25 DIAGNOSIS — R740 Nonspecific elevation of levels of transaminase and lactic acid dehydrogenase [LDH]: Secondary | ICD-10-CM | POA: Diagnosis not present

## 2018-07-25 DIAGNOSIS — J4489 Other specified chronic obstructive pulmonary disease: Secondary | ICD-10-CM

## 2018-07-25 DIAGNOSIS — R931 Abnormal findings on diagnostic imaging of heart and coronary circulation: Secondary | ICD-10-CM

## 2018-07-25 DIAGNOSIS — R7401 Elevation of levels of liver transaminase levels: Secondary | ICD-10-CM

## 2018-07-25 DIAGNOSIS — E119 Type 2 diabetes mellitus without complications: Secondary | ICD-10-CM

## 2018-07-25 DIAGNOSIS — I2510 Atherosclerotic heart disease of native coronary artery without angina pectoris: Secondary | ICD-10-CM

## 2018-07-25 DIAGNOSIS — F411 Generalized anxiety disorder: Secondary | ICD-10-CM

## 2018-07-25 DIAGNOSIS — Z7984 Long term (current) use of oral hypoglycemic drugs: Secondary | ICD-10-CM | POA: Diagnosis not present

## 2018-07-25 DIAGNOSIS — C029 Malignant neoplasm of tongue, unspecified: Secondary | ICD-10-CM | POA: Diagnosis not present

## 2018-07-25 DIAGNOSIS — R339 Retention of urine, unspecified: Secondary | ICD-10-CM | POA: Diagnosis not present

## 2018-07-25 DIAGNOSIS — J189 Pneumonia, unspecified organism: Secondary | ICD-10-CM

## 2018-07-25 DIAGNOSIS — R3914 Feeling of incomplete bladder emptying: Secondary | ICD-10-CM

## 2018-07-25 HISTORY — DX: Malignant (primary) neoplasm, unspecified: C80.1

## 2018-07-25 LAB — URINALYSIS, ROUTINE W REFLEX MICROSCOPIC
BILIRUBIN URINE: NEGATIVE
Glucose, UA: NEGATIVE mg/dL
KETONES UR: 20 mg/dL — AB
LEUKOCYTES UA: NEGATIVE
Nitrite: NEGATIVE
PROTEIN: 100 mg/dL — AB
SPECIFIC GRAVITY, URINE: 1.024 (ref 1.005–1.030)
pH: 5 (ref 5.0–8.0)

## 2018-07-25 LAB — COMPREHENSIVE METABOLIC PANEL
ALT: 281 U/L — ABNORMAL HIGH (ref 0–44)
AST: 635 U/L — ABNORMAL HIGH (ref 15–41)
Albumin: 3.2 g/dL — ABNORMAL LOW (ref 3.5–5.0)
Alkaline Phosphatase: 57 U/L (ref 38–126)
Anion gap: 7 (ref 5–15)
BUN: 16 mg/dL (ref 8–23)
CO2: 25 mmol/L (ref 22–32)
Calcium: 9 mg/dL (ref 8.9–10.3)
Chloride: 100 mmol/L (ref 98–111)
Creatinine, Ser: 0.58 mg/dL — ABNORMAL LOW (ref 0.61–1.24)
GFR calc Af Amer: >60 mL/min (ref >60)
GFR calc non Af Amer: >60 mL/min (ref >60)
Glucose, Bld: 118 mg/dL — ABNORMAL HIGH (ref 70–99)
Potassium: 4.8 mmol/L (ref 3.5–5.1)
Sodium: 132 mmol/L — ABNORMAL LOW (ref 135–145)
Total Bilirubin: 0.2 mg/dL — ABNORMAL LOW (ref 0.3–1.2)
Total Protein: 6 g/dL — ABNORMAL LOW (ref 6.5–8.1)

## 2018-07-25 LAB — CBC WITH DIFFERENTIAL/PLATELET
Abs Immature Granulocytes: 0.02 10*3/uL (ref 0.00–0.07)
Basophils Absolute: 0 10*3/uL (ref 0.0–0.1)
Basophils Relative: 1 %
Eosinophils Absolute: 0 10*3/uL (ref 0.0–0.5)
Eosinophils Relative: 1 %
HCT: 33.3 % — ABNORMAL LOW (ref 39.0–52.0)
Hemoglobin: 10.7 g/dL — ABNORMAL LOW (ref 13.0–17.0)
Immature Granulocytes: 1 %
Lymphocytes Relative: 14 %
Lymphs Abs: 0.5 10*3/uL — ABNORMAL LOW (ref 0.7–4.0)
MCH: 30.6 pg (ref 26.0–34.0)
MCHC: 32.1 g/dL (ref 30.0–36.0)
MCV: 95.1 fL (ref 80.0–100.0)
Monocytes Absolute: 0.3 10*3/uL (ref 0.1–1.0)
Monocytes Relative: 8 %
Neutro Abs: 2.8 10*3/uL (ref 1.7–7.7)
Neutrophils Relative %: 75 %
Platelets: 165 10*3/uL (ref 150–400)
RBC: 3.5 MIL/uL — ABNORMAL LOW (ref 4.22–5.81)
RDW: 13.5 % (ref 11.5–15.5)
WBC: 3.7 10*3/uL — ABNORMAL LOW (ref 4.0–10.5)
nRBC: 0 % (ref 0.0–0.2)

## 2018-07-25 MED ORDER — GLIMEPIRIDE 2 MG PO TABS: 2.0000 mg | ORAL_TABLET | Freq: Every day | ORAL | 3 refills | Status: DC

## 2018-07-25 MED ORDER — POLYETHYLENE GLYCOL 3350 17 GM/SCOOP PO POWD: 17.0000 g | Freq: Two times a day (BID) | ORAL | 3 refills | Status: AC

## 2018-07-25 MED ORDER — ATENOLOL 25 MG PO TABS: 25.0000 mg | ORAL_TABLET | Freq: Every day | ORAL | 3 refills | Status: AC

## 2018-07-25 MED ORDER — LIDOCAINE HCL URETHRAL/MUCOSAL 2 % EX PRSY
1.0000 "application " | PREFILLED_SYRINGE | Freq: Once | CUTANEOUS | Status: DC | PRN
  Filled 2018-07-25: qty 5

## 2018-07-25 MED ORDER — METFORMIN HCL 500 MG PO TABS: 500.0000 mg | ORAL_TABLET | Freq: Two times a day (BID) | ORAL | 3 refills | Status: AC

## 2018-07-25 MED ORDER — SODIUM CHLORIDE 0.9 % IV BOLUS
500.0000 mL | Freq: Once | INTRAVENOUS | Status: AC
  Administered 2018-07-25: 500 mL via INTRAVENOUS

## 2018-07-25 MED ORDER — GLUCOSE BLOOD VI STRP: ORAL_STRIP | 2 refills | Status: DC

## 2018-07-25 MED ORDER — NIACIN ER (ANTIHYPERLIPIDEMIC) 500 MG PO TBCR: 500.0000 mg | EXTENDED_RELEASE_TABLET | Freq: Every day | ORAL | 3 refills | Status: AC

## 2018-07-25 MED ORDER — FLUTICASONE-SALMETEROL 115-21 MCG/ACT IN AERO: 1.0000 | INHALATION_SPRAY | Freq: Two times a day (BID) | RESPIRATORY_TRACT | 11 refills | Status: AC

## 2018-07-25 MED ORDER — GLUCOSE BLOOD VI STRP: ORAL_STRIP | 2 refills | Status: AC

## 2018-07-25 MED ORDER — FLUTICASONE PROPIONATE 50 MCG/ACT NA SUSP: 2.0000 | Freq: Every day | NASAL | 11 refills | Status: AC | PRN

## 2018-07-25 MED ORDER — TAMSULOSIN HCL 0.4 MG PO CAPS: 0.4000 mg | ORAL_CAPSULE | Freq: Two times a day (BID) | ORAL | 3 refills | Status: AC

## 2018-07-25 MED ORDER — MORPHINE SULFATE (PF) 4 MG/ML IV SOLN
4.0000 mg | Freq: Once | INTRAVENOUS | Status: AC
  Administered 2018-07-25: 4 mg via INTRAVENOUS
  Filled 2018-07-25: qty 1

## 2018-07-25 MED ORDER — GLIMEPIRIDE 2 MG PO TABS: 2.0000 mg | ORAL_TABLET | Freq: Every day | ORAL | 3 refills | Status: AC

## 2018-07-25 MED ORDER — LOSARTAN POTASSIUM 50 MG PO TABS: 50.0000 mg | ORAL_TABLET | Freq: Every day | ORAL | 3 refills | Status: AC

## 2018-07-25 NOTE — ED Triage Notes (Signed)
Pt is a cancer pt and receiving active chemotherapy. Pt reports he has an appointment today to get chemo. Pt reports he has not peed in over 14 hours and is having abdominal pain and back pain. Pt's wife reports he has been having leg swelling.

## 2018-07-25 NOTE — Patient Instructions (Signed)
Today we updated your med list in our EPIC system...    Continue your current medications the same...  We reviewed your recent data from [facility] regarding your tongue cancer and the recent ER visits...  You are set for the planned follow up at [place] w/ DrPorosnicu...  You have a pending call from [place] for an appt regarding the foley cath & urinary retention...  You are also pending an appt w/ your eye doctors regarding your ophthalmoplegia (eye movement problem & double vision)...  We will arrange for a follow up appt w/ DrHochrein's team for the ATRIAL FIB...  We will also try to set you up for a Primary Care appt w/ DrWendling at [place] office.  Call for any questions or if I can be of service in any way.Marland KitchenMarland Kitchen

## 2018-07-25 NOTE — Progress Notes (Addendum)
Subjective:    Patient ID: Jesse Santos, male    DOB: 1935-09-29, 82 y.o.   MRN: [id number]  HPI 82 y/o WM w/ mult med problems here for a follow up visit>  ~  SEE PREV EPIC NOTES FOR OLDER DATA >>     LABS 1/14:  FLP- at goals on Niacin rx;  Chems- ok w/ BS=134, A1c=7.5;  CBC- wnl;  TSH=1.13;  VitD=42;  PSA=1.10      LABS 7/14:  Chems- ok x BS=140, A1c=7.3...   1/15> Jesse Santos reports that DrDuda plans a left THR (for avasc necreosis) whenever he is ready, but he's not rushing it, on Tramadol prn & doing satis for now...   LABS 1/15:  FLP- at goals on diet+Niaspan;  Chesm- ok w/ BS=129, A1c=7.0;  CBC- wnl;  TSH=1.06...  EKG 4/15 showed NSR, rate70, wnl, NAD...  Treadmill 6/15 (DrBrackbill) showed borderline ST segment changes w/ exercise & a run of atrial tachy reported...   Myoview 6/15 showed good exerc capacity, norm BP response, no CP, no evid ischemia, decr uptake inferiorly on rest images similar to 2013, EF=69%, norm wall motion...  LABS 7/15:  Chems- ok w/ BS=126, A1c=7.1.Marland KitchenMarland Kitchen rec to continue Metform500Bid & get on diet, incr exerc, get wt down!!!   ~  October 10, 2014:  59moROV & Jesse Santos indicates it took several wks to get over his URI/AB episode in Oct;      On Advair115-2spBid, Mucinex, flonase, claritin; he reports breathing is stable- min cough, no sput, stable DOE, no edema...     He remains on Niasp500/d; FLP 1/16 showed TChol 150, TG 101, HDL 45, LDL 85    He continues Metform500Bid for his DM; Labs 1/16 showed BS=133, A1c=7.2    He has some lower abd pain/ cramping & has used the Levsin w/ relief- c/w IBS, some diarrhea, nausea, groin discomfort; he had colonoscopy 2012 w/ 2 polyps removed & f/u planned 2017...     He sees DrDuda for Ortho & takes Tramadol50 aS NEEDED... We reviewed prob list, meds, xrays and labs> see below for updates >>   CXR 1/16 showed norm heart size, clear lungs, DJD in Tspine, NAD...  LABS 1/16:  FLP- at goals on Niasp;  Chems- ok w/ BS=133  A1c=7.2;  CBC- wnl;  TSH=1.59;  PSA=1.25...   ~  April 10, 2015:  645moOV & Jesse Santos reports stable w/ CC= arthritis pain in shoulders/ hips, he sees DrDuda, told he needs left THR but he is holding off, Tramadol helps;  We reviewed the following medical problems during today's office visit >>    HOH> he has bilat hearing aides...    AR> on allergy shots, OTC antihist, Saline, Flonase; gets occas bouts of sinusitis but overall improved & no recent infections reported...     OSA> Sleep Study was in 2/04 w/ RDI=45 & desat to 88% w/ mod snoring & leg jerks, but he states he never could use the CPAP effectively & states he sleeps fine now if he stays on his side; denies daytime sleepiness or other issues...    COPD> exsmoker quit 1981; on AdvairHFA115-2spBid, Mucinex prn; he doesn't like Pred Rx; he had Bronchitic exac treated 10/15 w/ Levaquin, Medrol, Mucinex, etc & improved; needs incr exercise program...    HBP> on Aten25, Losar100; BP=120/70, he denies CP, palpit, dizzy, SOB, edema, etc...    CAD> on ASA81; followed by DrHochrein- CT Abd 2010 showed coronary calcif, Treadmill was neg; Myoview 3/13 was neg,  wnl; last seen 4/15- no new symptoms, active w/ yard work, EKG showed wnl, they decided to do Treadmill (borderline ST depression), then Myoview 6/15 showed NEG- no ischemia, decr uptake inferiorly same as 2013, EF=69%, norm wall motion, low risk...    Hyperlipid> on Niacin'500mg'$ ; FLP 1/16 showed TChol 150, TG 101, HDL 45, LDL 85; continue same, get wt down...    DM> on Metform500Bid; wt=222#, BMI=29; Labs 7/16 showed BS=129, A1c=6.7; rec to continue same med, better diet, get wt down!..    GI- GERD, dysphagia, Divertics, IBS, Polyps> on Protonix40, Miralax, Levsin0.125 prn; followed by DrPerry & stable- last colon 5/12 w/ severe divertics & 2 sm adenomas removed...    GU- Kid stone, BPH> on Flomax0.4 & Cialis prn; PSA remains wnl (PSA 1/16= 1.25)...    DJD, LBP> left knee complaints w/ shots from DrDuda,  similar for left shoulder; hx bilat hip AVN noted on prev CT Abd but he has min complaints and uses Tylenol prn (holding off on surg)...    Anxiety> he does not want anxiolytic meds.[name] EXAM shows Afeb, VSS, O2sat=97%;  Heent- neg, mallampati2;  Chest- clear w/o w/r/r;  Heart- RR w/o m/r/g;  Abd- soft, neg, nontender;  Ext- neg w/o c/c/e;  Neuro- intact...  Labs 7/16>  Chems- ok w/ BS=129, A1c=6.7, Cr=0.83... IMP/PLAN>>  Jesse Santos is stable, meds refilled, he's already had the 2016 Flu vaccine & up to date;  We discussed diet/ exercise/ wt reduction... ROV in 16mo  ~  October 15, 2015:  640moOV & Jesse Santos saw TP 07/2015 for a lesion on tongue- sent to Derm DrLupton ?lichenoid mucositis (rx w/ several steroid shots & topical meds) & may need [facility]. He has mult somatic complaints> LBP, leg weakness, HAs, cough/ congestion/ clear mucous (yet he states exercise on treadmill for 3035mat 2.6mp30m.. We reviewed the following medical problems during today's office visit >>     HOH> he has bilat hearing aides...    AR> on allergy shots per DrVanWinkle, OTC antihist, Saline, Flonase; gets occas bouts of sinusitis but overall improved & no recent infections reported...     OSA> Sleep Study 2/04 w/ RDI=45 & desat to 88% w/ mod snoring & leg jerks, but he states he never could use the CPAP effectively & states he sleeps fine now if he stays on his side; denies daytime sleepiness or other issues...    COPD> exsmoker quit 1981; on AdvairHFA115-2spBid, Mucinex prn; he doesn't like Pred Rx; prev Bronchitic exac treated w/ Levaquin, Medrol, Mucinex & improved; needs incr exercise program...    HBP> on Aten25, Losar100; BP=126/70, he denies CP, palpit, dizzy, SOB, edema, etc...    CAD> on ASA81; followed by DrHochrein- CT Abd 2010 showed coronary calcif, Treadmill was neg; Myoview 3/13 was neg, wnl; last seen 4/15- no new symptoms, active w/ yard work, EKG showed wnl, they decided to do Treadmill (borderline ST  depression), then Myoview 6/15 showed NEG- no ischemia, decr uptake inferiorly same as 2013, EF=69%, norm wall motion, low risk...    Hyperlipid> on Niacin'500mg'$ ; FLP 1/17 showed TChol 132, TG 52, HDL 46, LDL 75; continue same, get wt down...    DM> on Metform500Bid; wt=220#, BMI=29; Labs 7/16 showed BS=129, A1c=6.7; rec to continue same med, better diet, get wt down; Labs 1/17 showed BS=134, A1c=6.8...  Marland KitchenMarland KitchenGI- GERD, dysphagia, Divertics, IBS, Polyps> on Protonix40, Miralax, Levsin0.125 prn; followed by DrPerry & stable- last colon 5/12 w/ severe divertics & 2 sm adenomas  removed...    GU- Kid stone, BPH> on Flomax0.4 & Cialis prn; PSA remains wnl (PSA 1/16= 1.25, 1/17=1.06)...    DJD, LBP> left knee complaints w/ shots from DrDuda, similar for left shoulder; hx bilat hip AVN noted on prev CT Abd but he has min complaints and uses Tylenol prn (holding off on surg)...    Anxiety> he does not want anxiolytic meds.[name] EXAM shows Afeb, VSS, O2sat=98%;  Heent- neg, mallampati2;  Chest- clear w/o w/r/r;  Heart- RR w/o m/r/g;  Abd- soft, neg, nontender;  Ext- neg w/o c/c/e;  Neuro- intact...  LABS 10/15/15>  FLP- at goals on diet + niacin;  Chems- ok x BS=134, A1c=6.8 on Metform500bid;  CBC- wnl;  TSH=1.08;  PSA=1.06 IMP/PLAN>>  Jesse Santos has mult somatic complaints but objectively in good shape; we discussed ENT 2nd opinion re tongue lesion vs Med Center referral per Derm, he will decide; continue present Rx & ROV 34mo..  ~  April 13, 2016:  667moOV and after the last visit Jesse Santos saw DrShoemaker, ENT 10/29/15 for the lesion on his tongue- his note is reviewed in Care Everywhere, he concurred w/ topical steroid therapy for this chronic tongue irritation & gave him Triamcinolone cream;  DrLupton [facility]16 7 note reviewed, bx proven lichenoid dermatitis, exam showed large erosions on lat border of tongue c/w oral erosive lichen planus; He rec Diflucan, Mycelex (for prob candida) &  Tacrolimus (Prograf) to dissolve in water & swish as directed=> pt indicates that he is improved, and they are planning f/u eval... we reviewed the following medical problems during today's office visit >>     HOH> he has bilat hearing aides & has been checked by DrShoemaker.    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    AR> on allergy shots per DrVanWinkle, OTC antihist, Saline, Flonase; gets occas bouts of sinusitis but overall improved & no recent infections reported...     OSA> Sleep Study 2/04 w/ RDI=45 & desat to 88% w/ mod snoring & leg jerks, but intol to CPAP & never used; states he sleeps fine now if he stays on his side; denies daytime sleepiness or other issues, naps 1/7...    COPD> exsmoker quit 1981; on AdvairHFA115-2spBid w/ spacer, Mucinex prn; he doesn't like Pred Rx; prev Bronchitic exac treated w/ antibiotics (Levaquin/ Augmentin), Medrol, Mucinex & improved; needs incr exercise program...    HBP> on Aten25, Losar100; BP=142/68, he denies CP, palpit, dizzy, SOB, edema, etc...    CAD> on ASA81; followed by DrHochrein- seen 12/19/15, CT Abd 2010 showed coronary calcif, Treadmill was neg (borderline ST depression); Myoview 3/13 was neg, wnl & Myoview 6/15 showed NEG- no ischemia, decr uptake inferiorly same as 2013, EF=69%, norm wall motion, low risk... He denies CP, palpit, dizzy, SOB, edema, etc; he remains active w/ yard work etc...    Hyperlipid> on Niacin50026mFLP 1/17 showed TChol 132, TG 52, HDL 46, LDL 75; continue same, get wt down...    DM> on Metform500Bid; wt=218#, BMI=29; Labs 7/16 showed BS=129, A1c=6.7; rec to continue same med, better diet, get wt down; Labs 1/17 showed BS=134, A1c=6.8 and A1c 04/13/16= 6.7, stable...    GI- GERD, dysphagia, Divertics, IBS, Polyps> on Protonix40, Miralax, Levsin0.125 prn; followed by DrPerry & stable- last colon 5/12 w/ severe divertics & 2 sm adenomas  removed...    GU- Kid stone, BPH> on Flomax0.4 &  Cialis prn; PSA remains wnl (PSA 1/16= 1.25, 1/17=1.06)...    DJD, LBP> left knee complaints w/ shots from DrDuda, similar for left shoulder; hx bilat hip AVN noted on prev CT Abd but he has min complaints and uses Tylenol prn (holding off on surg)...    Anxiety> he does not want anxiolytic meds.[name] EXAM shows Afeb, VSS, O2sat=98%;  Heent- neg, mallampati2;  Chest- clear w/o w/r/r;  Heart- RR w/o m/r/g;  Abd- soft, neg, nontender;  Ext- neg w/o c/c/e;  Neuro- intact...  LABS 04/13/16:  BMet- ordered but not done;  A1c=6.7.Marland KitchenMarland Kitchen IMP/PLAN>>  Jesse Santos is stable w/ his mult medical issues as above- oral lichen planus improved on regimen from Derm at [facility]; BP controlled, he remains active w/o angina, FLP & DM well regualted=> continue same meds.  ~  October 15, 2016:  41moROV & Jesse Santos was seen by SG 07/2016 & TP 08/2016 w/ cough prod of green mucus, sinus congestion, but no f/c/s; he was placed on MTX by DrJorizzo, [facility] due to his tongue lesion felt to be oral lichen planus, 2=> incr to 3 tabs once per week along w/ Folate, Mycelex and Prograf (1 cap dissolved in water & swished);  He remains on AdvairHFA-2spBid & he's careful to rinse after these treatments too;  He was treated w/ Levaquin + Pred taper and he followed up w/ TP 09/08/16-- improved w/ decr cough & dyspnea, no wheezing, remains on Advair, flonase, claritin, mucinex... we reviewed the following medical problems during today's office visit >>     HOH> he has bilat hearing aides & has been checked by DrShoemaker.    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    AR> on allergy shots per DrVanWinkle, OTC antihist, Saline, Flonase; gets occas bouts of sinusitis but overall improved & no recent infections reported...     OSA> Sleep Study 2/04 w/ RDI=45 & desat to 88% w/ mod  snoring & leg jerks, but intol to CPAP & never used; states he sleeps fine now if he stays on his side; denies daytime sleepiness or other issues, naps 1/7...    COPD> exsmoker quit 1981; on AdvairHFA115-2spBid w/ spacer, Mucinex prn; he doesn't like Pred Rx; prev Bronchitic exac treated w/ antibiotics (Levaquin/ Augmentin), Medrol, Mucinex & improved; needs incr exercise program...    HBP> on Aten25, Losar100; BP=116/60, he denies CP, palpit, dizzy, SOB, edema, etc...    CAD> on ASA81; followed by DrHochrein- seen 12/19/15, CT Abd 2010 showed coronary calcif, Treadmill was neg (borderline ST depression); Myoview 3/13 was neg, wnl & Myoview 6/15 showed NEG- no ischemia, decr uptake inferiorly same as 2013, EF=69%, norm wall motion, low risk... He denies CP, palpit, dizzy, SOB, edema, etc; he remains active w/ yard work etc...    Hyperlipid> on Niacin5039m FLP 1/17 showed TChol 132, TG 52, HDL 46, LDL 75; continue same, get wt down...    DM> on Metform500Bid; wt=216#, BMI=29; Labs 1/17 showed BS=134, A1c=6.8; rec to continue same med, better diet, get wt down; Labs 2/18 showed BS=136, A1c=6.7; stable...    GI- GERD, dysphagia, Divertics, IBS, Polyps> on Protonix40, Miralax, Levsin0.125 prn; followed by DrPerry & stable- last colon 5/12 w/ severe divertics & 2 sm adenomas removed; he wants off PPI=> change to PEPCID40...    GU- Kid stone, BPH> on Flomax0.4 & Cialis prn; PSA remains wnl (PSA 1/16= 1.25, 1/17=1.06, 2/18=1.45)...Marland KitchenMarland Kitchen  DJD, LBP> left knee complaints w/ shots from DrDuda, similar for left shoulder; hx bilat hip AVN noted on prev CT Abd but he has min complaints and uses Tylenol prn (holding off on surg)...    Anxiety> he does not want anxiolytic meds.[name] EXAM shows Afeb, VSS, O2sat=99%;  Wt=216#, 6'2"Tall, BMI=28;  Heent- neg, mallampati2;  Chest- clear w/o w/r/r;  Heart- RR w/o m/r/g;  Abd- soft, neg, nontender;  Ext- neg w/o c/c/e;  Neuro- intact...  CXR 08/10/16>  Norm heart size, atherosclerotic  Ao, clear lungs- NAD, degen changes in Tspine w/ some scoliosis...  LABS 10/15/16>  FLP- at goals on Niaspan;  Chems- ok w/ BS=136, A1c=6.7;  CBC- wnl w/ Hg=13.9, Fe=169 (42%sat), B12=504;  TSH=1.04,  PSA=1.45...  IMP/PLAN>>  Jesse Santos is still dealing w/ his oral prob= Lichen Planus & improved w/ treatment program under the direction of [facility]- on MTX 3/wk, Prograf, Mycelex;  His wife is concerned about his being on Protonix40 for many yrs, followed by DrPerry w/ GERD/ dysphagia, last EGD 2009 was essentially normal- OK to stop the PPI & change to Pepcid40;  Continue other meds the same + diet/ exercise...   ~  November 18, 2016:  38moROV & post hospital visit> DMarden Noblewas hospitalized 3/1 - 11/15/16 by [facility] w/ multilobar community acquired pneumonia (predom lingular w/ CT abn in LLL & RLL as well);  He woke w/ cough, malaise, myalgias, & chills;  Temp in ER was 103, BP was wnl, O2sat was 94% on 2L/min, CXR showed LLL pneumonia, no organism specified, treated w/ rocephin & zithromax=> disch on oral zithromax & medrol taper...     Currently feeling better- sl sore throat, cough, sm amt beige sput, no hemoptysis, +SOB/DOE & still "weak", some chest discomfort=> we decided to f/u CXR/ Labs (see below)...    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    AR> on allergy shots per DrVanWinkle, OTC antihist, Saline, Flonase; gets occas bouts of sinusitis but overall improved & no recent infections reported...     OSA> Sleep Study 2/04 w/ RDI=45 & desat to 88% w/ mod snoring & leg jerks, but intol to CPAP & never used; states he sleeps fine now if he stays on his side; denies daytime sleepiness or other issues, naps 1/7...    COPD> exsmoker quit 1981; on AdvairHFA115-2spBid w/ spacer, Mucinex prn; he doesn't like Pred Rx; prev Bronchitic exac treated w/ antibiotics (Levaquin/ Augmentin),  Medrol, Mucinex & improved; needs incr exercise program... EXAM shows Afeb, VSS, O2sat=99%;  Wt=216#, 6'2"Tall, BMI=28;  Heent- neg, mallampati2;  Chest- clear w/o w/r/r;  Heart- RR w/o m/r/g;  Abd- soft, neg, nontender;  Ext- neg w/o c/c/e;  Neuro- intact...  CXR 11/12/16 showed LLL pneumonia, borderline cardiomegaly, aortic calcif  CT Chest 11/13/16>  Neg for PE, multilobar left lung pneumonia, poss early RLL infiltrate, calcif aortic & coronary arteries...  CXR today 11/18/16 showed norm heart size, aortic atherosclerosis, left lung opacity is resolving/ improved, no effusion etc...  LABS 11/2016>  Chems- ok x BS 110-190;  CBC- ok x WBC 14K=>9K  LABS 11/18/16>  Chems- ok w/ BS=110;  CBC- improved w/ Hg=13.7, WBC=8.5 IMP/PLAN>>  DMarden Noblehas improved from his recent bout of pneumonia (no organism identified); he has finished the antibiotics (Zithromax), & weaning off the Medrol;  We plan ROV recheck in 3-4weeks...   ~  December 09, 2016:  3wk [place] is improved overall "just weak" he says;  BP is good at home on Losar50 reading 130-140/ 60-70 at home;  He denis much cough, sput, no hemoptysis, no CP, SOB improved & now active on his treadmill, doing housework, etc;  His CC is nasal drainage;  F/u CXR today is resolved- NAD...     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    AR> on allergy shots per DrVanWinkle, OTC antihist, Saline, Flonase; gets occas bouts of sinusitis but overall improved & no recent infections reported...     OSA> Sleep Study 2/04 w/ RDI=45 & desat to 88% w/ mod snoring & leg jerks, but intol to CPAP & never used; states he sleeps fine now if he stays on his side; denies daytime sleepiness or other issues, naps 1/7...    COPD> exsmoker quit 1981; on AdvairHFA115-2spBid w/ spacer, Mucinex prn; he doesn't like Pred Rx; prev Bronchitic exac treated w/  antibiotics (Levaquin/ Augmentin), Medrol, Mucinex & improved; needs incr exercise program...    Medical Issues>  HBP, CAD, HL, DM, Gastroparesis, BPH, DJD, anxiety... EXAM shows Afeb, VSS, O2sat=98%;  Wt=211#, 6'2"Tall, BMI=28;  Heent- neg, mallampati2;  Chest- clear w/o w/r/r;  Heart- RR w/o m/r/g;  Abd- soft, neg, nontender;  Ext- neg w/o c/c/e;  Neuro- intact...  CXR 12/09/16 (independently reviewed by me in the PACS system) showed norm heart size, clear lungs- NAD... IMP/PLAN>>  Jesse Santos's pneumonia has resolved & he is approaching his baseline "just weak" he says;  Rec to continue his baseline regimen, diet, exercise, etc...   ~  April 14, 2017:  58moROV & Jesse Santos reports that his Lichen Planus (tongue soreness) is improved and [facility]  He notes that his H2-blocker Pepcid isn't enough for his reflux & feels he needs to go back on Protonix40- ok (take it 360m before dinner);  Breathing is good but it is "dusty at my place" and this bothers his sinuses> prev on allergy shots for 22 yrs and DrESL stopped them when he retired, still uses ClNordstromdenies SOB, CP, etc...  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    AR> prev on allergy shots per DrESL, OTC antihist, Saline, Flonase; gets occas bouts of sinusitis but overall improved but he's concerned not doing as well off the shots- asked to check in w/ DrVanWinkle.    OSA> Sleep Study 2/04 w/ RDI=45 & desat to 88% w/ mod snoring & leg jerks, but intol to CPAP & never used; states he sleeps fine now if he stays on his side; denies daytime sleepiness or other issues, naps 1/7...    COPD> exsmoker quit 1981; on AdvairHFA115-2spBid w/ spacer, Mucinex prn; he doesn't like Pred Rx; prev Bronchitic exac  treated w/ antibiotics (Levaquin/ Augmentin), Medrol, Mucinex & improved; needs incr exercise program...    Medical Issues>  HBP (controlled), CAD (DrHochrein last 4/17), HL (on Niaspan, FLP ok 2/18), DM (on Metform, Labs 2/18 w/ BS-167 A1c-6.7) , Gastroparesis, BPH, DJD, anxiety... EXAM shows Afeb, VSS, O2sat=99%;  Wt=215#, 6'2"Tall, BMI=28;  Heent- neg, mallampati2;  Chest- clear w/o w/r/r;  Heart- RR w/o m/r/g;  Abd- soft, neg, nontender;  Ext- neg w/o c/c/e;  Neuro- intact... IMP/PLAN>>  OK back on Protonix40 taken 80mn before dinner;  Continue same meds and check back w/ DrVanWinkle regarding allergy shots;  OK- refills today & we reviewed diet & exercise...  ~  May 27, 2017:  6[place]DrDuda for back pain & right hip pain, XRays showed arthritis; Tramadol provided some temporary relief, they decided to try a course of Pred w/ rov to assess for MRI & need for surg... Pt called last wk stating that he's seen DrVanWinkle w/ sinus symptoms and was given Cefdinir x10d but he reports only min better; also on the Pred from DrDuda; still c/o sinus congestion, pressure, light yellow mucus draining, still using Mucinex, Claritin, Flonase & now reporting chest tightness & some wheezing; he states "I can't clear my sinuses", denies f/c/s but has drainage/ blowing/ coughing...    EXAM shows Afeb, VSS, O2sat=97%;  Wt=215#;  Heent- +cerumen impactions, +sinus congestion, mallampati2;  Chest- clear w/o w/r/r;  Heart- RR w/o m/r/g;  Abd- soft, neg, nontender;  Ext- neg w/o c/c/e;  Neuro- intact..  CXR 05/27/17 (independently reviewed by me in the PACS system)> norm heart size, sl pleural thickening, sl incr markings- NAD...  LABS 05/27/17 showed Chems- wnl x BS=171, CBC- Hg=13.1, WBC=11K;  Sed=16...  Sputum C&S 05/27/17> NEG- NTF only IMP/PLAN>>  DMarden Noblehas had a long hx recurrent sinusitis & allergies [facility]; now off shots & having more  difficulty; he has seen DrVanWinkle in follow up;  We discussed Rx w/ Levaquin '750mg'$ /d x10d + Align & Activia;  Given Depo120 & continue Pred taper from DrDuda; continue Mucinex, Flonase, Claritin, [place] and f/u w/ ENT DrShoemaker for cerumen/ hearing aides/ sinusitis...  ADDENDUM>>  Sput from 05/27/17 was also set up for Fungal smear&culture> lab now reports smear pos for fungal elements, and cult shows Aspergillus flavus, Aspergillus fumigatus, Cryptococcus laurentii, & Bipolaris species;  He is on chr therapy [facility] (3/wk), Prograf (oral swish), Mycelex (troches), & Folate;  He has had mult sinus infections treated periodically w/ Levaquin, Augmentin, etc;  He also gets freq courses for Pred [place] and from Ortho for back & right hip pain... I do not feel that these 4 agents are causing dis in his sinuses or lungs- suspect oral contam?  Plan is to try a course of MMW & repeat Sputum Fungal culture=> discussed w/ pt.  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   ~  October 18, 2017:  4-538moOV & after last OV pt saw ID-DrCampbell on 07/13/17>  2 recent sput cultures + for various fungi on cultures; on immunosuppressive rx for lichen planus; however there was no evid for active fungal infection- no pneumonia, sinusitis, or otitis; no further testing or treatment needed; asked to ret prn...    He had ORTHO f/u- DrDuda 06/03/17>  Prev right sided radicular pain & he reports resolved on his recent antibiotics + Pred for sinuses; asked to ret prn...    [facility]  Diabetic eye exam, s/p bilat cat surg w/ lens implants, no retinopathy    He  saw DERM-DrJorizzo [facility] 86/57/84>  Oral lichen planus on Mycelex troche one dissolved in mouth daily,  Diflucan100-one tab weekly, Prograf'1mg'$  cap dissolved in water & swish as directed, MTX2.6-9GEXB/MW, Folic'1mg'$ /d;  Improved since MTX added, tolerating meds, still some burning, & they saw oral candida (Rx Diflucan/ Mycelex)...       Pt called 10/08/17 c/o nasal drainage- yellow green + some diarrhea, fatigue, body aches; we called in Flagyl '250mg'$ Tid x7d; he has mult somatic complaints that he wants to relate to his time in [place]; wonders if he had food poisoning w/ recent diarrhea; under a lot of stress w/ tree damage on his property- offered Ativan rx '1mg'$ - 1/2 to 1 tab Bid...  We reviewed the following medical problems during today's office visit >>     Oral lesion> lichenoid mucositis, oral erosive lichen planus- eval by DrLupton, DrShoemaker, DrJorizzo at [employer] [employer] & now improved on MTX, folate, Mycelex, Prograf swish from [facility]- improved.    AR> prev on allergy shots per DrESL, OTC antihist, Saline, Flonase; gets occas bouts of sinusitis but overall improved but he's concerned not doing as well off the shots- asked to check in w/ DrVanWinkle.    OSA> Sleep Study 2/04 w/ RDI=45 & desat to 88% w/ mod snoring & leg jerks, but intol to CPAP & never used; states he sleeps fine now if he stays on his side; denies daytime sleepiness or other issues, naps 1/7...    COPD> exsmoker quit 1981; on AdvairHFA115-2spBid w/ spacer, Mucinex prn; he doesn't like Pred Rx; prev Bronchitic exac treated w/ antibiotics (Levaquin/ Augmentin), Medrol, Mucinex & improved; needs incr exercise program...    Medical Issues>  HBP (controlled), CAD (DrHochrein last 4/17), HL (on Niaspan, FLP ok 2/18), DM (on Metform, Labs 2/19 w/ BS=140 A1c=7.2, adding Glimep2) , Gastroparesis, BPH, DJD- Rhip & back pain, anxiety... EXAM shows Afeb, VSS, O2sat=99%;  Wt=215#, 6'2"Tall, BMI=28;  Heent- neg, mallampati2;  Chest- clear w/o w/r/r;  Heart- RR w/o m/r/g;  Abd- soft, neg, nontender;  Ext- neg w/o c/c/e;  Neuro- intact...  LABS 07/2017 at [facility]   Chems- ok w/ BS=156, Cr=0.84, LFTs wnl;  CBC- ok w/ Hg=12.9, wbc=6.2.Marland KitchenMarland Kitchen  LABS 10/18/17>  FLP- at goals on Niacin500;  Chems- ok x BS=140, A1c=7.2 on Metform500Bid- ADD GLIMEP'2mg'$ /d;  CBC- ok w/ Hg=12.8, wbc=6.4;  TSH=0.76;  PSA=2.18... IMP/PLAN>>  FLP is ok on Niaspan500, continue same;  DM control not as good w/ BS=140 & A1c=7.2 on Metform500bid, rec adding glimep'2mg'$ Qam; he will maintain f/u w/ D[facility]iz[facility]; otherw continue same meds and try to avoid infections, may need to get back on allergy shots...   ~  December 09, 2017:  19moROV & add-on appt for recurrent sinusitis> Pt presents c/o 2wk hx sinus congestion/ pressure, drainage & blowing out yellow/green mucus, denies fever but feel chiily & aching/sore/ mult somatic complaints;  He's been taking Claritin, Flonase, Mucinex, and Ocean nasal mist;  He has a hx of recurrent sinus infections & we discussed referral to ENT for additional eval... We reviewed his pulm/ medical problems as listed above...    EXAM shows Afeb, VSS, O2sat=99%;  Wt=222#, 6'2"Tall, BMI=29;  Heent- +sinus congestion, erythema, & max sinus tenderness, mallampati2;  Chest- clear w/o w/r/r;  Heart- RR w/o m/r/g;  Abd- soft, neg, nontender;  Ext- neg w/o c/c/e;  Neuro- intact... IMP/PLAN>>  DMarden Noblehas yet another sinus infection & we will treat w/ Levaquin '500mg'$ x10d and Depo80 + Pred '20mg'$  tabs- tapering sched (see AVS);  OK  to continue the [place], [place], & Mucinex as before;  We will arrange for the ENT appt...   ~  April 21, 2018:  71moROV & Jesse Santos reports that his breathing is good and his CC revolves around his oral mucosal LICHEN PLANUS evaluated & treated by DrJorrizo, DCairocurrently on MTX, Pred, & Tacrolimus, but pt reports that he wants to use Mycophenolate (Cellcept) but that the drug was not approved by his insurance company for this indication (I researched GoodRx and generic Mycophenolate '500mg'$  tabs cost as low as $42 for 120 tabs) We reviewed the following interval  [facility] [place] [facility] [place]>     He saw ENT- DrShoemaker on 12/20/17> hx acute recurrent sinusitis, dev septum, AR, lichen planus, hearing loss- wears hearing aides; Note reviewed- DrShoemaker noted he wears nightly humidified CPAP & was currently stable (but he tells me he has never used the CPAP); he had dev septum & inferior turbinate hypertrophy; he was improved after the Levaquin 7 rec to continue the antihist, saline mist, Flonase qhs and call if symptoms worsening for CT & further eval as needed...     [facility] c/o weakness of 2wks duration, esp legs, & had 1 fall; Note reviewed- ROS otherw neg, EXAM was otherw unremarkable & no focal abn:  LABS & CXR OK- see below; pt reassured...     He saw DERM- [facility]Jorizzo- [facility] on 03/08/18>  Followed for oral Lichen Planus & oral candidiasis- on MTX 2.'5mg'$ -3/wk, Folic'1mg'$ /d, Pred2.'5mg'$ Bid, Diflucan100-tapering sched, Mycelex troches daily, Tacrolimus- 1cap dissolved in water & swished as directed;  Note reviewed, no changes made in therapy... We reviewed the following medical problems during today's office visit >>     Oral lesion> lichenoid mucositis, oral erosive lichen planus- eval by DrLupton, DrShoemaker, [facility]orizzo at [facility]& now improved on MTX, folate, Pred, Mycelex[facility]  Pt reports that DrJorizzo wants to use Mycophenolate (Cellcept) but his insurance won't approve this med for this indication- I checked GoodRx and generic Mycophenolate '500mg'$  tabs cost as low as $42 for 120 tabs.    AR> prev on allergy shots per DrESL, OTC antihist, Saline, Flonase; gets occas bouts of sinusitis but overall improved but he's concerned not doing as well off the shots- asked to check in w/ DrVanWinkle; he saw DrShoemaker for ENT 12/2017...    OSA> Sleep Study 2/04 w/ RDI=45 & desat to 88% w/ mod snoring & leg jerks, but intol to CPAP & never used; states he sleeps fine now if he stays on his side; denies daytime  sleepiness or other issues, naps 1/7...    COPD> exsmoker quit 1981; on AdvairHFA115-2spBid w/ spacer, Mucinex prn; he doesn't like Pred Rx; prev Bronchitic exac treated w/ antibiotics (Levaquin/ Augmentin), Medrol, Mucinex & improved; needs incr exercise program...    Medical Issues>  HBP (controlled on Aten/ Losar), CAD (DrHochrein last 4/17), HL (on Niaspan, FLP ok 2/18), DM (on Metform/ Glimep, Labs 8/19 w/ BS=133 A1c=6.8) , Gastroparesis (on Protonix), BPH (on Flomax), DJD- Rhip & back pain (he needs a BMD eval), anxiety (not on meds)... EXAM shows Afeb, VSS, O2sat=99%;  Wt=218#, 6'2"Tall, BMI=28;  Heent- neg, mallampati2;  Chest- clear w/o w/r/r;  Heart- RR w/o m/r/g;  Abd- soft, neg, nontender;  Ext- neg w/o c/c/e;  Neuro- intact...  CXR 02/27/18>  Norm heart size, aortic atherosclerosis, clear lungs, NAD..Marland KitchenMarland Kitchen EKG 02/27/18>  NSR rate69, +PVCs, otherw wnl- NAD...  LABS  02/27/18>  Chems- ok w/ K=4.2, BS=124, Cr=0.88, LFTs wnl;  CBC- ok w/ Hg=12.8, WBC=4.5;  CPK=75;  B12=474;  UA- clear...  LABS 04/21/18>  Chems- ok w K=4.1, BS=133, Cr=0.86;  A1c=6.8 on Metform500Bid & Glimep'2mg'$ ... IMP/PLAN>>  Jesse Santos is clinically stable but has a rough time w/ the Lichen Planus- eval & rx from DrJorizzo, D[facility];  They are considering the Mycophenolate & I reported to him the cash-pay price thru GoodRx;  He feels sl depressed but declines antidepressant & doesn't want more meds;  He requests a handicap parking permit (form completed for pt);  He will continue current meds and planned f/u visits;  He needs a baseline BMD on the chr Pred rx- pending...   ~  ADDENDUM >> BMD done 05/06/18 showed lowest Tscore = -0.9 in Left femoral neck & this is WNL- no osteopenia identified...  ~  ADDENDUM >> Pt saw DERM[facility]izzo[facility] on 0/34/91>  F/u oral lichen planus on MTX+ oral tacrolimus swish&spit + clotrimazole troches; overall improved but erosion on left lat tongue persists=> rec for repeat tongue bx... ~  ADDENDUM >> Pt saw  ENT- DrShoemaker on 05/09/18>  Note reviewed- prev bx by DrLupton [id number]=> benign lichen planus; EXAM showed erythematous lesion left lat tongue, no ulcer or bleeding, norm tongue mobility, floor of mouth ok, no cervical adenopathy palp; Plan is for re-biopsy to r/o malig degeneration... ~  ADDENDUM >> Pt had Bx left side of tongue lesion on 05/18/18 by DrShoemaker> PATH: FINAL PATHOLOGIC DIAGNOSIS MICROSCOPIC EXAMINATION AND DIAGNOSIS "TONGUE LESION", BIOPSY=Invasive squamous cell carcinoma, moderately differentiated.  Note: p16 immunostain is negative (<60% positive tumor cells). ~  ADDENDUM >> CT Scan NECK on 05/31/18 showed> IMPRESSION: Left anterior tongue mass with extensive submucosal component reaching the midline, 5 x 2.5 cm on axial slices. There are 2 ipsilateral metastatic nodes measuring up to 15 mm. Atherosclerotic calcif, bilat cataract resections, Cspine ankylosis at C3-4 & C4-5 w/ diffuse degen dis...  ~  ADDENDUM >> PET Scan 06/10/18 @ [place] showed hypermetabolic left anterior tongue mass in keeping with known squamous cell carcinoma; hypermetabolic left level 1B and left level 3 lymph nodes, suspicious for nodal metastases.  His [facility] team includes:  ENT- DrWaltonen,  Oncology- DrPorosnicu,  XRT- DrFrizzell ~  07/05/18> Started on chemotherapy w/ weekly paclitaxel/ carboplatin & Q3wk pembrolizumab ~  07/19/18> Developed sudden diplopia while driving to chemoRx appt;  Eye movements were said to be wnl;  Adm for observation-- CT Brain & subseq MRI showed: 1) Unremarkable MR appearance of the orbits.  2) No acute intracranial abnormality with background of chronic senescent changes as above.  Found new onset AFib> 2DEcho reviewed, norm LVF, no signif LA/RA enlargement or signif valve abn;  meds adjusted... 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  He appears to be going downhill rapidly since the institution of chemotherapy for his stage4 tongue cancer, he has lost 14#, notes it is difficult to swallow- [facility]  ~  We  reviewed the extensive interval records avail in [place] from [facility]- clinical notes, XRays, Scans, Labs... 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  [facility]>  Chems- ok x elev LFTs, A1c=6.4;  Anemic w/ Hg~11, WBC~3.3, Sed=20;  TSH=0.67; IMP/PLAN>>  Mr. Westrup is deteriorating rapidly- Stage4 oral cancer on chemotherapy & immunotherapy w/ mult side effects & complications; he is encouraged to f/u ASAP at [facility], he indicates that he has appt tomorrow.  NOTE:  >50% of this 23mn rov was spent in counseling & coordination of care...          Problem List:  HEARING LOSS (ICD-389.9) - he has bilat hearing aides...  ORAL Lichen Planus- [facility] Squamous Cell Ca of tongue dx 05/2018 w/ repeat bx & further eval by [facility] > ENT- DrWaltonen,  Oncology- DrPorosnicu,  XRT- DrFrizzell  ALLERGIC RHINITIS (ICD-477.9) - on allergy shots from DrESL (Gaetano Hawthorne... plus Claritin, Saline, Mucinex, Flonase. ~  occas bouts of sinusitis requiring antibiotic Rx... ~  8/13: he had allergy f/u DrVanWinkle> skin testing 2010 pos to dust mites and molds; doing well on shots once/mo; stable & no changes made... ~  3/14: he presented w/ an upper resp infection & treated w/ Depo, Dosepak,  Zithromax... ~  8/14:  He had allergy f/u DrVanWinkle> Advair, Flonase, Saline, Mucinex; he checked FENO- reported normal... ~  7/15: stable on allergy meds from DrVanWinkle, plus AdvairHFA115; he had sinus/ AB exac 5/15 treated by TP w/ ZPak=> Levaquin & resolved...  OBSTRUCTIVE SLEEP APNEA (ICD-327.23) - sleep study 8/04 showed RDI= 45 & desat to 88%... mod snoring and leg jerks without sleep disruption... CPAP perscribed but not using it now- states he can't sleep w/ it on & furthermore he is resting well as long as he stays on his side... denies daytime hypersomnolence & not interested in re-evaluation.  COPD (ICD-496) - ex-smoker, quit 1981 >>  ~  on ADVAIR 100Bid, PROAIR Prn, MUCINEX Bid... he had Pneumovax in 2008... doesn't like Pred Rx...  ~  10/11:  doing well except recent cough, min green sputum (no change in dyspnea)- we will Rx w/ Augmentin, incr Mucinex/ Fluids. ~  4/12:  Breathing at baseline, continue maintenance meds... ~  CXR 12/13 showed normal heart size, clear lungs, NAD... ~  1/14:  He's had 2 exac this past yr- saw TP w/ Rx & improved... ~  1/15:  He continues to do well on Advair + allergy Rx from DrVanWinkle w/ Flonase, Saline, Mucinex... ~  7/15: exsmoker quit 1981; on AdvairHFA115-2spBid, Mucinex prn; he doesn't like Pred Rx; he had Bronchitic exac treated by TP 5/15 w/ ZPak, then Levaquin, Mucinex, etc & improved; needs incr exercise program... ~  10/15: seen w/ refractory bronchitic episode reqiring ZPak=>Levaquin, Medrol, Mucinex, Hydromet, etc... ~  1/16: He had a URI/ AB exac treated 10/15 w/ Levaquin/ Medrol/ Mucinex & resolved; now at baseline on Advair115-2spBid etc... ~  11/2016> he developed a LLL pneumonia (nos) & resolved w/ Rocephin/ Zithromax, Medrol, Nebs, etc...  HYPERTENSION (ICD-401.9) - prev on diet alone> then on Diovan320, but we decided to change to LOSARTAN '100mg'$ /d 5/13 to save $$ ~  He claims that Norvasc & HCTZ "stopped my urine flow"... ~  10/11:  BP= 128/80 & even better at home he says... denies HA, fatigue, visual changes, CP, palipit, dizziness, syncope, dyspnea, edema, etc... ~  4/12:  Pt has seen TP, DrHochrein, & checking BP daily at home; tried on Norvasc but claims it decr his urine flow; now on Diovan & improved w/ BP= 132/70... ~  5/12:  BP remains well controlled on Diovan '320mg'$ /d= 120/62 today. ~  11/12:  BP= 142/82 on Diovan160 now; denies CP, palpit, dizzy, SOB, edema... ~  5/13:  BP= 128/66 & he's requesting change to cheaper med; rec switch Diovan to LOSARTAN '100mg'$ /d... ~  1/14: on Losar100; BP=140/84, he notes incr HR he says & we decided to add low dose ATENOLOL'25mg'$ /d... ~  7/14: on Aten25, Losar100> BP=128/72 & he denies CP, palpit, SOB, edema, etc... ~  1/15: BP controlled on Aten25, Losar100 w/ BP= 130/80 & he denies CP, palpit, SOB, edema. ~  7/15: on Aten25, Losar100; BP=124/70, he denies CP, palpit, dizzy, SOB, edema, etc. ~  10/15: on Aten25, Losar100; BP= 140/60 & remains stable hemodynamically... ~  BP remains under good control on same meds...  R/O CAD (ICD-414.00) - on ASA '81mg'$ /d... Followed by DrHochrein & his notes are reviewed. ~  hx neg cardiac cath 1988 by DrJoe West Pelzer... ~  NuclearStressTest 10/02 was norm- no scar or ischemia, EF=55%. ~  CT Abd 4/10 via ER showed coronary calcif... referred to Cards. ~  Eval by DrHochrein 6/10 showed norm EKG & cardiac exam... treadmill ordered. ~  Treadmill 6/10 showed reasonable exerc tolerance, no ischemic changes, few PVCs, sl incr BP... ~  EKG 3/13 showed NSR, rate76, WNL, NAD... ~  Myoview 3/13 was neg> exercised for 17mn, stopped for fatigue & CP; no ST seg changes, occas PACs & PVCs; hypertensive BP response; no ischemia, EF=71%, normal wall motion... ~  He saw DrHochrein 4/14> hx CAD, doing satis on ASA & BP rx w/o angina & rec to follow program of aggressive risk factor reduction, no change in meds.  ~  EKG 4/14 showed NSR, rate60, wnl, NAD.. ~   6/15: .on ASA81; followed by DrHochrein- last seen 4/15- no new symptoms, active w/ yard work, EKG showed wnl, they decided to do Treadmill (borderline ST depression), then Myoview 6/15 showed NEG- no ischemia, decr uptake inferiorly same as 2013, EF=69%, norm wall motion, low risk...  MIXED HYPERLIPIDEMIA (ICD-272.2) - low HDL and NIASPAN '500mg'$ /d started 4/09... ~  [place] showed TChol 160, TG 106, HDL 26, LDL 113... rec- diet + Niaspan 500/d. ~  FLP 10/09 showed TChol 155, TG 78, HDL 33, LDL 106... ~  FLP 4/10 showed TChol 133, TG 63, HDL 30, LDL 91... rec> continue same. ~  FLP 4/11 showed TChol 144, TG 61, HDL 38, LDL 94 ~  FLP 10/11 showed TChol 155, TG 81, HDL 40, LDL 99 ~  FLP 4/12 showed TChol 142, TG 69, HDL 38, LDL 90 ~  FLP 11/12 showed TChol 165, TG 82, HDL 43, LDL 106 ~  FLP 5/13 in Niasp500 showed TChol 155, TG 72, HDL 46, LDL 95... rec change to generic. ~  FLP 1/14 on Niacin500 showed TChol 140, TG 56, HDL 39, LDL 90 ~  FLP 1/15 on Niaspan500 showed TChol 148, TG 57, HDL 43, LDL 94  ~  FLP 1/16 on Niaspan500 showed TChol 150, TG 101, HDL 45, LDL 85 ~  FLP 1/17 on Niaspan500 showed TChol 132, TG 52, HDL 46, LDL 75  DM (ICD-250.00) - on diet +  METFORMIN '500mg'$ Bid... he reports BS at home all in the 120-130's... ~  labs 4/09 showed BS= 132, HgA1c= 6.6.Marland KitchenMarland Kitchen rec- same med, better diet... ~  labs 10/09 showed BS= 131, HgA1c= 6.3.Marland KitchenMarland Kitchen ~  labs 4/10 showed BS= 117, HgA1c= 6.2.[name]. rec> same meds/ diet Rx. ~  labs 10/10 showed BS= 138, A1c= 6.5 ~  labs 4/11 showed BS= 121, A1c= 6.5.Marland KitchenMarland Kitchen On Metform500Bid + diet. ~  Dilated eye exam 5/11 by DrShapiro- no retinopathy... ~  labs 10/11 (wt=228#) showed BS= 118, A1c= 7.1.[name]. not as good- get on diet or more meds. ~  Labs 4/12 (wt=229#) showed BS= 119, A1c= 7.3... Wrong direction, may need more meds, get wt down! ~  5/12:  Ophthalmology check by DrShapiro was neg- no retinopathy... ~  Labs 11/12 showed BS= 136, A1c= 7.0 ~  Labs 5/13 on MetformBid  showed BS= 132, A1c= 7.1.Marland KitchenMarland Kitchen Continue same, get wt down. ~  Ophthalmology check from DrShapiro 5/13> no DM retinopathy... ~  Labs 1/14 showed BS= 134, A1c= 7.5; not as good- needs better diet, get wt down, same meds for now... ~  Labs 7/14 on Metform500Bid showed BS= 140, A1c= 7.3  ~  Labs 1/154 on Metform500Bid showed BS= 129 & A1c= 7.0, and we reviewed meds/ diet/ exercise/ weight... ~  Labs 7/15 on Metform500Bid showed BS= 126, A1c= 7.1 & reminded of need for diet/ exerc/ wt reduction... ~  Labs 1/16 on Metform500Bid showed BS= 133, A1c= 7.2 ~  Labs 1/17 on Metform500Bid showed BS= 134, A1c= 6.8  GERD (ICD-530.81) & ? GASTROPARESIS - on PROTONIX '40mg'$ /d, & off Reglan per DrPerry... ~   last EGD 6/07 by DrPerry showed gastic polyp... ~  8/10:  given Compazine suppos for gastroparesis flair by DrGessner. ~  4/12:  C/o intermittent dysphagia & referred back to GI for further eval (colonoscopy due as well)...  DIVERTICULOSIS OF COLON (ICD-562.10) - he uses MIRALAX Bid regularly... IRRITABLE BOWEL SYNDROME (ICD-564.1) COLONIC POLYPS (ICD-211.3) ~  last colonoscopy 5/07 by DrSam showed divertics only... f/u 6yr. ~  CT Abd 4/10 in ER showed atx bases, coronary calcif, s/p GB, abdAo calcif, 15mrenal stone, divertics, bilat hip AVN. ~  Follow up colonoscopy by DrPerry 5/12 showed severe diverticulosis, 2 sm polyps= tubular adenoma & f/u planned 5 yrs...  RENAL CALCULUS, HX OF (ICD-V13.01) BENIGN PROSTATIC HYPERTROPHY, HX OF (ICD-V13.8) - on FLOMAX 0.'4mg'$ /d w/ improved symptoms (he states flow better since stopping Vit E supplement). ~  4/12:  Routine PSA= 4.9 (it was 1.02 4/11) & we rx w/ Doxy Bid x14d, thewn plan recheck PSA==> 1.30  DEGENERATIVE JOINT DISEASE (ICD-715.90) - pt states "I have a bum knee" and eval by DrDuda w/ viscosupplementation shots... Note: Abd CT w/ bilat hip AVN noted... takes TYLENOL Prn... ~  11/10:  s/p left knee arthroscopy by DrDuda... ~  10/11:  s/p fall w/ left  knee injury- s/p cortisone shot by DrDuda; hx shot in left shoulder too. ~  7/14: he is c/o incr pain in left hip area; known hx AVN & needs f/u by Ortho...  BACK PAIN, LUMBAR (ICD-724.2)  ANXIETY (ICD-300.00) - not currently on meds for nerves.  ANEMIA (ICD-285.9) - prev hx of GIB... ~  labs 4/09 showed Hg= 14.1 ~  labs 4/10 showed Hg= 13.6 ~  labs 4/11 showed Hg= 13.4 ~  Labs 4/12 showed Hg= 13.8 ~  Labs 3/13 showed Hg= 13.1 ~  Labs 1/14 showed Hg= 13.6 ~  Labs 1/15 showed Hg= 13.7 ~  Labs 1/16 showed Hg= 14.1 ~  CBC remains WNL...  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  Health Maintenance: ~  GI: followed by DrPerry> Colonoscopy 5/07 by DrSam... f/u planned 5 yrs. ~  GU:  See PSAs recorded above... ~  Immunizations: Pneumovax in 2008 @ age 12;  Marshall given 7/15; Tetanus- given 4/10;  he gets yearly seasonal Flu vaccine.   Past Surgical History:  Procedure Laterality Date  . CATARACT EXTRACTION    . CHOLECYSTECTOMY    . KNEE SURGERY  11/10  . SKIN CANCER EXCISION  3/10   left hand     Outpatient Encounter Medications as of 07/25/2018  Medication Sig  . albuterol (PROVENTIL HFA;VENTOLIN HFA) 108 (90 Base) MCG/ACT inhaler Inhale 2 puffs into the lungs every 6 (six) hours as needed for wheezing or shortness of breath.  [name] aspirin EC 81 MG tablet Take 81 mg by mouth at bedtime.  [name] atenolol (TENORMIN) 25 MG tablet Take 1 tablet (25 mg total) by mouth daily.  . Cholecalciferol (VITAMIN D) 2000 UNITS CAPS Take 2,000 Units by mouth daily.   [name] dextromethorphan-guaiFENesin (MUCINEX DM) 30-600 MG per 12 hr tablet Take 1 tablet by mouth every 12 (twelve) hours.  [name] escitalopram (LEXAPRO) 5 MG tablet Take 5 mg by mouth daily.  . fluticasone (FLONASE) 50 MCG/ACT nasal spray Place 2 sprays into both nostrils daily as needed for allergies.  . fluticasone-salmeterol (ADVAIR HFA) 115-21 MCG/ACT inhaler Inhale 1 puff into the lungs 2 (two) times daily.  . folic acid  (FOLVITE) 1 MG tablet Take 1 mg by mouth daily with lunch.   . glimepiride (AMARYL) 2 MG tablet Take 1 tablet (2 mg total) by mouth daily with breakfast.  . glucose blood (ONE TOUCH ULTRA TEST) test strip TEST ONCE DAILY AS DIRECTED  . hyoscyamine (LEVSIN SL) 0.125 MG SL tablet Place 1 tablet (0.125 mg total) under the tongue every 4 (four) hours as needed. As needed for abdominal cramping  . lidocaine (ASPERCREME W/LIDOCAINE) 4 % cream Apply 1 application topically daily as needed (pain).   [name] lidocaine (XYLOCAINE) 2 % solution Use as directed 15 mLs in the mouth or throat as needed for mouth pain.  [name] loratadine (CLARITIN) 10 MG tablet Take 10 mg by mouth daily as needed for allergies.   [name] losartan (COZAAR) 50 MG tablet Take 1 tablet (50 mg total) by mouth daily.  . metFORMIN (GLUCOPHAGE) 500 MG tablet Take 1 tablet (500 mg total) by mouth 2 (two) times daily with a meal.  . Multiple Vitamins-Minerals (MULTIVITAMIN WITH MINERALS) tablet Take 1 tablet by mouth daily.    . nitroGLYCERIN (NITROSTAT) 0.4 MG SL tablet Place 1 tablet (0.4 mg total) under the tongue every 5 (five) minutes as needed for chest pain.  [name] ondansetron (ZOFRAN) 8 MG tablet Take 8 mg by mouth every 8 (eight) hours as needed for nausea or vomiting.  . OxyCODONE HCl, Abuse Deter, (OXAYDO) 5 MG TABA Take 5 mg by mouth every 6 (six) hours as needed (pain).  . pantoprazole (PROTONIX) 40 MG tablet Take 1 tablet (40 mg total) by mouth daily.  Vladimir Faster Glycol-Propyl Glycol (SYSTANE) 0.4-0.3 % SOLN Apply 1 drop to eye 3 (three) times daily as needed (dry eyes).  . polyethylene glycol powder (GLYCOLAX/MIRALAX) powder Take 17 g by mouth 2 (two) times daily.  . Probiotic Product (PROBIOTIC PO) Take 1 tablet by mouth daily.  . prochlorperazine (COMPRO) 25 MG suppository PLACE 1 SUPPOSITORY (  25 MG TOTAL) RECTALLY EVERY 12 (TWELVE) HOURS AS NEEDED FOR NAUSEA. (Patient taking differently: Place 25 mg rectally every 12 (twelve) hours as needed  for nausea. )  . sodium chloride (OCEAN) 0.65 % nasal spray Place 1 spray into the nose 3 (three) times daily as needed for congestion.    . tamsulosin (FLOMAX) 0.4 MG CAPS capsule Take 1 capsule (0.4 mg total) by mouth 2 (two) times daily.  . traMADol (ULTRAM) 50 MG tablet TAKE ONE TABLET BY MOUTH THREE TIMES A DAY AS NEEDED FOR PAIN  . [DISCONTINUED] acetaminophen (TYLENOL) 500 MG tablet Take 500 mg by mouth every 6 (six) hours as needed for headache (pain).   . [DISCONTINUED] atenolol (TENORMIN) 25 MG tablet Take 1 tablet (25 mg total) by mouth daily.  . [DISCONTINUED] fluticasone (FLONASE) 50 MCG/ACT nasal spray Place 2 sprays into both nostrils daily. (Patient taking differently: Place 2 sprays into both nostrils daily as needed for allergies. )  . [DISCONTINUED] fluticasone-salmeterol (ADVAIR HFA) 115-21 MCG/ACT inhaler Inhale 1 puff into the lungs 2 (two) times daily.  . [DISCONTINUED] glimepiride (AMARYL) 2 MG tablet Take 1 tablet (2 mg total) by mouth daily with breakfast.  . [DISCONTINUED] glimepiride (AMARYL) 2 MG tablet Take 1 tablet (2 mg total) by mouth daily with breakfast.  . [DISCONTINUED] glucose blood (ONE TOUCH ULTRA TEST) test strip TEST ONCE DAILY AS DIRECTED  . [DISCONTINUED] glucose blood (ONE TOUCH ULTRA TEST) test strip TEST ONCE DAILY AS DIRECTED  . [DISCONTINUED] losartan (COZAAR) 50 MG tablet Take 1 tablet (50 mg total) by mouth daily.  . [DISCONTINUED] metFORMIN (GLUCOPHAGE) 500 MG tablet Take 1 tablet (500 mg total) by mouth 2 (two) times daily with a meal.  . [DISCONTINUED] methotrexate (RHEUMATREX) 2.5 MG tablet Take 3 tablets (7.5 mg total) by mouth See admin instructions. Take 3 tablets (7.5 mg) by mouth on Tuesdays with lunch, starting 11/24/2016  . [DISCONTINUED] niacin (NIASPAN) 500 MG CR tablet Take 1 tablet (500 mg total) by mouth at bedtime.  . [DISCONTINUED] polyethylene glycol powder (GLYCOLAX/MIRALAX) powder TAKE 17 GRAMS BY MOUTH TWICE DAILY AS NEEDED  (Patient taking differently: Take 17 g by mouth 2 (two) times daily. )  . [DISCONTINUED] tamsulosin (FLOMAX) 0.4 MG CAPS capsule Take 1 capsule (0.4 mg total) by mouth 2 (two) times daily.  . niacin (NIASPAN) 500 MG CR tablet Take 1 tablet (500 mg total) by mouth at bedtime. (Patient not taking: Reported on 07/25/2018)  . [DISCONTINUED] lidocaine (XYLOCAINE) 2 % jelly 1 application    No facility-administered encounter medications on file as of 07/25/2018.     Allergies  Allergen Reactions  . Prednisone Other (See Comments)    REACTION: high dose intolerance - causes numbness from waist down - low dose tapered course ok    Immunization History  Administered Date(s) Administered  . H1N1 08/21/2008  . Influenza Split 06/01/2011, 05/26/2012  . Influenza Whole 06/06/2008, 06/21/2009, 06/06/2010  . Influenza, High Dose Seasonal PF 07/03/2016, 07/02/2017, 06/01/2018  . Influenza,inj,Quad PF,6+ Mos 06/07/2013, 07/09/2014, 06/10/2015  . Pneumococcal Conjugate-13 04/05/2014  . Pneumococcal Polysaccharide-23 06/30/2006  . Zoster 09/14/2010    Current Medications, Allergies, Past Medical History, Past Surgical History, Family History, and Social History were reviewed in [employer] record.    Review of Systems         See HPI - all other systems neg except as noted... The patient complains of decreased hearing, dyspnea on exertion, headaches, and difficulty walking.  The patient denies  anorexia, fever, weight loss, weight gain, vision loss, hoarseness, chest pain, syncope, peripheral edema, prolonged cough, hemoptysis, abdominal pain, melena, hematochezia, severe indigestion/heartburn, hematuria, incontinence, muscle weakness, suspicious skin lesions, transient blindness, depression, unusual weight change, abnormal bleeding, enlarged lymph nodes, and angioedema.     Objective:   Physical Exam      WD, WN, 83 y/o WM in NAD... GENERAL:  Alert & oriented; pleasant &  cooperative, visibly weaker... HEENT:  [place]/AT, EOM- no motion, complete ophthalmoplegia, EACs-clear, TMs-wnl, NOSE- pale, congested, THROAT- tongue swollen, incr secretions, dysarhtric... NECK:  Supple w/ fairROM; no JVD; normal carotid impulses w/o bruits; no thyromegaly or nodules palpated; no lymphadenopathy. CHEST:  Clear to P & A w/o wheezing, rhonchi, rales, or signs of consolidation... HEART:  irreg w/ controlled VR; without murmurs/ rubs/ or gallops detected...  ABDOMEN:  Soft & nontender; normal bowel sounds; no organomegaly or masses palpated., +foley w/ leg bag... EXT: without deformities, mild arthritic changes; no varicose veins/ venous insuffic/ or edema. NEURO:  Complete ophthalmoplegia DERM:  No lesions noted; no rash etc...  RADIOLOGY DATA:  Reviewed in the EPIC EMR & discussed w/ the patient...  LABORATORY DATA:  Reviewed in the EPIC EMR & discussed w/ the patient...   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 07/2018=> now w/ mult complications including ophthalmoplegia, liver inflamm, urinary retention, AFib, and appears to be going downhill => he has f/u appt at [facility] tomorrow & I suspect he will need ADM.  Recurrent Sinusitis> Hx Acute Bronchitis> presented 10/15 w/ refractory bronchitic episode; given ZPak=>Levaquin, Depo80/ Medrol dosepak, Mucinex, Hydromet, etc=> resolved... COPD exac>  He continues on Advair, Proair rescue, Mucinex; also Claritin, Flonase, Saline; stable continue same Rx... LLL Pneumonia 11/2016>  [facility]osp by [facility] & treated w/ Rocephin/Zithromax, Medrol taper, NEBS, etc & improved => resolved... 05/27/17>   Jesse Santos has had a long hx recurrent sinusitis & allergies followed by DrESL at [facility]; now off shots & having more difficulty; he has seen DrVanWinkle in  follow up;  We discussed Rx w/ Levaquin '750mg'$ /d x10d + Align & Activia;  Given Depo120 & continue Pred taper from DrDuda; continue Mucinex, Flonase, Claritin, [place] and f/u w/ ENT DrShoemaker for cerumen/ hearing aides/ sinusitis... 10/18/17>   FLP is ok on Niaspan500, continue same;  DM control not as good w/ BS=140 & A1c=7.2 on Metform500bid, rec adding glimep'2mg'$ Qam; he will maintain f/u w/ [facility]; otherw continue same meds and try to avoid infections, may need to get back on allergy shots. 12/09/17>   Jesse Santos has yet another sinus infection & we will treat w/ Levaquin '500mg'$ x10d and Depo80 + Pred '20mg'$  tabs- tapering sched (see AVS);  OK to continue the [place], [place], & Mucinex as before;  We will arrange for the ENT appt.. 04/21/18>   Jesse Santos is clinically stable but has a rough time w/ the Lichen Planus- eval & rx from DrJorizzo, [facility];  They are considering the Mycophenolate & I reported to him the cash-pay price thru [facility];  He feels sl depressed but declines antidepressant & doesn't want more meds;  He requests a handicap parking permit (form completed for pt);  He will continue current meds and planned f/u visits;  He needs a baseline BMD on the chr Pred rx- pending  HBP>  On  LOSARTAN-'50mg'$ /d & ATEN-25 w/ good control of BP, continue same...  R/O CAD>  Followed by DrHochrein, and doing satis w/ neg Myoview 6/15... continue ASA & risk factor reduction....  Lipids>  Looks satis on his diet + Niacin...  DM>  Control is fair on Metform500Bid w/ A1c stable up to 7.2;  We decided to add Glimep'2mg'$  Qam w/ improved A1c to 6.8  GI>  Per DrPerry w/ GERD, ?Gastroparesis, Divertics, IBS, Colon polyps> f/u colon 5/12 w/ divertics & 2 sm adenomas, repeat planned ~66yr...  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 04/21/18>   We will sched a baseline BMD on the chr Pred therapy for the lichen planus...   Anxiety>  Certainly an issue, he does not want  anxiolytic Rx...   Patient's Medications  New Prescriptions   No medications on file  Previous Medications   ALBUTEROL (PROVENTIL HFA;VENTOLIN HFA) 108 (90 BASE) MCG/ACT INHALER    Inhale 2 puffs into the lungs every 6 (six) hours as needed for wheezing or shortness of breath.   ASPIRIN EC 81 MG TABLET    Take 81 mg by mouth at bedtime.   CHOLECALCIFEROL (VITAMIN D) 2000 UNITS CAPS    Take 2,000 Units by mouth daily.    DEXTROMETHORPHAN-GUAIFENESIN (MUCINEX DM) 30-600 MG PER 12 HR TABLET    Take 1 tablet by mouth every 12 (twelve) hours.   ESCITALOPRAM (LEXAPRO) 5 MG TABLET    Take 5 mg by mouth daily.   FOLIC ACID (FOLVITE) 1 MG TABLET    Take 1 mg by mouth daily with lunch.    HYOSCYAMINE (LEVSIN SL) 0.125 MG SL TABLET    Place 1 tablet (0.125 mg total) under the tongue every 4 (four) hours as needed. As needed for abdominal cramping   LIDOCAINE (ASPERCREME W/LIDOCAINE) 4 % CREAM    Apply 1 application topically daily as needed (pain).    LIDOCAINE (XYLOCAINE) 2 % SOLUTION    Use as directed 15 mLs in the mouth or throat as needed for mouth pain.   LORATADINE (CLARITIN) 10 MG TABLET    Take 10 mg by mouth daily as needed for allergies.    MULTIPLE VITAMINS-MINERALS (MULTIVITAMIN WITH MINERALS) TABLET    Take 1 tablet by mouth daily.     NITROGLYCERIN (NITROSTAT) 0.4 MG SL TABLET    Place 1 tablet (0.4 mg total) under the tongue every 5 (five) minutes as needed for chest pain.   ONDANSETRON (ZOFRAN) 8 MG TABLET    Take 8 mg by mouth every 8 (eight) hours as needed for nausea or vomiting.   OXYCODONE HCL, ABUSE DETER, (OXAYDO) 5 MG TABA    Take 5 mg by mouth every 6 (six) hours as needed (pain).   PANTOPRAZOLE (PROTONIX) 40 MG TABLET    Take 1 tablet (40 mg total) by mouth daily.   POLYETHYL GLYCOL-PROPYL GLYCOL (SYSTANE) 0.4-0.3 % SOLN    Apply 1 drop to eye 3 (three) times daily as needed (dry eyes).   PROBIOTIC PRODUCT (PROBIOTIC PO)    Take 1 tablet by mouth daily.   PROCHLORPERAZINE  (COMPRO) 25 MG SUPPOSITORY    PLACE 1 SUPPOSITORY (25 MG TOTAL) RECTALLY EVERY 12 (TWELVE) HOURS AS NEEDED FOR NAUSEA.   SODIUM CHLORIDE (OCEAN) 0.65 % NASAL SPRAY    Place 1 spray into the nose 3 (three) times daily as needed for congestion.     TRAMADOL (ULTRAM) 50 MG TABLET  TAKE ONE TABLET BY MOUTH THREE TIMES A DAY AS NEEDED FOR PAIN  Modified Medications   Modified Medication Previous Medication   ATENOLOL (TENORMIN) 25 MG TABLET atenolol (TENORMIN) 25 MG tablet      Take 1 tablet (25 mg total) by mouth daily.    Take 1 tablet (25 mg total) by mouth daily.   FLUTICASONE (FLONASE) 50 MCG/ACT NASAL SPRAY fluticasone (FLONASE) 50 MCG/ACT nasal spray      Place 2 sprays into both nostrils daily as needed for allergies.    Place 2 sprays into both nostrils daily.   FLUTICASONE-SALMETEROL (ADVAIR HFA) 115-21 MCG/ACT INHALER fluticasone-salmeterol (ADVAIR HFA) 115-21 MCG/ACT inhaler      Inhale 1 puff into the lungs 2 (two) times daily.    Inhale 1 puff into the lungs 2 (two) times daily.   GLIMEPIRIDE (AMARYL) 2 MG TABLET glimepiride (AMARYL) 2 MG tablet      Take 1 tablet (2 mg total) by mouth daily with breakfast.    Take 1 tablet (2 mg total) by mouth daily with breakfast.   GLUCOSE BLOOD (ONE TOUCH ULTRA TEST) TEST STRIP glucose blood (ONE TOUCH ULTRA TEST) test strip      TEST ONCE DAILY AS DIRECTED    TEST ONCE DAILY AS DIRECTED   LOSARTAN (COZAAR) 50 MG TABLET losartan (COZAAR) 50 MG tablet      Take 1 tablet (50 mg total) by mouth daily.    Take 1 tablet (50 mg total) by mouth daily.   METFORMIN (GLUCOPHAGE) 500 MG TABLET metFORMIN (GLUCOPHAGE) 500 MG tablet      Take 1 tablet (500 mg total) by mouth 2 (two) times daily with a meal.    Take 1 tablet (500 mg total) by mouth 2 (two) times daily with a meal.   NIACIN (NIASPAN) 500 MG CR TABLET niacin (NIASPAN) 500 MG CR tablet      Take 1 tablet (500 mg total) by mouth at bedtime.    Take 1 tablet (500 mg total) by mouth at bedtime.    POLYETHYLENE GLYCOL POWDER (GLYCOLAX/MIRALAX) POWDER polyethylene glycol powder (GLYCOLAX/MIRALAX) powder      Take 17 g by mouth 2 (two) times daily.    TAKE 17 GRAMS BY MOUTH TWICE DAILY AS NEEDED   TAMSULOSIN (FLOMAX) 0.4 MG CAPS CAPSULE tamsulosin (FLOMAX) 0.4 MG CAPS capsule      Take 1 capsule (0.4 mg total) by mouth 2 (two) times daily.    Take 1 capsule (0.4 mg total) by mouth 2 (two) times daily.  Discontinued Medications   ACETAMINOPHEN (TYLENOL) 500 MG TABLET    Take 500 mg by mouth every 6 (six) hours as needed for headache (pain).    METHOTREXATE (RHEUMATREX) 2.5 MG TABLET    Take 3 tablets (7.5 mg total) by mouth See admin instructions. Take 3 tablets (7.5 mg) by mouth on Tuesdays with lunch, starting 11/24/2016

## 2018-07-25 NOTE — ED Notes (Signed)
Bladder scan performed by Sam, NT. Results of bladder scan were 45 ml and 56 ml.

## 2018-07-25 NOTE — ED Provider Notes (Signed)
[facility]se [facility] DEPT Provider Note   CSN: [id number] Arrival date & time: 07/25/18  0222     History   Chief Complaint Chief Complaint  Patient presents with  . Urinary Retention  . Leg Swelling    HPI Jesse Santos is a 82 y.o. male.  HPI Patient presents to the emergency department with decreased urine output over the last 14 hours.  The patient is also having some discomfort in the abdomen and back.  Patient states his back pain is chronic and has been ongoing for quite a while but is worse over the last 24 hours.  The patient states that nothing seems to make his condition better or worse.  He states he did take up 5 mg oxycodone without significant relief of his symptoms.  Patient states he has generalized body aches.  The patient states that he is currently receiving chemotherapy every Monday.  Patient states that he was just started on Lexapro. Past Medical History:  Diagnosis Date  . Allergic rhinitis, cause unspecified   . Anemia   . Anxiety   . Benign neoplasm of colon   . BPH (benign prostatic hypertrophy)   . Cancer ([place])    Tongue cancer  . COPD (chronic obstructive pulmonary disease) ([place])   . Coronary artery disease    Coronary calcification with negative ETT 2010;   ETT-Myoview (02/2014):  Normal stress nuclear study. No evidence of ischemia.  LV Ejection Fraction: 69%  . Degenerative joint disease   . Diverticulosis of colon (without mention of hemorrhage)   . Esophageal reflux   . Hearing loss   . Hyperplastic colon polyp 02/12/2011  . Irritable bowel syndrome   . Lumbar back pain   . Mixed hyperlipidemia   . Obstructive sleep apnea (adult) (pediatric)   . Oral lichen planus   . Renal calculus   . Type II or unspecified type diabetes mellitus without mention of complication, not stated as uncontrolled   . Unspecified essential hypertension     Patient Active Problem List   Diagnosis Date Noted  . Aortic atherosclerosis  ([place]) 11/19/2017  . High coronary artery calcium score 11/19/2017  . Tobacco use 11/19/2017  . Recurrent sinusitis 05/27/2017  . History of pneumonia 05/27/2017  . Unilateral primary osteoarthritis, right hip 05/13/2017  . Other intervertebral disc degeneration, lumbar region 05/13/2017  . Pain in right hip 05/13/2017  . Hemoptysis 11/13/2016  . Lichen planus 89/38/1017  . Type 2 diabetes mellitus without complication, without long-term current use of insulin ([place]) 10/15/2015  . COPD (chronic obstructive pulmonary disease) with chronic bronchitis ([place]) 04/10/2015  . History of colonic polyps 12/28/2014  . Obstruction to urinary outflow 01/02/2011  . Overweight(278.02) 11/11/2009  . GASTROPARESIS 04/15/2009  . CONSTIPATION 04/15/2009  . CORONARY ATHEROSCLEROSIS NATIVE CORONARY ARTERY 02/15/2009  . GASTROENTERITIS 01/01/2009  . Benign prostatic hyperplasia with urinary obstruction 07/29/2008  . MIXED HYPERLIPIDEMIA 07/10/2008  . Osteoarthritis 07/10/2008  . HEARING LOSS 01/11/2008  . Diverticulosis of large intestine 01/11/2008  . COLONIC POLYPS 07/22/2007  . ANEMIA 07/22/2007  . Anxiety state 07/22/2007  . Obstructive sleep apnea 07/22/2007  . Essential hypertension 07/22/2007  . Allergic rhinitis 07/22/2007  . GERD 07/22/2007  . IRRITABLE BOWEL SYNDROME 07/22/2007  . BACK PAIN, LUMBAR 07/22/2007  . RENAL CALCULUS, HX OF 07/22/2007    Past Surgical History:  Procedure Laterality Date  . CATARACT EXTRACTION    . CHOLECYSTECTOMY    . KNEE SURGERY  11/10  . SKIN CANCER  EXCISION  3/10   left hand         Home Medications    Prior to Admission medications   Medication Sig Start Date End Date Taking? Authorizing Provider  acetaminophen (TYLENOL) 500 MG tablet Take 500 mg by mouth every 6 (six) hours as needed for headache (pain).    Yes [provider]  albuterol (PROVENTIL HFA;VENTOLIN HFA) 108 (90 Base) MCG/ACT inhaler Inhale 2 puffs into the lungs every 6  (six) hours as needed for wheezing or shortness of breath.   Yes [provider]  aspirin EC 81 MG tablet Take 81 mg by mouth at bedtime.   Yes [provider]  atenolol (TENORMIN) 25 MG tablet Take 1 tablet (25 mg total) by mouth daily. 04/21/18  Yes Noralee Space, MD  Cholecalciferol (VITAMIN D) 2000 UNITS CAPS Take 2,000 Units by mouth daily.    Yes [provider]  dextromethorphan-guaiFENesin (MUCINEX DM) 30-600 MG per 12 hr tablet Take 1 tablet by mouth every 12 (twelve) hours.   Yes [provider]  escitalopram (LEXAPRO) 5 MG tablet Take 5 mg by mouth daily.   Yes [provider]  fluticasone (FLONASE) 50 MCG/ACT nasal spray Place 2 sprays into both nostrils daily. Patient taking differently: Place 2 sprays into both nostrils daily as needed for allergies.  04/14/17  Yes Noralee Space, MD  fluticasone-salmeterol (ADVAIR HFA) [phone] MCG/ACT inhaler Inhale 1 puff into the lungs 2 (two) times daily. 04/21/18  Yes Noralee Space, MD  folic acid (FOLVITE) 1 MG tablet Take 1 mg by mouth daily with lunch.  07/21/16  Yes [provider]  glimepiride (AMARYL) 2 MG tablet Take 1 tablet (2 mg total) by mouth daily with breakfast. 10/19/17  Yes Noralee Space, MD  lidocaine (ASPERCREME W/LIDOCAINE) 4 % cream Apply 1 application topically daily as needed (pain).    Yes [provider]  lidocaine (XYLOCAINE) 2 % solution Use as directed 15 mLs in the mouth or throat as needed for mouth pain.   Yes [provider]  loratadine (CLARITIN) 10 MG tablet Take 10 mg by mouth daily as needed for allergies.    Yes [provider]  losartan (COZAAR) 50 MG tablet Take 1 tablet (50 mg total) by mouth daily. 04/21/18  Yes Noralee Space, MD  metFORMIN (GLUCOPHAGE) 500 MG tablet Take 1 tablet (500 mg total) by mouth 2 (two) times daily with a meal. 04/21/18  Yes Noralee Space, MD  Multiple Vitamins-Minerals (MULTIVITAMIN WITH MINERALS) tablet Take 1  tablet by mouth daily.     Yes [provider]  niacin (NIASPAN) 500 MG CR tablet Take 1 tablet (500 mg total) by mouth at bedtime. 04/21/18  Yes Noralee Space, MD  ondansetron (ZOFRAN) 8 MG tablet Take 8 mg by mouth every 8 (eight) hours as needed for nausea or vomiting.   Yes [provider]  OxyCODONE HCl, Abuse Deter, (OXAYDO) 5 MG TABA Take 5 mg by mouth every 6 (six) hours as needed (pain).   Yes [provider]  pantoprazole (PROTONIX) 40 MG tablet Take 1 tablet (40 mg total) by mouth daily. 04/21/18  Yes Noralee Space, MD  Polyethyl Glycol-Propyl Glycol (SYSTANE) 0.4-0.3 % SOLN Apply 1 drop to eye 3 (three) times daily as needed (dry eyes).   Yes [provider]  polyethylene glycol powder (GLYCOLAX/MIRALAX) powder TAKE 17 GRAMS BY MOUTH TWICE DAILY AS NEEDED Patient taking differently: Take 17 g by  mouth 2 (two) times daily.  04/21/18  Yes Noralee Space, MD  Probiotic Product (PROBIOTIC PO) Take 1 tablet by mouth daily.   Yes [provider]  sodium chloride (OCEAN) 0.65 % nasal spray Place 1 spray into the nose 3 (three) times daily as needed for congestion.     Yes [provider]  tamsulosin (FLOMAX) 0.4 MG CAPS capsule Take 1 capsule (0.4 mg total) by mouth 2 (two) times daily. 04/21/18  Yes Noralee Space, MD  glucose blood (ONE TOUCH ULTRA TEST) test strip TEST ONCE DAILY AS DIRECTED 04/11/18   Noralee Space, MD  hyoscyamine (LEVSIN SL) 0.125 MG SL tablet Place 1 tablet (0.125 mg total) under the tongue every 4 (four) hours as needed. As needed for abdominal cramping Patient not taking: Reported on 07/25/2018 10/18/17   Noralee Space, MD  methotrexate (RHEUMATREX) 2.5 MG tablet Take 3 tablets (7.5 mg total) by mouth See admin instructions. Take 3 tablets (7.5 mg) by mouth on Tuesdays with lunch, starting 11/24/2016 Patient not taking: Reported on 07/25/2018 11/15/16   Lavina Hamman, MD  nitroGLYCERIN (NITROSTAT) 0.4 MG SL tablet Place 1  tablet (0.4 mg total) under the tongue every 5 (five) minutes as needed for chest pain. 01/08/14   Minus Breeding, MD  prochlorperazine (COMPRO) 25 MG suppository PLACE 1 SUPPOSITORY (25 MG TOTAL) RECTALLY EVERY 12 (TWELVE) HOURS AS NEEDED FOR NAUSEA. Patient taking differently: Place 25 mg rectally every 12 (twelve) hours as needed for nausea.  04/21/18   Noralee Space, MD  traMADol (ULTRAM) 50 MG tablet TAKE ONE TABLET BY MOUTH THREE TIMES A DAY AS NEEDED FOR PAIN Patient not taking: Reported on 07/25/2018 04/21/18   Noralee Space, MD    Family History Family History  Problem Relation Age of Onset  . Heart disease Father 104  . Hypertension Brother   . Stroke Mother 78  . Diabetes Mother   . Colon cancer Neg Hx     Social History Social History   Tobacco Use  . Smoking status: Former Smoker    Packs/day: 1.00    Years: 25.00    Pack years: 25.00    Types: Cigarettes    Last attempt to quit: 09/15/1979    Years since quitting: 38.8  . Smokeless tobacco: Former Systems developer    Quit date: 09/15/1979  Substance Use Topics  . Alcohol use: No    Alcohol/week: 0.0 standard drinks  . Drug use: No     Allergies   Prednisone   Review of Systems Review of Systems All other systems negative except as documented in the HPI. All pertinent positives and negatives as reviewed in the HPI.   Physical Exam Updated Vital Signs BP (!) 144/74 (BP Location: Right Arm)   Pulse 83   Temp 97.8 F (36.6 C) (Oral)   Resp 16   SpO2 100%   Physical Exam  Constitutional: He is oriented to person, place, and time. He appears well-developed and well-nourished. No distress.  HENT:  Head: Normocephalic and atraumatic.  Mouth/Throat: Oropharynx is clear and moist.  Eyes: Pupils are equal, round, and reactive to light.  Neck: Normal range of motion. Neck supple.  Cardiovascular: Normal rate, regular rhythm and normal heart sounds. Exam reveals no gallop and no friction rub.  No murmur  heard. Pulmonary/Chest: Effort normal and breath sounds normal. No respiratory distress. He has no wheezes.  Abdominal: Soft. Bowel sounds are normal. He exhibits no distension and no mass.  There is tenderness in the suprapubic area. There is no rebound and no guarding.  Neurological: He is alert and oriented to person, place, and time. He exhibits normal muscle tone. Coordination normal.  Skin: Skin is warm and dry. Capillary refill takes less than 2 seconds. No rash noted. No erythema.  Psychiatric: He has a normal mood and affect. His behavior is normal.  Nursing note and vitals reviewed.    ED Treatments / Results  Labs (all labs ordered are listed, but only abnormal results are displayed) Labs Reviewed  URINALYSIS, ROUTINE W REFLEX MICROSCOPIC - Abnormal; Notable for the following components:      Result Value   APPearance HAZY (*)    Hgb urine dipstick LARGE (*)    Ketones, ur 20 (*)    Protein, ur 100 (*)    Bacteria, UA RARE (*)    All other components within normal limits  CBC WITH DIFFERENTIAL/PLATELET - Abnormal; Notable for the following components:   WBC 3.7 (*)    RBC 3.50 (*)    Hemoglobin 10.7 (*)    HCT 33.3 (*)    Lymphs Abs 0.5 (*)    All other components within normal limits  COMPREHENSIVE METABOLIC PANEL - Abnormal; Notable for the following components:   Sodium 132 (*)    Glucose, Bld 118 (*)    Creatinine, Ser 0.58 (*)    Total Protein 6.0 (*)    Albumin 3.2 (*)    AST 635 (*)    ALT 281 (*)    Total Bilirubin 0.2 (*)    All other components within normal limits    EKG None  Radiology No results found.  Procedures Procedures (including critical care time)  Medications Ordered in ED Medications  lidocaine (XYLOCAINE) 2 % jelly 1 application (has no administration in time range)  sodium chloride 0.9 % bolus 500 mL (500 mLs Intravenous New Bag/Given 07/25/18 0430)  morphine 4 MG/ML injection 4 mg (4 mg Intravenous Given 07/25/18 0428)      Initial Impression / Assessment and Plan / ED Course  I have reviewed the triage vital signs and the nursing notes.  Pertinent labs & imaging results that were available during my care of the patient were reviewed by me and considered in my medical decision making (see chart for details).    The patient will be referred to Urology and told to follow up with his doctor. Told to increase his fluid intake. Patient has been stable here in the  ER.  Final Clinical Impressions(s) / ED Diagnoses   Final diagnoses:  Urinary retention  Benign prostatic hyperplasia with incomplete bladder emptying  Transaminitis    ED Discharge Orders    None       Dalia Heading, PA-C 07/25/18 [id number]    Gareth Morgan, MD 07/25/18 1944

## 2018-07-26 DIAGNOSIS — R5383 Other fatigue: Secondary | ICD-10-CM | POA: Diagnosis not present

## 2018-07-26 DIAGNOSIS — R419 Unspecified symptoms and signs involving cognitive functions and awareness: Secondary | ICD-10-CM | POA: Diagnosis not present

## 2018-07-26 DIAGNOSIS — I10 Essential (primary) hypertension: Secondary | ICD-10-CM | POA: Diagnosis present

## 2018-07-26 DIAGNOSIS — A419 Sepsis, unspecified organism: Secondary | ICD-10-CM | POA: Diagnosis not present

## 2018-07-26 DIAGNOSIS — R001 Bradycardia, unspecified: Secondary | ICD-10-CM | POA: Diagnosis not present

## 2018-07-26 DIAGNOSIS — Z8719 Personal history of other diseases of the digestive system: Secondary | ICD-10-CM | POA: Diagnosis not present

## 2018-07-26 DIAGNOSIS — I4430 Unspecified atrioventricular block: Secondary | ICD-10-CM | POA: Diagnosis not present

## 2018-07-26 DIAGNOSIS — K754 Autoimmune hepatitis: Secondary | ICD-10-CM | POA: Diagnosis not present

## 2018-07-26 DIAGNOSIS — B370 Candidal stomatitis: Secondary | ICD-10-CM | POA: Diagnosis present

## 2018-07-26 DIAGNOSIS — H5123 Internuclear ophthalmoplegia, bilateral: Secondary | ICD-10-CM | POA: Diagnosis not present

## 2018-07-26 DIAGNOSIS — J9601 Acute respiratory failure with hypoxia: Secondary | ICD-10-CM | POA: Diagnosis not present

## 2018-07-26 DIAGNOSIS — J440 Chronic obstructive pulmonary disease with acute lower respiratory infection: Secondary | ICD-10-CM | POA: Diagnosis not present

## 2018-07-26 DIAGNOSIS — G8250 Quadriplegia, unspecified: Secondary | ICD-10-CM | POA: Diagnosis not present

## 2018-07-26 DIAGNOSIS — I409 Acute myocarditis, unspecified: Secondary | ICD-10-CM | POA: Diagnosis not present

## 2018-07-26 DIAGNOSIS — I459 Conduction disorder, unspecified: Secondary | ICD-10-CM | POA: Diagnosis not present

## 2018-07-26 DIAGNOSIS — R1310 Dysphagia, unspecified: Secondary | ICD-10-CM | POA: Diagnosis not present

## 2018-07-26 DIAGNOSIS — E46 Unspecified protein-calorie malnutrition: Secondary | ICD-10-CM | POA: Diagnosis present

## 2018-07-26 DIAGNOSIS — Z7982 Long term (current) use of aspirin: Secondary | ICD-10-CM | POA: Diagnosis not present

## 2018-07-26 DIAGNOSIS — K7200 Acute and subacute hepatic failure without coma: Secondary | ICD-10-CM | POA: Diagnosis not present

## 2018-07-26 DIAGNOSIS — R918 Other nonspecific abnormal finding of lung field: Secondary | ICD-10-CM | POA: Diagnosis not present

## 2018-07-26 DIAGNOSIS — J690 Pneumonitis due to inhalation of food and vomit: Secondary | ICD-10-CM | POA: Diagnosis not present

## 2018-07-26 DIAGNOSIS — Z87891 Personal history of nicotine dependence: Secondary | ICD-10-CM | POA: Diagnosis not present

## 2018-07-26 DIAGNOSIS — Z5111 Encounter for antineoplastic chemotherapy: Secondary | ICD-10-CM | POA: Diagnosis not present

## 2018-07-26 DIAGNOSIS — R339 Retention of urine, unspecified: Secondary | ICD-10-CM | POA: Diagnosis not present

## 2018-07-26 DIAGNOSIS — R9431 Abnormal electrocardiogram [ECG] [EKG]: Secondary | ICD-10-CM | POA: Diagnosis not present

## 2018-07-26 DIAGNOSIS — E873 Alkalosis: Secondary | ICD-10-CM | POA: Diagnosis not present

## 2018-07-26 DIAGNOSIS — R0989 Other specified symptoms and signs involving the circulatory and respiratory systems: Secondary | ICD-10-CM | POA: Diagnosis not present

## 2018-07-26 DIAGNOSIS — I2510 Atherosclerotic heart disease of native coronary artery without angina pectoris: Secondary | ICD-10-CM | POA: Diagnosis not present

## 2018-07-26 DIAGNOSIS — I4510 Unspecified right bundle-branch block: Secondary | ICD-10-CM | POA: Diagnosis not present

## 2018-07-26 DIAGNOSIS — Z950 Presence of cardiac pacemaker: Secondary | ICD-10-CM | POA: Diagnosis not present

## 2018-07-26 DIAGNOSIS — Z4682 Encounter for fitting and adjustment of non-vascular catheter: Secondary | ICD-10-CM | POA: Diagnosis not present

## 2018-07-26 DIAGNOSIS — J449 Chronic obstructive pulmonary disease, unspecified: Secondary | ICD-10-CM | POA: Diagnosis not present

## 2018-07-26 DIAGNOSIS — I808 Phlebitis and thrombophlebitis of other sites: Secondary | ICD-10-CM | POA: Diagnosis not present

## 2018-07-26 DIAGNOSIS — M3320 Polymyositis, organ involvement unspecified: Secondary | ICD-10-CM | POA: Diagnosis not present

## 2018-07-26 DIAGNOSIS — C023 Malignant neoplasm of anterior two-thirds of tongue, part unspecified: Secondary | ICD-10-CM | POA: Diagnosis present

## 2018-07-26 DIAGNOSIS — Z9911 Dependence on respirator [ventilator] status: Secondary | ICD-10-CM | POA: Diagnosis not present

## 2018-07-26 DIAGNOSIS — H02403 Unspecified ptosis of bilateral eyelids: Secondary | ICD-10-CM | POA: Diagnosis not present

## 2018-07-26 DIAGNOSIS — T50995A Adverse effect of other drugs, medicaments and biological substances, initial encounter: Secondary | ICD-10-CM | POA: Diagnosis not present

## 2018-07-26 DIAGNOSIS — J9602 Acute respiratory failure with hypercapnia: Secondary | ICD-10-CM | POA: Diagnosis not present

## 2018-07-26 DIAGNOSIS — C029 Malignant neoplasm of tongue, unspecified: Secondary | ICD-10-CM | POA: Diagnosis not present

## 2018-07-26 DIAGNOSIS — E7220 Disorder of urea cycle metabolism, unspecified: Secondary | ICD-10-CM | POA: Diagnosis not present

## 2018-07-26 DIAGNOSIS — B199 Unspecified viral hepatitis without hepatic coma: Secondary | ICD-10-CM | POA: Diagnosis not present

## 2018-07-26 DIAGNOSIS — R570 Cardiogenic shock: Secondary | ICD-10-CM | POA: Diagnosis not present

## 2018-07-26 DIAGNOSIS — I469 Cardiac arrest, cause unspecified: Secondary | ICD-10-CM | POA: Diagnosis not present

## 2018-07-26 DIAGNOSIS — J810 Acute pulmonary edema: Secondary | ICD-10-CM | POA: Diagnosis not present

## 2018-07-26 DIAGNOSIS — R509 Fever, unspecified: Secondary | ICD-10-CM | POA: Diagnosis not present

## 2018-07-26 DIAGNOSIS — G729 Myopathy, unspecified: Secondary | ICD-10-CM | POA: Diagnosis not present

## 2018-07-26 DIAGNOSIS — M1990 Unspecified osteoarthritis, unspecified site: Secondary | ICD-10-CM | POA: Diagnosis not present

## 2018-07-26 DIAGNOSIS — R531 Weakness: Secondary | ICD-10-CM | POA: Diagnosis not present

## 2018-07-26 DIAGNOSIS — C01 Malignant neoplasm of base of tongue: Secondary | ICD-10-CM | POA: Diagnosis not present

## 2018-07-26 DIAGNOSIS — I4892 Unspecified atrial flutter: Secondary | ICD-10-CM | POA: Diagnosis not present

## 2018-07-26 DIAGNOSIS — Z7902 Long term (current) use of antithrombotics/antiplatelets: Secondary | ICD-10-CM | POA: Diagnosis not present

## 2018-07-26 DIAGNOSIS — Z6827 Body mass index (BMI) 27.0-27.9, adult: Secondary | ICD-10-CM | POA: Diagnosis not present

## 2018-07-26 DIAGNOSIS — J90 Pleural effusion, not elsewhere classified: Secondary | ICD-10-CM | POA: Diagnosis not present

## 2018-07-26 DIAGNOSIS — G1229 Other motor neuron disease: Secondary | ICD-10-CM | POA: Diagnosis not present

## 2018-07-26 DIAGNOSIS — E119 Type 2 diabetes mellitus without complications: Secondary | ICD-10-CM | POA: Diagnosis not present

## 2018-07-26 DIAGNOSIS — H532 Diplopia: Secondary | ICD-10-CM | POA: Diagnosis not present

## 2018-07-26 DIAGNOSIS — E785 Hyperlipidemia, unspecified: Secondary | ICD-10-CM | POA: Diagnosis present

## 2018-07-26 DIAGNOSIS — G893 Neoplasm related pain (acute) (chronic): Secondary | ICD-10-CM | POA: Diagnosis present

## 2018-07-26 DIAGNOSIS — I959 Hypotension, unspecified: Secondary | ICD-10-CM | POA: Diagnosis not present

## 2018-07-26 DIAGNOSIS — J441 Chronic obstructive pulmonary disease with (acute) exacerbation: Secondary | ICD-10-CM | POA: Diagnosis present

## 2018-07-26 DIAGNOSIS — R740 Nonspecific elevation of levels of transaminase and lactic acid dehydrogenase [LDH]: Secondary | ICD-10-CM | POA: Diagnosis not present

## 2018-07-26 DIAGNOSIS — B179 Acute viral hepatitis, unspecified: Secondary | ICD-10-CM | POA: Diagnosis not present

## 2018-07-26 DIAGNOSIS — B961 Klebsiella pneumoniae [K. pneumoniae] as the cause of diseases classified elsewhere: Secondary | ICD-10-CM | POA: Diagnosis not present

## 2018-07-26 DIAGNOSIS — I442 Atrioventricular block, complete: Secondary | ICD-10-CM | POA: Diagnosis not present

## 2018-07-26 DIAGNOSIS — R945 Abnormal results of liver function studies: Secondary | ICD-10-CM | POA: Diagnosis not present

## 2018-07-26 DIAGNOSIS — I4891 Unspecified atrial fibrillation: Secondary | ICD-10-CM | POA: Diagnosis not present

## 2018-07-26 DIAGNOSIS — J9600 Acute respiratory failure, unspecified whether with hypoxia or hypercapnia: Secondary | ICD-10-CM | POA: Diagnosis not present

## 2018-07-26 DIAGNOSIS — Z85810 Personal history of malignant neoplasm of tongue: Secondary | ICD-10-CM | POA: Diagnosis not present

## 2018-07-26 DIAGNOSIS — Z66 Do not resuscitate: Secondary | ICD-10-CM | POA: Diagnosis not present

## 2018-07-26 DIAGNOSIS — E1165 Type 2 diabetes mellitus with hyperglycemia: Secondary | ICD-10-CM | POA: Diagnosis present

## 2018-07-26 DIAGNOSIS — D899 Disorder involving the immune mechanism, unspecified: Secondary | ICD-10-CM | POA: Diagnosis not present

## 2018-07-26 DIAGNOSIS — I2584 Coronary atherosclerosis due to calcified coronary lesion: Secondary | ICD-10-CM | POA: Diagnosis not present

## 2018-07-26 DIAGNOSIS — R000 Tachycardia, unspecified: Secondary | ICD-10-CM | POA: Diagnosis not present

## 2018-07-26 DIAGNOSIS — M6281 Muscle weakness (generalized): Secondary | ICD-10-CM | POA: Diagnosis not present

## 2018-07-26 DIAGNOSIS — I6502 Occlusion and stenosis of left vertebral artery: Secondary | ICD-10-CM | POA: Diagnosis present

## 2018-07-26 DIAGNOSIS — G1222 Progressive bulbar palsy: Secondary | ICD-10-CM | POA: Diagnosis not present

## 2018-07-26 DIAGNOSIS — Z7901 Long term (current) use of anticoagulants: Secondary | ICD-10-CM | POA: Diagnosis not present

## 2018-07-26 DIAGNOSIS — N138 Other obstructive and reflux uropathy: Secondary | ICD-10-CM | POA: Diagnosis present

## 2018-07-26 DIAGNOSIS — H499 Unspecified paralytic strabismus: Secondary | ICD-10-CM | POA: Diagnosis not present

## 2018-07-26 DIAGNOSIS — I472 Ventricular tachycardia: Secondary | ICD-10-CM | POA: Diagnosis not present

## 2018-07-26 DIAGNOSIS — D693 Immune thrombocytopenic purpura: Secondary | ICD-10-CM | POA: Diagnosis not present

## 2018-07-26 DIAGNOSIS — G7001 Myasthenia gravis with (acute) exacerbation: Secondary | ICD-10-CM | POA: Diagnosis present

## 2018-07-26 DIAGNOSIS — R7989 Other specified abnormal findings of blood chemistry: Secondary | ICD-10-CM | POA: Diagnosis not present

## 2018-07-26 DIAGNOSIS — I4439 Other atrioventricular block: Secondary | ICD-10-CM | POA: Diagnosis not present

## 2018-07-26 DIAGNOSIS — R627 Adult failure to thrive: Secondary | ICD-10-CM | POA: Diagnosis present

## 2018-07-26 DIAGNOSIS — I480 Paroxysmal atrial fibrillation: Secondary | ICD-10-CM | POA: Diagnosis present

## 2018-07-26 DIAGNOSIS — I214 Non-ST elevation (NSTEMI) myocardial infarction: Secondary | ICD-10-CM | POA: Diagnosis not present

## 2018-07-26 DIAGNOSIS — J9690 Respiratory failure, unspecified, unspecified whether with hypoxia or hypercapnia: Secondary | ICD-10-CM | POA: Diagnosis not present

## 2018-07-26 DIAGNOSIS — R1312 Dysphagia, oropharyngeal phase: Secondary | ICD-10-CM | POA: Diagnosis not present

## 2018-07-26 DIAGNOSIS — I499 Cardiac arrhythmia, unspecified: Secondary | ICD-10-CM | POA: Diagnosis not present

## 2018-07-26 DIAGNOSIS — I514 Myocarditis, unspecified: Secondary | ICD-10-CM | POA: Diagnosis not present

## 2018-07-26 DIAGNOSIS — G7000 Myasthenia gravis without (acute) exacerbation: Secondary | ICD-10-CM | POA: Diagnosis not present

## 2018-07-26 DIAGNOSIS — H05823 Myopathy of extraocular muscles, bilateral: Secondary | ICD-10-CM | POA: Diagnosis not present

## 2018-07-26 DIAGNOSIS — R29810 Facial weakness: Secondary | ICD-10-CM | POA: Diagnosis not present

## 2018-07-26 DIAGNOSIS — R21 Rash and other nonspecific skin eruption: Secondary | ICD-10-CM | POA: Diagnosis not present

## 2018-07-26 DIAGNOSIS — J150 Pneumonia due to Klebsiella pneumoniae: Secondary | ICD-10-CM | POA: Diagnosis not present

## 2018-07-26 DIAGNOSIS — I2583 Coronary atherosclerosis due to lipid rich plaque: Secondary | ICD-10-CM | POA: Diagnosis not present

## 2018-07-26 DIAGNOSIS — R471 Dysarthria and anarthria: Secondary | ICD-10-CM | POA: Diagnosis not present

## 2018-07-26 DIAGNOSIS — C801 Malignant (primary) neoplasm, unspecified: Secondary | ICD-10-CM | POA: Diagnosis not present

## 2018-07-26 DIAGNOSIS — L439 Lichen planus, unspecified: Secondary | ICD-10-CM | POA: Diagnosis not present

## 2018-07-26 DIAGNOSIS — I454 Nonspecific intraventricular block: Secondary | ICD-10-CM | POA: Diagnosis not present

## 2018-07-26 DIAGNOSIS — R0682 Tachypnea, not elsewhere classified: Secondary | ICD-10-CM | POA: Diagnosis not present

## 2018-08-09 MED ORDER — GENERIC EXTERNAL MEDICATION
1.00 | Status: DC
Start: ? — End: 2018-08-09

## 2018-08-09 MED ORDER — ONDANSETRON 4 MG PO TBDP
4.00 | ORAL_TABLET | ORAL | Status: DC
Start: ? — End: 2018-08-09

## 2018-08-09 MED ORDER — ALBUTEROL SULFATE (2.5 MG/3ML) 0.083% IN NEBU
2.50 | INHALATION_SOLUTION | RESPIRATORY_TRACT | Status: DC
Start: ? — End: 2018-08-09

## 2018-08-09 MED ORDER — GLYCOPYRROLATE 0.2 MG/ML IJ SOLN
0.20 | INTRAMUSCULAR | Status: DC
Start: ? — End: 2018-08-09

## 2018-08-09 MED ORDER — BISACODYL 10 MG RE SUPP
10.00 | RECTAL | Status: DC
Start: ? — End: 2018-08-09

## 2018-08-09 MED ORDER — MORPHINE SULFATE 4 MG/ML IJ SOLN
2.00 | INTRAMUSCULAR | Status: DC
Start: ? — End: 2018-08-09

## 2018-08-09 MED ORDER — POLYETHYLENE GLYCOL 3350 17 G PO PACK
17.00 | PACK | ORAL | Status: DC
Start: ? — End: 2018-08-09

## 2018-08-09 MED ORDER — GENERIC EXTERNAL MEDICATION
2.00 | Status: DC
Start: ? — End: 2018-08-09

## 2018-08-09 MED ORDER — HYDROMORPHONE HCL PF 1 MG/ML IJ SOLN
0.20 | INTRAMUSCULAR | Status: DC
Start: ? — End: 2018-08-09

## 2018-08-09 MED ORDER — LORAZEPAM 2 MG/ML IJ SOLN
2.00 | INTRAMUSCULAR | Status: DC
Start: ? — End: 2018-08-09

## 2018-08-09 MED ORDER — GENERIC EXTERNAL MEDICATION
30.00 | Status: DC
Start: ? — End: 2018-08-09

## 2018-08-14 DEATH — deceased

## 2018-08-15 ENCOUNTER — Telehealth: Payer: Self-pay | Admitting: Pulmonary Disease

## 2018-08-15 NOTE — Telephone Encounter (Signed)
Called and spoke with Patient's Wife, Barbaraann Share.  She was calling to let Dr. Lenna Gilford know that Sanchez had passed away at [facility] 09-07-18 at 4:37pm.

## 2018-08-22 ENCOUNTER — Telehealth: Payer: Self-pay

## 2018-08-22 NOTE — Telephone Encounter (Signed)
Copied from [employer] [id number]. Topic: General - Deceased Patient >> September 17, 2018  3:28 PM Berneta Levins wrote: Reason for CRM:  Pt's wife called and states that pt passed away 09-04-2018 from cancer.  Route to department's PEC Pool.

## 2018-08-22 NOTE — Telephone Encounter (Signed)
Pt had NP appt scheduled 09/2018.

## 2018-08-22 NOTE — Telephone Encounter (Signed)
appt canceled

## 2018-09-21 ENCOUNTER — Ambulatory Visit: Payer: Medicare Other | Admitting: Family Medicine
